# Patient Record
Sex: Female | Born: 1975 | Race: White | Hispanic: No | Smoking: Never smoker
Health system: Southern US, Community
[De-identification: ages and names within clinical notes are randomized; demographics above are authoritative.]

## PROBLEM LIST (undated history)

## (undated) DIAGNOSIS — H3530 Unspecified macular degeneration: Secondary | ICD-10-CM

## (undated) DIAGNOSIS — K589 Irritable bowel syndrome without diarrhea: Secondary | ICD-10-CM

## (undated) DIAGNOSIS — N251 Nephrogenic diabetes insipidus: Secondary | ICD-10-CM

## (undated) DIAGNOSIS — F419 Anxiety disorder, unspecified: Secondary | ICD-10-CM

## (undated) DIAGNOSIS — E876 Hypokalemia: Secondary | ICD-10-CM

## (undated) DIAGNOSIS — N189 Chronic kidney disease, unspecified: Secondary | ICD-10-CM

## (undated) DIAGNOSIS — C801 Malignant (primary) neoplasm, unspecified: Secondary | ICD-10-CM

## (undated) DIAGNOSIS — H5440 Blindness, one eye, unspecified eye: Secondary | ICD-10-CM

## (undated) DIAGNOSIS — M549 Dorsalgia, unspecified: Secondary | ICD-10-CM

## (undated) DIAGNOSIS — R519 Headache, unspecified: Secondary | ICD-10-CM

## (undated) DIAGNOSIS — Z940 Kidney transplant status: Secondary | ICD-10-CM

## (undated) DIAGNOSIS — C4442 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: Secondary | ICD-10-CM

## (undated) DIAGNOSIS — N904 Leukoplakia of vulva: Secondary | ICD-10-CM

## (undated) DIAGNOSIS — K2900 Acute gastritis without bleeding: Secondary | ICD-10-CM

## (undated) DIAGNOSIS — F32A Depression, unspecified: Secondary | ICD-10-CM

## (undated) DIAGNOSIS — I10 Essential (primary) hypertension: Secondary | ICD-10-CM

## (undated) DIAGNOSIS — Z992 Dependence on renal dialysis: Secondary | ICD-10-CM

## (undated) DIAGNOSIS — K859 Acute pancreatitis without necrosis or infection, unspecified: Secondary | ICD-10-CM

## (undated) DIAGNOSIS — R5383 Other fatigue: Secondary | ICD-10-CM

## (undated) DIAGNOSIS — D631 Anemia in chronic kidney disease: Secondary | ICD-10-CM

## (undated) DIAGNOSIS — M109 Gout, unspecified: Secondary | ICD-10-CM

## (undated) DIAGNOSIS — D649 Anemia, unspecified: Secondary | ICD-10-CM

## (undated) DIAGNOSIS — E872 Acidosis, unspecified: Secondary | ICD-10-CM

## (undated) DIAGNOSIS — R933 Abnormal findings on diagnostic imaging of other parts of digestive tract: Secondary | ICD-10-CM

## (undated) DIAGNOSIS — E7800 Pure hypercholesterolemia, unspecified: Secondary | ICD-10-CM

## (undated) DIAGNOSIS — E785 Hyperlipidemia, unspecified: Secondary | ICD-10-CM

## (undated) DIAGNOSIS — R600 Localized edema: Secondary | ICD-10-CM

## (undated) DIAGNOSIS — N390 Urinary tract infection, site not specified: Secondary | ICD-10-CM

## (undated) DIAGNOSIS — K6282 Dysplasia of anus: Secondary | ICD-10-CM

## (undated) DIAGNOSIS — A770 Spotted fever due to Rickettsia rickettsii: Secondary | ICD-10-CM

## (undated) DIAGNOSIS — R002 Palpitations: Secondary | ICD-10-CM

## (undated) DIAGNOSIS — A630 Anogenital (venereal) warts: Secondary | ICD-10-CM

## (undated) DIAGNOSIS — R300 Dysuria: Secondary | ICD-10-CM

## (undated) DIAGNOSIS — N2581 Secondary hyperparathyroidism of renal origin: Secondary | ICD-10-CM

## (undated) DIAGNOSIS — D0730 Carcinoma in situ of unspecified female genital organs: Secondary | ICD-10-CM

## (undated) HISTORY — DX: Kidney transplant status: Z94.0

## (undated) HISTORY — PX: REMOVAL OF A DIALYSIS CATHETER: SHX6053

## (undated) HISTORY — DX: Chronic kidney disease, unspecified: N18.9

## (undated) HISTORY — PX: OTHER SURGICAL HISTORY: SHX169

## (undated) HISTORY — DX: Secondary hyperparathyroidism of renal origin: N25.81

## (undated) HISTORY — DX: Gout, unspecified: M10.9

## (undated) HISTORY — PX: KIDNEY TRANSPLANT: SHX239

## (undated) HISTORY — PX: UPPER GASTROINTESTINAL ENDOSCOPY: SHX188

## (undated) HISTORY — DX: Hypomagnesemia: E83.42

## (undated) HISTORY — DX: Anxiety disorder, unspecified: F41.9

## (undated) HISTORY — DX: Pure hypercholesterolemia, unspecified: E78.00

## (undated) HISTORY — DX: Spotted fever due to Rickettsia rickettsii: A77.0

## (undated) HISTORY — DX: Other fatigue: R53.83

## (undated) HISTORY — DX: Hyperlipidemia, unspecified: E78.5

## (undated) HISTORY — DX: Urinary tract infection, site not specified: N39.0

## (undated) HISTORY — PX: WISDOM TOOTH EXTRACTION: SHX21

## (undated) HISTORY — DX: Localized edema: R60.0

## (undated) HISTORY — PX: INSERTION OF DIALYSIS CATHETER: SHX1324

## (undated) HISTORY — DX: Acidosis, unspecified: E87.20

## (undated) HISTORY — PX: LIVER BIOPSY: SHX301

## (undated) HISTORY — DX: Essential (primary) hypertension: I10

## (undated) HISTORY — DX: 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: C44.42

## (undated) HISTORY — DX: End stage renal disease: N18.6

## (undated) HISTORY — DX: Hypercalcemia: E83.52

## (undated) HISTORY — DX: Acute pancreatitis without necrosis or infection, unspecified: K85.90

## (undated) HISTORY — DX: Dorsalgia, unspecified: M54.9

## (undated) HISTORY — DX: Acute gastritis without bleeding: K29.00

## (undated) HISTORY — DX: Depression, unspecified: F32.A

## (undated) HISTORY — PX: HEMORRHOID SURGERY: SHX153

## (undated) HISTORY — DX: Malignant (primary) neoplasm, unspecified: C80.1

## (undated) HISTORY — PX: TUBAL LIGATION: SHX77

## (undated) HISTORY — DX: Nephrogenic diabetes insipidus: N25.1

## (undated) HISTORY — PX: NECK SURGERY: SHX720

## (undated) HISTORY — DX: Anemia, unspecified: D64.9

## (undated) HISTORY — DX: Palpitations: R00.2

## (undated) HISTORY — DX: Anemia in chronic kidney disease: D63.1

## (undated) HISTORY — DX: Irritable bowel syndrome, unspecified: K58.9

## (undated) HISTORY — DX: Carcinoma in situ of unspecified female genital organs: D07.30

## (undated) HISTORY — DX: Dysuria: R30.0

## (undated) HISTORY — DX: Abnormal findings on diagnostic imaging of other parts of digestive tract: R93.3

## (undated) HISTORY — DX: Dependence on renal dialysis: Z99.2

## (undated) HISTORY — DX: Dysplasia of anus: K62.82

## (undated) HISTORY — DX: Anogenital (venereal) warts: A63.0

## (undated) HISTORY — DX: Leukoplakia of vulva: N90.4

## (undated) HISTORY — DX: Hypokalemia: E87.6

---

## 1999-11-29 ENCOUNTER — Ambulatory Visit: Admission: RE | Admit: 1999-11-29 | Discharge: 1999-11-29 | Payer: Self-pay | Admitting: Gynecology

## 2000-01-16 ENCOUNTER — Ambulatory Visit (HOSPITAL_COMMUNITY): Admission: RE | Admit: 2000-01-16 | Discharge: 2000-01-16 | Payer: Self-pay

## 2000-03-22 ENCOUNTER — Ambulatory Visit (HOSPITAL_COMMUNITY): Admission: RE | Admit: 2000-03-22 | Discharge: 2000-03-22 | Payer: Self-pay | Admitting: Nephrology

## 2000-03-29 ENCOUNTER — Encounter: Payer: Self-pay | Admitting: Nephrology

## 2000-03-29 ENCOUNTER — Ambulatory Visit (HOSPITAL_COMMUNITY): Admission: RE | Admit: 2000-03-29 | Discharge: 2000-03-29 | Payer: Self-pay | Admitting: Nephrology

## 2000-04-05 ENCOUNTER — Encounter: Payer: Self-pay | Admitting: Nephrology

## 2000-04-05 ENCOUNTER — Ambulatory Visit (HOSPITAL_COMMUNITY): Admission: RE | Admit: 2000-04-05 | Discharge: 2000-04-05 | Payer: Self-pay | Admitting: Nephrology

## 2000-04-08 ENCOUNTER — Encounter: Payer: Self-pay | Admitting: Urology

## 2000-04-09 ENCOUNTER — Inpatient Hospital Stay (HOSPITAL_COMMUNITY): Admission: RE | Admit: 2000-04-09 | Discharge: 2000-04-13 | Payer: Self-pay | Admitting: Urology

## 2000-04-10 ENCOUNTER — Encounter: Payer: Self-pay | Admitting: Nephrology

## 2000-05-14 ENCOUNTER — Ambulatory Visit: Admission: RE | Admit: 2000-05-14 | Discharge: 2000-05-14 | Payer: Self-pay | Admitting: Gynecology

## 2000-05-23 ENCOUNTER — Ambulatory Visit (HOSPITAL_COMMUNITY): Admission: RE | Admit: 2000-05-23 | Discharge: 2000-05-23 | Payer: Self-pay | Admitting: Nephrology

## 2000-06-24 ENCOUNTER — Emergency Department (HOSPITAL_COMMUNITY): Admission: EM | Admit: 2000-06-24 | Discharge: 2000-06-24 | Payer: Self-pay | Admitting: *Deleted

## 2000-07-03 ENCOUNTER — Ambulatory Visit (HOSPITAL_COMMUNITY): Admission: RE | Admit: 2000-07-03 | Discharge: 2000-07-04 | Payer: Self-pay | Admitting: General Surgery

## 2000-08-21 ENCOUNTER — Ambulatory Visit (HOSPITAL_COMMUNITY): Admission: RE | Admit: 2000-08-21 | Discharge: 2000-08-21 | Payer: Self-pay | Admitting: Nephrology

## 2000-09-05 ENCOUNTER — Emergency Department (HOSPITAL_COMMUNITY): Admission: EM | Admit: 2000-09-05 | Discharge: 2000-09-05 | Payer: Self-pay | Admitting: Emergency Medicine

## 2001-08-12 ENCOUNTER — Emergency Department (HOSPITAL_COMMUNITY): Admission: EM | Admit: 2001-08-12 | Discharge: 2001-08-12 | Payer: Self-pay | Admitting: General Surgery

## 2001-08-13 ENCOUNTER — Inpatient Hospital Stay (HOSPITAL_COMMUNITY): Admission: RE | Admit: 2001-08-13 | Discharge: 2001-08-15 | Payer: Self-pay | Admitting: Nephrology

## 2001-08-15 ENCOUNTER — Encounter: Payer: Self-pay | Admitting: Nephrology

## 2001-08-18 ENCOUNTER — Emergency Department (HOSPITAL_COMMUNITY): Admission: EM | Admit: 2001-08-18 | Discharge: 2001-08-18 | Payer: Self-pay | Admitting: Emergency Medicine

## 2001-09-23 ENCOUNTER — Observation Stay (HOSPITAL_COMMUNITY): Admission: EM | Admit: 2001-09-23 | Discharge: 2001-09-23 | Payer: Self-pay | Admitting: *Deleted

## 2001-11-10 ENCOUNTER — Encounter: Payer: Self-pay | Admitting: General Surgery

## 2001-11-12 ENCOUNTER — Inpatient Hospital Stay (HOSPITAL_COMMUNITY): Admission: RE | Admit: 2001-11-12 | Discharge: 2001-11-12 | Payer: Self-pay | Admitting: General Surgery

## 2001-12-04 ENCOUNTER — Ambulatory Visit (HOSPITAL_COMMUNITY): Admission: RE | Admit: 2001-12-04 | Discharge: 2001-12-04 | Payer: Self-pay | Admitting: General Surgery

## 2002-09-03 HISTORY — PX: PARATHYROIDECTOMY: SHX19

## 2003-01-07 ENCOUNTER — Encounter: Payer: Self-pay | Admitting: Surgery

## 2003-01-12 ENCOUNTER — Inpatient Hospital Stay (HOSPITAL_COMMUNITY): Admission: RE | Admit: 2003-01-12 | Discharge: 2003-01-15 | Payer: Self-pay | Admitting: Surgery

## 2003-10-06 ENCOUNTER — Encounter: Admission: RE | Admit: 2003-10-06 | Discharge: 2003-10-06 | Payer: Self-pay | Admitting: General Surgery

## 2003-10-08 ENCOUNTER — Encounter: Admission: RE | Admit: 2003-10-08 | Discharge: 2003-10-08 | Payer: Self-pay | Admitting: General Surgery

## 2003-10-11 ENCOUNTER — Ambulatory Visit (HOSPITAL_COMMUNITY): Admission: RE | Admit: 2003-10-11 | Discharge: 2003-10-11 | Payer: Self-pay | Admitting: General Surgery

## 2003-10-11 ENCOUNTER — Ambulatory Visit (HOSPITAL_BASED_OUTPATIENT_CLINIC_OR_DEPARTMENT_OTHER): Admission: RE | Admit: 2003-10-11 | Discharge: 2003-10-11 | Payer: Self-pay | Admitting: General Surgery

## 2004-12-01 ENCOUNTER — Ambulatory Visit (HOSPITAL_COMMUNITY): Admission: RE | Admit: 2004-12-01 | Discharge: 2004-12-01 | Payer: Self-pay | Admitting: Dentistry

## 2004-12-01 ENCOUNTER — Ambulatory Visit (HOSPITAL_BASED_OUTPATIENT_CLINIC_OR_DEPARTMENT_OTHER): Admission: RE | Admit: 2004-12-01 | Discharge: 2004-12-01 | Payer: Self-pay | Admitting: Dentistry

## 2005-09-25 ENCOUNTER — Encounter: Admission: RE | Admit: 2005-09-25 | Discharge: 2005-09-25 | Payer: Self-pay | Admitting: Nephrology

## 2005-11-25 ENCOUNTER — Ambulatory Visit: Payer: Self-pay | Admitting: Gastroenterology

## 2005-11-25 ENCOUNTER — Inpatient Hospital Stay (HOSPITAL_COMMUNITY): Admission: EM | Admit: 2005-11-25 | Discharge: 2005-11-26 | Payer: Self-pay | Admitting: Nephrology

## 2005-11-28 ENCOUNTER — Ambulatory Visit: Payer: Self-pay | Admitting: Gastroenterology

## 2005-11-28 HISTORY — PX: COLONOSCOPY: SHX174

## 2005-12-18 ENCOUNTER — Ambulatory Visit: Payer: Self-pay | Admitting: Gastroenterology

## 2005-12-21 ENCOUNTER — Ambulatory Visit: Payer: Self-pay | Admitting: Gastroenterology

## 2005-12-27 ENCOUNTER — Ambulatory Visit (HOSPITAL_COMMUNITY): Admission: RE | Admit: 2005-12-27 | Discharge: 2005-12-27 | Payer: Self-pay | Admitting: Gastroenterology

## 2006-03-11 ENCOUNTER — Inpatient Hospital Stay (HOSPITAL_COMMUNITY): Admission: EM | Admit: 2006-03-11 | Discharge: 2006-03-13 | Payer: Self-pay | Admitting: *Deleted

## 2006-03-25 ENCOUNTER — Emergency Department (HOSPITAL_COMMUNITY): Admission: EM | Admit: 2006-03-25 | Discharge: 2006-03-25 | Payer: Self-pay | Admitting: Emergency Medicine

## 2006-03-26 ENCOUNTER — Inpatient Hospital Stay (HOSPITAL_COMMUNITY): Admission: EM | Admit: 2006-03-26 | Discharge: 2006-03-27 | Payer: Self-pay | Admitting: Pediatrics

## 2006-05-05 ENCOUNTER — Emergency Department (HOSPITAL_COMMUNITY): Admission: EM | Admit: 2006-05-05 | Discharge: 2006-05-05 | Payer: Self-pay | Admitting: Emergency Medicine

## 2006-05-13 ENCOUNTER — Ambulatory Visit (HOSPITAL_COMMUNITY): Admission: RE | Admit: 2006-05-13 | Discharge: 2006-05-13 | Payer: Self-pay | Admitting: General Surgery

## 2006-08-16 ENCOUNTER — Emergency Department (HOSPITAL_COMMUNITY): Admission: EM | Admit: 2006-08-16 | Discharge: 2006-08-16 | Payer: Self-pay | Admitting: Emergency Medicine

## 2006-09-20 ENCOUNTER — Emergency Department (HOSPITAL_COMMUNITY): Admission: EM | Admit: 2006-09-20 | Discharge: 2006-09-20 | Payer: Self-pay | Admitting: Emergency Medicine

## 2006-09-23 ENCOUNTER — Ambulatory Visit (HOSPITAL_COMMUNITY): Admission: RE | Admit: 2006-09-23 | Discharge: 2006-09-23 | Payer: Self-pay | Admitting: Vascular Surgery

## 2006-10-06 ENCOUNTER — Inpatient Hospital Stay (HOSPITAL_COMMUNITY): Admission: EM | Admit: 2006-10-06 | Discharge: 2006-10-06 | Payer: Self-pay | Admitting: Emergency Medicine

## 2010-09-24 ENCOUNTER — Encounter: Payer: Self-pay | Admitting: Gastroenterology

## 2011-06-13 ENCOUNTER — Other Ambulatory Visit (HOSPITAL_COMMUNITY)
Admission: RE | Admit: 2011-06-13 | Discharge: 2011-06-13 | Disposition: A | Discharge status: Home / Self Care | Payer: Medicare Other | Source: Ambulatory Visit | Attending: Gynecology | Admitting: Gynecology

## 2011-06-13 ENCOUNTER — Ambulatory Visit: Payer: Medicare Other | Attending: Gynecology | Admitting: Gynecology

## 2011-06-13 ENCOUNTER — Other Ambulatory Visit: Payer: Self-pay | Admitting: Gynecology

## 2011-06-13 DIAGNOSIS — Z8540 Personal history of malignant neoplasm of unspecified female genital organ: Secondary | ICD-10-CM | POA: Insufficient documentation

## 2011-06-13 DIAGNOSIS — N9089 Other specified noninflammatory disorders of vulva and perineum: Secondary | ICD-10-CM | POA: Insufficient documentation

## 2011-06-13 DIAGNOSIS — Z79899 Other long term (current) drug therapy: Secondary | ICD-10-CM | POA: Insufficient documentation

## 2011-06-15 ENCOUNTER — Other Ambulatory Visit: Payer: Self-pay | Admitting: Gynecology

## 2011-06-15 ENCOUNTER — Encounter (HOSPITAL_COMMUNITY): Payer: Medicare Other

## 2011-06-15 LAB — DIFFERENTIAL
Basophils Relative: 0 % (ref 0–1)
Eosinophils Absolute: 0.1 10*3/uL (ref 0.0–0.7)
Neutro Abs: 8.3 10*3/uL — ABNORMAL HIGH (ref 1.7–7.7)
Neutrophils Relative %: 83 % — ABNORMAL HIGH (ref 43–77)

## 2011-06-15 LAB — CBC
HCT: 37.1 % (ref 36.0–46.0)
Hemoglobin: 11.9 g/dL — ABNORMAL LOW (ref 12.0–15.0)
MCHC: 32.1 g/dL (ref 30.0–36.0)
RBC: 4.18 MIL/uL (ref 3.87–5.11)
WBC: 10.1 10*3/uL (ref 4.0–10.5)

## 2011-06-15 LAB — BASIC METABOLIC PANEL
Calcium: 10.4 mg/dL (ref 8.4–10.5)
GFR calc Af Amer: >90 mL/min (ref >90)
Potassium: 4 mEq/L (ref 3.5–5.1)
Sodium: 136 mEq/L (ref 135–145)

## 2011-06-15 LAB — HCG, SERUM, QUALITATIVE: Preg, Serum: NEGATIVE

## 2011-06-15 NOTE — Consult Note (Signed)
Ariel [name], Ariel [name]           ACCOUNT NO.:  [id number]  MEDICAL RECORD NO.:  [id number]  LOCATION:  GYN                          FACILITY:  [facility]  PHYSICIAN:  Marti Sleigh, M.D.DATE OF BIRTH:  1976/08/12  DATE OF CONSULTATION:  06/13/2011 DATE OF DISCHARGE:                                CONSULTATION   CHIEF COMPLAINT:  Severe dysplasia of the vulva.  HISTORY OF PRESENT ILLNESS:  A 35 year old white female seen in consultation requested by Dr. Marin Roberts regarding newly diagnosed severe dysplasia of the vulva.  The patient has had a past chronic history of condylomata, but recently was found to have an asymptomatic lesion on the vulva, which Dr. Marin Roberts biopsied.  The patient specifically denies any pruritus.  She has recently been using Aldara for treatment of vulvar and perianal condylomata and has been tolerating that well.  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  She has had renal transplants in 1995 and again in 2008 and she has also has a significant amount of time requiring dialysis.  The patient also has hypercholesterolemia and congenital nephrogenic diabetes insipidus.  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  FAMILY HISTORY:  Negative for gynecologic, breast, or colon cancer.  REVIEW OF SYSTEMS:  Ten-point comprehensive review of systems negative except as noted above.  CURRENT MEDICATIONS:  Prograf, Myfortic, simvastatin, Zoloft, magnesium, buspirone, prednisone, alprazolam, Aldara.  PHYSICAL EXAMINATION:  VITAL SIGNS:  Weight 148 pounds.  Remainder of vital signs are in the patient's chart. GENERAL:  Patient is a healthy, pleasant,  white female with a moonlike facies associated with the use of prednisone. HEENT:  Negative. NECK:  Supple without thyromegaly.  There is no supraclavicular or inguinal adenopathy. ABDOMEN:  Obese, soft, nontender.  No mass, organomegaly, ascites, or hernias noted. PELVIC EXAM:  EGBUS showed some condylomata on the left side of the clitoris and along the labia minora anteriorly.  In the mid portion of the left labia majora, there is a slightly raised up plaque, measured approximately 2 cm in diameter.  Ascetic acid is applied to this area for several minutes and then removed and there was no evidence of white epithelium and it is remarkable that this does not demarcated itself very clearly.  Vagina is normal.  Cervix is normal.  Pap smears have obtained.  Bimanual exam reveals an anterior uterus, normal shape, size, and consistency.  Adnexa without masses.  IMPRESSION:  Carcinoma in situ of the vulva.  I had a lengthy discussion with the patient and her mother regarding treatment options, which could include excision, laser vaporization, or use of Efudex cream.  After discussing pros and cons of each, we decided to go ahead with a wide local excision and primary closure.  The risks of surgery were outlined as well as the risk of recurrence, this was especially emphasized given the fact the patient is on immunosuppressive drugs.  All of their questions were answered.  We will schedule surgery for  the next week.     Marti Sleigh, M.D.     DC/MEDQ  D:  06/13/2011  T:  06/13/2011  Job:  [id number]  cc:   Armandina Stammer, MD Fax: [phone]  Caswell Corwin, R.N. [phone], [place] [address]  Electronically Signed by Marti Sleigh M.D. on 06/15/2011 11:10:18 AM

## 2011-06-19 ENCOUNTER — Other Ambulatory Visit: Payer: Self-pay | Admitting: Gynecology

## 2011-06-19 ENCOUNTER — Ambulatory Visit (HOSPITAL_COMMUNITY)
Admission: RE | Admit: 2011-06-19 | Discharge: 2011-06-19 | Disposition: A | Discharge status: Home / Self Care | Payer: Medicare Other | Source: Ambulatory Visit | Attending: Gynecology | Admitting: Gynecology

## 2011-06-19 DIAGNOSIS — D071 Carcinoma in situ of vulva: Secondary | ICD-10-CM | POA: Insufficient documentation

## 2011-06-19 DIAGNOSIS — F411 Generalized anxiety disorder: Secondary | ICD-10-CM | POA: Insufficient documentation

## 2011-06-19 DIAGNOSIS — Z01812 Encounter for preprocedural laboratory examination: Secondary | ICD-10-CM | POA: Insufficient documentation

## 2011-06-19 DIAGNOSIS — IMO0002 Reserved for concepts with insufficient information to code with codable children: Secondary | ICD-10-CM | POA: Insufficient documentation

## 2011-06-19 HISTORY — PX: SIMPLE VULVECTOMY: SUR1442

## 2011-06-25 NOTE — Op Note (Signed)
  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  PROCEDURE:  Partial simple vulvectomy (left).  SURGEON:  Marti Sleigh, M.D.  ASSISTANT:  Caswell Corwin, R.N.  ANESTHESIA:  General, LMA.  ESTIMATED BLOOD LOSS:  Minimal.  SURGICAL FINDINGS:  Examination under anesthesia revealed some hyperpigmentation of the left vulva as well as a biopsy site, which previously documented the carcinoma in situ.  After applying acetic acid, the biopsy site appeared somewhat more white.  There were no other focal lesions, although there was some white epithelium on the medial side of the labia minora.  PROCEDURE:  Patient brought to the operating room and after satisfactory attainment, general anesthesia, was placed in modified lithotomy position in [facility].  Perineum and vulva and vagina were prepped with Betadine.  The patient was draped.  Acetic acid was applied to the vulva with the above-noted findings.  Creating an elliptical incision with approximately 1 cm margin around the obvious lesion, the large portion of the left labia majora and minora were removed.  We made every attempt to remove the skin only.  Hemostasis achieved with cautery.  The skin edges were then reapproximated with a running subcuticular suture of 3-0 Vicryl.  The incision was reinforced with a few interrupted 3-0 Vicryl sutures.  Hemostasis was excellent.  An ice pack was applied.  Patient was awakened from anesthesia and taken to the recovery room in satisfactory condition. Sponge, needle, and  instrument counts were correct x2.     Marti Sleigh, M.D.     DC/MEDQ  D:  06/19/2011  T:  06/20/2011  Job:  [id number]  cc:   Caswell Corwin, R.N. [address], [place] [address]  Armandina Stammer, MD Fax: [phone]  Electronically Signed by Marti Sleigh M.D. on 06/25/2011 04:17:27 PM

## 2011-07-04 ENCOUNTER — Ambulatory Visit: Payer: Medicare Other | Admitting: Gynecology

## 2011-07-04 ENCOUNTER — Ambulatory Visit: Payer: Medicare Other | Attending: Gynecology | Admitting: Gynecology

## 2011-07-04 DIAGNOSIS — T8131XA Disruption of external operation (surgical) wound, not elsewhere classified, initial encounter: Secondary | ICD-10-CM | POA: Insufficient documentation

## 2011-07-04 DIAGNOSIS — Y838 Other surgical procedures as the cause of abnormal reaction of the patient, or of later complication, without mention of misadventure at the time of the procedure: Secondary | ICD-10-CM | POA: Insufficient documentation

## 2011-07-04 DIAGNOSIS — Z09 Encounter for follow-up examination after completed treatment for conditions other than malignant neoplasm: Secondary | ICD-10-CM | POA: Insufficient documentation

## 2011-07-06 NOTE — Consult Note (Signed)
  Ariel [name], [name]           ACCOUNT NO.:  [id number]  MEDICAL RECORD NO.:  [id number]  LOCATION:  GYN                          FACILITY:  [facility]  PHYSICIAN:  Marti Sleigh, M.D.DATE OF BIRTH:  [birth year]  DATE OF CONSULTATION:  07/04/2011 DATE OF DISCHARGE:                                CONSULTATION   CHIEF COMPLAINT:  Postoperative followup.  INTERVAL HISTORY:  Patient underwent a partial simple vulvectomy of the left vulva on October 16th.  Final pathology showed that she had severe dysplasia with negative margins.  Unfortunately, because of the size of the lesion, a primary closure has pulled through and the patient is currently using sitz baths and applying Neosporin to the area. Otherwise, she is not having any fever, chills, or any other problems.  PHYSICAL EXAMINATION:  VITAL SIGNS:  Weight 147 pounds, blood pressure 90/68.  PELVIC EXAM:  EGBUS shows that the left vulva has a clean granulating area where the sutures were pulled through, measuring approximately 6 x 4 cm.  There is no evidence of erythema or infection.  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  Wound breakdown.  PLAN:  The patient continues b.i.d. sitz baths, applying Neosporin, and have her return to see me in 1 month or p.r.n.     Marti Sleigh, M.D.     DC/MEDQ  D:  07/04/2011  T:  07/04/2011  Job:  [id number]  cc:   Caswell Corwin, R.N. [address], [place] [address]  Ariel Stammer, MD Fax: [phone]  Electronically Signed by Marti Sleigh M.D. on 07/06/2011 09:25:10 AM

## 2011-08-30 ENCOUNTER — Encounter: Payer: Self-pay | Admitting: Gynecologic Oncology

## 2011-08-31 ENCOUNTER — Encounter: Payer: Self-pay | Admitting: Gynecology

## 2011-08-31 ENCOUNTER — Ambulatory Visit: Payer: Medicare Other | Attending: Gynecology | Admitting: Gynecology

## 2011-08-31 VITALS — BP 110/60 | HR 68 | Temp 98.3°F | Resp 16 | Ht 59.0 in | Wt 143.4 lb

## 2011-08-31 DIAGNOSIS — D071 Carcinoma in situ of vulva: Secondary | ICD-10-CM | POA: Insufficient documentation

## 2011-08-31 DIAGNOSIS — D4959 Neoplasm of unspecified behavior of other genitourinary organ: Secondary | ICD-10-CM

## 2011-08-31 DIAGNOSIS — N189 Chronic kidney disease, unspecified: Secondary | ICD-10-CM | POA: Insufficient documentation

## 2011-08-31 DIAGNOSIS — A630 Anogenital (venereal) warts: Secondary | ICD-10-CM | POA: Insufficient documentation

## 2011-08-31 DIAGNOSIS — Z940 Kidney transplant status: Secondary | ICD-10-CM | POA: Insufficient documentation

## 2011-08-31 DIAGNOSIS — E7800 Pure hypercholesterolemia, unspecified: Secondary | ICD-10-CM | POA: Insufficient documentation

## 2011-08-31 DIAGNOSIS — Z79899 Other long term (current) drug therapy: Secondary | ICD-10-CM | POA: Insufficient documentation

## 2011-08-31 NOTE — Progress Notes (Signed)
Consult Note: Gyn-Onc   Ariel [name] 35 y.o. female  Chief Complaint  Patient presents with  . Vulvar Cancer    Follow up    Interval History: Since her last visit she is done well. The area of wound separation is now completely healed she only notices that she has a new condylomata on her left anterior labia minora as well as some around her anus. She's using Aldara to treat this.  HPI:A 35 year old white female seen in  consultation requested by Dr. Marin Roberts regarding newly diagnosed severe  dysplasia of the vulva.  The patient has had a past chronic history of condylomata, but recently  was found to have an asymptomatic lesion on the vulva, which Dr.  Marin Roberts biopsied. The patient specifically denies any pruritus. She  has recently been using Aldara for treatment of vulvar and perianal  condylomata and has been tolerating that well. 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  Margins were negative.   Allergies  Allergen Reactions  . Adhesive (Tape) Rash  . Bactrim Swelling and Rash    Past Medical History  Diagnosis Date  . Hypercholesterolemia   . 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 Vulvar dystrophy   . History of renal transplant   . Nephrogenic diabetes insipidus     congenital  . Renal failure, chronic     Dialysis in the past  . 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 Kidney transplant 1995, 2008  . Tubal ligation   . Av fistula placement   . Simple vulvectomy 06/19/2011    Partial simple left  . 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 ALPRAZolam (XANAX) 0.25 MG tablet Take 0.25 mg by mouth at bedtime as needed.        . imiquimod (ALDARA) 5 % cream Apply topically. Apply to affected area in the evening on Tues, Thurs., and  Sat. Wash off in the morning       . Magnesium 200 MG TABS Take 1 tablet by mouth daily.        . mycophenolate (MYFORTIC) 360 MG TBEC Take 360 mg by mouth 2 (two) times daily.        . predniSONE (DELTASONE) 5 MG tablet Take 5 mg by mouth daily.        . sertraline (ZOLOFT) 50 MG tablet Take 50 mg by mouth daily.        . simvastatin (ZOCOR) 10 MG tablet Take 10 mg by mouth daily.       . tacrolimus (PROGRAF) 1 MG capsule Take 4 mg by mouth 2 (two) times daily.        . busPIRone (BUSPAR) 5 MG tablet Take 5 mg by mouth 3 (three) times daily.          History   Social History  . Marital Status: Divorced    Spouse Name: N/A    Number of Children: N/A  . Years of Education: N/A   Occupational History  . Not on file.   Social History Main Topics  . Smoking status: Never Smoker   . Smokeless tobacco: Not on file  . Alcohol Use: No  . Drug Use: No  . Sexually Active:    Other Topics  Concern  . Not on file   Social History Narrative  . No narrative on file    Family History  Problem Relation Age of Onset  . Hypertension Mother     Review of Systems: 10 point review of systems is negative except as noted above  Vitals: Blood pressure 110/60, pulse 68, temperature 98.3 F (36.8 C), resp. rate 16, height 4\' 11"  (1.499 m), weight 143 lb 6.4 oz (65.046 kg), last menstrual period 07/09/2011.  Physical Exam: In general is a healthy white female no acute distress  HEENT is negative  Neck supple without thyromegaly.  There is no supra-auricular or inguinal adenopathy  The abdomen is soft nontender no masses organomegaly ascites or hernias are noted.  Pelvic exam EGBUS shows that the open wound in the perineum has completely healed. There are no lesions that appear to be carcinoma in situ. On the other hand, there is a 5-6 mm exophytic condyloma on the anterior aspect of the left labia minora. In addition there are several verrucous appearing condyloma around the  anus. Assessment/Plan:  Carcinoma in situ the vulva status post wide local excision with negative margins. No evidence of recurrent disease.  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  The patient will return to see Dr. Marin Roberts in approximately one month for reassessment of the condylomata. She's currently using Aldara to treat the condylomata. Treatment options were discussed with the patient including use of topical Efudex or laser vaporization of the condylomata should Aldara not be effective.   Alvino Chapel, MD 08/31/2011, 2:05 PM                         Consult Note: Gyn-Onc   Ariel [name] 35 y.o. female  Chief Complaint  Patient presents with  . Vulvar Cancer    Follow up    Interval History:   HPI:  Allergies  Allergen Reactions  . Adhesive (Tape) Rash  . Bactrim Swelling and Rash    Past Medical History  Diagnosis Date  . Hypercholesterolemia   . 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 Vulvar dystrophy   . History of renal transplant   . Nephrogenic diabetes insipidus     congenital  . Renal failure, chronic     Dialysis in the past  . 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 Kidney transplant 1995, 2008  . Tubal ligation   . Av fistula placement   . Simple vulvectomy 06/19/2011    Partial simple left  . 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 ALPRAZolam (XANAX) 0.25 MG tablet Take 0.25 mg by mouth at bedtime as needed.        . imiquimod (ALDARA) 5 % cream Apply topically. Apply to affected area in the evening on Tues, Thurs., and Sat. Wash off in the morning       . Magnesium 200 MG TABS Take 1 tablet by mouth daily.        . mycophenolate (MYFORTIC) 360 MG TBEC Take 360 mg by mouth 2 (two) times daily.        .  predniSONE (DELTASONE) 5 MG tablet Take 5 mg by mouth daily.        . sertraline (ZOLOFT) 50 MG tablet  Take 50 mg by mouth daily.        . simvastatin (ZOCOR) 10 MG tablet Take 10 mg by mouth daily.       . tacrolimus (PROGRAF) 1 MG capsule Take 4 mg by mouth 2 (two) times daily.        . busPIRone (BUSPAR) 5 MG tablet Take 5 mg by mouth 3 (three) times daily.          History   Social History  . Marital Status: Divorced    Spouse Name: N/A    Number of Children: N/A  . Years of Education: N/A   Occupational History  . Not on file.   Social History Main Topics  . Smoking status: Never Smoker   . Smokeless tobacco: Not on file  . Alcohol Use: No  . Drug Use: No  . Sexually Active:    Other Topics Concern  . Not on file   Social History Narrative  . No narrative on file    Family History  Problem Relation Age of Onset  . Hypertension Mother     Review of Systems:  Vitals: Blood pressure 110/60, pulse 68, temperature 98.3 F (36.8 C), resp. rate 16, height 4\' 11"  (1.499 m), weight 143 lb 6.4 oz (65.046 kg), last menstrual period 07/09/2011.  Physical Exam:  Assessment/Plan:   Alvino Chapel, MD 08/31/2011, 2:05 PM

## 2011-08-31 NOTE — Patient Instructions (Signed)
Return to the care of Dr. Marin Roberts for followup of carcinoma in situ of the vulva and management of vulvar condyloma

## 2011-09-18 DIAGNOSIS — Z79899 Other long term (current) drug therapy: Secondary | ICD-10-CM | POA: Diagnosis not present

## 2011-09-18 DIAGNOSIS — Z940 Kidney transplant status: Secondary | ICD-10-CM | POA: Diagnosis not present

## 2011-09-19 DIAGNOSIS — D649 Anemia, unspecified: Secondary | ICD-10-CM | POA: Diagnosis not present

## 2011-09-19 DIAGNOSIS — E785 Hyperlipidemia, unspecified: Secondary | ICD-10-CM | POA: Diagnosis not present

## 2011-09-19 DIAGNOSIS — Z79899 Other long term (current) drug therapy: Secondary | ICD-10-CM | POA: Diagnosis not present

## 2011-09-19 DIAGNOSIS — Z940 Kidney transplant status: Secondary | ICD-10-CM | POA: Diagnosis not present

## 2011-09-24 DIAGNOSIS — Z79899 Other long term (current) drug therapy: Secondary | ICD-10-CM | POA: Diagnosis not present

## 2011-09-24 DIAGNOSIS — D631 Anemia in chronic kidney disease: Secondary | ICD-10-CM | POA: Diagnosis not present

## 2011-09-24 DIAGNOSIS — N183 Chronic kidney disease, stage 3 unspecified: Secondary | ICD-10-CM | POA: Diagnosis not present

## 2011-09-24 DIAGNOSIS — IMO0002 Reserved for concepts with insufficient information to code with codable children: Secondary | ICD-10-CM | POA: Diagnosis not present

## 2011-10-22 DIAGNOSIS — D631 Anemia in chronic kidney disease: Secondary | ICD-10-CM | POA: Diagnosis not present

## 2011-10-22 DIAGNOSIS — N183 Chronic kidney disease, stage 3 unspecified: Secondary | ICD-10-CM | POA: Diagnosis not present

## 2011-11-20 DIAGNOSIS — D631 Anemia in chronic kidney disease: Secondary | ICD-10-CM | POA: Diagnosis not present

## 2011-11-20 DIAGNOSIS — N183 Chronic kidney disease, stage 3 unspecified: Secondary | ICD-10-CM | POA: Diagnosis not present

## 2011-11-23 ENCOUNTER — Telehealth: Payer: Self-pay | Admitting: Gynecologic Oncology

## 2011-11-23 NOTE — Telephone Encounter (Signed)
Spoke with patient about status of follow-up with Dr. Marin Roberts.  Stating that she has been follow-up with Dr. Marin Roberts about the vulvar dysplasia.  Stating that she was going to follow-up with her primary or Dr. Odie Sera about having hemorrhoids.  No other concerns or questions voiced.  Instructed to call if anything needed.

## 2011-11-28 DIAGNOSIS — R109 Unspecified abdominal pain: Secondary | ICD-10-CM | POA: Diagnosis not present

## 2011-11-28 DIAGNOSIS — N10 Acute tubulo-interstitial nephritis: Secondary | ICD-10-CM | POA: Diagnosis not present

## 2011-11-28 DIAGNOSIS — R300 Dysuria: Secondary | ICD-10-CM | POA: Diagnosis not present

## 2011-11-29 DIAGNOSIS — D649 Anemia, unspecified: Secondary | ICD-10-CM | POA: Diagnosis not present

## 2011-11-29 DIAGNOSIS — N189 Chronic kidney disease, unspecified: Secondary | ICD-10-CM | POA: Diagnosis not present

## 2011-11-29 DIAGNOSIS — R300 Dysuria: Secondary | ICD-10-CM | POA: Diagnosis not present

## 2011-11-29 DIAGNOSIS — D72819 Decreased white blood cell count, unspecified: Secondary | ICD-10-CM | POA: Diagnosis not present

## 2011-11-29 DIAGNOSIS — R109 Unspecified abdominal pain: Secondary | ICD-10-CM | POA: Diagnosis not present

## 2011-11-29 DIAGNOSIS — E860 Dehydration: Secondary | ICD-10-CM | POA: Diagnosis not present

## 2011-11-29 DIAGNOSIS — N10 Acute tubulo-interstitial nephritis: Secondary | ICD-10-CM | POA: Diagnosis not present

## 2011-11-29 DIAGNOSIS — D61818 Other pancytopenia: Secondary | ICD-10-CM | POA: Diagnosis not present

## 2011-11-29 DIAGNOSIS — Z940 Kidney transplant status: Secondary | ICD-10-CM | POA: Diagnosis not present

## 2011-11-29 DIAGNOSIS — D638 Anemia in other chronic diseases classified elsewhere: Secondary | ICD-10-CM | POA: Diagnosis not present

## 2011-11-29 DIAGNOSIS — N289 Disorder of kidney and ureter, unspecified: Secondary | ICD-10-CM | POA: Diagnosis not present

## 2011-12-05 ENCOUNTER — Emergency Department (HOSPITAL_COMMUNITY)
Admission: EM | Admit: 2011-12-05 | Discharge: 2011-12-06 | Disposition: A | Discharge status: Home / Self Care | Payer: Medicare Other | Attending: Emergency Medicine | Admitting: Emergency Medicine

## 2011-12-05 ENCOUNTER — Encounter (HOSPITAL_COMMUNITY): Payer: Self-pay

## 2011-12-05 DIAGNOSIS — M549 Dorsalgia, unspecified: Secondary | ICD-10-CM | POA: Diagnosis not present

## 2011-12-05 DIAGNOSIS — Z940 Kidney transplant status: Secondary | ICD-10-CM | POA: Diagnosis not present

## 2011-12-05 DIAGNOSIS — R509 Fever, unspecified: Secondary | ICD-10-CM | POA: Diagnosis not present

## 2011-12-05 DIAGNOSIS — E7800 Pure hypercholesterolemia, unspecified: Secondary | ICD-10-CM | POA: Insufficient documentation

## 2011-12-05 DIAGNOSIS — R1031 Right lower quadrant pain: Secondary | ICD-10-CM | POA: Diagnosis not present

## 2011-12-05 DIAGNOSIS — Z79899 Other long term (current) drug therapy: Secondary | ICD-10-CM | POA: Diagnosis not present

## 2011-12-05 DIAGNOSIS — N251 Nephrogenic diabetes insipidus: Secondary | ICD-10-CM | POA: Insufficient documentation

## 2011-12-05 DIAGNOSIS — R109 Unspecified abdominal pain: Secondary | ICD-10-CM | POA: Diagnosis not present

## 2011-12-05 DIAGNOSIS — N189 Chronic kidney disease, unspecified: Secondary | ICD-10-CM | POA: Diagnosis not present

## 2011-12-05 DIAGNOSIS — R112 Nausea with vomiting, unspecified: Secondary | ICD-10-CM | POA: Insufficient documentation

## 2011-12-05 DIAGNOSIS — N289 Disorder of kidney and ureter, unspecified: Secondary | ICD-10-CM | POA: Diagnosis not present

## 2011-12-05 LAB — BASIC METABOLIC PANEL
BUN: 9 mg/dL (ref 6–23)
CO2: 22 mEq/L (ref 19–32)
Calcium: 9.5 mg/dL (ref 8.4–10.5)
Creatinine, Ser: 1.15 mg/dL — ABNORMAL HIGH (ref 0.50–1.10)
Sodium: 135 mEq/L (ref 135–145)

## 2011-12-05 LAB — URINE MICROSCOPIC-ADD ON

## 2011-12-05 LAB — DIFFERENTIAL
Basophils Absolute: 0.1 10*3/uL (ref 0.0–0.1)
Basophils Relative: 2 % — ABNORMAL HIGH (ref 0–1)
Lymphs Abs: 1.3 10*3/uL (ref 0.7–4.0)

## 2011-12-05 LAB — CBC
HCT: 33.8 % — ABNORMAL LOW (ref 36.0–46.0)
MCH: 28.8 pg (ref 26.0–34.0)
MCHC: 32.8 g/dL (ref 30.0–36.0)
MCV: 87.8 fL (ref 78.0–100.0)
Platelets: 163 10*3/uL (ref 150–400)
RBC: 3.85 MIL/uL — ABNORMAL LOW (ref 3.87–5.11)
WBC: 2.9 10*3/uL — ABNORMAL LOW (ref 4.0–10.5)

## 2011-12-05 LAB — URINALYSIS, ROUTINE W REFLEX MICROSCOPIC
Bilirubin Urine: NEGATIVE
Glucose, UA: NEGATIVE mg/dL
Ketones, ur: 15 mg/dL — AB
Leukocytes, UA: NEGATIVE
Protein, ur: 30 mg/dL — AB
Urobilinogen, UA: 0.2 mg/dL (ref 0.0–1.0)

## 2011-12-05 MED ORDER — HYDROCODONE-ACETAMINOPHEN 5-325 MG PO TABS: 1.0000 | ORAL_TABLET | ORAL | Status: AC | PRN

## 2011-12-05 MED ORDER — ONDANSETRON 8 MG PO TBDP: 8.0000 mg | ORAL_TABLET | Freq: Three times a day (TID) | ORAL | Status: AC | PRN

## 2011-12-05 MED ORDER — ACETAMINOPHEN 325 MG PO TABS
650.0000 mg | ORAL_TABLET | Freq: Once | ORAL | Status: AC
  Administered 2011-12-06: 650 mg via ORAL
  Filled 2011-12-05 (×2): qty 2

## 2011-12-05 NOTE — ED Provider Notes (Signed)
History     CSN: [id number]  Arrival date & time 12/05/11  [id number]   First MD Initiated Contact with Patient 12/05/11 2000      Chief Complaint  Patient presents with  . Chills  . Nausea    (Consider location/radiation/quality/duration/timing/severity/associated sxs/prior treatment) The history is provided by the patient.   patient's head left back/flank pain. She was seen and [facility] hospital last night and states that she had a CAT scan which did not show a cause. She had been admitted to [facility] over the weekend for pyelonephritis. She's currently on Omnicef. She states she's continued to have fevers. She states she's had nauseousness and vomiting, he states it increased after she started Percocet. She's also had some constipation. No relief with Dulcolax. She is supposed kidney transplant patient. She has right lower abdomen transplant. She states her baseline hemoglobin is 0.9. She states that it was 1.3 yesterday. She states that she was told that her urinary tract infection his been getting better.  Past Medical History  Diagnosis Date  . Hypercholesterolemia   . 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 Vulvar dystrophy   . History of renal transplant   . Nephrogenic diabetes insipidus     congenital  . Renal failure, chronic     Dialysis in the past  . 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 Kidney transplant 1995, 2008  . Tubal ligation   . Av fistula placement   . Simple vulvectomy 06/19/2011    Partial simple left  . Parathyroidectomy 2004    Portion on the parathyroid gland transplanted in R forearm    Family History  Problem Relation Age of Onset  . Hypertension Mother     History  Substance Use Topics  . Smoking status: Never Smoker   . Smokeless tobacco: Not on file  . Alcohol Use: No    OB History    Grav Para Term Preterm Abortions TAB SAB Ect Mult Living        Review of Systems  Constitutional: Positive for fever and fatigue. Negative for activity change and appetite change.  HENT: Negative for neck stiffness.   Eyes: Negative for pain.  Respiratory: Negative for chest tightness and shortness of breath.   Cardiovascular: Negative for chest pain and leg swelling.  Gastrointestinal: Positive for nausea, vomiting, abdominal pain and constipation.  Genitourinary: Positive for flank pain.  Musculoskeletal: Positive for back pain.  Skin: Negative for rash.  Neurological: Positive for headaches. Negative for weakness and numbness.  Psychiatric/Behavioral: Negative for behavioral problems.    Allergies  Oxycodone; Adhesive; Bactrim; Coumadin; and Imuran  Home Medications   Current Outpatient Rx  Name Route Sig Dispense Refill  . CEFDINIR 300 MG PO CAPS Oral Take 300 mg by mouth 2 (two) times daily. 12-02-11 for 14 days    . MAGNESIUM 200 MG PO TABS Oral Take 1 tablet by mouth daily.      [name] MYCOPHENOLATE SODIUM 360 MG PO TBEC Oral Take 360 mg by mouth 2 (two) times daily.     [name] PREDNISONE 5 MG PO TABS Oral Take 5 mg by mouth daily.      . SERTRALINE HCL 50 MG PO TABS Oral Take 50 mg by mouth daily.      [name] SIMVASTATIN 10 MG PO TABS Oral Take 10 mg by mouth daily.     [name]  TACROLIMUS 1 MG PO CAPS Oral Take 4 mg by mouth 2 (two) times daily.      [name] HYDROCODONE-ACETAMINOPHEN 5-325 MG PO TABS Oral Take 1 tablet by mouth every 4 (four) hours as needed for pain. 10 tablet 0  . ONDANSETRON 8 MG PO TBDP Oral Take 1 tablet (8 mg total) by mouth every 8 (eight) hours as needed for nausea. 20 tablet 0    BP 120/80  Pulse 80  Temp(Src) 100 F (37.8 C) (Oral)  Resp 18  SpO2 100%  LMP 11/21/2011  Physical Exam  Nursing note and vitals reviewed. Constitutional: She is oriented to person, place, and time. She appears well-developed and well-nourished.       Patient is febrile  HENT:  Head: Normocephalic and atraumatic.  Eyes: EOM are normal.  Pupils are equal, round, and reactive to light.  Neck: Normal range of motion. Neck supple.       No meningeal signs.  Cardiovascular: Regular rhythm and normal heart sounds.   No murmur heard.      Mild tachycardia  Pulmonary/Chest: Effort normal and breath sounds normal. No respiratory distress. She has no wheezes. She has no rales.  Abdominal: Soft. Bowel sounds are normal. She exhibits no distension. There is tenderness. There is no rebound and no guarding.       Tender right lower abdomen. No rebound or guarding  Musculoskeletal: Normal range of motion.  Neurological: She is alert and oriented to person, place, and time. No cranial nerve deficit.  Skin: Skin is warm and dry.  Psychiatric: She has a normal mood and affect. Her speech is normal.    ED Course  Procedures (including critical care time)  Labs Reviewed  URINALYSIS, ROUTINE W REFLEX MICROSCOPIC - Abnormal; Notable for the following:    APPearance CLOUDY (*)    Hgb urine dipstick SMALL (*)    Ketones, ur 15 (*)    Protein, ur 30 (*)    All other components within normal limits  CBC - Abnormal; Notable for the following:    WBC 2.9 (*)    RBC 3.85 (*)    Hemoglobin 11.1 (*)    HCT 33.8 (*)    All other components within normal limits  DIFFERENTIAL - Abnormal; Notable for the following:    Neutro Abs 1.3 (*)    Basophils Relative 2 (*)    All other components within normal limits  BASIC METABOLIC PANEL - Abnormal; Notable for the following:    Creatinine, Ser 1.15 (*)    GFR calc non Af Amer 61 (*)    GFR calc Af Amer 71 (*)    All other components within normal limits  URINE MICROSCOPIC-ADD ON - Abnormal; Notable for the following:    Squamous Epithelial / LPF FEW (*)    All other components within normal limits  URINE CULTURE  CULTURE, BLOOD (ROUTINE X 2)  CULTURE, BLOOD (ROUTINE X 2)   No results found.   1. Fever       MDM  Patient with fever or chills, but nauseousness. He set some left flank  pain. [facility] and diagnosed with pyelonephritis and her transplant kidney. She came back yesterday and had a repeat CT that showed a stable bilobed. Her kidney functions remain stable. She has low-grade temperature here. Her white count is just slightly low. Her urinalysis here does not show infection. After discussion with Dr. Moshe Cipro patient will be discharged home and  will followup on Friday  She will be given prescription for different nausea medicine and will be given some antiemetic       Jasper Riling. Alvino Chapel, MD 12/05/11 [phone]

## 2011-12-05 NOTE — ED Notes (Signed)
Ambulatory to bathroom for urine sample.  Attempted IV x 2 without success.  Restricted Left arm due to previous graft

## 2011-12-05 NOTE — ED Notes (Signed)
Patient presents with chills, nausea, 1 episode of vomiting today. Patient was seen at [facility] last night for lower back pain. Patient denies urinary symptoms, but reports constipation x 4 days.  Patient took Doculax last night without results.

## 2011-12-05 NOTE — Discharge Instructions (Signed)
Fever   The fever may still be related to urinary tract infection. Lab work has improved. Followup with Dr. Etheleen Nicks office on Friday. Will be consulted if the cultures return and are not covered by the antibiotic that you are on.  Fever is a higher-than-normal body temperature. A normal temperature varies with:  Age.   How it is measured (mouth, underarm, rectal, or ear).   Time of day.  In an adult, an oral temperature around 98.6 Fahrenheit (F) or 37 Celsius (C) is considered normal. A rise in temperature of about 1.8 F or 1 C is generally considered a fever (100.4 F or 38 C). In an infant age 10 days or less, a rectal temperature of 100.4 F (38 C) generally is regarded as fever. Fever is not a disease but can be a symptom of illness. CAUSES   Fever is most commonly caused by infection.   Some non-infectious problems can cause fever. For example:   Some arthritis problems.   Problems with the thyroid or adrenal glands.   Immune system problems.   Some kinds of cancer.   A reaction to certain medicines.   Occasionally, the source of a fever cannot be determined. This is sometimes called a "Fever of Unknown Origin" (FUO).   Some situations may lead to a temporary rise in body temperature that may go away on its own. Examples are:   Childbirth.   Surgery.   Some situations may cause a rise in body temperature but these are not considered "true fever". Examples are:   Intense exercise.   Dehydration.   Exposure to high outside or room temperatures.  SYMPTOMS   Feeling warm or hot.   Fatigue or feeling exhausted.   Aching all over.   Chills.   Shivering.   Sweats.  DIAGNOSIS  A fever can be suspected by your caregiver feeling that your skin is unusually warm. The fever is confirmed by taking a temperature with a thermometer. Temperatures can be taken different ways. Some methods are accurate and some are not: With adults, adolescents, and children:     An oral temperature is used most commonly.   An ear thermometer will only be accurate if it is positioned as recommended by the manufacturer.   Under the arm temperatures are not accurate and not recommended.   Most electronic thermometers are fast and accurate.  Infants and Toddlers:  Rectal temperatures are recommended and most accurate.   Ear temperatures are not accurate in this age group and are not recommended.   Skin thermometers are not accurate.  RISKS AND COMPLICATIONS   During a fever, the body uses more oxygen, so a person with a fever may develop rapid breathing or shortness of breath. This can be dangerous especially in people with heart or lung disease.   The sweats that occur following a fever can cause dehydration.   High fever can cause seizures in infants and children.   Older persons can develop confusion during a fever.  TREATMENT   Medications may be used to control temperature.   Do not give aspirin to children with fevers. There is an association with Reye's syndrome. Reye's syndrome is a rare but potentially deadly disease.   If an infection is present and medications have been prescribed, take them as directed. Finish the full course of medications until they are gone.   Sponging or bathing with room-temperature water may help reduce body temperature. Do not use ice water or alcohol sponge baths.  Do not over-bundle children in blankets or heavy clothes.   Drinking adequate fluids during an illness with fever is important to prevent dehydration.  HOME CARE INSTRUCTIONS   For adults, rest and adequate fluid intake are important. Dress according to how you feel, but do not over-bundle.   Drink enough water and/or fluids to keep your urine clear or pale yellow.   For infants over 3 months and children, giving medication as directed by your caregiver to control fever can help with comfort. The amount to be given is based on the child's weight. Do  NOT give more than is recommended.  SEEK MEDICAL CARE IF:   You or your child are unable to keep fluids down.   Vomiting or diarrhea develops.   You develop a skin rash.   An oral temperature above 102 F (38.9 C) develops, or a fever which persists for over 3 days.   You develop excessive weakness, dizziness, fainting or extreme thirst.   Fevers keep coming back after 3 days.  SEEK IMMEDIATE MEDICAL CARE IF:   Shortness of breath or trouble breathing develops   You pass out.   You feel you are making little or no urine.   New pain develops that was not there before (such as in the head, neck, chest, back, or abdomen).   You cannot hold down fluids.   Vomiting and diarrhea persist for more than a day or two.   You develop a stiff neck and/or your eyes become sensitive to light.   An unexplained temperature above 102 F (38.9 C) develops.  Document Released: 08/20/2005 Document Revised: 08/09/2011 Document Reviewed: 08/05/2008 [facility] Patient Information 2012 [place].

## 2011-12-06 LAB — URINE CULTURE
Culture  Setup Time: 201304032200
Culture: NO GROWTH

## 2011-12-10 DIAGNOSIS — Z79899 Other long term (current) drug therapy: Secondary | ICD-10-CM | POA: Diagnosis not present

## 2011-12-10 DIAGNOSIS — Z940 Kidney transplant status: Secondary | ICD-10-CM | POA: Diagnosis not present

## 2011-12-10 DIAGNOSIS — K5900 Constipation, unspecified: Secondary | ICD-10-CM | POA: Diagnosis not present

## 2011-12-12 LAB — CULTURE, BLOOD (ROUTINE X 2)
Culture  Setup Time: 201304040129
Culture: NO GROWTH

## 2011-12-14 DIAGNOSIS — Z79899 Other long term (current) drug therapy: Secondary | ICD-10-CM | POA: Diagnosis not present

## 2011-12-14 DIAGNOSIS — R109 Unspecified abdominal pain: Secondary | ICD-10-CM | POA: Diagnosis not present

## 2011-12-14 DIAGNOSIS — R1012 Left upper quadrant pain: Secondary | ICD-10-CM | POA: Diagnosis not present

## 2011-12-14 DIAGNOSIS — R110 Nausea: Secondary | ICD-10-CM | POA: Diagnosis not present

## 2011-12-14 DIAGNOSIS — IMO0002 Reserved for concepts with insufficient information to code with codable children: Secondary | ICD-10-CM | POA: Diagnosis not present

## 2011-12-14 DIAGNOSIS — R509 Fever, unspecified: Secondary | ICD-10-CM | POA: Diagnosis not present

## 2011-12-14 DIAGNOSIS — N83209 Unspecified ovarian cyst, unspecified side: Secondary | ICD-10-CM | POA: Diagnosis not present

## 2011-12-14 DIAGNOSIS — K5909 Other constipation: Secondary | ICD-10-CM | POA: Diagnosis not present

## 2011-12-15 DIAGNOSIS — IMO0002 Reserved for concepts with insufficient information to code with codable children: Secondary | ICD-10-CM | POA: Diagnosis not present

## 2011-12-15 DIAGNOSIS — R509 Fever, unspecified: Secondary | ICD-10-CM | POA: Diagnosis not present

## 2011-12-15 DIAGNOSIS — R1012 Left upper quadrant pain: Secondary | ICD-10-CM | POA: Diagnosis not present

## 2011-12-15 DIAGNOSIS — R110 Nausea: Secondary | ICD-10-CM | POA: Diagnosis not present

## 2011-12-15 DIAGNOSIS — N83209 Unspecified ovarian cyst, unspecified side: Secondary | ICD-10-CM | POA: Diagnosis not present

## 2011-12-15 DIAGNOSIS — K5909 Other constipation: Secondary | ICD-10-CM | POA: Diagnosis not present

## 2011-12-15 DIAGNOSIS — Z79899 Other long term (current) drug therapy: Secondary | ICD-10-CM | POA: Diagnosis not present

## 2011-12-17 DIAGNOSIS — Z940 Kidney transplant status: Secondary | ICD-10-CM | POA: Diagnosis not present

## 2011-12-17 DIAGNOSIS — E785 Hyperlipidemia, unspecified: Secondary | ICD-10-CM | POA: Diagnosis not present

## 2011-12-17 DIAGNOSIS — Z79899 Other long term (current) drug therapy: Secondary | ICD-10-CM | POA: Diagnosis not present

## 2011-12-17 DIAGNOSIS — N390 Urinary tract infection, site not specified: Secondary | ICD-10-CM | POA: Diagnosis not present

## 2011-12-17 DIAGNOSIS — D649 Anemia, unspecified: Secondary | ICD-10-CM | POA: Diagnosis not present

## 2011-12-18 DIAGNOSIS — M549 Dorsalgia, unspecified: Secondary | ICD-10-CM | POA: Diagnosis not present

## 2011-12-18 DIAGNOSIS — R109 Unspecified abdominal pain: Secondary | ICD-10-CM | POA: Diagnosis not present

## 2011-12-18 DIAGNOSIS — N183 Chronic kidney disease, stage 3 unspecified: Secondary | ICD-10-CM | POA: Diagnosis not present

## 2011-12-18 DIAGNOSIS — K5900 Constipation, unspecified: Secondary | ICD-10-CM | POA: Diagnosis not present

## 2011-12-18 DIAGNOSIS — D631 Anemia in chronic kidney disease: Secondary | ICD-10-CM | POA: Diagnosis not present

## 2011-12-18 DIAGNOSIS — N039 Chronic nephritic syndrome with unspecified morphologic changes: Secondary | ICD-10-CM | POA: Diagnosis not present

## 2011-12-25 DIAGNOSIS — N83209 Unspecified ovarian cyst, unspecified side: Secondary | ICD-10-CM | POA: Diagnosis not present

## 2011-12-26 DIAGNOSIS — N83209 Unspecified ovarian cyst, unspecified side: Secondary | ICD-10-CM | POA: Diagnosis not present

## 2011-12-26 DIAGNOSIS — N949 Unspecified condition associated with female genital organs and menstrual cycle: Secondary | ICD-10-CM | POA: Diagnosis not present

## 2011-12-26 DIAGNOSIS — N390 Urinary tract infection, site not specified: Secondary | ICD-10-CM | POA: Diagnosis not present

## 2012-01-01 DIAGNOSIS — H538 Other visual disturbances: Secondary | ICD-10-CM | POA: Diagnosis not present

## 2012-01-01 DIAGNOSIS — H5210 Myopia, unspecified eye: Secondary | ICD-10-CM | POA: Diagnosis not present

## 2012-01-21 DIAGNOSIS — D071 Carcinoma in situ of vulva: Secondary | ICD-10-CM | POA: Diagnosis not present

## 2012-01-21 DIAGNOSIS — N900 Mild vulvar dysplasia: Secondary | ICD-10-CM | POA: Diagnosis not present

## 2012-01-21 DIAGNOSIS — N904 Leukoplakia of vulva: Secondary | ICD-10-CM | POA: Diagnosis not present

## 2012-01-30 DIAGNOSIS — D071 Carcinoma in situ of vulva: Secondary | ICD-10-CM | POA: Diagnosis not present

## 2012-02-12 DIAGNOSIS — D071 Carcinoma in situ of vulva: Secondary | ICD-10-CM | POA: Diagnosis not present

## 2012-02-14 DIAGNOSIS — Z940 Kidney transplant status: Secondary | ICD-10-CM | POA: Diagnosis not present

## 2012-02-14 DIAGNOSIS — Z79899 Other long term (current) drug therapy: Secondary | ICD-10-CM | POA: Diagnosis not present

## 2012-02-14 DIAGNOSIS — E785 Hyperlipidemia, unspecified: Secondary | ICD-10-CM | POA: Diagnosis not present

## 2012-02-14 DIAGNOSIS — D649 Anemia, unspecified: Secondary | ICD-10-CM | POA: Diagnosis not present

## 2012-02-28 DIAGNOSIS — Z940 Kidney transplant status: Secondary | ICD-10-CM | POA: Diagnosis not present

## 2012-03-04 DIAGNOSIS — D638 Anemia in other chronic diseases classified elsewhere: Secondary | ICD-10-CM | POA: Diagnosis not present

## 2012-03-04 DIAGNOSIS — N183 Chronic kidney disease, stage 3 unspecified: Secondary | ICD-10-CM | POA: Diagnosis not present

## 2012-03-24 DIAGNOSIS — D071 Carcinoma in situ of vulva: Secondary | ICD-10-CM | POA: Diagnosis not present

## 2012-04-21 DIAGNOSIS — D638 Anemia in other chronic diseases classified elsewhere: Secondary | ICD-10-CM | POA: Diagnosis not present

## 2012-04-21 DIAGNOSIS — N183 Chronic kidney disease, stage 3 unspecified: Secondary | ICD-10-CM | POA: Diagnosis not present

## 2012-04-25 DIAGNOSIS — Z940 Kidney transplant status: Secondary | ICD-10-CM | POA: Diagnosis not present

## 2012-04-25 DIAGNOSIS — Z79899 Other long term (current) drug therapy: Secondary | ICD-10-CM | POA: Diagnosis not present

## 2012-04-25 DIAGNOSIS — D649 Anemia, unspecified: Secondary | ICD-10-CM | POA: Diagnosis not present

## 2012-04-25 DIAGNOSIS — E785 Hyperlipidemia, unspecified: Secondary | ICD-10-CM | POA: Diagnosis not present

## 2012-04-28 DIAGNOSIS — Z940 Kidney transplant status: Secondary | ICD-10-CM | POA: Diagnosis not present

## 2012-04-28 DIAGNOSIS — Z79899 Other long term (current) drug therapy: Secondary | ICD-10-CM | POA: Diagnosis not present

## 2012-06-06 DIAGNOSIS — N183 Chronic kidney disease, stage 3 unspecified: Secondary | ICD-10-CM | POA: Diagnosis not present

## 2012-06-06 DIAGNOSIS — D638 Anemia in other chronic diseases classified elsewhere: Secondary | ICD-10-CM | POA: Diagnosis not present

## 2012-07-23 DIAGNOSIS — D638 Anemia in other chronic diseases classified elsewhere: Secondary | ICD-10-CM | POA: Diagnosis not present

## 2012-07-23 DIAGNOSIS — N183 Chronic kidney disease, stage 3 unspecified: Secondary | ICD-10-CM | POA: Diagnosis not present

## 2012-07-28 DIAGNOSIS — Z940 Kidney transplant status: Secondary | ICD-10-CM | POA: Diagnosis not present

## 2012-07-28 DIAGNOSIS — D649 Anemia, unspecified: Secondary | ICD-10-CM | POA: Diagnosis not present

## 2012-07-28 DIAGNOSIS — E785 Hyperlipidemia, unspecified: Secondary | ICD-10-CM | POA: Diagnosis not present

## 2012-07-28 DIAGNOSIS — Z79899 Other long term (current) drug therapy: Secondary | ICD-10-CM | POA: Diagnosis not present

## 2012-07-30 DIAGNOSIS — Z79899 Other long term (current) drug therapy: Secondary | ICD-10-CM | POA: Diagnosis not present

## 2012-07-30 DIAGNOSIS — Z940 Kidney transplant status: Secondary | ICD-10-CM | POA: Diagnosis not present

## 2012-07-30 DIAGNOSIS — Z23 Encounter for immunization: Secondary | ICD-10-CM | POA: Diagnosis not present

## 2012-08-02 ENCOUNTER — Observation Stay (HOSPITAL_COMMUNITY)
Admission: EM | Admit: 2012-08-02 | Discharge: 2012-08-07 | Disposition: A | Discharge status: Home / Self Care | Payer: Medicare Other | Attending: Family Medicine | Admitting: Family Medicine

## 2012-08-02 ENCOUNTER — Encounter (HOSPITAL_COMMUNITY): Payer: Self-pay | Admitting: Emergency Medicine

## 2012-08-02 DIAGNOSIS — I824Z9 Acute embolism and thrombosis of unspecified deep veins of unspecified distal lower extremity: Secondary | ICD-10-CM | POA: Diagnosis not present

## 2012-08-02 DIAGNOSIS — N179 Acute kidney failure, unspecified: Secondary | ICD-10-CM | POA: Insufficient documentation

## 2012-08-02 DIAGNOSIS — E785 Hyperlipidemia, unspecified: Secondary | ICD-10-CM | POA: Insufficient documentation

## 2012-08-02 DIAGNOSIS — I82A19 Acute embolism and thrombosis of unspecified axillary vein: Secondary | ICD-10-CM | POA: Insufficient documentation

## 2012-08-02 DIAGNOSIS — N183 Chronic kidney disease, stage 3 unspecified: Secondary | ICD-10-CM | POA: Diagnosis not present

## 2012-08-02 DIAGNOSIS — I82A12 Acute embolism and thrombosis of left axillary vein: Secondary | ICD-10-CM

## 2012-08-02 DIAGNOSIS — Z940 Kidney transplant status: Secondary | ICD-10-CM

## 2012-08-02 DIAGNOSIS — I82B19 Acute embolism and thrombosis of unspecified subclavian vein: Secondary | ICD-10-CM | POA: Diagnosis not present

## 2012-08-02 DIAGNOSIS — M79609 Pain in unspecified limb: Secondary | ICD-10-CM | POA: Insufficient documentation

## 2012-08-02 DIAGNOSIS — I82622 Acute embolism and thrombosis of deep veins of left upper extremity: Secondary | ICD-10-CM

## 2012-08-02 DIAGNOSIS — D649 Anemia, unspecified: Secondary | ICD-10-CM | POA: Insufficient documentation

## 2012-08-02 DIAGNOSIS — I808 Phlebitis and thrombophlebitis of other sites: Secondary | ICD-10-CM

## 2012-08-02 DIAGNOSIS — E782 Mixed hyperlipidemia: Secondary | ICD-10-CM

## 2012-08-02 DIAGNOSIS — N63 Unspecified lump in unspecified breast: Secondary | ICD-10-CM | POA: Insufficient documentation

## 2012-08-02 HISTORY — DX: Phlebitis and thrombophlebitis of other sites: I80.8

## 2012-08-02 HISTORY — DX: Acute embolism and thrombosis of left axillary vein: I82.A12

## 2012-08-02 HISTORY — DX: Kidney transplant status: Z94.0

## 2012-08-02 HISTORY — DX: Mixed hyperlipidemia: E78.2

## 2012-08-02 LAB — BASIC METABOLIC PANEL
CO2: 18 mEq/L — ABNORMAL LOW (ref 19–32)
Chloride: 105 mEq/L (ref 96–112)
Creatinine, Ser: 1.4 mg/dL — ABNORMAL HIGH (ref 0.50–1.10)
Glucose, Bld: 89 mg/dL (ref 70–99)
Sodium: 134 mEq/L — ABNORMAL LOW (ref 135–145)

## 2012-08-02 LAB — CBC WITH DIFFERENTIAL/PLATELET
Basophils Absolute: 0 10*3/uL (ref 0.0–0.1)
HCT: 33.2 % — ABNORMAL LOW (ref 36.0–46.0)
Lymphocytes Relative: 19 % (ref 12–46)
Lymphs Abs: 1.1 10*3/uL (ref 0.7–4.0)
MCV: 86.5 fL (ref 78.0–100.0)
Monocytes Absolute: 0.4 10*3/uL (ref 0.1–1.0)
Neutro Abs: 4 10*3/uL (ref 1.7–7.7)
RBC: 3.84 MIL/uL — ABNORMAL LOW (ref 3.87–5.11)
RDW: 13.8 % (ref 11.5–15.5)
WBC: 5.5 10*3/uL (ref 4.0–10.5)

## 2012-08-02 LAB — APTT: aPTT: 40 seconds — ABNORMAL HIGH (ref 24–37)

## 2012-08-02 LAB — PROTIME-INR: INR: 0.99 (ref 0.00–1.49)

## 2012-08-02 LAB — PREGNANCY, URINE: Preg Test, Ur: NEGATIVE

## 2012-08-02 MED ORDER — SIMVASTATIN 10 MG PO TABS
10.0000 mg | ORAL_TABLET | Freq: Every day | ORAL | Status: DC
  Filled 2012-08-02: qty 1

## 2012-08-02 MED ORDER — TACROLIMUS 1 MG PO CAPS
4.0000 mg | ORAL_CAPSULE | Freq: Two times a day (BID) | ORAL | Status: DC
  Administered 2012-08-03 – 2012-08-07 (×10): 4 mg via ORAL
  Filled 2012-08-02 (×11): qty 4

## 2012-08-02 MED ORDER — SODIUM CHLORIDE 0.9 % IV SOLN: 250.0000 mL | INTRAVENOUS | Status: DC | PRN

## 2012-08-02 MED ORDER — HYDROCODONE-ACETAMINOPHEN 5-325 MG PO TABS
2.0000 | ORAL_TABLET | Freq: Once | ORAL | Status: AC
  Administered 2012-08-02: 2 via ORAL
  Filled 2012-08-02: qty 2

## 2012-08-02 MED ORDER — MYCOPHENOLATE SODIUM 180 MG PO TBEC: 360.0000 mg | DELAYED_RELEASE_TABLET | Freq: Two times a day (BID) | ORAL | Status: DC

## 2012-08-02 MED ORDER — ENOXAPARIN SODIUM 80 MG/0.8ML ~~LOC~~ SOLN
1.0000 mg/kg | Freq: Two times a day (BID) | SUBCUTANEOUS | Status: DC
  Administered 2012-08-03 – 2012-08-07 (×9): 65 mg via SUBCUTANEOUS
  Filled 2012-08-02 (×11): qty 0.8

## 2012-08-02 MED ORDER — ACETAMINOPHEN 325 MG PO TABS
650.0000 mg | ORAL_TABLET | Freq: Four times a day (QID) | ORAL | Status: DC | PRN
  Administered 2012-08-07: 650 mg via ORAL
  Filled 2012-08-02: qty 2

## 2012-08-02 MED ORDER — ACETAMINOPHEN 650 MG RE SUPP: 650.0000 mg | Freq: Four times a day (QID) | RECTAL | Status: DC | PRN

## 2012-08-02 MED ORDER — SODIUM CHLORIDE 0.9 % IJ SOLN: 3.0000 mL | INTRAMUSCULAR | Status: DC | PRN

## 2012-08-02 MED ORDER — COUMADIN BOOK
1.0000 | Freq: Once | Status: AC
  Administered 2012-08-03: 1
  Filled 2012-08-02: qty 1

## 2012-08-02 MED ORDER — SODIUM CHLORIDE 0.9 % IJ SOLN
3.0000 mL | Freq: Two times a day (BID) | INTRAMUSCULAR | Status: DC
  Administered 2012-08-03 – 2012-08-04 (×3): 3 mL via INTRAVENOUS

## 2012-08-02 MED ORDER — MAGNESIUM OXIDE 400 (241.3 MG) MG PO TABS
200.0000 mg | ORAL_TABLET | Freq: Every day | ORAL | Status: DC
  Administered 2012-08-03 – 2012-08-07 (×5): 200 mg via ORAL
  Filled 2012-08-02 (×6): qty 0.5

## 2012-08-02 MED ORDER — WARFARIN SODIUM 7.5 MG PO TABS
7.5000 mg | ORAL_TABLET | Freq: Once | ORAL | Status: AC
  Administered 2012-08-03: 7.5 mg via ORAL
  Filled 2012-08-02: qty 1

## 2012-08-02 MED ORDER — ONDANSETRON HCL 4 MG PO TABS: 4.0000 mg | ORAL_TABLET | Freq: Four times a day (QID) | ORAL | Status: DC | PRN

## 2012-08-02 MED ORDER — HEPARIN BOLUS VIA INFUSION
4000.0000 [IU] | Freq: Once | INTRAVENOUS | Status: AC
  Administered 2012-08-02: 4000 [IU] via INTRAVENOUS

## 2012-08-02 MED ORDER — PREDNISONE 5 MG PO TABS
5.0000 mg | ORAL_TABLET | Freq: Every day | ORAL | Status: DC
  Administered 2012-08-03 – 2012-08-07 (×5): 5 mg via ORAL
  Filled 2012-08-02 (×6): qty 1

## 2012-08-02 MED ORDER — MAGNESIUM 200 MG PO TABS: 1.0000 | ORAL_TABLET | Freq: Every day | ORAL | Status: DC

## 2012-08-02 MED ORDER — HEPARIN (PORCINE) IN NACL 100-0.45 UNIT/ML-% IJ SOLN
1000.0000 [IU]/h | INTRAMUSCULAR | Status: DC
  Administered 2012-08-02: 1000 [IU]/h via INTRAVENOUS
  Filled 2012-08-02: qty 250

## 2012-08-02 MED ORDER — HYDROCODONE-ACETAMINOPHEN 5-325 MG PO TABS
1.0000 | ORAL_TABLET | ORAL | Status: DC | PRN
  Administered 2012-08-03 – 2012-08-06 (×6): 2 via ORAL
  Filled 2012-08-02 (×7): qty 2

## 2012-08-02 MED ORDER — WARFARIN VIDEO
1.0000 | Freq: Once | Status: AC
  Administered 2012-08-03: 1

## 2012-08-02 MED ORDER — MYCOPHENOLATE SODIUM 180 MG PO TBEC
360.0000 mg | DELAYED_RELEASE_TABLET | Freq: Two times a day (BID) | ORAL | Status: DC
  Administered 2012-08-03 – 2012-08-05 (×6): 360 mg via ORAL
  Filled 2012-08-02 (×7): qty 2

## 2012-08-02 MED ORDER — WARFARIN - PHARMACIST DOSING INPATIENT
Freq: Every day | Status: DC
  Administered 2012-08-03: 17:00:00

## 2012-08-02 MED ORDER — ONDANSETRON HCL 4 MG/2ML IJ SOLN: 4.0000 mg | Freq: Four times a day (QID) | INTRAMUSCULAR | Status: DC | PRN

## 2012-08-02 NOTE — ED Notes (Signed)
Pt c/o left arm pain where old dialysis graft is; pt with no dialysis x 5 years; pt denies injury; mild redness and swelling noted; CMS intact

## 2012-08-02 NOTE — H&P (Signed)
[facility] Admission History and Physical  Patient name: Ariel [name] Medical record number: [id number] Date of birth: 03/15/1976 Age: 36 y.o. Gender: female  Primary Care Provider: Dr. Megan Salon at [facility] Nephrologist: Dr. Mercy Moore  Chief Complaint: left arm pain History of Present Illness: Ariel [name] is a 36 y.o. year old female s/p renal transplant presenting with left arm pain.  She reports seeing Dr. Mercy Moore on Wednesday and getting a good report on her left upper extremity graft and transplant function.  On Thursday, she developed an achy pain in her left armpit.  This continued on Friday and spread to include her left forearm.  She contacted the renal office and was told to treat it as a pulled muscled with heat and tylenol.  She slept with arm propped up Friday night, but had continued pain this morning and noticed swelling in her upper arm and forearm.  Also reports feeling warm the day of admission.  She denies pregnancy (had Essure), no hormone therapy, no family history of blood clots.  She does report being on Coumadin following her first kidney transplant in the 1990s for possible decreased blood flow, but no documented clot that she is aware off.  Also reports feeling a non-tender, mobile lump in the axilla for the last month; no suspicious masses on self breast exam (does have family history of breast cancer).  No fevers, weight loss, night sweats.  Has had nausea for the last 3 months without vomiting.   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  This transplant failed in 2001 and she started peritoneal dialysis.  Her current graft with the clot was placed around 2005 after a fistula failed.  Current transplant occurred in 2007.  Compliant with anti-rejection medications, but did miss this morning's dose.    Past Medical History: Past Medical History  Diagnosis Date  .  Hypercholesterolemia   . 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 Vulvar dystrophy   . History of renal transplant   . Nephrogenic diabetes insipidus     congenital  . Renal failure, chronic     Dialysis in the past  . 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 Kidney transplant 1995, 2008  . Tubal ligation   . Av fistula placement   . Simple vulvectomy 06/19/2011    Partial simple left  . 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 Marital Status: Divorced    Spouse Name: N/A    Number of Children: N/A  . Years of Education: N/A   Social History Main Topics  . Smoking status: Never Smoker   . Smokeless tobacco: None  . Alcohol Use: No  . Drug Use: No  . Sexually Active:    Other Topics Concern  . None   Social History Narrative  . None    Family History: Family History  Problem Relation Age of Onset  . Hypertension Mother   Colon cancer in [place] Breast cancer in [place] and paternal aunt  Allergies: Allergies  Allergen Reactions  . Oxycodone Nausea And Vomiting  . Adhesive (Tape) Rash  . Bactrim Swelling and Rash  . Imuran (Azathioprine Sodium) Rash  . Warfarin Sodium Rash    Current  Facility-Administered Medications  Medication Dose Route Frequency Provider Last Rate Last Dose  . heparin ADULT infusion 100 units/mL (25000 units/250 mL)  1,000 Units/hr Intravenous Continuous Mylinda Latina III, MD 10 mL/hr at 08/02/12 2007 1,000 Units/hr at 08/02/12 2007  . [COMPLETED] heparin bolus via infusion 4,000 Units  4,000 Units Intravenous Once Mylinda Latina III, MD   4,000 Units at 08/02/12 2005  . [COMPLETED] HYDROcodone-acetaminophen (NORCO/VICODIN) 5-325 MG per tablet 2 tablet  2 tablet Oral Once Abigail Butts, PA-C   2 tablet at 08/02/12 1940   Current Outpatient Prescriptions   Medication Sig Dispense Refill  . Magnesium 200 MG TABS Take 1 tablet by mouth daily.        . mycophenolate (MYFORTIC) 360 MG TBEC Take 360 mg by mouth 2 (two) times daily.       . predniSONE (DELTASONE) 5 MG tablet Take 5 mg by mouth daily.        . simvastatin (ZOCOR) 10 MG tablet Take 10 mg by mouth daily.       . tacrolimus (PROGRAF) 1 MG capsule Take 4 mg by mouth 2 (two) times daily.         Review Of Systems: Per HPI with the following additions: none Otherwise 12 point review of systems was performed and was unremarkable.  Physical Exam: Pulse: 89  Blood Pressure: 126/83 RR: 16   O2: 100 on RA Temp: 98  General: alert, cooperative, appears stated age and no distress HEENT: extra ocular movement intact, sclera clear, anicteric, oropharynx clear, no lesions and neck supple with midline trachea Heart: 2/6 SEM is heard at 2nd left intercostal space, S1 and S2 normal, regular rate and rhythm Lungs: clear to auscultation, no wheezes or rales and unlabored breathing Abdomen: abdomen is soft without significant tenderness, masses, organomegaly or guarding Extremities: extremities normal, atraumatic, no cyanosis or edema and Left arm with swelling in upper and lower arm, mild erythema in upper arm Skin:no rashes Neurology: normal without focal findings, mental status, speech normal, alert and oriented x3, muscle tone and strength normal and symmetric and sensation grossly normal Lymph exam: no LAD appreciated in cervical, supraclavicular or axilla Breast exam: 1cm non-tender, smooth, mobile nodule at 9 o'clock  Labs and Imaging: Lab Results  Component Value Date/Time   NA 134* 08/02/2012  3:58 PM   K 3.9 08/02/2012  3:58 PM   CL 105 08/02/2012  3:58 PM   CO2 18* 08/02/2012  3:58 PM   BUN 20 08/02/2012  3:58 PM   CREATININE 1.40* 08/02/2012  3:58 PM   GLUCOSE 89 08/02/2012  3:58 PM   Lab Results  Component Value Date   WBC 5.5 08/02/2012   HGB 10.3* 08/02/2012   HCT 33.2*  08/02/2012   MCV 86.5 08/02/2012   PLT 196 08/02/2012    Lab 08/02/12 1928  INR 0.99   APTT: 40 BCx: pending   Assessment and Plan: Ariel [name] is a 36 y.o. year old female with renal transplant and graft in left upper extremity presenting with left arm pain and swelling found to have an acute subclavian and axillary veins.  # Acute DVT: Unclear etiology.  Not on OCPs or hormone therapy, no family history of DVT, not likely to be pregnant with Essure.  Some concern for occult malignancy given report of what sounds like an axillary lymph node and possible mass on breast exam.  Started on heparin drip in the ED. - will switch to lovenox 1mg /kg q12hr - pharmacy to  start coumadin tomorrow - will monitor for allergy reaction - will check breast ultrasound for possible malignancy - will also hypercoagulability panel - if no evidence for malignancy will likely switch to Xarelto   # Acute kidney injury: Creatinine elevated to 1.40 on admission (baseline appears to be closer to 1.1).  No clear etiology as she does not report decreased PO intake. - will consult renal in the morning given transplant status - will check a prograf level - monitor with daily BMP  # S/p renal transplant: Seen by Dr. Mercy Moore on Wednesday and told everything going well.  Compliant with anti-rejection meds, although did miss this mornings medications. - consult renal - continue prograf, myfortic, and prednisone  # Hyperlipidemia - continue Zocor 10mg   # Anemia: Likely from renal transplant.  Receiving Procrit injections every 2 weeks.  Reported having a low iron level and that Dr. Mercy Moore was going to given IV iron. - follow up with renal  FEN/GI: renal diet, SLIV Prophylaxis: on therapeutic anticoagulation Disposition: observation; d/c pending final anticoagulation decision Code status: Full code  BOOTH, [place] 08/02/2012, 9:39 PM

## 2012-08-02 NOTE — ED Provider Notes (Signed)
History     CSN: [id number]  Arrival date & time 08/02/12  1302   First MD Initiated Contact with Patient 08/02/12 1513      Chief Complaint  Patient presents with  . Arm Pain    (Consider location/radiation/quality/duration/timing/severity/associated sxs/prior treatment) The history is provided by the patient and medical records.    Ariel [name] is a 36 y.o. female presents to the emergency department complaining of left arm pain. Patient has a history of renal failure with dialysis and subsequent kidney transplant.  Her fistula and then dialysis graft are both in the left arm. She states that it has not been used in more than 5 years. She states she saw her nephrologist at [facility] on Wednesday who checked the graft and stated that it was in good shape. There is a morning she awoke with left arm pain in the left upper arm.  It persisted through Friday, she spoke with [facility] on Friday who recommended Tylenol and heat.  The pain persisted and she awoke this morning with redness and streaking in her arm.  She also had associated swelling in the L arm.  Nothing makes the pain better and palpation, movement and use make the the pain worse.  Pt denies fever, chills, headache, neck pain, chest pain, shortness of breath, abdominal pain, nausea, vomiting, diarrhea, weakness, dizziness, numbness, tingling, syncope.  Past Medical History  Diagnosis Date  . Hypercholesterolemia   . 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 Vulvar dystrophy   . History of renal transplant   . Nephrogenic diabetes insipidus     congenital  . Renal failure, chronic     Dialysis in the past  . 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 Kidney transplant 1995, 2008  . Tubal ligation   . Av fistula placement   . Simple vulvectomy 06/19/2011    Partial simple left  . Parathyroidectomy 2004    Portion on the  parathyroid gland transplanted in R forearm    Family History  Problem Relation Age of Onset  . Hypertension Mother     History  Substance Use Topics  . Smoking status: Never Smoker   . Smokeless tobacco: Not on file  . Alcohol Use: No    OB History    Grav Para Term Preterm Abortions TAB SAB Ect Mult Living                  Review of Systems  Constitutional: Negative for fever, diaphoresis, appetite change, fatigue and unexpected weight change.  HENT: Negative for mouth sores and neck stiffness.   Eyes: Negative for visual disturbance.  Respiratory: Negative for cough, chest tightness, shortness of breath and wheezing.   Cardiovascular: Negative for chest pain.  Gastrointestinal: Negative for nausea, vomiting, abdominal pain, diarrhea and constipation.  Genitourinary: Negative for dysuria, urgency, frequency and hematuria.  Musculoskeletal: Positive for arthralgias (left shoulder and arm).  Skin: Positive for color change. Negative for rash.  Neurological: Negative for syncope, light-headedness and headaches.  Psychiatric/Behavioral: Negative for sleep disturbance. The patient is not nervous/anxious.   All other systems reviewed and are negative.    Allergies  Oxycodone; Adhesive; Bactrim; Imuran; and Warfarin sodium  Home Medications   No current outpatient prescriptions on file.  BP 127/65  Pulse 83  Temp 97.8 F (36.6 C) (Oral)  Resp 18  Ht 4'  11" (1.499 m)  Wt 144 lb 13.5 oz (65.7 kg)  BMI 29.25 kg/m2  SpO2 100%  Physical Exam  Nursing note and vitals reviewed. Constitutional: She appears well-developed and well-nourished. No distress.  HENT:  Head: Normocephalic and atraumatic.  Mouth/Throat: Oropharynx is clear and moist. No oropharyngeal exudate.  Eyes: Conjunctivae normal are normal. No scleral icterus.  Neck: Normal range of motion. Neck supple.  Cardiovascular: Normal rate, regular rhythm, normal heart sounds and intact distal pulses.  Exam  reveals no gallop and no friction rub.   No murmur heard. Pulmonary/Chest: Effort normal and breath sounds normal. No respiratory distress. She has no wheezes. She has no rales. She exhibits no tenderness.  Abdominal: Soft. Bowel sounds are normal. She exhibits no mass. There is no tenderness. There is no rebound and no guarding.  Musculoskeletal: Normal range of motion. She exhibits tenderness (L arm around the shunt and up into the axilla). She exhibits no edema.  Lymphadenopathy:       Head (right side): No submental, no submandibular, no tonsillar, no preauricular, no posterior auricular and no occipital adenopathy present.       Head (left side): No submental, no submandibular, no tonsillar, no preauricular, no posterior auricular and no occipital adenopathy present.    She has no cervical adenopathy.       Right cervical: No superficial cervical, no deep cervical and no posterior cervical adenopathy present.      Left cervical: No superficial cervical, no deep cervical and no posterior cervical adenopathy present.       Right axillary: No pectoral and no lateral adenopathy present.       Left axillary: No pectoral and no lateral adenopathy present. Neurological: She is alert. She exhibits normal muscle tone. Coordination normal.       Speech is clear and goal oriented Moves extremities without ataxia  Skin: Skin is warm and dry. She is not diaphoretic. There is erythema.     Psychiatric: She has a normal mood and affect.    ED Course  Procedures (including critical care time)  Labs Reviewed  CBC WITH DIFFERENTIAL - Abnormal; Notable for the following:    RBC 3.84 (*)     Hemoglobin 10.3 (*)     HCT 33.2 (*)     All other components within normal limits  BASIC METABOLIC PANEL - Abnormal; Notable for the following:    Sodium 134 (*)     CO2 18 (*)     Creatinine, Ser 1.40 (*)     GFR calc non Af Amer 48 (*)     GFR calc Af Amer 56 (*)     All other components within normal  limits  APTT - Abnormal; Notable for the following:    aPTT 40 (*)     All other components within normal limits  CBC - Abnormal; Notable for the following:    RBC 3.31 (*)     Hemoglobin 9.0 (*)     HCT 28.7 (*)     All other components within normal limits  BASIC METABOLIC PANEL - Abnormal; Notable for the following:    CO2 17 (*)     Glucose, Bld 120 (*)     Creatinine, Ser 1.33 (*)     GFR calc non Af Amer 51 (*)     GFR calc Af Amer 59 (*)     All other components within normal limits  CULTURE, BLOOD (ROUTINE X 2)  CULTURE, BLOOD (ROUTINE X 2)  PROTIME-INR  PREGNANCY, URINE  PROTIME-INR  HOMOCYSTEINE  ANTITHROMBIN III  HCG, SERUM, QUALITATIVE  PROTEIN C ACTIVITY  PROTEIN C, TOTAL  PROTEIN S ACTIVITY  PROTEIN S, TOTAL  LUPUS ANTICOAGULANT PANEL  BETA-2-GLYCOPROTEIN I ABS, IGG/M/A  FACTOR 5 LEIDEN  CARDIOLIPIN ANTIBODIES, IGG, IGM, IGA  TACROLIMUS LEVEL  PROTIME-INR  CBC  RENAL FUNCTION PANEL   No results found.  Results for orders placed during the hospital encounter of 08/02/12  CBC WITH DIFFERENTIAL      Component Value Range   WBC 5.5  4.0 - 10.5 K/uL   RBC 3.84 (*) 3.87 - 5.11 MIL/uL   Hemoglobin 10.3 (*) 12.0 - 15.0 g/dL   HCT 33.2 (*) 36.0 - 46.0 %   MCV 86.5  78.0 - 100.0 fL   MCH 26.8  26.0 - 34.0 pg   MCHC 31.0  30.0 - 36.0 g/dL   RDW 13.8  11.5 - 15.5 %   Platelets 196  150 - 400 K/uL   Neutrophils Relative 73  43 - 77 %   Neutro Abs 4.0  1.7 - 7.7 K/uL   Lymphocytes Relative 19  12 - 46 %   Lymphs Abs 1.1  0.7 - 4.0 K/uL   Monocytes Relative 7  3 - 12 %   Monocytes Absolute 0.4  0.1 - 1.0 K/uL   Eosinophils Relative 1  0 - 5 %   Eosinophils Absolute 0.0  0.0 - 0.7 K/uL   Basophils Relative 0  0 - 1 %   Basophils Absolute 0.0  0.0 - 0.1 K/uL  BASIC METABOLIC PANEL      Component Value Range   Sodium 134 (*) 135 - 145 mEq/L   Potassium 3.9  3.5 - 5.1 mEq/L   Chloride 105  96 - 112 mEq/L   CO2 18 (*) 19 - 32 mEq/L   Glucose, Bld 89  70 -  99 mg/dL   BUN 20  6 - 23 mg/dL   Creatinine, Ser 1.40 (*) 0.50 - 1.10 mg/dL   Calcium 9.9  8.4 - 10.5 mg/dL   GFR calc non Af Amer 48 (*) >90 mL/min   GFR calc Af Amer 56 (*) >90 mL/min  CULTURE, BLOOD (ROUTINE X 2)      Component Value Range   Specimen Description BLOOD RIGHT ARM     Special Requests BOTTLES DRAWN AEROBIC ONLY 5CC     Culture  Setup Time 08/02/2012 21:47     Culture       Value:        BLOOD CULTURE RECEIVED NO GROWTH TO DATE CULTURE WILL BE HELD FOR 5 DAYS BEFORE ISSUING A FINAL NEGATIVE REPORT   Report Status PENDING    CULTURE, BLOOD (ROUTINE X 2)      Component Value Range   Specimen Description BLOOD RIGHT HAND     Special Requests BOTTLES DRAWN AEROBIC AND ANAEROBIC 10CC     Culture  Setup Time 08/02/2012 21:47     Culture       Value:        BLOOD CULTURE RECEIVED NO GROWTH TO DATE CULTURE WILL BE HELD FOR 5 DAYS BEFORE ISSUING A FINAL NEGATIVE REPORT   Report Status PENDING    PROTIME-INR      Component Value Range   Prothrombin Time 13.0  11.6 - 15.2 seconds   INR 0.99  0.00 - 1.49  APTT      Component Value Range   aPTT 40 (*) 24 -  37 seconds  PREGNANCY, URINE      Component Value Range   Preg Test, Ur NEGATIVE  NEGATIVE  CBC      Component Value Range   WBC 6.1  4.0 - 10.5 K/uL   RBC 3.31 (*) 3.87 - 5.11 MIL/uL   Hemoglobin 9.0 (*) 12.0 - 15.0 g/dL   HCT 28.7 (*) 36.0 - 46.0 %   MCV 86.7  78.0 - 100.0 fL   MCH 27.2  26.0 - 34.0 pg   MCHC 31.4  30.0 - 36.0 g/dL   RDW 13.8  11.5 - 15.5 %   Platelets 202  150 - 400 K/uL  PROTIME-INR      Component Value Range   Prothrombin Time 14.2  11.6 - 15.2 seconds   INR 1.11  0.00 - 99991111  BASIC METABOLIC PANEL      Component Value Range   Sodium 135  135 - 145 mEq/L   Potassium 3.7  3.5 - 5.1 mEq/L   Chloride 107  96 - 112 mEq/L   CO2 17 (*) 19 - 32 mEq/L   Glucose, Bld 120 (*) 70 - 99 mg/dL   BUN 18  6 - 23 mg/dL   Creatinine, Ser 1.33 (*) 0.50 - 1.10 mg/dL   Calcium 9.3  8.4 - 10.5 mg/dL    GFR calc non Af Amer 51 (*) >90 mL/min   GFR calc Af Amer 59 (*) >90 mL/min  HOMOCYSTEINE      Component Value Range   Homocysteine 11.8  4.0 - 15.4 umol/L  ANTITHROMBIN III      Component Value Range   AntiThromb III Func 89  75 - 120 %  HCG, SERUM, QUALITATIVE      Component Value Range   Preg, Serum NEGATIVE  NEGATIVE   No results found.    1. Deep venous thrombosis of left upper limb       MDM  Clydene Pugh Leverette this emergency department with left arm pain. On exam mild erythema tracking along for dialysis graft. Patient status post kidney transplant which makes her immunocompromised and is concern for complication/cause of the infection. She is without history of diabetes and is not at risk for HIV.  CBC without leukocytosis, BMP was mildly elevated kidney function, APTT high at 40, PT/INR within normal limits.    Left upper extremity venous Doppler with acute, occlusive DVT noted in the subclavian and axillary veins of the left upper extremity. We'll begin anticoagulation with heparin per pharmacy and pursue admission.  Dr. Mylinda Latina was consulted, evaluated this patient with me and agrees with the plan.        Jarrett Soho Rhemi Balbach, PA-C 08/04/12 [phone]

## 2012-08-02 NOTE — Progress Notes (Signed)
VASCULAR LAB PRELIMINARY  PRELIMINARY  PRELIMINARY  PRELIMINARY  Left upper extremity venous Doppler completed.    Preliminary report:  There is acute, occlusive DVT noted in the subclavian and axillary veins of the left upper extremity.  Ariel [name], 08/02/2012, 7:00 PM

## 2012-08-02 NOTE — Progress Notes (Addendum)
ANTICOAGULATION CONSULT NOTE - Initial Consult  Pharmacy Consult for heparin Indication: DVT of subclavian and axillary veins of left upper extremitiy  Allergies  Allergen Reactions  . Oxycodone Nausea And Vomiting  . Adhesive (Tape) Rash  . Bactrim Swelling and Rash  . Imuran (Azathioprine Sodium) Rash  . Warfarin Sodium Rash    Patient Measurements:   Heparin Dosing Weight: 65 kg  Vital Signs: Temp: 98 F (36.7 C) (11/30 1316) Temp src: Oral (11/30 1316) BP: 126/83 mmHg (11/30 1800) Pulse Rate: 89  (11/30 1800)  Labs:  Basename 08/02/12 1558  HGB 10.3*  HCT 33.2*  PLT 196  APTT --  LABPROT --  INR --  HEPARINUNFRC --  CREATININE 1.40*  CKTOTAL --  CKMB --  TROPONINI --    The CrCl is unknown because both a height and weight (above a minimum accepted value) are required for this calculation.   Medical History: Past Medical History  Diagnosis Date  . Hypercholesterolemia   . 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 Vulvar dystrophy   . History of renal transplant   . Nephrogenic diabetes insipidus     congenital  . Renal failure, chronic     Dialysis in the past  . 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 HYDROcodone-acetaminophen  2 tablet Oral Once   Infusions:    Assessment: 36 yo female with DVT of subclavian and axillary veins of left upper extremity will be started on heparin therapy.  H/H 10.3/33.2; Plt 196.  Patient was not on anticoagulant prior to admission Goal of Therapy:  Heparin level 0.3-0.7 units/ml Monitor platelets by anticoagulation protocol: Yes   Plan:  1) Heparin 4000 units iv bolus x1, then start heparin drip at 1000 units/hr 2) Check an 8hr heparin level after drip is started 3) Daily heparin level and CBC  So, Tsz-Yin 08/02/2012,7:32 PM  Addendum: Heparin changed to Lovenox per physician dosing. Pharmacy consulted to manage Coumadin. Baseline INR 0.99. Per  H&P, patient on Coumadin in 1990's and with documented allergy of rash. Pharmacy to start Coumadin on 12/1 and monitor for allergic reaction per H&P. Coumadin 7.5mg  po at 18:00 on 12/1. Daily PT / INR starting 12/2.   Confirmed with RN that heparin gtt to be d/c at 23:59 and Lovenox to be started at 01:00 AM.  Raye Sorrow, PharmD 08/02/12, 23:45 PM

## 2012-08-03 ENCOUNTER — Encounter (HOSPITAL_COMMUNITY): Payer: Self-pay | Admitting: *Deleted

## 2012-08-03 DIAGNOSIS — I824Z9 Acute embolism and thrombosis of unspecified deep veins of unspecified distal lower extremity: Secondary | ICD-10-CM

## 2012-08-03 DIAGNOSIS — D649 Anemia, unspecified: Secondary | ICD-10-CM

## 2012-08-03 DIAGNOSIS — N63 Unspecified lump in unspecified breast: Secondary | ICD-10-CM | POA: Diagnosis not present

## 2012-08-03 DIAGNOSIS — N179 Acute kidney failure, unspecified: Secondary | ICD-10-CM

## 2012-08-03 LAB — CBC
MCHC: 31.4 g/dL (ref 30.0–36.0)
Platelets: 202 10*3/uL (ref 150–400)
RDW: 13.8 % (ref 11.5–15.5)
WBC: 6.1 10*3/uL (ref 4.0–10.5)

## 2012-08-03 LAB — PROTIME-INR: INR: 1.11 (ref 0.00–1.49)

## 2012-08-03 LAB — BASIC METABOLIC PANEL
Calcium: 9.3 mg/dL (ref 8.4–10.5)
Creatinine, Ser: 1.33 mg/dL — ABNORMAL HIGH (ref 0.50–1.10)
GFR calc non Af Amer: 51 mL/min — ABNORMAL LOW (ref >90)
Sodium: 135 mEq/L (ref 135–145)

## 2012-08-03 LAB — HOMOCYSTEINE: Homocysteine: 11.8 umol/L (ref 4.0–15.4)

## 2012-08-03 LAB — HCG, SERUM, QUALITATIVE: Preg, Serum: NEGATIVE

## 2012-08-03 MED ORDER — MORPHINE SULFATE 2 MG/ML IJ SOLN: 1.0000 mg | INTRAMUSCULAR | Status: DC | PRN

## 2012-08-03 MED ORDER — SIMVASTATIN 10 MG PO TABS
10.0000 mg | ORAL_TABLET | Freq: Every day | ORAL | Status: DC
  Administered 2012-08-04 – 2012-08-05 (×2): 10 mg via ORAL
  Filled 2012-08-03 (×2): qty 1

## 2012-08-03 NOTE — Progress Notes (Signed)
Seen and examined today, see H&P note.  Dalbert Mayotte, MD

## 2012-08-03 NOTE — Progress Notes (Signed)
FMTS Daily Progress Note  Subjective: Doing okay this morning.  Did not sleep well.  Had several episodes of flushing with sweats overnight.  Arm pain is okay as long she does not move it too much.  Reports baseline Cr around 1.1-1.5; Recent Hgb of 10.2.  I have reviewed the patient's medications.  Objective Temp:  [98 F (36.7 C)-99.2 F (37.3 C)] 99.2 F (37.3 C) (12/01 0558) Pulse Rate:  [78-111] 99  (12/01 0558) Resp:  [16-20] 18  (12/01 0558) BP: (113-140)/(63-88) 116/63 mmHg (12/01 0558) SpO2:  [99 %-100 %] 99 % (12/01 0558) Weight:  [144 lb 13.5 oz (65.7 kg)] 144 lb 13.5 oz (65.7 kg) (12/01 0558)   Intake/Output Summary (Last 24 hours) at 08/03/12 0656 Last data filed at 08/03/12 0500  Gross per 24 hour  Intake    120 ml  Output      0 ml  Net    120 ml    CBG (last 3)  No results found for this basename: GLUCAP:3 in the last 72 hours  General: alert, cooperative, NAD HEENT: AT/[place] CV: RRR, 2/6 systolic murmur in left upper sternal border Pulm: CTAB, no wheezes or rales Ext: no LE edema; left arm with mild swelling, stable to slightly reduced erythema and warmth Neuro: alert, oriented, no focal deficits  Labs and Imaging  Lab 08/03/12 0340 08/02/12 1558  WBC 6.1 5.5  HGB 9.0* 10.3*  HCT 28.7* 33.2*  PLT 202 196     Lab 08/03/12 0340 08/02/12 1558  NA 135 134*  K 3.7 3.9  CL 107 105  CO2 17* 18*  BUN 18 20  CREATININE 1.33* 1.40*  LABGLOM -- --  GLUCOSE 120* --  CALCIUM 9.3 9.9   Hypercoagulable work up: pending -Anti-thrombin III: nml  Tacrolimus level: pending  Assessment and Plan Ariel [name] is a 36 y.o. year old female with renal transplant and graft in left upper extremity presenting with left arm pain and swelling found to have an acute subclavian and axillary veins.   # Acute DVT: Unclear etiology. Not on OCPs or hormone therapy, no family history of DVT, not likely to be pregnant with Essure. Some concern for occult malignancy  given report of what sounds like an axillary lymph node and possible mass on breast exam. Started on heparin drip in the ED.  - continue lovenox 1mg /kg q12hr  - pharmacy to start coumadin today - will monitor for allergy reaction  - norco 5-325mg  2 tabs q4hr prn - will also have morphine 1mg  q2hr prn available - f/u breast ultrasound for possible malignancy  - f/u hypercoagulability panel  - if no evidence for malignancy will likely switch to Xarelto   # Low serum bicarb: Likely representing acidosis given normal respiratory status.  No gap.  Unclear etiology. - will continue to monitor  # Acute kidney injury: Resolved.  Patient reports baseline from 1.1 to 1.5.  - consult renal called this morning  - f/u prograf level  - monitor with daily BMP   # S/p renal transplant: Seen by Dr. Mercy [name] on Wednesday and told everything going well. Compliant with anti-rejection meds, although did miss one dose the morning of admission.  - consult renal  - continue prograf, myfortic, and prednisone   # Hyperlipidemia  - continue Zocor 10mg    # Anemia: Likely from renal transplant. Receiving Procrit injections every 2 weeks. Reported having a low iron level and that Dr. Mercy [name] was going to given IV iron. Hgb  down some this morning to 9.0. - continue to monitor - follow up with renal   FEN/GI: renal diet, SLIV  Prophylaxis: on therapeutic anticoagulation  Disposition: observation; d/c pending final anticoagulation decision  Code status: Full code   Ariel [name] Pager: [phone] 08/03/2012, 6:56 AM

## 2012-08-03 NOTE — H&P (Signed)
FMTS Attending Admit Note Patient seen and examined by me, discussed with admitting resident and I agree with Dr Cyd Silence assessment and plan as documented. Patient with apparent unprovoked upper extremity DVT in L arm.  Reports prior treatment with warfarin in 2006 for condition that was not a VTE (related to her kidney transplant), at which time she developed a rash.  This is different from other medication allergies/intolerances she has had (Bactrim causes generalized swelling).   This morning she complains of some mild nausea and discomfort in the left arm/shoulder.    Social and family hx as per Dr Cyd Silence note.  Patient clarifies that breast cancer in paternal aunt and paternal grandmother only.  No other malignancy history.  Patient is nonsmoker, Essure for pregnancy prevention since @2010 .  Assess/Plan: Patient with apparent unprovoked UE occlusive subclavian and axillary vein DVT, first-time VTE event.  Agree with hypercoag workup,as well as breast ultrasound.  Vascular vs IR consult for consideration of thrombolytic therapy given acute onset with symptoms. Renal consult in this renal transplant patient.  Dalbert Mayotte, MD

## 2012-08-04 DIAGNOSIS — M79609 Pain in unspecified limb: Secondary | ICD-10-CM | POA: Diagnosis not present

## 2012-08-04 DIAGNOSIS — I82A19 Acute embolism and thrombosis of unspecified axillary vein: Secondary | ICD-10-CM | POA: Diagnosis not present

## 2012-08-04 DIAGNOSIS — I82B19 Acute embolism and thrombosis of unspecified subclavian vein: Secondary | ICD-10-CM | POA: Diagnosis not present

## 2012-08-04 DIAGNOSIS — N179 Acute kidney failure, unspecified: Secondary | ICD-10-CM | POA: Diagnosis not present

## 2012-08-04 DIAGNOSIS — D649 Anemia, unspecified: Secondary | ICD-10-CM | POA: Diagnosis not present

## 2012-08-04 DIAGNOSIS — N183 Chronic kidney disease, stage 3 unspecified: Secondary | ICD-10-CM | POA: Diagnosis not present

## 2012-08-04 DIAGNOSIS — N63 Unspecified lump in unspecified breast: Secondary | ICD-10-CM | POA: Diagnosis not present

## 2012-08-04 DIAGNOSIS — Z940 Kidney transplant status: Secondary | ICD-10-CM | POA: Diagnosis not present

## 2012-08-04 DIAGNOSIS — I824Z9 Acute embolism and thrombosis of unspecified deep veins of unspecified distal lower extremity: Secondary | ICD-10-CM | POA: Diagnosis not present

## 2012-08-04 DIAGNOSIS — E785 Hyperlipidemia, unspecified: Secondary | ICD-10-CM | POA: Diagnosis not present

## 2012-08-04 LAB — RENAL FUNCTION PANEL
CO2: 18 mEq/L — ABNORMAL LOW (ref 19–32)
Calcium: 9.7 mg/dL (ref 8.4–10.5)
Chloride: 106 mEq/L (ref 96–112)
GFR calc Af Amer: 42 mL/min — ABNORMAL LOW (ref >90)
GFR calc non Af Amer: 36 mL/min — ABNORMAL LOW (ref >90)
Potassium: 4 mEq/L (ref 3.5–5.1)
Sodium: 135 mEq/L (ref 135–145)

## 2012-08-04 LAB — BETA-2-GLYCOPROTEIN I ABS, IGG/M/A
Beta-2 Glyco I IgG: 1 G Units (ref <20)
Beta-2-Glycoprotein I IgA: 10 A Units (ref <20)
Beta-2-Glycoprotein I IgM: 8 M Units (ref <20)

## 2012-08-04 LAB — PROTEIN C ACTIVITY: Protein C Activity: 112 % (ref 75–133)

## 2012-08-04 LAB — PROTEIN S ACTIVITY: Protein S Activity: 68 % — ABNORMAL LOW (ref 69–129)

## 2012-08-04 LAB — LUPUS ANTICOAGULANT PANEL
Lupus Anticoagulant: NOT DETECTED
PTT Lupus Anticoagulant: 47.5 secs — ABNORMAL HIGH (ref 28.0–43.0)

## 2012-08-04 LAB — CBC
MCH: 28.1 pg (ref 26.0–34.0)
Platelets: 214 10*3/uL (ref 150–400)
RBC: 3.52 MIL/uL — ABNORMAL LOW (ref 3.87–5.11)
WBC: 7.4 10*3/uL (ref 4.0–10.5)

## 2012-08-04 LAB — PROTIME-INR
INR: 1.4 (ref 0.00–1.49)
Prothrombin Time: 16.8 seconds — ABNORMAL HIGH (ref 11.6–15.2)

## 2012-08-04 MED ORDER — DIPHENHYDRAMINE HCL 25 MG PO CAPS
25.0000 mg | ORAL_CAPSULE | Freq: Four times a day (QID) | ORAL | Status: DC | PRN
  Administered 2012-08-04: 25 mg via ORAL
  Filled 2012-08-04 (×2): qty 1

## 2012-08-04 NOTE — Progress Notes (Signed)
FMTS Daily Progress Note  Subjective: Reports having had pain in her arm overnight, which has improved after getting pain medicine.  I have reviewed the patient's medications.  Objective Temp:  [97.8 F (36.6 C)-98.9 F (37.2 C)] 98.3 F (36.8 C) (12/02 0953) Pulse Rate:  [80-96] 80  (12/02 0953) Resp:  [18-20] 20  (12/02 0953) BP: (111-134)/(64-72) 113/66 mmHg (12/02 0953) SpO2:  [99 %-100 %] 100 % (12/02 0953) Weight:  [147 lb 0.8 oz (66.7 kg)] 147 lb 0.8 oz (66.7 kg) (12/02 0500)   Intake/Output Summary (Last 24 hours) at 08/04/12 1005 Last data filed at 08/03/12 1900  Gross per 24 hour  Intake    480 ml  Output      0 ml  Net    480 ml   General: alert, pleasant, cooperative, NAD HEENT: AT/[place] CV: RRR Pulm: CTAB via anterior auscultation, normal respiratory effort Ext: no LE tenderness to palpation; left arm with 2+ edema, warmth, and erythema (improved per pt) Breast: left 1cm breast mass palpable in the ~8 o clock position Neuro: alert, oriented, no focal deficits  Labs and Imaging  Lab 08/04/12 0600 08/03/12 0340 08/02/12 1558  WBC 7.4 6.1 5.5  HGB 9.9* 9.0* 10.3*  HCT 30.6* 28.7* 33.2*  PLT 214 202 196   Reports baseline Cr around 1.1-1.5; Recent Hgb of 10.2.   Lab 08/04/12 0600 08/03/12 0340 08/02/12 1558  NA 135 135 134*  K 4.0 3.7 3.9  CL 106 107 105  CO2 18* 17* 18*  BUN 20 18 20   CREATININE 1.76* 1.33* 1.40*  LABGLOM -- -- --  GLUCOSE 107* -- --  CALCIUM 9.7 9.3 9.9   Hypercoagulable work up: pending -Anti-thrombin III normal -homocysteine normal  Awaiting results for: Protein C Protein S Lupus anticoagulant B2 glycoprotein Factor V Leiden Cardiolipin Antibody  Tacrolimus level: pending  Assessment and Plan Ariel [name] is a 36 y.o. year old female with renal transplant and graft in left upper extremity presenting with left arm pain and swelling found to have an acute subclavian and axillary veins.   # Acute DVT: Unclear  etiology. Not on OCPs or hormone therapy, no family history of DVT, not likely to be pregnant with Essure. Some concern for occult malignancy given report of what sounds like an axillary lymph node and possible mass on breast exam. Started on heparin drip in the ED.  - currently on lovenox 1mg /kg q12hr, with prior plan of bridging to coumadin. - patient complaining of itching and nausea with coumadin, so will d/c coumadin for now and continue treatment dose Lovenox. Choice of anticoagulant will depend on malignancy workup. If no evidence for malignancy will likely switch to Xarelto. - norco 5-325mg  2 tabs q4hr prn, will also have morphine 1mg  q2hr prn available - f/u hypercoagulability panel   # Low serum bicarb: Likely representing acidosis given normal respiratory status.  No gap.  Unclear etiology. - will continue to monitor  # Acute kidney injury: Resolved.  Patient reports baseline from 1.1 to 1.5.  - will consult renal for input as pt is s/p transplant  - f/u prograf level  - monitor with daily BMP   # S/p renal transplant: Seen by Dr. Mercy Moore on Wednesday and told everything going well. Compliant with anti-rejection meds, although did miss one dose the morning of admission.  - consult renal  - continue prograf, myfortic, and prednisone   # Hyperlipidemia  - continue Zocor 10mg    # Anemia: Likely from renal  transplant. Receiving Procrit injections every 2 weeks. Reported having a low iron level and that Dr. Mercy Moore was going to given IV iron. Hgb stable; 9.9 up from 9.0 yesterday. - continue to monitor - follow up with renal   FEN/GI: renal diet, saline lock IV Prophylaxis: on therapeutic anticoagulation  Disposition: observation; d/c pending final anticoagulation decision  Code status: Full code  Chrisandra Netters, MD Family Medicine PGY-1

## 2012-08-04 NOTE — Progress Notes (Signed)
FMTS Attending Note Patient seen and examined by me, discussed with resident team.  Patient reports marked improvement in the swelling of her left arm; is more mobile today. She states that paternal aunt and paternal grandmother with breast cancer.  My chaperoned breast exam is significant for fibrocystic texture to breast tissue, perhaps an area of fullness along lateral L breast (not discrete mass per se).  No nipple discharge or skin changes. Assess/Plan: Patient with apparent unprovoked UE DVT; the report mentions occlusion of subclavian and axillary veins.  To discuss with IR whether thrombolysis is advisable. Patient does not tolerate warfarin (generalized itch, abd pain).  She has a very low likelihood of malignancy.  I would favor an outpatient imaging of breasts (diagnostic mammogram and [place]), await results of full hypercoag workup.  In the meantime, self-administered lovenox (she voices confidence in administering this herself).  Dalbert Mayotte, MD

## 2012-08-04 NOTE — Progress Notes (Signed)
UR of chart complete.   Spoke with RN about pt administering her own Lovenox. She said she can teach pt and thought she would be able to do it on her own.  If pt unable to do so, RN said she would let me know so that a HHRN could be ordered.  If she gets [facility], they will only go 1-2 times to teach her as well--they will not go daily to give the shot. Pt has Medicare coverage so will only need Rx to fill the Lovenox. She is not eligible for any financial assistance with meds.

## 2012-08-04 NOTE — Consult Note (Signed)
Reason for Consult:  Evaluate for thrombolysis. Referring Physician: Dr. Ermalene Postin Raia is an 36 y.o. female.  HPI: Developed pain and swelling in the left arm on Thanksgiving.  Initially, she thought that she had a pinched nerve or musculoskeletal problem.  Upper extremity duplex demonstrates thrombus in left subclavian and left axillary vein.  Left arm veins are patent.  Patient is currently anticoagulated with Lovenox.  Feels that swelling and redness have slightly improved since being hospitalized and anticoagulated.  History of renal transplant.  There is a left forearm loop graft.  Patient has had previous bilateral jugular HD catheters.    Past Medical History  Diagnosis Date  . Hypercholesterolemia   . 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 Vulvar dystrophy   . History of renal transplant   . Nephrogenic diabetes insipidus     congenital  . Renal failure, chronic     Dialysis in the past  . 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 Kidney transplant 1995, 2008  . Tubal ligation   . Av fistula placement   . Simple vulvectomy 06/19/2011    Partial simple left  . Parathyroidectomy 2004    Portion on the parathyroid gland transplanted in R forearm    Family History  Problem Relation Age of Onset  . Hypertension Mother     Social History:  reports that she has never smoked. She does not have any smokeless tobacco history on file. She reports that she does not drink alcohol or use illicit drugs.  Allergies:  Allergies  Allergen Reactions  . Oxycodone Nausea And Vomiting  . Adhesive (Tape) Rash  . Bactrim Swelling and Rash  . Imuran (Azathioprine Sodium) Rash  . Warfarin Sodium Rash    Current facility-administered medications:0.9 %  sodium chloride infusion, 250 mL, Intravenous, PRN, Sibyl Parr, MD;  acetaminophen (TYLENOL) suppository 650 mg, 650 mg, Rectal, Q6H PRN, Sibyl Parr, MD;  acetaminophen (TYLENOL) tablet 650 mg, 650 mg, Oral, Q6H PRN, Sibyl Parr, MD;  enoxaparin (LOVENOX) injection 65 mg, 1 mg/kg, Subcutaneous, Q12H, Sibyl Parr, MD, 65 mg at 08/04/12 1256 HYDROcodone-acetaminophen (NORCO/VICODIN) 5-325 MG per tablet 1-2 tablet, 1-2 tablet, Oral, Q4H PRN, Sibyl Parr, MD, 2 tablet at 08/04/12 [phone];  magnesium oxide (MAG-OX) tablet 200 mg, 200 mg, Oral, Daily, Willeen Niece, MD, 200 mg at 08/04/12 1256;  morphine 2 MG/ML injection 1 mg, 1 mg, Intravenous, Q2H PRN, Sibyl Parr, MD;  mycophenolate (MYFORTIC) EC tablet 360 mg, 360 mg, Oral, BID, Willeen Niece, MD, 360 mg at 08/04/12 1044 ondansetron (ZOFRAN) injection 4 mg, 4 mg, Intravenous, Q6H PRN, Sibyl Parr, MD;  ondansetron (ZOFRAN) tablet 4 mg, 4 mg, Oral, Q6H PRN, Sibyl Parr, MD;  predniSONE (DELTASONE) tablet 5 mg, 5 mg, Oral, Q breakfast, Sibyl Parr, MD, 5 mg at 08/04/12 0818;  simvastatin (ZOCOR) tablet 10 mg, 10 mg, Oral, Daily, Willeen Niece, MD, 10 mg at 08/04/12 1044 sodium chloride 0.9 % injection 3 mL, 3 mL, Intravenous, Q12H, Sibyl Parr, MD, 3 mL at 08/04/12 1045;  sodium chloride 0.9 % injection 3 mL, 3 mL, Intravenous, PRN, Sibyl Parr, MD;  tacrolimus (PROGRAF) capsule 4 mg, 4 mg, Oral, BID, Sibyl Parr, MD, 4 mg at 08/04/12 1045;  [DISCONTINUED] Warfarin - Pharmacist Dosing Inpatient, , Does not apply, q1800,  Willeen Niece, [id number]  Results for orders placed during the hospital encounter of 08/02/12 (from the past 48 hour(s))  PROTIME-INR     Status: Normal   Collection Time   08/02/12  7:28 PM      Component Value Range Comment   Prothrombin Time 13.0  11.6 - 15.2 seconds    INR 0.99  0.00 - 1.49   APTT     Status: Abnormal   Collection Time   08/02/12  7:28 PM      Component Value Range Comment   aPTT 40 (*) 24 - 37 seconds   PREGNANCY, URINE     Status: Normal   Collection Time   08/02/12 10:13 PM      Component Value Range Comment   Preg Test, Ur NEGATIVE   NEGATIVE   CBC     Status: Abnormal   Collection Time   08/03/12  3:40 AM      Component Value Range Comment   WBC 6.1  4.0 - 10.5 K/uL    RBC 3.31 (*) 3.87 - 5.11 MIL/uL    Hemoglobin 9.0 (*) 12.0 - 15.0 g/dL    HCT 28.7 (*) 36.0 - 46.0 %    MCV 86.7  78.0 - 100.0 fL    MCH 27.2  26.0 - 34.0 pg    MCHC 31.4  30.0 - 36.0 g/dL    RDW 13.8  11.5 - 15.5 %    Platelets 202  150 - 400 K/uL   PROTIME-INR     Status: Normal   Collection Time   08/03/12  3:40 AM      Component Value Range Comment   Prothrombin Time 14.2  11.6 - 15.2 seconds    INR 1.11  0.00 - [id number]   BASIC METABOLIC PANEL     Status: Abnormal   Collection Time   08/03/12  3:40 AM      Component Value Range Comment   Sodium 135  135 - 145 mEq/L    Potassium 3.7  3.5 - 5.1 mEq/L    Chloride 107  96 - 112 mEq/L    CO2 17 (*) 19 - 32 mEq/L    Glucose, Bld 120 (*) 70 - 99 mg/dL    BUN 18  6 - 23 mg/dL    Creatinine, Ser 1.33 (*) 0.50 - 1.10 mg/dL    Calcium 9.3  8.4 - 10.5 mg/dL    GFR calc non Af Amer 51 (*) >90 mL/min    GFR calc Af Amer 59 (*) >90 mL/min   PROTEIN C ACTIVITY     Status: Normal   Collection Time   08/03/12  3:40 AM      Component Value Range Comment   Protein C Activity 112  75 - 133 %   PROTEIN S ACTIVITY     Status: Abnormal   Collection Time   08/03/12  3:40 AM      Component Value Range Comment   Protein S Activity 68 (*) 69 - 129 %   LUPUS ANTICOAGULANT PANEL     Status: Abnormal   Collection Time   08/03/12  3:40 AM      Component Value Range Comment   PTT Lupus Anticoagulant 47.5 (*) 28.0 - 43.0 secs    PTTLA Confirmation NOT APPL  <8.0 secs    PTTLA 4:1 Mix 42.2  28.0 - 43.0 secs    Drvvt 39.8  <42.9 secs    Drvvt confirmation NOT APPL  <1.11  Ratio    dRVVT Incubated 1:1 Mix NOT APPL  <42.9 secs    Lupus Anticoagulant NOT DETECTED  NOT DETECTED   BETA-2-GLYCOPROTEIN I ABS, IGG/M/A     Status: Normal   Collection Time   08/03/12  3:40 AM      Component Value Range Comment   Beta-2  Glyco I IgG 1  <20 G Units    Beta-2-Glycoprotein I IgM 8  <20 M Units    Beta-2-Glycoprotein I IgA 10  <20 A Units   HOMOCYSTEINE     Status: Normal   Collection Time   08/03/12  3:40 AM      Component Value Range Comment   Homocysteine 11.8  4.0 - 15.4 umol/L   CARDIOLIPIN ANTIBODIES, IGG, IGM, IGA     Status: Abnormal   Collection Time   08/03/12  3:40 AM      Component Value Range Comment   Anticardiolipin IgG 10 (*) <23 GPL U/mL    Anticardiolipin IgM 21 (*) <11 MPL U/mL    Anticardiolipin IgA 5 (*) <22 APL U/mL   ANTITHROMBIN III     Status: Normal   Collection Time   08/03/12  3:40 AM      Component Value Range Comment   AntiThromb III Func 89  75 - 120 %   HCG, SERUM, QUALITATIVE     Status: Normal   Collection Time   08/03/12  3:40 AM      Component Value Range Comment   Preg, Serum NEGATIVE  NEGATIVE   PROTIME-INR     Status: Abnormal   Collection Time   08/04/12  6:00 AM      Component Value Range Comment   Prothrombin Time 16.8 (*) 11.6 - 15.2 seconds    INR 1.40  0.00 - 1.49   CBC     Status: Abnormal   Collection Time   08/04/12  6:00 AM      Component Value Range Comment   WBC 7.4  4.0 - 10.5 K/uL    RBC 3.52 (*) 3.87 - 5.11 MIL/uL    Hemoglobin 9.9 (*) 12.0 - 15.0 g/dL    HCT 30.6 (*) 36.0 - 46.0 %    MCV 86.9  78.0 - 100.0 fL    MCH 28.1  26.0 - 34.0 pg    MCHC 32.4  30.0 - 36.0 g/dL    RDW 13.9  11.5 - 15.5 %    Platelets 214  150 - 400 K/uL   RENAL FUNCTION PANEL     Status: Abnormal   Collection Time   08/04/12  6:00 AM      Component Value Range Comment   Sodium 135  135 - 145 mEq/L    Potassium 4.0  3.5 - 5.1 mEq/L    Chloride 106  96 - 112 mEq/L    CO2 18 (*) 19 - 32 mEq/L    Glucose, Bld 107 (*) 70 - 99 mg/dL    BUN 20  6 - 23 mg/dL    Creatinine, Ser 1.76 (*) 0.50 - 1.10 mg/dL    Calcium 9.7  8.4 - 10.5 mg/dL    Phosphorus 2.8  2.3 - 4.6 mg/dL    Albumin 2.9 (*) 3.5 - 5.2 g/dL    GFR calc non Af Amer 36 (*) >90 mL/min    GFR calc Af Amer 42  (*) >90 mL/min     Review of Systems  Constitutional: Negative.   HENT: Negative.   Respiratory: Negative.  Cardiovascular: Negative.   Gastrointestinal: Negative.   Genitourinary: Negative.    Blood pressure 119/64, pulse 90, temperature 98.3 F (36.8 C), temperature source Oral, resp. rate 18, height 4\' 11"  (1.499 m), weight 147 lb 0.8 oz (66.7 kg), SpO2 100.00%. Physical Exam  Constitutional: She appears well-developed and well-nourished.  Cardiovascular: Normal rate and regular rhythm.   Murmur heard. Respiratory: Effort normal and breath sounds normal.  GI: Soft. Bowel sounds are normal.  Left arm:  Enlargement compared to right side.  Warm and red along the medial aspect of elbow and upper arm.  The skin is not tight.  Palpable left radial pulse.  Left forearm graft feels occluded.  Fullness in left axilla.  Assessment/Plan: 36 yo with acute thrombus in left subclavian and left axillary vein.  History of renal disease with prior central venous catheters and left forearm loop graft.  Discussed thrombolytic therapy vs. anticoagulation with the patient in depth.  Due to her age, she may benefit from more aggressive therapy to prevent chronic swelling and pain in her left upper extremity (post thrombotic syndrome).  The etiology for this thrombus is unknown at this time.  The thrombus does not appear to be related to prior dialysis access.  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  She would benefit from a left central venogram and possible thrombolytics/mechanical thrombectomy.  Will need to discuss with Nephrology due to transplant kidney and the need for iodinated contrast.    Kharson Rasmusson RYAN 08/04/2012, 6:59 PM

## 2012-08-04 NOTE — Progress Notes (Signed)
MD made aware of patient refusal to take coumadin.

## 2012-08-05 DIAGNOSIS — I824Z9 Acute embolism and thrombosis of unspecified deep veins of unspecified distal lower extremity: Secondary | ICD-10-CM | POA: Diagnosis not present

## 2012-08-05 DIAGNOSIS — N039 Chronic nephritic syndrome with unspecified morphologic changes: Secondary | ICD-10-CM | POA: Diagnosis not present

## 2012-08-05 DIAGNOSIS — I82B19 Acute embolism and thrombosis of unspecified subclavian vein: Secondary | ICD-10-CM | POA: Diagnosis not present

## 2012-08-05 DIAGNOSIS — I82A19 Acute embolism and thrombosis of unspecified axillary vein: Secondary | ICD-10-CM | POA: Diagnosis not present

## 2012-08-05 DIAGNOSIS — N179 Acute kidney failure, unspecified: Secondary | ICD-10-CM | POA: Diagnosis not present

## 2012-08-05 DIAGNOSIS — D649 Anemia, unspecified: Secondary | ICD-10-CM | POA: Diagnosis not present

## 2012-08-05 DIAGNOSIS — I82409 Acute embolism and thrombosis of unspecified deep veins of unspecified lower extremity: Secondary | ICD-10-CM | POA: Diagnosis not present

## 2012-08-05 DIAGNOSIS — N63 Unspecified lump in unspecified breast: Secondary | ICD-10-CM | POA: Diagnosis not present

## 2012-08-05 DIAGNOSIS — Z940 Kidney transplant status: Secondary | ICD-10-CM | POA: Diagnosis not present

## 2012-08-05 LAB — PROTEIN S, TOTAL: Protein S Ag, Total: 71 % (ref 60–150)

## 2012-08-05 LAB — CBC
MCV: 86.2 fL (ref 78.0–100.0)
Platelets: 209 10*3/uL (ref 150–400)
RDW: 13.6 % (ref 11.5–15.5)
WBC: 6.1 10*3/uL (ref 4.0–10.5)

## 2012-08-05 MED ORDER — MYCOPHENOLATE SODIUM 180 MG PO TBEC
720.0000 mg | DELAYED_RELEASE_TABLET | Freq: Two times a day (BID) | ORAL | Status: DC
  Administered 2012-08-05 – 2012-08-07 (×4): 720 mg via ORAL
  Filled 2012-08-05 (×5): qty 4

## 2012-08-05 MED ORDER — SIMVASTATIN 5 MG PO TABS
5.0000 mg | ORAL_TABLET | Freq: Every day | ORAL | Status: DC
  Administered 2012-08-06 – 2012-08-07 (×2): 5 mg via ORAL
  Filled 2012-08-05 (×2): qty 1

## 2012-08-05 MED ORDER — SODIUM CHLORIDE 0.9 % IV SOLN
1020.0000 mg | Freq: Once | INTRAVENOUS | Status: AC
  Administered 2012-08-05: 1020 mg via INTRAVENOUS
  Filled 2012-08-05: qty 34

## 2012-08-05 NOTE — Progress Notes (Signed)
FMTS Daily Progress Note  Subjective: Reports left arm pain, for which she just received pain medicine. Thinks the swelling has gone down some. Is having pain in her R arm around the IV site. Eating and drinking well. Met with IR physician yesterday regarding thrombolysis, and pt seems to favor it at this time.  I have reviewed the patient's medications.  Objective Temp:  [98.1 F (36.7 C)-98.4 F (36.9 C)] 98.4 F (36.9 C) (12/03 0510) Pulse Rate:  [80-92] 92  (12/03 0510) Resp:  [16-20] 16  (12/03 0510) BP: (113-125)/(64-66) 125/66 mmHg (12/03 0510) SpO2:  [100 %] 100 % (12/03 0510) Weight:  [146 lb 9.7 oz (66.5 kg)] 146 lb 9.7 oz (66.5 kg) (12/03 0510)  Exam: General: alert, pleasant, cooperative, NAD HEENT: AT/[place] CV: RRR, 2/6 systolic murmur loudest at the LUSB Pulm: normal respiratory effort Ext: left arm with 1 to 2+ edema, warmth, and erythema, improved from yesterday Neuro: alert, oriented, speech intact  Labs and Imaging  Lab 08/05/12 0620 08/04/12 0600 08/03/12 0340  WBC 6.1 7.4 6.1  HGB 9.2* 9.9* 9.0*  HCT 28.2* 30.6* 28.7*  PLT 209 214 202   Reports baseline Cr around 1.1-1.5; Recent Hgb of 10.2.   Lab 08/04/12 0600 08/03/12 0340 08/02/12 1558  NA 135 135 134*  K 4.0 3.7 3.9  CL 106 107 105  CO2 18* 17* 18*  BUN 20 18 20   CREATININE 1.76* 1.33* 1.40*  LABGLOM -- -- --  GLUCOSE 107* -- --  CALCIUM 9.7 9.3 9.9   Hypercoagulable work up: pending -Anti-thrombin III normal -homocysteine normal -Protein C activity WNL -Protein S activity slightly low at 68% (normal range 69-129%) -PTT lupus anticoagulant high, but no lupus anticoagulant detected -B2 Glycoprotein Ab's negative -Anticardiolipin Ab IgM positive  Awaiting results for: Protein C total Protein S total Factor V Leiden  Tacrolimus level: pending  INR 1.95  Assessment and Plan Ariel [name] is a 36 y.o. year old female with renal transplant and graft in left upper extremity  presenting with left arm pain and swelling found to have an acute subclavian and axillary veins.   # Acute DVT: Unclear etiology. Not on OCPs or hormone therapy, no family history of DVT, not likely to be pregnant with Essure. Some concern for occult malignancy given report of what sounds like an axillary lymph node and possible mass on breast exam. Started on heparin drip in the ED.  - currently on lovenox 1mg /kg q12hr, with prior plan of bridging to coumadin, but pt does not tolerate coumadin. - will likely start xarelto prior to discharge, but continue lovenox therapeutic dosing for now. - norco 5-325mg  2 tabs q4hr prn, will also have morphine 1mg  q2hr prn available - f/u results of hypercoagulability panel  - IR consulted yesterday for possible thrombolysis/thrombectomy. Awaiting renal input regarding whether this will be safe given hx of renal transplant.  # Low serum bicarb: Likely representing acidosis given normal respiratory status.  No gap.  Unclear etiology. - will continue to monitor  # Acute kidney injury:Patient reports baseline from 1.1 to 1.5.  - will consult renal for input as pt is s/p transplant  - f/u prograf level  - Creatinine bumped to 1.76 today (up from 1.33 yesterday). Continue to monitor with daily BMP   # S/p renal transplant: Seen by Dr. Mercy Moore on Wednesday and told everything going well. Compliant with anti-rejection meds, although did miss one dose the morning of admission.  - consult renal for any further  input - continue prograf, myfortic, and prednisone   # Hyperlipidemia  - continue Zocor 10mg    # Anemia: Likely from renal transplant. Receiving Procrit injections every 2 weeks. Reported having a low iron level and that Dr. Mercy Moore was going to give IV iron. Hgb stable; 9.2 (previously was 9.9, 9.0 over last two days) - continue to monitor with CBCs. - follow up with renal   FEN/GI: renal diet, saline lock IV Prophylaxis: on therapeutic  anticoagulation with lovenox Disposition: floor status; d/c pending final anticoagulation decision +/- thrombolysis  Code status: Full code  Chrisandra Netters, MD Family Medicine PGY-1

## 2012-08-05 NOTE — Progress Notes (Signed)
Patient complaints " I felt something funny in my chest, It felt like my heart is fluttering". Vital signs taken and recorded, not in any distress, O2 sat 100 %. Endorse to incoming nurse. MD on call made aware, no new order.

## 2012-08-05 NOTE — Progress Notes (Signed)
FMTS Attending Case discussed with resident team, I agree with management plan.  Patient with acute LUE subclavian and axillary vein occlusion; some abnormalities in her hypercoag w/u thus far.  IR to evaluate for possible thrombolytic therapy.  In the meantime, patient on Lovenox, for breast imaging (fibrocystic texture vs focal nodularity in lateral aspect L breast) as outpatient and then consideration of rivaroxaban in patient with intolerance to warfarin (itch and rash).  Dalbert Mayotte, MD

## 2012-08-05 NOTE — Consult Note (Signed)
Ariel [name] is an 36 y.o. 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  I just saw pt last wed and labs lokked good with no CO.  Lt UA AVG was working.  On thurs she woke up with mild pain in Lt arm and the noticed swelling of arm.  She called the office on Fri with CO the pain and said her AVG was working?  Now she presents with persistent pain and swelling and duplex shows acute DVT Lt subclavian and axillary veins.  Past Medical History  Diagnosis Date  . Hypercholesterolemia   . 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 Vulvar dystrophy   . History of renal transplant   . Nephrogenic diabetes insipidus     congenital  . Renal failure, chronic     Dialysis in the past  . 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 Kidney transplant 1995, 2008  . Tubal ligation   . Av fistula placement   . Simple vulvectomy 06/19/2011    Partial simple left  . Parathyroidectomy 2004    Portion on the parathyroid gland transplanted in R forearm    Family History  Problem Relation Age of Onset  . Hypertension Mother    Social History:  reports that she has never smoked. She does not have any smokeless tobacco history on file. She reports that she does not drink alcohol or use illicit drugs.  Allergies:  Allergies  Allergen Reactions  . Oxycodone Nausea And Vomiting  . Adhesive (Tape) Rash  . Bactrim Swelling and Rash  . Imuran (Azathioprine Sodium) Rash  . Warfarin Sodium Rash    Medications Prior to Admission  Medication Sig Dispense Refill  . Magnesium 200 MG TABS Take 1 tablet by mouth daily.        . mycophenolate (MYFORTIC) 360 MG TBEC Take 360 mg by mouth 2 (two) times daily.       . predniSONE (DELTASONE) 5 MG tablet Take 5 mg by mouth daily.        . simvastatin (ZOCOR) 10 MG tablet Take 10 mg by mouth  daily.       . tacrolimus (PROGRAF) 1 MG capsule Take 4 mg by mouth 2 (two) times daily.           Lab Results: UA: ND  Basename 08/05/12 0620 08/04/12 0600 08/03/12 0340  WBC 6.1 7.4 6.1  HGB 9.2* 9.9* 9.0*  HCT 28.2* 30.6* 28.7*  PLT 209 214 202   BMET  Basename 08/04/12 0600 08/03/12 0340 08/02/12 1558  NA 135 135 134*  K 4.0 3.7 3.9  CL 106 107 105  CO2 18* 17* 18*  GLUCOSE 107* 120* 89  BUN 20 18 20   CREATININE 1.76* 1.33* 1.40*  CALCIUM 9.7 9.3 9.9  PHOS 2.8 -- --   LFT  Basename 08/04/12 0600  PROT --  ALBUMIN 2.9*  AST --  ALT --  ALKPHOS --  BILITOT --  BILIDIR --  IBILI --   No results found.  ROS: Appetite good No sob No cp No change in bowels Swelling LT arm improved but still present No new jt pain No change vision No dysuria   PHYSICAL EXAM: Blood pressure 119/66, pulse 94, temperature 98.5 F (36.9 C), temperature source  Oral, resp. rate 18, height 4\' 11"  (1.499 m), weight 66.5 kg (146 lb 9.7 oz), SpO2 100.00%. HEENT: PERRLA EOMI NECK:no jvd no bruits LUNGS:clear CARDIAC:RRR ABD:+BS NTND TX RLQ nontender, no bruits [id number] swelling of lt upper arm with mild erythema over her AVG.  The AVG is clotted! NEURO:CNI M&SI no asterixis  Assessment: 1. Lt upper arm DVT that I suspect originated from clotting of her AVG 2. Renal Tx, baseline Scr low 1's   Slightly up today to 1.7 3. Anemia on procrit.  Iron studies were low as out pt and she is due to get IV iron PLAN: 1. Push fluids and recheck Scr in am 2. I would not pursue thrombectomy of the AVG as her Sxs are improving and tolerable and the risk of dye on her transplant outweighs the benefit.  I think 3- 6 months of anti coagulation is reasonable 3. Change zocor dose to 5mg /d 4. Will give IV iron while here 5. She claims an allergy to coumadin and so a different po anticoagulant will need to be used 6. Her myfotic dose is 720mg  bid 7. She does not need renal diet at this  time   Kearney Evitt T 08/05/2012, 3:40 PM

## 2012-08-05 NOTE — Progress Notes (Signed)
Patient ID: Ariel [name], female   DOB: [birth year], 36 y.o.   MRN: [id number]  Reviewed case with Dr. Anselm Pancoast.  Pt is a candidate for lytic therapy but will receive contrast during the IR exam.  Needs to be cleared by nephrology before proceeding since the pt has a renal transplant.

## 2012-08-05 NOTE — Progress Notes (Signed)
Patient ID: Ariel [name], female   DOB: 1976-08-18, 36 y.o.   MRN: [id number] S: Patient complained of feeling her heart "flutter" for about 10 minutes, while she was receiving her iron IV. She denies chest pain, shortness of breath or dyspnea. Per report from the nurse, the patient vitals remained stable during the entire event. The patient denies any current discomfort.  O BP 111/65  Pulse 77  Temp 98.5 F (36.9 C) (Oral)  Resp 18  Ht 4\' 11"  (1.499 m)  Wt 146 lb 9.7 oz (66.5 kg)  BMI 29.61 kg/m2  SpO2 100% Gen: NAD. Sitting up in bed, talking to two guest. Rather perky and talkative.  CV: RRR. 2/6 SM. No gallops or rubs.  Lungs: CTAB. No wheezing, rhonchi or crackles.  Ext: Continuous pain in her left arm, that has been constant since admission and TTP.    A/P: - Probable PVC. Will obtain EKG to rule out arrhythmia side effect of iron.

## 2012-08-06 DIAGNOSIS — I824Z9 Acute embolism and thrombosis of unspecified deep veins of unspecified distal lower extremity: Secondary | ICD-10-CM | POA: Diagnosis not present

## 2012-08-06 DIAGNOSIS — D649 Anemia, unspecified: Secondary | ICD-10-CM | POA: Diagnosis not present

## 2012-08-06 DIAGNOSIS — N179 Acute kidney failure, unspecified: Secondary | ICD-10-CM | POA: Diagnosis not present

## 2012-08-06 DIAGNOSIS — I82409 Acute embolism and thrombosis of unspecified deep veins of unspecified lower extremity: Secondary | ICD-10-CM | POA: Diagnosis not present

## 2012-08-06 DIAGNOSIS — Z940 Kidney transplant status: Secondary | ICD-10-CM | POA: Diagnosis not present

## 2012-08-06 DIAGNOSIS — N63 Unspecified lump in unspecified breast: Secondary | ICD-10-CM | POA: Diagnosis not present

## 2012-08-06 LAB — RENAL FUNCTION PANEL
BUN: 25 mg/dL — ABNORMAL HIGH (ref 6–23)
CO2: 17 mEq/L — ABNORMAL LOW (ref 19–32)
GFR calc Af Amer: 38 mL/min — ABNORMAL LOW (ref >90)
Glucose, Bld: 84 mg/dL (ref 70–99)
Phosphorus: 3.4 mg/dL (ref 2.3–4.6)
Potassium: 4.1 mEq/L (ref 3.5–5.1)

## 2012-08-06 LAB — URINALYSIS, ROUTINE W REFLEX MICROSCOPIC
Glucose, UA: NEGATIVE mg/dL
Ketones, ur: NEGATIVE mg/dL
Leukocytes, UA: NEGATIVE
Nitrite: NEGATIVE
Specific Gravity, Urine: 1.015 (ref 1.005–1.030)
pH: 6.5 (ref 5.0–8.0)

## 2012-08-06 LAB — CBC
HCT: 27.3 % — ABNORMAL LOW (ref 36.0–46.0)
Hemoglobin: 9 g/dL — ABNORMAL LOW (ref 12.0–15.0)
MCH: 28.5 pg (ref 26.0–34.0)
MCHC: 33 g/dL (ref 30.0–36.0)
RBC: 3.16 MIL/uL — ABNORMAL LOW (ref 3.87–5.11)

## 2012-08-06 LAB — SODIUM, URINE, RANDOM: Sodium, Ur: 78 mEq/L

## 2012-08-06 LAB — URINE MICROSCOPIC-ADD ON

## 2012-08-06 LAB — CREATININE, URINE, RANDOM: Creatinine, Urine: 104.72 mg/dL

## 2012-08-06 MED ORDER — STERILE WATER FOR INJECTION IV SOLN
INTRAVENOUS | Status: DC
  Administered 2012-08-06 – 2012-08-07 (×2): via INTRAVENOUS
  Filled 2012-08-06 (×3): qty 9.7

## 2012-08-06 NOTE — Progress Notes (Signed)
FMTS Daily Progress Note  Subjective: Yesterday evening patient experienced some palpitations while getting an iron infusion. An EKG at that time was WNL. She reports that she thinks the swelling in her arm has gone down. She did wake up crying overnight after sleeping on her arm in a strange position, with pain going all down her arm into her hand. After she got some pain medicine, this got better. She is aware that we are not going to pursue thrombolysis of her DVT out of concern for her transplanted kidney.  I have reviewed the patient's medications.  Objective Temp:  [98.4 F (36.9 C)-98.5 F (36.9 C)] 98.4 F (36.9 C) (12/03 2113) Pulse Rate:  [72-94] 72  (12/03 2113) Resp:  [18-19] 19  (12/03 2113) BP: (111-122)/(65-73) 122/73 mmHg (12/03 2113) SpO2:  [100 %] 100 % (12/03 2113)  Exam: General: asleep but awakens to voice, pleasant, cooperative, NAD HEENT: AT/[place] CV: RRR, 2/6 systolic murmur loudest at the LUSB Pulm: normal respiratory effort Ext: left arm with 1 to 2+ edema, warmth, and erythema, improved again from yesterday, lower extremities nontender to palpation Neuro: alert, oriented, speech intact  Labs and Imaging  Lab 08/06/12 0440 08/05/12 0620 08/04/12 0600  WBC 5.0 6.1 7.4  HGB 9.0* 9.2* 9.9*  HCT 27.3* 28.2* 30.6*  PLT 258 209 214   Reports baseline Cr around 1.1-1.5; Recent Hgb of 10.2.   Lab 08/06/12 0440 08/04/12 0600 08/03/12 0340  NA 133* 135 135  K 4.1 4.0 3.7  CL 106 106 107  CO2 17* 18* 17*  BUN 25* 20 18  CREATININE 1.90* 1.76* 1.33*  LABGLOM -- -- --  GLUCOSE 84 -- --  CALCIUM 10.3 9.7 9.3   Hypercoagulable work up:  -Anti-thrombin III normal -homocysteine normal -Protein C activity WNL -Protein S activity slightly low at 68% (normal range 69-129%) -PTT lupus anticoagulant high, but no lupus anticoagulant detected -B2 Glycoprotein Ab's negative -Anticardiolipin Ab IgM positive -Protein C total low at 54% (low, reference range  72-160) -Protein S total normal at 71% -Factor V Leiden negative  Tacrolimus level: 4.3 (therapeutic steady state)  INR 1.95  Assessment and Plan Ariel [name] is a 36 y.o. year old female with renal transplant and graft in left upper extremity presenting with left arm pain and swelling found to have an acute subclavian and axillary veins.   # Acute DVT: Unclear etiology. Not on OCPs or hormone therapy, no family history of DVT, not likely to be pregnant with Essure. Some concern for occult malignancy given report of what sounds like an axillary lymph node and possible mass on breast exam. Started on heparin drip in the ED.  - considered thrombolysis and after speaking with IR and nephrology, it would be best not to do this as it would put her renal transplant at risk for kidney injury secondary to contrast - currently on lovenox 1mg /kg q12hr, with prior plan of bridging to coumadin, but pt does not tolerate coumadin. - Hematology (Dr. Jamse Arn) was consulted by phone today for assistance with interpreting hypercoag panel and anticoagulation. Hypercoagulability workup showed a few lab abnormalities (protein C total, protein S activity, PTT lupus anticoagulant, anticardiolipin IgM), which Dr. Jamse Arn would like to f/u as an outpatient. - After speaking with Dr. Jamse Arn today, will initiate Xarelto tomorrow, pending stable renal function.  - Have spoken with nephrology (Dr. Mercy Moore) who agrees with this plan  # Pain control: - norco 5-325mg  2 tabs q4hr prn, will also have morphine 1mg   q2hr prn available  # Low serum bicarb: Likely representing acidosis given normal respiratory status.  No gap.  Unclear etiology. - per nephrology recs, will hydrate with bicarb-containing fluids  # Acute kidney injury:Patient reports baseline from 1.1 to 1.5.  - nephrology following, appreciate recommendations - Creatinine bumped to 1.9 today (up from 1.33 yesterday).  - Hydrate per nephrology  recommendations. Continue to monitor with daily BMP   # S/p renal transplant: Seen by Dr. Mercy Moore on Wednesday and told everything going well. Compliant with anti-rejection meds, although did miss one dose the morning of admission.  - renal aware, following - prograf level therapeutic - continue prograf, myfortic, and prednisone   # Hyperlipidemia  - continue Zocor 5mg  (decreased from 10mg  by renal)  # Anemia: Likely from renal transplant. Receiving Procrit injections every 2 weeks. Reported having a low iron level and that Dr. Mercy Moore was going to give IV iron. Hgb stable; 9.2 (previously was 9.9, 9.0 over last two days) - continue to monitor with CBCs. - follow up with renal   FEN/GI: renal diet, saline lock IV Prophylaxis: on therapeutic anticoagulation with lovenox Disposition: floor status; d/c pending improved renal function & initiation of oral anticoagulation Code status: Full code  Chrisandra Netters, MD Family Medicine PGY-1

## 2012-08-06 NOTE — Progress Notes (Signed)
Reviewed and agree with this management.

## 2012-08-06 NOTE — Progress Notes (Signed)
Staff called this Probation officer to check patient, when this Probation officer went topatient room,patient was crying and saying "My whole body is achy and I want to be off of this IV fluid." Explained to the patient that she needed it per her nephrology but patient crying and saying please unhook me from this . MD made aware that patient do not want to be hook back to IVF to this time probably later. MD to check and come talk to patient. Will continue to monitor.

## 2012-08-06 NOTE — Progress Notes (Signed)
FMTS Attending Note Patient case reviewed and discussed with resident team; I agree with plan for Xarelto treatment and outpatient hematology consult, to assist with long-term management of her anticoagulation and interpretation of hypercoag workup.  Dalbert Mayotte, MD

## 2012-08-06 NOTE — Progress Notes (Signed)
S:some pain in lt arm last night, better now O:BP 122/73  Pulse 72  Temp 98.4 F (36.9 C) (Oral)  Resp 19  Ht 4\' 11"  (1.499 m)  Wt 66.5 kg (146 lb 9.7 oz)  BMI 29.61 kg/m2  SpO2 100%  Intake/Output Summary (Last 24 hours) at 08/06/12 0921 Last data filed at 08/05/12 1900  Gross per 24 hour  Intake    480 ml  Output      0 ml  Net    480 ml   Weight change:  [id number] and alert CVS:RRR with [id number] systolic murmur Resp:clear Abd:+BS NTND Ext:Lt arm swelling about the same NEURO:CNI OX3      . enoxaparin (LOVENOX) injection  1 mg/kg Subcutaneous Q12H  . [COMPLETED] ferumoxytol  1,020 mg Intravenous Once  . magnesium oxide  200 mg Oral Daily  . mycophenolate  720 mg Oral BID  . predniSONE  5 mg Oral Q breakfast  . simvastatin  5 mg Oral Daily  . sodium chloride  3 mL Intravenous Q12H  . tacrolimus  4 mg Oral BID  . [DISCONTINUED] mycophenolate  360 mg Oral BID  . [DISCONTINUED] simvastatin  10 mg Oral Daily   No results found. BMET    Component Value Date/Time   NA 133* 08/06/2012 0440   K 4.1 08/06/2012 0440   CL 106 08/06/2012 0440   CO2 17* 08/06/2012 0440   GLUCOSE 84 08/06/2012 0440   BUN 25* 08/06/2012 0440   CREATININE 1.90* 08/06/2012 0440   CALCIUM 10.3 08/06/2012 0440   GFRNONAA 33* 08/06/2012 0440   GFRAA 38* 08/06/2012 0440   CBC    Component Value Date/Time   WBC 5.0 08/06/2012 0440   RBC 3.16* 08/06/2012 0440   HGB 9.0* 08/06/2012 0440   HCT 27.3* 08/06/2012 0440   PLT 258 08/06/2012 0440   MCV 86.4 08/06/2012 0440   MCH 28.5 08/06/2012 0440   MCHC 33.0 08/06/2012 0440   RDW 13.5 08/06/2012 0440   LYMPHSABS 1.1 08/02/2012 1558   MONOABS 0.4 08/02/2012 1558   EOSABS 0.0 08/02/2012 1558   BASOSABS 0.0 08/02/2012 1558     Assessment: 1. Lt Axillary/subclavian/AVF thrombosis 2. Acute on CKD3 in renal transplant 3. Anemia on procrit.  SP feraheme infusion  Plan: 1.IV fluids with some bicarb 2. Check UA 3. Check prograf level 4. I/O's 5.Recheck labs  in AM   Ariel [name] T

## 2012-08-07 DIAGNOSIS — D631 Anemia in chronic kidney disease: Secondary | ICD-10-CM | POA: Diagnosis not present

## 2012-08-07 DIAGNOSIS — I824Z9 Acute embolism and thrombosis of unspecified deep veins of unspecified distal lower extremity: Secondary | ICD-10-CM | POA: Diagnosis not present

## 2012-08-07 DIAGNOSIS — I82409 Acute embolism and thrombosis of unspecified deep veins of unspecified lower extremity: Secondary | ICD-10-CM | POA: Diagnosis not present

## 2012-08-07 DIAGNOSIS — N63 Unspecified lump in unspecified breast: Secondary | ICD-10-CM | POA: Diagnosis not present

## 2012-08-07 DIAGNOSIS — N179 Acute kidney failure, unspecified: Secondary | ICD-10-CM | POA: Diagnosis not present

## 2012-08-07 DIAGNOSIS — Z940 Kidney transplant status: Secondary | ICD-10-CM | POA: Diagnosis not present

## 2012-08-07 DIAGNOSIS — D649 Anemia, unspecified: Secondary | ICD-10-CM | POA: Diagnosis not present

## 2012-08-07 LAB — CBC
HCT: 29.8 % — ABNORMAL LOW (ref 36.0–46.0)
Hemoglobin: 9.6 g/dL — ABNORMAL LOW (ref 12.0–15.0)
MCH: 27.7 pg (ref 26.0–34.0)
MCHC: 32.2 g/dL (ref 30.0–36.0)
MCV: 86.1 fL (ref 78.0–100.0)
RBC: 3.46 MIL/uL — ABNORMAL LOW (ref 3.87–5.11)

## 2012-08-07 LAB — RENAL FUNCTION PANEL
BUN: 24 mg/dL — ABNORMAL HIGH (ref 6–23)
CO2: 19 mEq/L (ref 19–32)
Calcium: 10.1 mg/dL (ref 8.4–10.5)
Creatinine, Ser: 1.74 mg/dL — ABNORMAL HIGH (ref 0.50–1.10)
GFR calc Af Amer: 43 mL/min — ABNORMAL LOW (ref >90)
Glucose, Bld: 72 mg/dL (ref 70–99)
Phosphorus: 4.2 mg/dL (ref 2.3–4.6)
Sodium: 137 mEq/L (ref 135–145)

## 2012-08-07 LAB — PROTIME-INR: Prothrombin Time: 18.5 seconds — ABNORMAL HIGH (ref 11.6–15.2)

## 2012-08-07 MED ORDER — SIMVASTATIN 10 MG PO TABS: 5.0000 mg | ORAL_TABLET | Freq: Every day | ORAL | Status: DC

## 2012-08-07 MED ORDER — RIVAROXABAN 15 MG PO TABS: 15.0000 mg | ORAL_TABLET | Freq: Two times a day (BID) | ORAL | Status: DC

## 2012-08-07 MED ORDER — RIVAROXABAN 15 MG PO TABS
15.0000 mg | ORAL_TABLET | Freq: Two times a day (BID) | ORAL | Status: DC
  Filled 2012-08-07 (×3): qty 1

## 2012-08-07 MED ORDER — RIVAROXABAN 20 MG PO TABS: 20.0000 mg | ORAL_TABLET | Freq: Every day | ORAL | Status: DC

## 2012-08-07 NOTE — Progress Notes (Signed)
FMTS attending Note Patient seen and examined by me today; she is markedly improved.  I discussed her care with the resident team and I agree with resident plan for today as documented in note.  Dalbert Mayotte, MD

## 2012-08-07 NOTE — Progress Notes (Signed)
Late entry progress note:  Late yesterday afternoon (around 4:30pm) after getting call from RN, went to see patient. She was tearful and expressed disappointment at her rising creatinine. She complained of pain in her left leg, which she attributed to the IV fluids but that improved with walking.   Explained to her our plan, and that the fluids were to help decrease her creatinine and correct her bicarb, and that if her creatinine looked better, we might be able to send her home the next day. Offered to have her walk the hall with the IV pole to help with the leg pain.  She was amenable to this plan, so I contacted RN to have her hooked back up to IV with permission to walk the hall.

## 2012-08-07 NOTE — Progress Notes (Signed)
FMTS Daily Progress Note  Subjective: Yesterday afternoon patient became upset and wanted to stop her IV fluids, complained of left leg pain. After speaking with her, we reconnected her IVF and allowed her to walk the hall to help the leg pain. This morning she says she is feeling better and that her leg pain has improved.  I have reviewed the patient's medications.  Objective Temp:  [97.8 F (36.6 C)-98.1 F (36.7 C)] 97.8 F (36.6 C) (12/05 0444) Pulse Rate:  [66-76] 66  (12/05 0444) Resp:  [18] 18  (12/05 0444) BP: (116-129)/(68-82) 127/82 mmHg (12/05 0444) SpO2:  [98 %-100 %] 100 % (12/05 0444) Weight:  [142 lb 10.2 oz (64.7 kg)] 142 lb 10.2 oz (64.7 kg) (12/05 0444)  Exam: General: awake, pleasant, cooperative, NAD HEENT: AT/[place] CV: RRR, 2/6 systolic murmur loudest at the LUSB Pulm: normal respiratory effort Abd: NTTP, soft Ext: left arm with 1+ edema, warmth, and erythema, improved again from yesterday Neuro: alert, oriented, speech intact  Labs and Imaging  Lab 08/07/12 0445 08/06/12 0440 08/05/12 0620  WBC 4.5 5.0 6.1  HGB 9.6* 9.0* 9.2*  HCT 29.8* 27.3* 28.2*  PLT 307 258 209   Reports baseline Cr around 1.1-1.5; Recent Hgb of 10.2.   Lab 08/07/12 0445 08/06/12 0440 08/04/12 0600  NA 137 133* 135  K 3.9 4.1 4.0  CL 106 106 106  CO2 19 17* 18*  BUN 24* 25* 20  CREATININE 1.74* 1.90* 1.76*  LABGLOM -- -- --  GLUCOSE 72 -- --  CALCIUM 10.1 10.3 9.7   Hypercoagulable work up:  -Anti-thrombin III normal -homocysteine normal -Protein C activity WNL -Protein S activity slightly low at 68% (normal range 69-129%) -PTT lupus anticoagulant high, but no lupus anticoagulant detected -B2 Glycoprotein Ab's negative -Anticardiolipin Ab IgM positive -Protein C total low at 54% (low, reference range 72-160) -Protein S total normal at 71% -Factor V Leiden negative  Tacrolimus level: 4.3 (therapeutic steady state)  INR 1.59  Assessment and Plan Rovena Ochs Tuckett  is a 36 y.o. year old female with renal transplant and graft in left upper extremity presenting with left arm pain and swelling found to have an acute subclavian and axillary veins.   # Acute DVT: Unclear etiology. Not on OCPs or hormone therapy, no family history of DVT, not likely to be pregnant with Essure. Some concern for occult malignancy given report of what sounds like an axillary lymph node and possible mass on breast exam. Started on heparin drip in the ED.  - considered thrombolysis and after speaking with IR and nephrology, it would be best not to do this as it would put her renal transplant at risk for kidney injury secondary to contrast - currently on lovenox 1mg /kg q12hr, with prior plan of bridging to coumadin, but pt does not tolerate coumadin. - Hematology (Dr. Jamse Arn) was consulted by phone yesterday for assistance with interpreting hypercoag panel and anticoagulation. Hypercoagulability workup showed a few lab abnormalities (protein C total, protein S activity, PTT lupus anticoagulant, anticardiolipin IgM), which Dr. Jamse Arn would like to f/u as an outpatient. Planned to start Xarelto today if renal function stable. - Will speak with nephrology (Dr. Mercy Moore) for final go-ahead on starting Xarelto, as creatinine is improved (1.74) but still elevated.  # L Breast mass: - Will f/u this up as an outpatient with ultrasound & mammogram  # Pain control: - norco 5-325mg  2 tabs q4hr prn, will also have morphine 1mg  q2hr prn available  # Low  serum bicarb: Likely representing acidosis given normal respiratory status.  No gap.  Unclear etiology. - per nephrology recs, will hydrate with bicarb-containing fluids - bicarb improved (19) now.  # Acute kidney injury:Patient reports baseline from 1.1 to 1.5.  - nephrology following, appreciate recommendations - Creatinine slightly improved today (1.74) from 1.9 yesterday - Hydrate per nephrology recommendations. Continue to monitor with daily  BMP   # S/p renal transplant: Seen by Dr. Mercy Moore on Wednesday and told everything going well. Compliant with anti-rejection meds, although did miss one dose the morning of admission.  - renal aware, following - prograf level therapeutic, rechecking today - continue prograf, myfortic, and prednisone   # Hyperlipidemia  - continue Zocor 5mg  (decreased from 10mg  by renal)  # Anemia: Likely from renal transplant. Receiving Procrit injections every 2 weeks. Reported having a low iron level and that Dr. Mercy Moore was going to give IV iron. Hgb stable; 9.2 (previously was 9.9, 9.0 over last two days) - continue to monitor with CBCs. - follow up with renal   FEN/GI: renal diet, saline lock IV Prophylaxis: on therapeutic anticoagulation with lovenox Disposition: floor status; d/c pending nephrology clearance and initiation of oral anticoagulation Code status: Full code  Chrisandra Netters, MD Family Medicine PGY-1

## 2012-08-07 NOTE — Discharge Summary (Signed)
[facility] Discharge Summary  Patient name: Ariel [name] Medical record number: [id number] Date of birth: 04-16-1976 Age: 36 y.o. Gender: female Date of Admission: 08/02/2012  Date of Discharge: 08/07/12 Admitting Physician: Willeen Niece, MD  Primary Care Provider: Jenean Lindau, MD  Indication for Hospitalization: Acute DVT of left upper extremity Discharge Diagnoses:  1. DVT of left upper extremity (subclavian & axillary veins) 2. S/p renal transplant 3. Hyperlipidemia 4. Breast mass (L) 5. Acute kidney injury 6. Anemia  Consultations: Nephrology, Interventional Radiology, Hematology  Significant Labs and Imaging:   Left Upper Extremity Venous Duplex: Findings consistent with acute deep vein thrombosis involving the subclavian and axillary veins of the left upper extremity.  Hypercoagubility Workup: -Anti-thrombin III normal  -homocysteine normal  -Protein C activity WNL  -Protein S activity slightly low at 68% (normal range 69-129%)  -PTT lupus anticoagulant high, but no lupus anticoagulant detected  -B2 Glycoprotein Ab's negative  -Anticardiolipin Ab IgM positive  -Protein C total low at 54% (low, reference range 72-160)  -Protein S total normal at 71%  -Factor V Leiden negative   Tacrolimus level: 4.3 (therapeutic steady state) Blood cultures x2: no growth (final)  Procedures: none  Brief Hospital Course:  Ariel [name] is a 36 y.o. female who is s/p renal transplant with a graft in left upper extremity who presented to the ED with left arm pain and swelling and was found to have an acute DVT involving the subclavian and axillary veins.   # DVT: She was started on a heparin drip in the ED and then admitted to the hospital. Because this was essentially an unprovoked DVT, hypercoagubility testing was performed to determine any underlying predisposition for clotting. Upon admission, her heparin drip was transitioned to  therapeutic Lovenox dosing of 1 mg/kg BID, and coumadin was initiated. Patient did report a history of rash with coumadin, but the decision was made to give a trial of coumadin with close monitoring for any allergic reaction. She did develop itching, so the coumadin was discontinued. Interventional Radiology was consulted for possible thrombolysis or thrombectomy, however, after discussion with Nephrology, it was decided that the risks of IV contrast required for thrombolysis outweighed the possible benefit of the procedure. Hypercoagubility testing showed some abnormalities in proteins C and S, and a detected antibody to anticardiolipin. After consulting with Dr. Jamse Arn of Hematology and Dr. Mercy Moore of Nephrology, it was decided that the patient would be discharged on Xarelto, and will follow up with Hematology as an outpatient.  # Acute Renal Injury: Patient's creatinine was 1.4 on admission and trended as high as 1.9, which improved with IV hydration. Was 1.74 on the day of discharge. Additionally, bicarb was low throughout hospitalization (16-17). Pt received bicarb containing fluid overnight prior to discharge, and bicarb had improved to 19.  # Breast Mass: Upon admission patient reported feeling a non-tender, mobile lump in the axilla for the previous month but had not found any suspicious masses on self breast exam. She does report a family history of breast cancer. Her admission exam did show a ~1cm mass in the 8-9oclock position of the left breast. After discussion with attending physician Dr. Lindell Noe, who also examined this mass, it was decided to defer any workup of the mass to the outpatient setting.  # Anemia: Patient received IV iron on 12/3, and experienced palpitations during the infusion. An EKG at that time was WNL.  Discharge Medications:    Medication List     As  of 08/10/2012  1:00 AM    TAKE these medications         Magnesium 200 MG Tabs   Take 1 tablet by mouth daily.       mycophenolate 360 MG Tbec   Commonly known as: MYFORTIC   Take 360 mg by mouth 2 (two) times daily.      predniSONE 5 MG tablet   Commonly known as: DELTASONE   Take 5 mg by mouth daily.      Rivaroxaban 15 MG Tabs tablet   Commonly known as: XARELTO   Take 1 tablet (15 mg total) by mouth 2 (two) times daily with a meal. For 3 weeks. Then take 20mg  PO QD with food (separate rx for 20mg ).      Rivaroxaban 20 MG Tabs   Commonly known as: XARELTO   Take 1 tablet (20 mg total) by mouth daily. Take with food. Start on 08/28/12. Will need further refills from primary doctor.      simvastatin 10 MG tablet   Commonly known as: ZOCOR   Take 0.5 tablets (5 mg total) by mouth daily.      tacrolimus 1 MG capsule   Commonly known as: PROGRAF   Take 4 mg by mouth 2 (two) times daily.        Issues for Follow Up:  -Will need continued f/u with Dr. Mercy Moore of nephrology for management of renal transplant -Will need f/u with hematology (see appt time below) for further interpretation & evaluation of hypercoagubility workup -Recommend breast ultrasound & mammogram of L breast as an outpatient as 1cm mass was noted upon admission  Outstanding Results: none  Discharge Instructions: Please refer to Patient Instructions section of EMR for full details.  Patient was counseled important signs and symptoms that should prompt return to medical care, changes in medications, dietary instructions, activity restrictions, and follow up appointments.  Significant instructions noted below:  Follow-up Information    Schedule an appointment as soon as possible for a visit with CAMPBELL,STEPHEN, MD. (within the next few days)    Contact information:   [address], [place] [address] Phone: [phone]      Follow up with Nira Retort., MD. On 08/15/2012. (at 11am - they will call you to confirm this appointment)    Contact information:   [address] [place]  [address] [phone]       Follow up with [facility].   Contact information:   follow the instructions given to you by Dr. Mercy Moore      Schedule an appointment as soon as possible for a visit with Windy Kalata, MD.   Contact information:   [address] [place] [address] [phone]          Discharge Condition: stable   Chrisandra Netters, MD Family Medicine PGY-1

## 2012-08-07 NOTE — Progress Notes (Signed)
S: no new CO  Ready to go home O:BP 127/82  Pulse 66  Temp 97.8 F (36.6 C) (Oral)  Resp 18  Ht 4\' 11"  (1.499 m)  Wt 64.7 kg (142 lb 10.2 oz)  BMI 28.81 kg/m2  SpO2 100%  Intake/Output Summary (Last 24 hours) at 08/07/12 0945 Last data filed at 08/07/12 0540  Gross per 24 hour  Intake 1389.87 ml  Output    950 ml  Net 439.87 ml   Weight change:  IN:[id number] and alert CVS:RRR with [id number] systolic murmur Resp:clear Abd:+BS NTND Ext:Lt arm swelling about the same, less erythema.  Most the tenderness over the AVG NEURO:CNI OX3      . enoxaparin (LOVENOX) injection  1 mg/kg Subcutaneous Q12H  . magnesium oxide  200 mg Oral Daily  . mycophenolate  720 mg Oral BID  . predniSONE  5 mg Oral Q breakfast  . simvastatin  5 mg Oral Daily  . sodium chloride  3 mL Intravenous Q12H  . tacrolimus  4 mg Oral BID   No results found. BMET    Component Value Date/Time   NA 137 08/07/2012 0445   K 3.9 08/07/2012 0445   CL 106 08/07/2012 0445   CO2 19 08/07/2012 0445   GLUCOSE 72 08/07/2012 0445   BUN 24* 08/07/2012 0445   CREATININE 1.74* 08/07/2012 0445   CALCIUM 10.1 08/07/2012 0445   GFRNONAA 37* 08/07/2012 0445   GFRAA 43* 08/07/2012 0445   CBC    Component Value Date/Time   WBC 4.5 08/07/2012 0445   RBC 3.46* 08/07/2012 0445   HGB 9.6* 08/07/2012 0445   HCT 29.8* 08/07/2012 0445   PLT 307 08/07/2012 0445   MCV 86.1 08/07/2012 0445   MCH 27.7 08/07/2012 0445   MCHC 32.2 08/07/2012 0445   RDW 13.5 08/07/2012 0445   LYMPHSABS 1.1 08/02/2012 1558   MONOABS 0.4 08/02/2012 1558   EOSABS 0.0 08/02/2012 1558   BASOSABS 0.0 08/02/2012 1558     Assessment: 1. Lt Axillary/subclavian/AVF thrombosis 2. Acute on CKD3 in renal transplant, Scr trending down 3. Anemia on procrit.  SP feraheme infusion  Plan: 1. OK to go from my standpoint.  I have arranged FU renal profile and prograf level for next week in [facility] [facility]

## 2012-08-07 NOTE — Progress Notes (Signed)
Discharge instructions/Med Rec Sheet reviewed w/ pt. Pt expressed understanding and copies given w/ prescriptions. Pt d/c'd in stable condition via w/c, accompanied by discharge volunteers 

## 2012-08-08 ENCOUNTER — Encounter (HOSPITAL_COMMUNITY): Payer: Self-pay | Admitting: Family Medicine

## 2012-08-08 ENCOUNTER — Emergency Department (HOSPITAL_COMMUNITY)
Admission: EM | Admit: 2012-08-08 | Discharge: 2012-08-08 | Disposition: A | Discharge status: Home / Self Care | Payer: Medicare Other | Attending: Emergency Medicine | Admitting: Emergency Medicine

## 2012-08-08 ENCOUNTER — Telehealth: Payer: Self-pay | Admitting: Family Medicine

## 2012-08-08 DIAGNOSIS — E7800 Pure hypercholesterolemia, unspecified: Secondary | ICD-10-CM | POA: Insufficient documentation

## 2012-08-08 DIAGNOSIS — N63 Unspecified lump in unspecified breast: Secondary | ICD-10-CM | POA: Diagnosis not present

## 2012-08-08 DIAGNOSIS — R197 Diarrhea, unspecified: Secondary | ICD-10-CM | POA: Diagnosis not present

## 2012-08-08 DIAGNOSIS — N904 Leukoplakia of vulva: Secondary | ICD-10-CM | POA: Insufficient documentation

## 2012-08-08 DIAGNOSIS — N898 Other specified noninflammatory disorders of vagina: Secondary | ICD-10-CM | POA: Diagnosis not present

## 2012-08-08 DIAGNOSIS — Z8544 Personal history of malignant neoplasm of other female genital organs: Secondary | ICD-10-CM | POA: Insufficient documentation

## 2012-08-08 DIAGNOSIS — R112 Nausea with vomiting, unspecified: Secondary | ICD-10-CM | POA: Diagnosis not present

## 2012-08-08 DIAGNOSIS — I824Z9 Acute embolism and thrombosis of unspecified deep veins of unspecified distal lower extremity: Secondary | ICD-10-CM | POA: Diagnosis not present

## 2012-08-08 DIAGNOSIS — Z79899 Other long term (current) drug therapy: Secondary | ICD-10-CM | POA: Diagnosis not present

## 2012-08-08 DIAGNOSIS — N251 Nephrogenic diabetes insipidus: Secondary | ICD-10-CM | POA: Diagnosis not present

## 2012-08-08 DIAGNOSIS — Z940 Kidney transplant status: Secondary | ICD-10-CM | POA: Insufficient documentation

## 2012-08-08 DIAGNOSIS — N179 Acute kidney failure, unspecified: Secondary | ICD-10-CM | POA: Diagnosis not present

## 2012-08-08 DIAGNOSIS — D649 Anemia, unspecified: Secondary | ICD-10-CM | POA: Diagnosis not present

## 2012-08-08 LAB — CBC WITH DIFFERENTIAL/PLATELET
Basophils Relative: 0 % (ref 0–1)
Eosinophils Absolute: 0 10*3/uL (ref 0.0–0.7)
HCT: 26.8 % — ABNORMAL LOW (ref 36.0–46.0)
Hemoglobin: 8.8 g/dL — ABNORMAL LOW (ref 12.0–15.0)
Lymphs Abs: 1.2 10*3/uL (ref 0.7–4.0)
MCH: 28 pg (ref 26.0–34.0)
MCHC: 32.8 g/dL (ref 30.0–36.0)
Monocytes Absolute: 0.5 10*3/uL (ref 0.1–1.0)
Monocytes Relative: 10 % (ref 3–12)
Neutrophils Relative %: 64 % (ref 43–77)
RBC: 3.14 MIL/uL — ABNORMAL LOW (ref 3.87–5.11)

## 2012-08-08 LAB — CULTURE, BLOOD (ROUTINE X 2): Culture: NO GROWTH

## 2012-08-08 LAB — PROTIME-INR
INR: 2.71 — ABNORMAL HIGH (ref 0.00–1.49)
Prothrombin Time: 27.4 seconds — ABNORMAL HIGH (ref 11.6–15.2)

## 2012-08-08 LAB — COMPREHENSIVE METABOLIC PANEL
Alkaline Phosphatase: 82 U/L (ref 39–117)
BUN: 21 mg/dL (ref 6–23)
Chloride: 108 mEq/L (ref 96–112)
Creatinine, Ser: 1.63 mg/dL — ABNORMAL HIGH (ref 0.50–1.10)
GFR calc Af Amer: 46 mL/min — ABNORMAL LOW (ref >90)
Glucose, Bld: 110 mg/dL — ABNORMAL HIGH (ref 70–99)
Potassium: 3.7 mEq/L (ref 3.5–5.1)
Total Bilirubin: 0.2 mg/dL — ABNORMAL LOW (ref 0.3–1.2)

## 2012-08-08 LAB — URINALYSIS, ROUTINE W REFLEX MICROSCOPIC
Bilirubin Urine: NEGATIVE
Ketones, ur: NEGATIVE mg/dL
Nitrite: NEGATIVE
Protein, ur: 30 mg/dL — AB
Urobilinogen, UA: 0.2 mg/dL (ref 0.0–1.0)

## 2012-08-08 LAB — URINE MICROSCOPIC-ADD ON

## 2012-08-08 MED ORDER — HYDROCODONE-ACETAMINOPHEN 5-325 MG PO TABS: 1.0000 | ORAL_TABLET | Freq: Once | ORAL | Status: DC

## 2012-08-08 MED ORDER — HYDROCODONE-ACETAMINOPHEN 5-325 MG PO TABS: ORAL_TABLET | ORAL | Status: DC

## 2012-08-08 MED ORDER — ONDANSETRON 4 MG PO TBDP
8.0000 mg | ORAL_TABLET | Freq: Once | ORAL | Status: AC
  Administered 2012-08-08: 8 mg via ORAL
  Filled 2012-08-08: qty 2

## 2012-08-08 NOTE — ED Provider Notes (Signed)
History     CSN: [id number]  Arrival date & time 08/08/12  1028   First MD Initiated Contact with Patient 08/08/12 1145      Chief Complaint  Patient presents with  . Abdominal Pain    (Consider location/radiation/quality/duration/timing/severity/associated sxs/prior treatment) HPI Comments: Patients with history of end-stage renal disease, renal transplant, left upper extremity blood clot, discharge from the hospital yesterday on Xarelto -- presents today with complaint of nausea, vomiting, and diarrhea. This began approximately 20 minutes after taking her Xarelto today. Patient vomited once. She states that her nausea is improving but not gone. She had an episode of watery diarrhea. She went to her kidney doctor office and was told to go to the emergency department. Patient denies fever, cold symptoms, shortness of breath, chest pain, urinary symptoms, swelling in her legs. Patient states that her menstrual period began today as well. In addition, she complains of bilateral calf pain without swelling of her lower extremities. Onset was acute. Course is gradually improving. Nothing makes symptoms better or worse.  The history is provided by the patient and medical records.    Past Medical History  Diagnosis Date  . Hypercholesterolemia   . 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 Vulvar dystrophy   . History of renal transplant   . Nephrogenic diabetes insipidus     congenital  . Renal failure, chronic     Dialysis in the past  . 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 Kidney transplant 1995, 2008  . Tubal ligation   . Av fistula placement   . Simple vulvectomy 06/19/2011    Partial simple left  . Parathyroidectomy 2004    Portion on the parathyroid gland transplanted in R forearm    Family History  Problem Relation Age of Onset  . Hypertension Mother     History  Substance Use Topics  . Smoking  status: Never Smoker   . Smokeless tobacco: Not on file  . Alcohol Use: No    OB History    Grav Para Term Preterm Abortions TAB SAB Ect Mult Living                  Review of Systems  Constitutional: Negative for fever.  HENT: Negative for sore throat and rhinorrhea.   Eyes: Negative for redness.  Respiratory: Negative for cough.   Cardiovascular: Negative for chest pain and leg swelling.  Gastrointestinal: Positive for nausea, vomiting and diarrhea. Negative for abdominal pain and blood in stool.  Genitourinary: Positive for vaginal bleeding. Negative for dysuria.  Musculoskeletal: Negative for myalgias.  Skin: Negative for rash.  Neurological: Negative for headaches.    Allergies  Oxycodone; Adhesive; Bactrim; Imuran; and Warfarin sodium  Home Medications   Current Outpatient Rx  Name  Route  Sig  Dispense  Refill  . MAGNESIUM 200 MG PO TABS   Oral   Take 1 tablet by mouth daily.           [name] MYCOPHENOLATE SODIUM 360 MG PO TBEC   Oral   Take 360 mg by mouth 2 (two) times daily.          [name] PREDNISONE 5 MG PO TABS   Oral   Take 5 mg by mouth daily.           [name] RIVAROXABAN 15 MG PO TABS   Oral   Take 1  tablet (15 mg total) by mouth 2 (two) times daily with a meal. For 3 weeks. Then take 20mg  PO QD with food (separate rx for 20mg ).   41 tablet   0   . RIVAROXABAN 20 MG PO TABS   Oral   Take 1 tablet (20 mg total) by mouth daily. Take with food. Start on 08/28/12. Will need further refills from primary doctor.   30 tablet   0   . SIMVASTATIN 10 MG PO TABS   Oral   Take 0.5 tablets (5 mg total) by mouth daily.         [name] TACROLIMUS 1 MG PO CAPS   Oral   Take 4 mg by mouth 2 (two) times daily.             BP 132/83  Pulse 76  Temp 97.8 F (36.6 C)  Resp 18  SpO2 100%  LMP 08/08/2012  Physical Exam  Nursing note and vitals reviewed. Constitutional: She appears well-developed and well-nourished.  HENT:  Head: Normocephalic and atraumatic.   Mouth/Throat: Oropharynx is clear and moist. No oropharyngeal exudate.  Eyes: Conjunctivae normal are normal. Right eye exhibits no discharge. Left eye exhibits no discharge.  Neck: Normal range of motion. Neck supple.  Cardiovascular: Normal rate, regular rhythm and normal heart sounds.   No murmur heard. Pulmonary/Chest: Effort normal and breath sounds normal. No respiratory distress. She has no wheezes. She has no rales.  Abdominal: Soft. Bowel sounds are normal. She exhibits no distension. There is no tenderness. There is no rebound and no guarding.       No pain over transplanted kidney  Musculoskeletal: She exhibits no tenderness.       No lower extremity edema. Generalized mild tenderness with palpation of calves.  Neurological: She is alert.  Skin: Skin is warm and dry.  Psychiatric: She has a normal mood and affect.    ED Course  Procedures (including critical care time)  Labs Reviewed  CBC WITH DIFFERENTIAL - Abnormal; Notable for the following:    RBC 3.14 (*)     Hemoglobin 8.8 (*)     HCT 26.8 (*)     All other components within normal limits  COMPREHENSIVE METABOLIC PANEL - Abnormal; Notable for the following:    Glucose, Bld 110 (*)     Creatinine, Ser 1.63 (*)     Albumin 3.2 (*)     AST 96 (*)     ALT 75 (*)     Total Bilirubin 0.2 (*)     GFR calc non Af Amer 40 (*)     GFR calc Af Amer 46 (*)     All other components within normal limits  PROTIME-INR - Abnormal; Notable for the following:    Prothrombin Time 27.4 (*)     INR 2.71 (*)     All other components within normal limits  URINALYSIS, ROUTINE W REFLEX MICROSCOPIC - Abnormal; Notable for the following:    Hgb urine dipstick LARGE (*)     Protein, ur 30 (*)     All other components within normal limits  URINE MICROSCOPIC-ADD ON - Abnormal; Notable for the following:    Squamous Epithelial / LPF FEW (*)     Casts HYALINE CASTS (*)     All other components within normal limits  LIPASE, BLOOD    No results found.   1. Nausea vomiting and diarrhea     12:18 PM Patient seen and examined. Previous hospitalization reviewed.  Work-up initiated. Medications ordered.   Vital signs reviewed and are as follows: Filed Vitals:   08/08/12 1034  BP: 132/83  Pulse: 76  Temp: 97.8 F (36.6 C)  Resp: 18   4:18 PM Patient was d/w Dr. Karle Starch. Symptoms were improved in ED. Tolerating PO's.   Labs at baseline.   Pt informed of results.   Will d/c home with supportive therapy. Patient appears well. She states part of symptoms might be due to period starting. States her periods have been worse since transplant.   Patient urged to return with worsening symptoms or other concerns. Patient verbalized understanding and agrees with plan.   MDM  Patient with N/V/D after recent admission for upper extremity DVT. Symptoms seemed to be related to Xarelto today, however, she took it last night without problem. She also started her period today. Labs are at baseline. Other consideration includes gastroenteritis. Patient appears well and symptoms are improved. No indication for admission. Supportive care given. Urged PCP follow-up.        Carlisle Cater, [place] 08/08/12 1622

## 2012-08-08 NOTE — ED Notes (Signed)
Per pt sts abdominal pain since she took her blood thinner this am. sts also having knee and ankle pain. Dx blood clot in shoulder recently. Recently discharged from the hospital. sts N,V,D.

## 2012-08-08 NOTE — Telephone Encounter (Signed)
Called patient to check on how she's doing and make sure she got her Xarelto yesterday, as I realized last night that she did not receive her first dose before leaving the hospital. She was able to get it from her pharmacy, and took the first dose at around 8pm last night.  She is currently at the [facility], where she walked in because she was not feeling well. She woke up this morning with pain in her calf muscles. Her arm is also sore, where she had the DVT. She is feeling nauseated and like she has to have bowel movements frequently. She is wondering if this is a side effect of the Xarelto. [facility] Dr. Mercy Moore and they are waiting to hear back from him. I happened to call and check on her during the middle of all of this.  She took the Xarelto again this morning around 9am with her breakfast, but she wasn't able to eat all of it. She is tearful on the phone now.  I told her that she should be seen by a doctor today, since she's not feeling well, and stressed the importance of being on anticoagulation. If she is unable to tolerate the Xarelto, we will need to give her something else (possibly Lovenox). Encouraged her to either be seen by a physician at the [facility], urgent care, or the emergency room.  I will try to get in touch with Dr. Mercy Moore myself so that he knows we've talked. Will also discuss with Dr. Lindell Noe this morning, who is our attending on FPTS and followed her course in the hospital.  Chrisandra Netters, MD [facility] Medicine PGY-1 Pager [phone]

## 2012-08-08 NOTE — ED Notes (Signed)
Kidney transplant pt.  Started xerelco last night. Sent by MD.

## 2012-08-09 NOTE — ED Provider Notes (Signed)
Medical screening examination/treatment/procedure(s) were performed by non-physician practitioner and as supervising physician I was immediately available for consultation/collaboration.   Charles B. Karle Starch, MD 08/09/12 [phone]

## 2012-08-12 NOTE — ED Provider Notes (Signed)
Medical screening examination/treatment/procedure(s) were conducted as a shared visit with non-physician practitioner(s) and myself.  I personally evaluated the patient during the encounter 36 yo woman with pain in left arm, which is the site of an unused dialysis graft.  Exam shows swelling and tenderness in the left upper arm.  Ultrasound revealed DVT in the left upper arm and left internal jugular vein.  Admitted for Rx of DVT with anticoagulant meds.  Mylinda Latina III, MD 08/12/12 [phone]

## 2012-08-14 DIAGNOSIS — I82409 Acute embolism and thrombosis of unspecified deep veins of unspecified lower extremity: Secondary | ICD-10-CM | POA: Diagnosis not present

## 2012-08-20 DIAGNOSIS — N926 Irregular menstruation, unspecified: Secondary | ICD-10-CM | POA: Diagnosis not present

## 2012-08-20 DIAGNOSIS — N938 Other specified abnormal uterine and vaginal bleeding: Secondary | ICD-10-CM | POA: Diagnosis not present

## 2012-08-20 DIAGNOSIS — N925 Other specified irregular menstruation: Secondary | ICD-10-CM | POA: Diagnosis not present

## 2012-08-20 DIAGNOSIS — Z2089 Contact with and (suspected) exposure to other communicable diseases: Secondary | ICD-10-CM | POA: Diagnosis not present

## 2012-08-20 DIAGNOSIS — Z79899 Other long term (current) drug therapy: Secondary | ICD-10-CM | POA: Diagnosis not present

## 2012-08-20 DIAGNOSIS — N949 Unspecified condition associated with female genital organs and menstrual cycle: Secondary | ICD-10-CM | POA: Diagnosis not present

## 2012-08-20 DIAGNOSIS — D071 Carcinoma in situ of vulva: Secondary | ICD-10-CM | POA: Diagnosis not present

## 2012-08-20 DIAGNOSIS — Z940 Kidney transplant status: Secondary | ICD-10-CM | POA: Diagnosis not present

## 2012-08-21 DIAGNOSIS — N949 Unspecified condition associated with female genital organs and menstrual cycle: Secondary | ICD-10-CM | POA: Diagnosis not present

## 2012-08-25 DIAGNOSIS — Z940 Kidney transplant status: Secondary | ICD-10-CM | POA: Diagnosis not present

## 2012-08-26 DIAGNOSIS — D689 Coagulation defect, unspecified: Secondary | ICD-10-CM | POA: Diagnosis not present

## 2012-08-26 DIAGNOSIS — R109 Unspecified abdominal pain: Secondary | ICD-10-CM | POA: Diagnosis not present

## 2012-08-26 DIAGNOSIS — R110 Nausea: Secondary | ICD-10-CM | POA: Diagnosis not present

## 2012-08-28 DIAGNOSIS — D509 Iron deficiency anemia, unspecified: Secondary | ICD-10-CM | POA: Diagnosis not present

## 2012-08-28 DIAGNOSIS — D638 Anemia in other chronic diseases classified elsewhere: Secondary | ICD-10-CM | POA: Diagnosis not present

## 2012-08-28 DIAGNOSIS — N183 Chronic kidney disease, stage 3 unspecified: Secondary | ICD-10-CM | POA: Diagnosis not present

## 2012-08-29 DIAGNOSIS — N920 Excessive and frequent menstruation with regular cycle: Secondary | ICD-10-CM | POA: Diagnosis not present

## 2012-08-29 DIAGNOSIS — Z6828 Body mass index (BMI) 28.0-28.9, adult: Secondary | ICD-10-CM | POA: Diagnosis not present

## 2012-08-29 DIAGNOSIS — I82629 Acute embolism and thrombosis of deep veins of unspecified upper extremity: Secondary | ICD-10-CM | POA: Diagnosis not present

## 2012-09-01 ENCOUNTER — Encounter: Payer: Self-pay | Admitting: Vascular Surgery

## 2012-09-02 ENCOUNTER — Encounter: Payer: Self-pay | Admitting: Vascular Surgery

## 2012-09-02 ENCOUNTER — Ambulatory Visit (INDEPENDENT_AMBULATORY_CARE_PROVIDER_SITE_OTHER): Payer: Medicare Other | Admitting: Vascular Surgery

## 2012-09-02 VITALS — BP 122/73 | HR 74 | Resp 16 | Ht 59.0 in | Wt 142.0 lb

## 2012-09-02 DIAGNOSIS — N186 End stage renal disease: Secondary | ICD-10-CM

## 2012-09-02 DIAGNOSIS — T82598A Other mechanical complication of other cardiac and vascular devices and implants, initial encounter: Secondary | ICD-10-CM

## 2012-09-02 DIAGNOSIS — T829XXA Unspecified complication of cardiac and vascular prosthetic device, implant and graft, initial encounter: Secondary | ICD-10-CM

## 2012-09-02 HISTORY — DX: Other mechanical complication of other cardiac and vascular devices and implants, initial encounter: T82.598A

## 2012-09-02 HISTORY — DX: Unspecified complication of cardiac and vascular prosthetic device, implant and graft, initial encounter: T82.9XXA

## 2012-09-02 HISTORY — DX: End stage renal disease: N18.6

## 2012-09-02 NOTE — Progress Notes (Signed)
The patient presents today for evaluation of left arm AV graft occlusion. She has a long history of renal failure and had a successful cadaveric kidney transplant in April 2008. She has been on hemodialysis since that time. She reports that her graft had remained patent for the 5 years. She recently had a episode of axillary subclavian vein occlusion and was admitted to the hospital for this and is being appropriately treated with anticoagulation. Her graft occluded during this time. She does have a bruising around the graft and is seen today for evaluation of this. She reports no evidence of inflammation and no fevers.  Past Medical History  Diagnosis Date  . Hypercholesterolemia   . 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 Vulvar dystrophy   . History of renal transplant   . Nephrogenic diabetes insipidus     congenital  . Renal failure, chronic     Dialysis in the past  . 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 Anemia   . Cancer     pre cervical    History  Substance Use Topics  . Smoking status: Never Smoker   . Smokeless tobacco: Never Used  . Alcohol Use: No    Family History  Problem Relation Age of Onset  . Hypertension Mother   . Hyperlipidemia Mother     Allergies  Allergen Reactions  . Oxycodone Nausea And Vomiting  . Adhesive (Tape) Rash  . Bactrim Swelling and Rash  . Imuran (Azathioprine Sodium) Rash  . Warfarin Sodium Rash    Current outpatient prescriptions:Epoetin Alfa (PROCRIT IJ), Inject as directed every 21 ( twenty-one) days., Disp: , Rfl: ;  Magnesium 200 MG TABS, Take 1 tablet by mouth daily.  , Disp: , Rfl: ;  mycophenolate (MYFORTIC) 360 MG TBEC, Take 360 mg by mouth 2 (two) times daily. , Disp: , Rfl: ;  predniSONE (DELTASONE) 5 MG tablet, Take 5 mg by mouth daily.  , Disp: , Rfl:  simvastatin (ZOCOR) 10 MG tablet, Take 0.5 tablets (5 mg total) by mouth daily., Disp: , Rfl: ;  tacrolimus (PROGRAF) 1 MG capsule, Take 4 mg  by mouth 2 (two) times daily.  , Disp: , Rfl: ;  HYDROcodone-acetaminophen (NORCO/VICODIN) 5-325 MG per tablet, Take 1-2 tablets every 6 hours as needed for severe pain, Disp: 8 tablet, Rfl: 0 Rivaroxaban (XARELTO) 15 MG TABS tablet, Take 1 tablet (15 mg total) by mouth 2 (two) times daily with a meal. For 3 weeks. Then take 20mg  PO QD with food (separate rx for 20mg )., Disp: 41 tablet, Rfl: 0;  Rivaroxaban (XARELTO) 20 MG TABS, Take 1 tablet (20 mg total) by mouth daily. Take with food. Start on 08/28/12. Will need further refills from primary doctor., Disp: 30 tablet, Rfl: 0  BP 122/73  Pulse 74  Resp 16  Ht 4\' 11"  (1.499 m)  Wt 142 lb (64.411 kg)  BMI 28.68 kg/m2  SpO2 100%  LMP 08/08/2012  Body mass index is 28.68 kg/(m^2).       Review of systems is negative except for history of present illness   Physical exam: Well-developed well-nourished female in no acute distress Pulse status 2+ radial pulses bilaterally Neurologically she is grossly intact She does have an occluded left forearm loop graft with no evidence of fluctuance. There is some staining around the graft. 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 I explained there is no downside of having this graft in place and occluded. This is quite common and rarely causes difficulty. I suspect that she did have manifestations of thrombophlebitis around the graft after it occluded following her axillary subclavian DVT. She was reassured this discussion will see [place] again on an as-needed basis

## 2012-09-05 DIAGNOSIS — N949 Unspecified condition associated with female genital organs and menstrual cycle: Secondary | ICD-10-CM | POA: Diagnosis not present

## 2012-09-10 DIAGNOSIS — Z2089 Contact with and (suspected) exposure to other communicable diseases: Secondary | ICD-10-CM | POA: Diagnosis not present

## 2012-09-10 DIAGNOSIS — N926 Irregular menstruation, unspecified: Secondary | ICD-10-CM | POA: Diagnosis not present

## 2012-09-10 DIAGNOSIS — N939 Abnormal uterine and vaginal bleeding, unspecified: Secondary | ICD-10-CM | POA: Diagnosis not present

## 2012-09-15 DIAGNOSIS — N920 Excessive and frequent menstruation with regular cycle: Secondary | ICD-10-CM | POA: Diagnosis not present

## 2012-09-15 DIAGNOSIS — F411 Generalized anxiety disorder: Secondary | ICD-10-CM | POA: Diagnosis not present

## 2012-09-15 DIAGNOSIS — Z6827 Body mass index (BMI) 27.0-27.9, adult: Secondary | ICD-10-CM | POA: Diagnosis not present

## 2012-09-15 DIAGNOSIS — E7800 Pure hypercholesterolemia, unspecified: Secondary | ICD-10-CM | POA: Diagnosis not present

## 2012-09-16 DIAGNOSIS — I82629 Acute embolism and thrombosis of deep veins of unspecified upper extremity: Secondary | ICD-10-CM | POA: Diagnosis not present

## 2012-09-29 DIAGNOSIS — N904 Leukoplakia of vulva: Secondary | ICD-10-CM | POA: Diagnosis not present

## 2012-09-29 DIAGNOSIS — N926 Irregular menstruation, unspecified: Secondary | ICD-10-CM | POA: Diagnosis not present

## 2012-10-01 DIAGNOSIS — N904 Leukoplakia of vulva: Secondary | ICD-10-CM | POA: Diagnosis not present

## 2012-10-02 DIAGNOSIS — N904 Leukoplakia of vulva: Secondary | ICD-10-CM | POA: Diagnosis not present

## 2012-10-09 DIAGNOSIS — E785 Hyperlipidemia, unspecified: Secondary | ICD-10-CM | POA: Diagnosis not present

## 2012-10-09 DIAGNOSIS — D649 Anemia, unspecified: Secondary | ICD-10-CM | POA: Diagnosis not present

## 2012-10-09 DIAGNOSIS — Z940 Kidney transplant status: Secondary | ICD-10-CM | POA: Diagnosis not present

## 2012-10-09 DIAGNOSIS — Z79899 Other long term (current) drug therapy: Secondary | ICD-10-CM | POA: Diagnosis not present

## 2012-10-14 DIAGNOSIS — N183 Chronic kidney disease, stage 3 unspecified: Secondary | ICD-10-CM | POA: Diagnosis not present

## 2012-10-14 DIAGNOSIS — D638 Anemia in other chronic diseases classified elsewhere: Secondary | ICD-10-CM | POA: Diagnosis not present

## 2012-10-14 DIAGNOSIS — D509 Iron deficiency anemia, unspecified: Secondary | ICD-10-CM | POA: Diagnosis not present

## 2012-10-15 DIAGNOSIS — D071 Carcinoma in situ of vulva: Secondary | ICD-10-CM | POA: Diagnosis not present

## 2012-10-15 DIAGNOSIS — N904 Leukoplakia of vulva: Secondary | ICD-10-CM | POA: Diagnosis not present

## 2012-10-15 DIAGNOSIS — N949 Unspecified condition associated with female genital organs and menstrual cycle: Secondary | ICD-10-CM | POA: Diagnosis not present

## 2012-11-06 DIAGNOSIS — N920 Excessive and frequent menstruation with regular cycle: Secondary | ICD-10-CM | POA: Diagnosis not present

## 2012-11-06 DIAGNOSIS — I82629 Acute embolism and thrombosis of deep veins of unspecified upper extremity: Secondary | ICD-10-CM | POA: Diagnosis not present

## 2012-11-06 DIAGNOSIS — Z6829 Body mass index (BMI) 29.0-29.9, adult: Secondary | ICD-10-CM | POA: Diagnosis not present

## 2012-11-06 DIAGNOSIS — J0100 Acute maxillary sinusitis, unspecified: Secondary | ICD-10-CM | POA: Diagnosis not present

## 2012-11-10 DIAGNOSIS — Z79899 Other long term (current) drug therapy: Secondary | ICD-10-CM | POA: Diagnosis not present

## 2012-11-10 DIAGNOSIS — D649 Anemia, unspecified: Secondary | ICD-10-CM | POA: Diagnosis not present

## 2012-11-10 DIAGNOSIS — Z940 Kidney transplant status: Secondary | ICD-10-CM | POA: Diagnosis not present

## 2012-11-10 DIAGNOSIS — E785 Hyperlipidemia, unspecified: Secondary | ICD-10-CM | POA: Diagnosis not present

## 2012-11-20 DIAGNOSIS — Z2089 Contact with and (suspected) exposure to other communicable diseases: Secondary | ICD-10-CM | POA: Diagnosis not present

## 2012-11-21 DIAGNOSIS — N183 Chronic kidney disease, stage 3 unspecified: Secondary | ICD-10-CM | POA: Diagnosis not present

## 2012-11-21 DIAGNOSIS — D638 Anemia in other chronic diseases classified elsewhere: Secondary | ICD-10-CM | POA: Diagnosis not present

## 2012-11-27 DIAGNOSIS — Z940 Kidney transplant status: Secondary | ICD-10-CM | POA: Diagnosis not present

## 2012-12-10 DIAGNOSIS — Z940 Kidney transplant status: Secondary | ICD-10-CM | POA: Diagnosis not present

## 2012-12-25 DIAGNOSIS — I82629 Acute embolism and thrombosis of deep veins of unspecified upper extremity: Secondary | ICD-10-CM | POA: Diagnosis not present

## 2012-12-25 DIAGNOSIS — N183 Chronic kidney disease, stage 3 unspecified: Secondary | ICD-10-CM | POA: Diagnosis not present

## 2012-12-25 DIAGNOSIS — D638 Anemia in other chronic diseases classified elsewhere: Secondary | ICD-10-CM | POA: Diagnosis not present

## 2012-12-25 DIAGNOSIS — E7800 Pure hypercholesterolemia, unspecified: Secondary | ICD-10-CM | POA: Diagnosis not present

## 2012-12-25 DIAGNOSIS — M79609 Pain in unspecified limb: Secondary | ICD-10-CM | POA: Diagnosis not present

## 2012-12-25 DIAGNOSIS — Z6828 Body mass index (BMI) 28.0-28.9, adult: Secondary | ICD-10-CM | POA: Diagnosis not present

## 2013-01-09 DIAGNOSIS — N183 Chronic kidney disease, stage 3 unspecified: Secondary | ICD-10-CM | POA: Diagnosis not present

## 2013-01-09 DIAGNOSIS — Z79899 Other long term (current) drug therapy: Secondary | ICD-10-CM | POA: Diagnosis not present

## 2013-01-09 DIAGNOSIS — D638 Anemia in other chronic diseases classified elsewhere: Secondary | ICD-10-CM | POA: Diagnosis not present

## 2013-01-12 DIAGNOSIS — Z940 Kidney transplant status: Secondary | ICD-10-CM | POA: Diagnosis not present

## 2013-01-12 DIAGNOSIS — Z79899 Other long term (current) drug therapy: Secondary | ICD-10-CM | POA: Diagnosis not present

## 2013-01-12 DIAGNOSIS — E785 Hyperlipidemia, unspecified: Secondary | ICD-10-CM | POA: Diagnosis not present

## 2013-01-12 DIAGNOSIS — D649 Anemia, unspecified: Secondary | ICD-10-CM | POA: Diagnosis not present

## 2013-02-06 DIAGNOSIS — D649 Anemia, unspecified: Secondary | ICD-10-CM | POA: Diagnosis not present

## 2013-02-06 DIAGNOSIS — E785 Hyperlipidemia, unspecified: Secondary | ICD-10-CM | POA: Diagnosis not present

## 2013-02-06 DIAGNOSIS — Z79899 Other long term (current) drug therapy: Secondary | ICD-10-CM | POA: Diagnosis not present

## 2013-02-06 DIAGNOSIS — Z940 Kidney transplant status: Secondary | ICD-10-CM | POA: Diagnosis not present

## 2013-02-09 DIAGNOSIS — Z940 Kidney transplant status: Secondary | ICD-10-CM | POA: Diagnosis not present

## 2013-02-09 DIAGNOSIS — Z79899 Other long term (current) drug therapy: Secondary | ICD-10-CM | POA: Diagnosis not present

## 2013-02-11 DIAGNOSIS — H6120 Impacted cerumen, unspecified ear: Secondary | ICD-10-CM | POA: Diagnosis not present

## 2013-02-11 DIAGNOSIS — N920 Excessive and frequent menstruation with regular cycle: Secondary | ICD-10-CM | POA: Diagnosis not present

## 2013-02-11 DIAGNOSIS — I82629 Acute embolism and thrombosis of deep veins of unspecified upper extremity: Secondary | ICD-10-CM | POA: Diagnosis not present

## 2013-02-11 DIAGNOSIS — Z6828 Body mass index (BMI) 28.0-28.9, adult: Secondary | ICD-10-CM | POA: Diagnosis not present

## 2013-02-11 DIAGNOSIS — E7800 Pure hypercholesterolemia, unspecified: Secondary | ICD-10-CM | POA: Diagnosis not present

## 2013-03-04 DIAGNOSIS — N183 Chronic kidney disease, stage 3 unspecified: Secondary | ICD-10-CM | POA: Diagnosis not present

## 2013-03-04 DIAGNOSIS — D638 Anemia in other chronic diseases classified elsewhere: Secondary | ICD-10-CM | POA: Diagnosis not present

## 2013-03-05 DIAGNOSIS — N949 Unspecified condition associated with female genital organs and menstrual cycle: Secondary | ICD-10-CM | POA: Diagnosis not present

## 2013-03-05 DIAGNOSIS — D071 Carcinoma in situ of vulva: Secondary | ICD-10-CM | POA: Diagnosis not present

## 2013-03-05 DIAGNOSIS — N83209 Unspecified ovarian cyst, unspecified side: Secondary | ICD-10-CM | POA: Diagnosis not present

## 2013-03-21 DIAGNOSIS — Z940 Kidney transplant status: Secondary | ICD-10-CM | POA: Diagnosis not present

## 2013-03-21 DIAGNOSIS — Z79899 Other long term (current) drug therapy: Secondary | ICD-10-CM | POA: Diagnosis not present

## 2013-03-21 DIAGNOSIS — E860 Dehydration: Secondary | ICD-10-CM | POA: Diagnosis not present

## 2013-03-21 DIAGNOSIS — R112 Nausea with vomiting, unspecified: Secondary | ICD-10-CM | POA: Diagnosis not present

## 2013-03-21 DIAGNOSIS — N189 Chronic kidney disease, unspecified: Secondary | ICD-10-CM | POA: Diagnosis not present

## 2013-04-01 DIAGNOSIS — F411 Generalized anxiety disorder: Secondary | ICD-10-CM | POA: Diagnosis not present

## 2013-04-01 DIAGNOSIS — Z6827 Body mass index (BMI) 27.0-27.9, adult: Secondary | ICD-10-CM | POA: Diagnosis not present

## 2013-04-01 DIAGNOSIS — E7800 Pure hypercholesterolemia, unspecified: Secondary | ICD-10-CM | POA: Diagnosis not present

## 2013-04-01 DIAGNOSIS — I82629 Acute embolism and thrombosis of deep veins of unspecified upper extremity: Secondary | ICD-10-CM | POA: Diagnosis not present

## 2013-04-02 DIAGNOSIS — N83209 Unspecified ovarian cyst, unspecified side: Secondary | ICD-10-CM | POA: Diagnosis not present

## 2013-04-06 DIAGNOSIS — H538 Other visual disturbances: Secondary | ICD-10-CM | POA: Diagnosis not present

## 2013-04-08 DIAGNOSIS — D071 Carcinoma in situ of vulva: Secondary | ICD-10-CM | POA: Diagnosis not present

## 2013-04-08 DIAGNOSIS — N926 Irregular menstruation, unspecified: Secondary | ICD-10-CM | POA: Diagnosis not present

## 2013-04-08 DIAGNOSIS — N83209 Unspecified ovarian cyst, unspecified side: Secondary | ICD-10-CM | POA: Diagnosis not present

## 2013-04-08 DIAGNOSIS — N939 Abnormal uterine and vaginal bleeding, unspecified: Secondary | ICD-10-CM | POA: Diagnosis not present

## 2013-04-09 DIAGNOSIS — N901 Moderate vulvar dysplasia: Secondary | ICD-10-CM | POA: Diagnosis not present

## 2013-04-09 DIAGNOSIS — D071 Carcinoma in situ of vulva: Secondary | ICD-10-CM | POA: Diagnosis not present

## 2013-04-24 DIAGNOSIS — D638 Anemia in other chronic diseases classified elsewhere: Secondary | ICD-10-CM | POA: Diagnosis not present

## 2013-04-24 DIAGNOSIS — N183 Chronic kidney disease, stage 3 unspecified: Secondary | ICD-10-CM | POA: Diagnosis not present

## 2013-05-01 ENCOUNTER — Encounter: Payer: Self-pay | Admitting: Gynecology

## 2013-05-01 ENCOUNTER — Ambulatory Visit: Payer: Medicare Other | Attending: Gynecology | Admitting: Gynecology

## 2013-05-01 VITALS — BP 132/86 | HR 70 | Temp 98.9°F | Resp 16 | Ht 59.0 in | Wt 137.6 lb

## 2013-05-01 DIAGNOSIS — N901 Moderate vulvar dysplasia: Secondary | ICD-10-CM | POA: Insufficient documentation

## 2013-05-01 DIAGNOSIS — K649 Unspecified hemorrhoids: Secondary | ICD-10-CM | POA: Diagnosis not present

## 2013-05-01 DIAGNOSIS — D0730 Carcinoma in situ of unspecified female genital organs: Secondary | ICD-10-CM | POA: Diagnosis not present

## 2013-05-01 DIAGNOSIS — A630 Anogenital (venereal) warts: Secondary | ICD-10-CM | POA: Insufficient documentation

## 2013-05-01 DIAGNOSIS — E7800 Pure hypercholesterolemia, unspecified: Secondary | ICD-10-CM | POA: Diagnosis not present

## 2013-05-01 DIAGNOSIS — N893 Dysplasia of vagina, unspecified: Secondary | ICD-10-CM

## 2013-05-01 DIAGNOSIS — Z79899 Other long term (current) drug therapy: Secondary | ICD-10-CM | POA: Insufficient documentation

## 2013-05-01 DIAGNOSIS — Z940 Kidney transplant status: Secondary | ICD-10-CM | POA: Diagnosis not present

## 2013-05-01 NOTE — Patient Instructions (Addendum)
Plan to follow up in six months or sooner if issues arise.  Please call in Nov. Or Dec. 2014 to schedule an appointment for Feb. 2015 with Dr. Fermin Schwab.

## 2013-05-01 NOTE — Progress Notes (Signed)
Consult Note: Gyn-Onc   Ariel [name] 37 y.o. female  Chief Complaint  Patient presents with  . VAIN    Reconsult    Assessment:  Recent vulvar biopsy showing VIN 2. I am unable to identify the lesion today.  Plan we'll have the patient return in 6 months for reexamination. She will contact us if she develops any new symptoms especially of pruritus or an obvious lesion.  Interval History: Patient returns today for further evaluation. She recently had a biopsy of the left vulva showing VIN 2. She reports she has not been having any vulvar symptoms. She did complete a course about para which had been initiated at our last visit.  HPI:A 37 year old white female seen in  consultation requested by Dr. Marin Roberts regarding newly diagnosed severe  dysplasia of the vulva.  The patient has had a past chronic history of condylomata, but recently  was found to have an asymptomatic lesion on the vulva, which Dr.  Marin Roberts biopsied. The patient specifically denies any pruritus. She  has recently been using Aldara for treatment of vulvar and perianal  condylomata and has been tolerating that well. 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  Margins were negative  Review of systems: 10 point review of systems negative except as noted above. [name] Physical exam: In general is a healthy white female no acute distress  HEENT is negative  Neck supple without thyromegaly.  There is no supra-clavicular or inguinal adenopathy  The abdomen is soft nontender no masses organomegaly ascites or hernias are noted.  Pelvic exam   EGBUS reveals no lesions. I applied acetic acid 4 or 2 minutes and reexamined the patient and still was unable to see any definite lesions. I could definitely see the healing biopsy site on the left labia majora. There are no lesions that appear to be carcinoma in situ. There are no condylomata present. She does have a relatively large  hemorrhoids.   Allergies  Allergen Reactions  . Oxycodone Nausea And Vomiting  . Adhesive [Tape] Rash  . Bactrim Swelling and Rash  . Imuran [Azathioprine Sodium] Rash  . Warfarin Sodium Rash    Past Medical History  Diagnosis Date  . Hypercholesterolemia   . 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 Vulvar dystrophy   . History of renal transplant   . Nephrogenic diabetes insipidus     congenital  . Renal failure, chronic     Dialysis in the past  . 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 Anemia   . Cancer     pre cervical    Past Surgical History  Procedure Laterality Date  . Kidney transplant  1995, 2008  . Tubal ligation    . Av fistula placement    . Simple vulvectomy  06/19/2011    Partial simple left  . 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 Insertion of dialysis catheter    . Removal of a dialysis catheter      Current Outpatient Prescriptions  Medication Sig Dispense Refill  . Epoetin Alfa (PROCRIT IJ) Inject as directed every 21 ( twenty-one) days.      . Magnesium 200 MG TABS Take 1 tablet by mouth daily.        . mycophenolate (MYFORTIC) 360 MG TBEC Take 360 mg by mouth 2 (two) times daily.       [name]  predniSONE (DELTASONE) 5 MG tablet Take 5 mg by mouth daily.        . simvastatin (ZOCOR) 10 MG tablet Take 0.5 tablets (5 mg total) by mouth daily.      . tacrolimus (PROGRAF) 1 MG capsule Take 4 mg by mouth 2 (two) times daily.        [name] HYDROcodone-acetaminophen (NORCO/VICODIN) 5-325 MG per tablet Take 1-2 tablets every 6 hours as needed for severe pain  8 tablet  0  . Rivaroxaban (XARELTO) 15 MG TABS tablet Take 1 tablet (15 mg total) by mouth 2 (two) times daily with a meal. For 3 weeks. Then take 20mg  PO QD with food (separate rx for 20mg ).  41 tablet  0  . Rivaroxaban (XARELTO) 20 MG TABS Take 1 tablet (20 mg total) by mouth daily. Take with food. Start on 08/28/12. Will need  further refills from primary doctor.  30 tablet  0   No current facility-administered medications for this visit.    History   Social History  . Marital Status: Divorced    Spouse Name: N/A    Number of Children: N/A  . Years of Education: N/A   Occupational History  . Not on file.   Social History Main Topics  . Smoking status: Never Smoker   . Smokeless tobacco: Never Used  . Alcohol Use: No  . Drug Use: No  . Sexual Activity: Not on file   Other Topics Concern  . Not on file   Social History Narrative  . No narrative on file    Family History  Problem Relation Age of Onset  . Hypertension Mother   . Hyperlipidemia Mother        Vitals: Blood pressure 132/86, pulse 70, temperature 98.9 F (37.2 C), temperature source Oral, resp. rate 16, height 4\' 11"  (1.499 m), weight 137 lb 9.6 oz (62.415 kg), last menstrual period 04/15/2013.    Alvino Chapel, MD 05/01/2013, 2:09 PM                         Consult Note: Gyn-Onc   Ariel [name] 37 y.o. female  Chief Complaint  Patient presents with  . VAIN    Reconsult    Interval History:   HPI:  Allergies  Allergen Reactions  . Oxycodone Nausea And Vomiting  . Adhesive [Tape] Rash  . Bactrim Swelling and Rash  . Imuran [Azathioprine Sodium] Rash  . Warfarin Sodium Rash    Past Medical History  Diagnosis Date  . Hypercholesterolemia   . 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 Vulvar dystrophy   . History of renal transplant   . Nephrogenic diabetes insipidus     congenital  . Renal failure, chronic     Dialysis in the past  . 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 Anemia   . Cancer     pre cervical    Past Surgical History  Procedure Laterality Date  . Kidney transplant  1995, 2008  . Tubal ligation    . Av fistula placement    . Simple vulvectomy  06/19/2011    Partial simple left  . Parathyroidectomy  2004    Portion  on the parathyroid gland transplanted in R forearm  . Insertion of dialysis catheter    . Removal of a dialysis catheter      Current Outpatient Prescriptions  Medication Sig Dispense Refill  .  Epoetin Alfa (PROCRIT IJ) Inject as directed every 21 ( twenty-one) days.      . Magnesium 200 MG TABS Take 1 tablet by mouth daily.        . mycophenolate (MYFORTIC) 360 MG TBEC Take 360 mg by mouth 2 (two) times daily.       . predniSONE (DELTASONE) 5 MG tablet Take 5 mg by mouth daily.        . simvastatin (ZOCOR) 10 MG tablet Take 0.5 tablets (5 mg total) by mouth daily.      . tacrolimus (PROGRAF) 1 MG capsule Take 4 mg by mouth 2 (two) times daily.        [name] HYDROcodone-acetaminophen (NORCO/VICODIN) 5-325 MG per tablet Take 1-2 tablets every 6 hours as needed for severe pain  8 tablet  0  . Rivaroxaban (XARELTO) 15 MG TABS tablet Take 1 tablet (15 mg total) by mouth 2 (two) times daily with a meal. For 3 weeks. Then take 20mg  PO QD with food (separate rx for 20mg ).  41 tablet  0  . Rivaroxaban (XARELTO) 20 MG TABS Take 1 tablet (20 mg total) by mouth daily. Take with food. Start on 08/28/12. Will need further refills from primary doctor.  30 tablet  0   No current facility-administered medications for this visit.    History   Social History  . Marital Status: Divorced    Spouse Name: N/A    Number of Children: N/A  . Years of Education: N/A   Occupational History  . Not on file.   Social History Main Topics  . Smoking status: Never Smoker   . Smokeless tobacco: Never Used  . Alcohol Use: No  . Drug Use: No  . Sexual Activity: Not on file   Other Topics Concern  . Not on file   Social History Narrative  . No narrative on file    Family History  Problem Relation Age of Onset  . Hypertension Mother   . Hyperlipidemia Mother     Review of Systems:  Vitals: Blood pressure 132/86, pulse 70, temperature 98.9 F (37.2 C), temperature source Oral, resp. rate 16, height 4\' 11"   (1.499 m), weight 137 lb 9.6 oz (62.415 kg), last menstrual period 04/15/2013.  Physical Exam:  Assessment/Plan:   Alvino Chapel, MD 05/01/2013, 2:09 PM

## 2013-05-05 DIAGNOSIS — L089 Local infection of the skin and subcutaneous tissue, unspecified: Secondary | ICD-10-CM | POA: Diagnosis not present

## 2013-05-05 DIAGNOSIS — T148XXA Other injury of unspecified body region, initial encounter: Secondary | ICD-10-CM | POA: Diagnosis not present

## 2013-05-05 DIAGNOSIS — R109 Unspecified abdominal pain: Secondary | ICD-10-CM | POA: Diagnosis not present

## 2013-05-05 DIAGNOSIS — S0100XA Unspecified open wound of scalp, initial encounter: Secondary | ICD-10-CM | POA: Diagnosis not present

## 2013-05-05 DIAGNOSIS — T1490XA Injury, unspecified, initial encounter: Secondary | ICD-10-CM | POA: Diagnosis not present

## 2013-05-15 DIAGNOSIS — Z6828 Body mass index (BMI) 28.0-28.9, adult: Secondary | ICD-10-CM | POA: Diagnosis not present

## 2013-05-15 DIAGNOSIS — S0190XA Unspecified open wound of unspecified part of head, initial encounter: Secondary | ICD-10-CM | POA: Diagnosis not present

## 2013-05-15 DIAGNOSIS — E7800 Pure hypercholesterolemia, unspecified: Secondary | ICD-10-CM | POA: Diagnosis not present

## 2013-05-15 DIAGNOSIS — F411 Generalized anxiety disorder: Secondary | ICD-10-CM | POA: Diagnosis not present

## 2013-06-10 DIAGNOSIS — Z79899 Other long term (current) drug therapy: Secondary | ICD-10-CM | POA: Diagnosis not present

## 2013-06-10 DIAGNOSIS — D649 Anemia, unspecified: Secondary | ICD-10-CM | POA: Diagnosis not present

## 2013-06-10 DIAGNOSIS — E785 Hyperlipidemia, unspecified: Secondary | ICD-10-CM | POA: Diagnosis not present

## 2013-06-10 DIAGNOSIS — Z940 Kidney transplant status: Secondary | ICD-10-CM | POA: Diagnosis not present

## 2013-06-15 DIAGNOSIS — N183 Chronic kidney disease, stage 3 unspecified: Secondary | ICD-10-CM | POA: Diagnosis not present

## 2013-06-15 DIAGNOSIS — D638 Anemia in other chronic diseases classified elsewhere: Secondary | ICD-10-CM | POA: Diagnosis not present

## 2013-06-17 DIAGNOSIS — Z940 Kidney transplant status: Secondary | ICD-10-CM | POA: Diagnosis not present

## 2013-06-22 DIAGNOSIS — N183 Chronic kidney disease, stage 3 unspecified: Secondary | ICD-10-CM | POA: Diagnosis not present

## 2013-06-22 DIAGNOSIS — Z940 Kidney transplant status: Secondary | ICD-10-CM | POA: Diagnosis not present

## 2013-06-22 DIAGNOSIS — Z79899 Other long term (current) drug therapy: Secondary | ICD-10-CM | POA: Diagnosis not present

## 2013-06-22 DIAGNOSIS — Z23 Encounter for immunization: Secondary | ICD-10-CM | POA: Diagnosis not present

## 2013-08-17 DIAGNOSIS — F411 Generalized anxiety disorder: Secondary | ICD-10-CM | POA: Diagnosis not present

## 2013-08-17 DIAGNOSIS — Z940 Kidney transplant status: Secondary | ICD-10-CM | POA: Diagnosis not present

## 2013-08-17 DIAGNOSIS — Z79899 Other long term (current) drug therapy: Secondary | ICD-10-CM | POA: Diagnosis not present

## 2013-08-17 DIAGNOSIS — R609 Edema, unspecified: Secondary | ICD-10-CM | POA: Diagnosis not present

## 2013-08-22 DIAGNOSIS — N390 Urinary tract infection, site not specified: Secondary | ICD-10-CM | POA: Diagnosis not present

## 2013-09-11 DIAGNOSIS — Z940 Kidney transplant status: Secondary | ICD-10-CM | POA: Diagnosis not present

## 2013-09-11 DIAGNOSIS — Z79899 Other long term (current) drug therapy: Secondary | ICD-10-CM | POA: Diagnosis not present

## 2013-09-11 DIAGNOSIS — E785 Hyperlipidemia, unspecified: Secondary | ICD-10-CM | POA: Diagnosis not present

## 2013-09-11 DIAGNOSIS — D649 Anemia, unspecified: Secondary | ICD-10-CM | POA: Diagnosis not present

## 2013-09-14 DIAGNOSIS — Z940 Kidney transplant status: Secondary | ICD-10-CM | POA: Diagnosis not present

## 2013-09-14 DIAGNOSIS — D649 Anemia, unspecified: Secondary | ICD-10-CM | POA: Diagnosis not present

## 2013-09-14 DIAGNOSIS — N183 Chronic kidney disease, stage 3 unspecified: Secondary | ICD-10-CM | POA: Diagnosis not present

## 2013-09-14 DIAGNOSIS — I10 Essential (primary) hypertension: Secondary | ICD-10-CM | POA: Diagnosis not present

## 2013-10-03 ENCOUNTER — Emergency Department (HOSPITAL_COMMUNITY): Payer: Medicare Other

## 2013-10-03 ENCOUNTER — Encounter (HOSPITAL_COMMUNITY): Payer: Self-pay | Admitting: Emergency Medicine

## 2013-10-03 ENCOUNTER — Emergency Department (HOSPITAL_COMMUNITY)
Admission: EM | Admit: 2013-10-03 | Discharge: 2013-10-03 | Disposition: A | Discharge status: Home / Self Care | Payer: Medicare Other | Attending: Emergency Medicine | Admitting: Emergency Medicine

## 2013-10-03 DIAGNOSIS — E7800 Pure hypercholesterolemia, unspecified: Secondary | ICD-10-CM | POA: Diagnosis not present

## 2013-10-03 DIAGNOSIS — N251 Nephrogenic diabetes insipidus: Secondary | ICD-10-CM | POA: Diagnosis not present

## 2013-10-03 DIAGNOSIS — Z79899 Other long term (current) drug therapy: Secondary | ICD-10-CM | POA: Diagnosis not present

## 2013-10-03 DIAGNOSIS — Z8742 Personal history of other diseases of the female genital tract: Secondary | ICD-10-CM | POA: Insufficient documentation

## 2013-10-03 DIAGNOSIS — N186 End stage renal disease: Secondary | ICD-10-CM | POA: Insufficient documentation

## 2013-10-03 DIAGNOSIS — Z992 Dependence on renal dialysis: Secondary | ICD-10-CM | POA: Insufficient documentation

## 2013-10-03 DIAGNOSIS — Z940 Kidney transplant status: Secondary | ICD-10-CM | POA: Insufficient documentation

## 2013-10-03 DIAGNOSIS — M25539 Pain in unspecified wrist: Secondary | ICD-10-CM | POA: Insufficient documentation

## 2013-10-03 DIAGNOSIS — Z8619 Personal history of other infectious and parasitic diseases: Secondary | ICD-10-CM | POA: Diagnosis not present

## 2013-10-03 DIAGNOSIS — Z862 Personal history of diseases of the blood and blood-forming organs and certain disorders involving the immune mechanism: Secondary | ICD-10-CM | POA: Insufficient documentation

## 2013-10-03 DIAGNOSIS — IMO0002 Reserved for concepts with insufficient information to code with codable children: Secondary | ICD-10-CM | POA: Diagnosis not present

## 2013-10-03 DIAGNOSIS — Z8541 Personal history of malignant neoplasm of cervix uteri: Secondary | ICD-10-CM | POA: Diagnosis not present

## 2013-10-03 DIAGNOSIS — M25529 Pain in unspecified elbow: Secondary | ICD-10-CM | POA: Diagnosis not present

## 2013-10-03 DIAGNOSIS — Z7901 Long term (current) use of anticoagulants: Secondary | ICD-10-CM | POA: Diagnosis not present

## 2013-10-03 DIAGNOSIS — M25522 Pain in left elbow: Secondary | ICD-10-CM

## 2013-10-03 LAB — POCT I-STAT, CHEM 8
BUN: 21 mg/dL (ref 6–23)
Calcium, Ion: 1.29 mmol/L — ABNORMAL HIGH (ref 1.12–1.23)
Chloride: 105 mEq/L (ref 96–112)
Creatinine, Ser: 1.4 mg/dL — ABNORMAL HIGH (ref 0.50–1.10)
Glucose, Bld: 78 mg/dL (ref 70–99)
HEMATOCRIT: 41 % (ref 36.0–46.0)
HEMOGLOBIN: 13.9 g/dL (ref 12.0–15.0)
Potassium: 3.8 mEq/L (ref 3.7–5.3)
SODIUM: 138 meq/L (ref 137–147)
TCO2: 22 mmol/L (ref 0–100)

## 2013-10-03 LAB — PROTIME-INR
INR: 0.97 (ref 0.00–1.49)
PROTHROMBIN TIME: 12.7 s (ref 11.6–15.2)

## 2013-10-03 LAB — APTT: APTT: 36 s (ref 24–37)

## 2013-10-03 MED ORDER — ENOXAPARIN SODIUM 60 MG/0.6ML ~~LOC~~ SOLN
60.0000 mg | Freq: Two times a day (BID) | SUBCUTANEOUS | Status: DC
  Administered 2013-10-03: 60 mg via SUBCUTANEOUS
  Filled 2013-10-03: qty 0.6

## 2013-10-03 NOTE — Discharge Instructions (Signed)
Most Likely Lateral Epicondylitis (Tennis Elbow) with Rehab Lateral epicondylitis involves inflammation and pain around the outer portion of the elbow. The pain is caused by inflammation of the tendons in the forearm that bring back (extend) the wrist. Lateral epicondylittis is also called tennis elbow, because it is very common in tennis players. However, it may occur in any individual who extends the wrist repetitively. If lateral epicondylitis is left untreated, it may become a chronic problem. SYMPTOMS   Pain, tenderness, and inflammation on the outer (lateral) side of the elbow.  Pain or weakness with gripping activities.  Pain that increases with wrist twisting motions (playing tennis, using a screwdriver, opening a door or a jar).  Pain with lifting objects, including a coffee cup. CAUSES  Lateral epicondylitis is caused by inflammation of the tendons that extend the wrist. Causes of injury may include:  Repetitive stress and strain on the muscles and tendons that extend the wrist.  Sudden change in activity level or intensity.  Incorrect grip in racquet sports.  Incorrect grip size of racquet (often too large).  Incorrect hitting position or technique (usually backhand, leading with the elbow).  Using a racket that is too heavy. RISK INCREASES WITH:  Sports or occupations that require repetitive and/or strenuous forearm and wrist movements (tennis, squash, racquetball, carpentry).  Poor wrist and forearm strength and flexibility.  Failure to warm up properly before activity.  Resuming activity before healing, rehabilitation, and conditioning are complete. PREVENTION   Warm up and stretch properly before activity.  Maintain physical fitness:  Strength, flexibility, and endurance.  Cardiovascular fitness.  Wear and use properly fitted equipment.  Learn and use proper technique and have a coach correct improper technique.  Wear a tennis elbow (counterforce)  brace. PROGNOSIS  The course of this condition depends on the degree of the injury. If treated properly, acute cases (symptoms lasting less than 4 weeks) are often resolved in 2 to 6 weeks. Chronic (longer lasting cases) often resolve in 3 to 6 months, but may require physical therapy. RELATED COMPLICATIONS   Frequently recurring symptoms, resulting in a chronic problem. Properly treating the problem the first time decreases frequency of recurrence.  Chronic inflammation, scarring tendon degeneration, and partial tendon tear, requiring surgery.  Delayed healing or resolution of symptoms. TREATMENT  Treatment first involves the use of ice and medicine, to reduce pain and inflammation. Strengthening and stretching exercises may help reduce discomfort, if performed regularly. These exercises may be performed at home, if the condition is an acute injury. Chronic cases may require a referral to a physical therapist for evaluation and treatment. Your caregiver may advise a corticosteroid injection, to help reduce inflammation. Rarely, surgery is needed. MEDICATION  If pain medicine is needed, nonsteroidal anti-inflammatory medicines (aspirin and ibuprofen), or other minor pain relievers (acetaminophen), are often advised.  Do not take pain medicine for 7 days before surgery.  Prescription pain relievers may be given, if your caregiver thinks they are needed. Use only as directed and only as much as you need.  Corticosteroid injections may be recommended. These injections should be reserved only for the most severe cases, because they can only be given a certain number of times. HEAT AND COLD  Cold treatment (icing) should be applied for 10 to 15 minutes every 2 to 3 hours for inflammation and pain, and immediately after activity that aggravates your symptoms. Use ice packs or an ice massage.  Heat treatment may be used before performing stretching and strengthening activities prescribed  by your  caregiver, physical therapist, or athletic trainer. Use a heat pack or a warm water soak. SEEK MEDICAL CARE IF: Symptoms get worse or do not improve in 2 weeks, despite treatment. EXERCISES  RANGE OF MOTION (ROM) AND STRETCHING EXERCISES - Epicondylitis, Lateral (Tennis Elbow) These exercises may help you when beginning to rehabilitate your injury. Your symptoms may go away with or without further involvement from your physician, physical therapist or athletic trainer. While completing these exercises, remember:   Restoring tissue flexibility helps normal motion to return to the joints. This allows healthier, less painful movement and activity.  An effective stretch should be held for at least 30 seconds.  A stretch should never be painful. You should only feel a gentle lengthening or release in the stretched tissue. RANGE OF MOTION  Wrist Flexion, Active-Assisted  Extend your right / left elbow with your fingers pointing down.*  Gently pull the back of your hand towards you, until you feel a gentle stretch on the top of your forearm.  Hold this position for __________ seconds. Repeat __________ times. Complete this exercise __________ times per day.  *If directed by your physician, physical therapist or athletic trainer, complete this stretch with your elbow bent, rather than extended. RANGE OF MOTION  Wrist Extension, Active-Assisted  Extend your right / left elbow and turn your palm upwards.*  Gently pull your palm and fingertips back, so your wrist extends and your fingers point more toward the ground.  You should feel a gentle stretch on the inside of your forearm.  Hold this position for __________ seconds. Repeat __________ times. Complete this exercise __________ times per day. *If directed by your physician, physical therapist or athletic trainer, complete this stretch with your elbow bent, rather than extended. STRETCH - Wrist Flexion  Place the back of your right / left  hand on a tabletop, leaving your elbow slightly bent. Your fingers should point away from your body.  Gently press the back of your hand down onto the table by straightening your elbow. You should feel a stretch on the top of your forearm.  Hold this position for __________ seconds. Repeat __________ times. Complete this stretch __________ times per day.  STRETCH  Wrist Extension   Place your right / left fingertips on a tabletop, leaving your elbow slightly bent. Your fingers should point backwards.  Gently press your fingers and palm down onto the table by straightening your elbow. You should feel a stretch on the inside of your forearm.  Hold this position for __________ seconds. Repeat __________ times. Complete this stretch __________ times per day.  STRENGTHENING EXERCISES - Epicondylitis, Lateral (Tennis Elbow) These exercises may help you when beginning to rehabilitate your injury. They may resolve your symptoms with or without further involvement from your physician, physical therapist or athletic trainer. While completing these exercises, remember:   Muscles can gain both the endurance and the strength needed for everyday activities through controlled exercises.  Complete these exercises as instructed by your physician, physical therapist or athletic trainer. Increase the resistance and repetitions only as guided.  You may experience muscle soreness or fatigue, but the pain or discomfort you are trying to eliminate should never worsen during these exercises. If this pain does get worse, stop and make sure you are following the directions exactly. If the pain is still present after adjustments, discontinue the exercise until you can discuss the trouble with your caregiver. STRENGTH Wrist Flexors  Sit with your right / left forearm  palm-up and fully supported on a table or countertop. Your elbow should be resting below the height of your shoulder. Allow your wrist to extend over the  edge of the surface.  Loosely holding a __________ weight, or a piece of rubber exercise band or tubing, slowly curl your hand up toward your forearm.  Hold this position for __________ seconds. Slowly lower the wrist back to the starting position in a controlled manner. Repeat __________ times. Complete this exercise __________ times per day.  STRENGTH  Wrist Extensors  Sit with your right / left forearm palm-down and fully supported on a table or countertop. Your elbow should be resting below the height of your shoulder. Allow your wrist to extend over the edge of the surface.  Loosely holding a __________ weight, or a piece of rubber exercise band or tubing, slowly curl your hand up toward your forearm.  Hold this position for __________ seconds. Slowly lower the wrist back to the starting position in a controlled manner. Repeat __________ times. Complete this exercise __________ times per day.  STRENGTH - Ulnar Deviators  Stand with a ____________________ weight in your right / left hand, or sit while holding a rubber exercise band or tubing, with your healthy arm supported on a table or countertop.  Move your wrist, so that your pinkie travels toward your forearm and your thumb moves away from your forearm.  Hold this position for __________ seconds and then slowly lower the wrist back to the starting position. Repeat __________ times. Complete this exercise __________ times per day STRENGTH - Radial Deviators  Stand with a ____________________ weight in your right / left hand, or sit while holding a rubber exercise band or tubing, with your injured arm supported on a table or countertop.  Raise your hand upward in front of you or pull up on the rubber tubing.  Hold this position for __________ seconds and then slowly lower the wrist back to the starting position. Repeat __________ times. Complete this exercise __________ times per day. STRENGTH  Forearm Supinators   Sit with  your right / left forearm supported on a table, keeping your elbow below shoulder height. Rest your hand over the edge, palm down.  Gently grip a hammer or a soup ladle.  Without moving your elbow, slowly turn your palm and hand upward to a "thumbs-up" position.  Hold this position for __________ seconds. Slowly return to the starting position. Repeat __________ times. Complete this exercise __________ times per day.  STRENGTH  Forearm Pronators   Sit with your right / left forearm supported on a table, keeping your elbow below shoulder height. Rest your hand over the edge, palm up.  Gently grip a hammer or a soup ladle.  Without moving your elbow, slowly turn your palm and hand upward to a "thumbs-up" position.  Hold this position for __________ seconds. Slowly return to the starting position. Repeat __________ times. Complete this exercise __________ times per day.  STRENGTH - Grip  Grasp a tennis ball, a dense sponge, or a large, rolled sock in your hand.  Squeeze as hard as you can, without increasing any pain.  Hold this position for __________ seconds. Release your grip slowly. Repeat __________ times. Complete this exercise __________ times per day.  STRENGTH - Elbow Extensors, Isometric  Stand or sit upright, on a firm surface. Place your right / left arm so that your palm faces your stomach, and it is at the height of your waist.  Place your opposite  hand on the underside of your forearm. Gently push up as your right / left arm resists. Push as hard as you can with both arms, without causing any pain or movement at your right / left elbow. Hold this stationary position for __________ seconds. Gradually release the tension in both arms. Allow your muscles to relax completely before repeating. Document Released: 08/20/2005 Document Revised: 11/12/2011 Document Reviewed: 12/02/2008 [facility] Patient Information 2014 [place], [place].     Rule Out Deep Vein Thrombosis A deep  vein thrombosis (DVT) is a blood clot that develops in the deep, larger veins of the leg, arm, or pelvis. These are more dangerous than clots that might form in veins near the surface of the body. A DVT can lead to complications if the clot breaks off and travels in the bloodstream to the lungs.  A DVT can damage the valves in your leg veins, so that instead of flowing upward, the blood pools in the lower leg. This is called post-thrombotic syndrome, and it can result in pain, swelling, discoloration, and sores on the leg. CAUSES Usually, several things contribute to blood clots forming. Contributing factors include:  The flow of blood slows down.  The inside of the vein is damaged in some way.  You have a condition that makes blood clot more easily. RISK FACTORS Some people are more likely than others to develop blood clots. Risk factors include:   Older age, especially over 58 years of age.  Having a family history of blood clots or if you have already had a blot clot.  Having major or lengthy surgery. This is especially true for surgery on the hip, knee, or belly (abdomen). Hip surgery is particularly high risk.  Breaking a hip or leg.  Sitting or lying still for a long time. This includes long-distance travel, paralysis, or recovery from an illness or surgery.  Having cancer or cancer treatment.  Having a long, thin tube (catheter) placed inside a vein during a medical procedure.  Being overweight (obese).  Pregnancy and childbirth.  Hormone changes make the blood clot more easily during pregnancy.  The fetus puts pressure on the veins of the pelvis.  There is a risk of injury to veins during delivery or a caesarean. The risk is highest just after childbirth.  Medicines with the female hormone estrogen. This includes birth control pills and hormone replacement therapy.  Smoking.  Other circulation or heart problems.  SIGNS AND SYMPTOMS When a clot forms, it can either  partially or totally block the blood flow in that vein. Symptoms of a DVT can include:  Swelling of the leg or arm, especially if one side is much worse.  Warmth and redness of the leg or arm, especially if one side is much worse.  Pain in an arm or leg. If the clot is in the leg, symptoms may be more noticeable or worse when standing or walking. The symptoms of a DVT that has traveled to the lungs (pulmonary embolism, PE) usually start suddenly and include:  Shortness of breath.  Coughing.  Coughing up blood or blood-tinged phlegm.  Chest pain. The chest pain is often worse with deep breaths.  Rapid heartbeat. Anyone with these symptoms should get emergency medical treatment right away. Call your local emergency services (911 in the [place] if you have these symptoms. DIAGNOSIS If a DVT is suspected, your health care provider will take a full medical history and perform a physical exam. Tests that also may be required include:  Blood tests, including studies of the clotting properties of the blood.  Ultrasonography to see if you have clots in your legs or lungs.  X-rays to show the flow of blood when dye is injected into the veins (venography).  Studies of your lungs if you have any chest symptoms. PREVENTION  Exercise the legs regularly. Take a brisk 30-minute walk every day.  Maintain a weight that is appropriate for your height.  Avoid sitting or lying in bed for long periods of time without moving your legs.  Women, particularly those over the age of 4 years, should consider the risks and benefits of taking estrogen medicines, including birth control pills.  Do not smoke, especially if you take estrogen medicines.  Long-distance travel can increase your risk of DVT. You should exercise your legs by walking or pumping the muscles every hour.  In-hospital prevention:  Many of the risk factors above relate to situations that exist with hospitalization, either for  illness, injury, or elective surgery.  Your health care provider will assess you for the need for venous thromboembolism prophylaxis when you are admitted to the hospital. If you are having surgery, your surgeon will assess you the day of or day after surgery.  Prevention may include medical and nonmedical measures. TREATMENT Once identified, a DVT can be treated. It can also be prevented in some circumstances. Once you have had a DVT, you may be at increased risk for a DVT in the future. The most common treatment for DVT is blood thinning (anticoagulant) medicine, which reduces the blood's tendency to clot. Anticoagulants can stop new blood clots from forming and stop old ones from growing. They cannot dissolve existing clots. Your body does this by itself over time. Anticoagulants can be given by mouth, by IV access, or by injection. Your health care provider will determine the best program for you. Other medicines or treatments that may be used are:  Heparin or related medicines (low molecular weight heparin) are usually the first treatment for a blood clot. They act quickly. However, they cannot be taken orally.  Heparin can cause a fall in a component of blood that stops bleeding and forms blood clots (platelets). You will be monitored with blood tests to be sure this does not occur.  Warfarin is an anticoagulant that can be swallowed. It takes a few days to start working, so usually heparin or related medicines are used in combination. Once warfarin is working, heparin is usually stopped.  Less commonly, clot dissolving drugs (thrombolytics) are used to dissolve a DVT. They carry a high risk of bleeding, so they are used mainly in severe cases, where your life or a limb is threatened.  Very rarely, a blood clot in the leg needs to be removed surgically.  If you are unable to take anticoagulants, your health care provider may arrange for you to have a filter placed in a main vein in your  abdomen. This filter prevents clots from traveling to your lungs. HOME CARE INSTRUCTIONS  Take all medicines prescribed by your health care provider. Only take over-the-counter or prescription medicines for pain, fever, or discomfort as directed by your health care provider.  Warfarin. Most people will continue taking warfarin after hospital discharge. Your health care provider will advise you on the length of treatment (usually 3 6 months, sometimes lifelong).  Too much and too little warfarin are both dangerous. Too much warfarin increases the risk of bleeding. Too little warfarin continues to allow the risk for blood  clots. While taking warfarin, you will need to have regular blood tests to measure your blood clotting time. These blood tests usually include both the prothrombin time (PT) and international normalized ratio (INR) tests. The PT and INR results allow your health care provider to adjust your dose of warfarin. The dose can change for many reasons. It is critically important that you take warfarin exactly as prescribed, and that you have your PT and INR levels drawn exactly as directed.  Many foods, especially foods high in vitamin K, can interfere with warfarin and affect the PT and INR results. Foods high in vitamin K include spinach, kale, broccoli, cabbage, collard and turnip greens, brussel sprouts, peas, cauliflower, seaweed, and parsley as well as beef and pork liver, green tea, and soybean oil. You should eat a consistent amount of foods high in vitamin K. Avoid major changes in your diet, or notify your health care provider before changing your diet. Arrange a visit with a dietitian to answer your questions.  Many medicines can interfere with warfarin and affect the PT and INR results. You must tell your health care provider about any and all medicines you take. This includes all vitamins and supplements. Be especially cautious with aspirin and anti-inflammatory medicines. Ask your  health care provider before taking these. Do not take or discontinue any prescribed or over-the-counter medicine except on the advice of your health care provider or pharmacist.  Warfarin can have side effects, primarily excessive bruising or bleeding. You will need to hold pressure over cuts for longer than usual. Your health care provider or pharmacist will discuss other potential side effects.  Alcohol can change the body's ability to handle warfarin. It is best to avoid alcoholic drinks or consume only very small amounts while taking warfarin. Notify your health care provider if you change your alcohol intake.  Notify your dentist or other health care providers before procedures.  Activity. Ask your health care provider how soon you can go back to normal activities. It is important to stay active to prevent blood clots. If you are on anticoagulant medicine, avoid contact sports.  Exercise. It is very important to exercise. This is especially important while traveling, sitting, or standing for long periods of time. Exercise your legs by walking or by pumping the muscles frequently. Take frequent walks.  Compression stockings. These are tight elastic stockings that apply pressure to the lower legs. This pressure can help keep the blood in the legs from clotting. You may need to wear compression stockings at home to help prevent a DVT.  Do not smoke. If you smoke, quit. Ask your health care provider for help with quitting smoking.  Learn as much as you can about DVT. Knowing more about the condition should help you keep it from coming back.  Wear a medical alert bracelet or carry a medical alert card. SEEK MEDICAL CARE IF:  You notice a rapid heartbeat.  You feel weaker or more tired than usual.  You feel faint.  You notice increased bruising.  You feel your symptoms are not getting better in the time expected.  You believe you are having side effects of medicine. SEEK IMMEDIATE  MEDICAL CARE IF:  You have chest pain.  You have trouble breathing.  You have new or increased swelling or pain in one leg.  You cough up blood.  You notice blood in vomit, in a bowel movement, or in urine. MAKE SURE YOU:  Understand these instructions.  Will watch your condition.  Will get help right away if you are not doing well or get worse. Document Released: 08/20/2005 Document Revised: 06/10/2013 Document Reviewed: 04/27/2013 [facility] Patient Information 2014 [place].

## 2013-10-03 NOTE — ED Notes (Signed)
Pt A&Ox4, ambulatory at discharge, verbalizing no complaints at this time. Pt verbalized understanding to come to ED registration desk tomorrow morning at 8am for her vascular study to rule out DVT.

## 2013-10-03 NOTE — ED Provider Notes (Signed)
TIME SEEN: 9:15 PM  CHIEF COMPLAINT: Left elbow pain  HPI: Patient is a 38 y.o. 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 She denies any repetitive movements with her arm. Denies any known injury. She's had a prior left upper extremity DVT in November 2013 is concerned she may have another DVT today. She's had some mild swelling over the lateral posterior elbow. No redness or warmth. No fevers. No numbness or tingling. No focal weakness.  Patient denies any chest pain or shortness of breath.  ROS: See HPI Constitutional: no fever  Eyes: no drainage  ENT: no runny nose   Cardiovascular:  no chest pain  Resp: no SOB  GI: no vomiting GU: no dysuria Integumentary: no rash  Allergy: no hives  Musculoskeletal: no leg swelling  Neurological: no slurred speech ROS otherwise negative  PAST MEDICAL HISTORY/PAST SURGICAL HISTORY:  Past Medical History  Diagnosis Date  . Hypercholesterolemia   . 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 Vulvar dystrophy   . History of renal transplant   . Nephrogenic diabetes insipidus     congenital  . Renal failure, chronic     Dialysis in the past  . 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 Anemia   . Cancer     pre cervical    MEDICATIONS:  Prior to Admission medications   Medication Sig Start Date End Date Taking? Authorizing Provider  furosemide (LASIX) 40 MG tablet Take 40 mg by mouth daily.   Yes Historical Provider, MD  Magnesium 200 MG TABS Take 1 tablet by mouth daily.     Yes Historical Provider, MD  mycophenolate (MYFORTIC) 360 MG TBEC Take 360 mg by mouth 2 (two) times daily.    Yes Historical Provider, MD  predniSONE (DELTASONE) 5 MG tablet Take 5 mg by mouth daily.     Yes Historical Provider, MD  simvastatin (ZOCOR) 10 MG tablet  Take 10 mg by mouth daily. 08/07/12  Yes Leeanne Rio, MD  tacrolimus (PROGRAF) 1 MG capsule Take 4 mg by mouth 2 (two) times daily.     Yes Historical Provider, MD  Rivaroxaban (XARELTO) 15 MG TABS tablet Take 1 tablet (15 mg total) by mouth 2 (two) times daily with a meal. For 3 weeks. Then take 20mg  PO QD with food (separate rx for 20mg ). 08/07/12 08/27/12  Leeanne Rio, MD    ALLERGIES:  Allergies  Allergen Reactions  . Oxycodone Nausea And Vomiting  . Adhesive [Tape] Rash  . Bactrim Swelling and Rash  . Imuran [Azathioprine Sodium] Rash  . Warfarin Sodium Rash    SOCIAL HISTORY:  History  Substance Use Topics  . Smoking status: Never Smoker   . Smokeless tobacco: Never Used  . Alcohol Use: No    FAMILY HISTORY: Family History  Problem Relation Age of Onset  . Hypertension Mother   . Hyperlipidemia Mother     EXAM: BP 148/101  Pulse 80  Temp(Src) 97.7 F (36.5 C) (Oral)  Resp 18  Ht 4\' 11"  (1.499 m)  Wt 139 lb 9.6 oz (63.322 kg)  BMI 28.18 kg/m2  SpO2 100%  LMP 09/13/2013 CONSTITUTIONAL: Alert and oriented and responds appropriately to questions. Well-appearing; well-nourished HEAD: Normocephalic EYES: Conjunctivae clear, PERRL ENT: normal nose; no rhinorrhea; moist mucous  membranes; pharynx without lesions noted NECK: Supple, no meningismus, no LAD  CARD: RRR; S1 and S2 appreciated; no murmurs, no clicks, no rubs, no gallops RESP: Normal chest excursion without splinting or tachypnea; breath sounds clear and equal bilaterally; no wheezes, no rhonchi, no rales,  ABD/GI: Normal bowel sounds; non-distended; soft, non-tender, no rebound, no guarding BACK:  The back appears normal and is non-tender to palpation, there is no CVA tenderness EXT:  patient is tender to palpation over the left lateral elbow with pain with full extension and pronation and supination of the left arm, there is no joint effusion, no swelling of the olecranon bursa, no erythema or  warmth, no induration, sensation to light touch intact diffusely, 2+ radial pulses bilaterally, hands are warm and well perfused, full range of motion in the right shoulder and right breast, otherwise Normal ROM in all joints; non-tender to palpation; no edema; normal capillary refill; no cyanosis    SKIN: Normal color for age and race; warm NEURO: Moves all extremities equally PSYCH: The patient's mood and manner are appropriate. Grooming and personal hygiene are appropriate.  MEDICAL DECISION MAKING: Patient here with left elbow pain. She is currently nontoxic appearing with no signs of septic arthritis, olecranon bursitis. X-ray shows no dislocation or fracture. Suspicion for DVT but given patient has had a prior left upper extremity venous thrombosis, she will need venous Doppler. This is unable to be performed at this time of night. Will obtain basic labs and ensure normal kidney function prior to giving Lovenox. We'll discharge home with order for outpatient venous Doppler.   ED PROGRESS: Labs are reassuring. Her creatinine is 1.4 which is her baseline. She has received a dose of Lovenox 60 mg subcutaneously. Will order outpatient venous Doppler for tomorrow morning. Have given strict return precautions. Patient verbalizes understanding is comfortable plan.     [place], DO 10/03/13 2322

## 2013-10-03 NOTE — Progress Notes (Signed)
ANTICOAGULATION CONSULT NOTE - Initial Consult  Pharmacy Consult for Lovenox Indication: r/o DVT  Allergies  Allergen Reactions  . Oxycodone Nausea And Vomiting  . Adhesive [Tape] Rash  . Bactrim Swelling and Rash  . Imuran [Azathioprine Sodium] Rash  . Warfarin Sodium Rash    Patient Measurements: Height: 4\' 11"  (149.9 cm) Weight: 139 lb 9.6 oz (63.322 kg) IBW/kg (Calculated) : 43.2  Vital Signs: Temp: 97.7 F (36.5 C) (01/31 1923) Temp src: Oral (01/31 1923) BP: 148/101 mmHg (01/31 1923) Pulse Rate: 80 (01/31 1923)  Labs:  Recent Labs  10/03/13 2135  HGB 13.9  HCT 41.0  CREATININE 1.40*    Estimated Creatinine Clearance: 44.9 ml/min (by C-G formula based on Cr of 1.4).   Medical History: Past Medical History  Diagnosis Date  . Hypercholesterolemia   . 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 Vulvar dystrophy   . History of renal transplant   . Nephrogenic diabetes insipidus     congenital  . Renal failure, chronic     Dialysis in the past  . 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 Anemia   . Cancer     pre cervical    Medications:  See electronic med rec  Assessment: 38 y.o. female presents with swelling and redness in L elbow. Noted h/o DVT - pt was on Xarelto then changed to Pradaxa but stopped ~1 yr ago per pt. To begin Lovenox for r/o DVT. H/H stable at baseline.  Goal of Therapy:  Anti-Xa level 0.6-1 units/ml 4hrs after LMWH dose given Monitor platelets by anticoagulation protocol: Yes   Plan:  1. Lovenox 60mg  SQ q12h. 2. CBC q72h while on lovenox 3. Will f/u diagnostic studies  Sherlon Handing, PharmD, BCPS Clinical pharmacist, pager [phone] 10/03/2013,9:44 PM

## 2013-10-03 NOTE — ED Notes (Signed)
Pt has hx of blood clots, and states pain left arm elbow. Some swelling and redness noted. Denies trauma.

## 2013-10-04 ENCOUNTER — Ambulatory Visit (HOSPITAL_COMMUNITY)
Admission: RE | Admit: 2013-10-04 | Discharge: 2013-10-04 | Disposition: A | Discharge status: Home / Self Care | Payer: Medicare Other | Source: Ambulatory Visit | Attending: Emergency Medicine | Admitting: Emergency Medicine

## 2013-10-04 DIAGNOSIS — M25529 Pain in unspecified elbow: Secondary | ICD-10-CM

## 2013-10-04 DIAGNOSIS — I82819 Embolism and thrombosis of superficial veins of unspecified lower extremities: Secondary | ICD-10-CM | POA: Diagnosis not present

## 2013-10-04 NOTE — Progress Notes (Signed)
VASCULAR LAB PRELIMINARY  PRELIMINARY  PRELIMINARY  PRELIMINARY  Left upper extremity venous Doppler completed.    Preliminary report:  There is no DVT noted in the left upper extremity.  There is SVT noted in the left cephalic vein.  Clavin Ruhlman, RVT 10/04/2013, 9:30 AM

## 2013-10-05 ENCOUNTER — Encounter (HOSPITAL_COMMUNITY): Payer: Medicare Other

## 2013-10-05 ENCOUNTER — Ambulatory Visit (HOSPITAL_COMMUNITY): Payer: Medicare Other

## 2013-10-05 ENCOUNTER — Other Ambulatory Visit (HOSPITAL_COMMUNITY): Payer: Self-pay | Admitting: Emergency Medicine

## 2013-10-05 DIAGNOSIS — M25529 Pain in unspecified elbow: Secondary | ICD-10-CM

## 2013-10-26 DIAGNOSIS — Z79899 Other long term (current) drug therapy: Secondary | ICD-10-CM | POA: Diagnosis not present

## 2013-10-26 DIAGNOSIS — Z940 Kidney transplant status: Secondary | ICD-10-CM | POA: Diagnosis not present

## 2013-11-03 DIAGNOSIS — R109 Unspecified abdominal pain: Secondary | ICD-10-CM | POA: Diagnosis not present

## 2013-11-17 DIAGNOSIS — Z48298 Encounter for aftercare following other organ transplant: Secondary | ICD-10-CM | POA: Diagnosis not present

## 2013-11-17 DIAGNOSIS — Z940 Kidney transplant status: Secondary | ICD-10-CM | POA: Diagnosis not present

## 2013-11-17 DIAGNOSIS — R109 Unspecified abdominal pain: Secondary | ICD-10-CM | POA: Diagnosis not present

## 2013-11-17 DIAGNOSIS — K654 Sclerosing mesenteritis: Secondary | ICD-10-CM | POA: Diagnosis not present

## 2013-11-17 DIAGNOSIS — Z944 Liver transplant status: Secondary | ICD-10-CM | POA: Diagnosis not present

## 2013-11-17 DIAGNOSIS — R1900 Intra-abdominal and pelvic swelling, mass and lump, unspecified site: Secondary | ICD-10-CM | POA: Diagnosis not present

## 2013-11-17 DIAGNOSIS — R188 Other ascites: Secondary | ICD-10-CM | POA: Diagnosis not present

## 2013-11-25 DIAGNOSIS — N281 Cyst of kidney, acquired: Secondary | ICD-10-CM | POA: Diagnosis not present

## 2013-11-25 DIAGNOSIS — IMO0002 Reserved for concepts with insufficient information to code with codable children: Secondary | ICD-10-CM | POA: Diagnosis not present

## 2013-11-25 DIAGNOSIS — R109 Unspecified abdominal pain: Secondary | ICD-10-CM | POA: Diagnosis not present

## 2013-11-25 DIAGNOSIS — N269 Renal sclerosis, unspecified: Secondary | ICD-10-CM | POA: Diagnosis not present

## 2013-11-25 DIAGNOSIS — Z79899 Other long term (current) drug therapy: Secondary | ICD-10-CM | POA: Diagnosis not present

## 2013-11-25 DIAGNOSIS — Z940 Kidney transplant status: Secondary | ICD-10-CM | POA: Diagnosis not present

## 2013-11-25 DIAGNOSIS — T8610 Unspecified complication of kidney transplant: Secondary | ICD-10-CM | POA: Diagnosis not present

## 2013-12-08 DIAGNOSIS — Z940 Kidney transplant status: Secondary | ICD-10-CM | POA: Diagnosis not present

## 2013-12-08 DIAGNOSIS — Z79899 Other long term (current) drug therapy: Secondary | ICD-10-CM | POA: Diagnosis not present

## 2013-12-08 DIAGNOSIS — D649 Anemia, unspecified: Secondary | ICD-10-CM | POA: Diagnosis not present

## 2013-12-08 DIAGNOSIS — E785 Hyperlipidemia, unspecified: Secondary | ICD-10-CM | POA: Diagnosis not present

## 2013-12-17 DIAGNOSIS — Z940 Kidney transplant status: Secondary | ICD-10-CM | POA: Diagnosis not present

## 2013-12-17 DIAGNOSIS — Z48298 Encounter for aftercare following other organ transplant: Secondary | ICD-10-CM | POA: Diagnosis not present

## 2013-12-17 DIAGNOSIS — R109 Unspecified abdominal pain: Secondary | ICD-10-CM | POA: Diagnosis not present

## 2013-12-18 DIAGNOSIS — Z79899 Other long term (current) drug therapy: Secondary | ICD-10-CM | POA: Diagnosis not present

## 2014-01-21 DIAGNOSIS — N760 Acute vaginitis: Secondary | ICD-10-CM | POA: Diagnosis not present

## 2014-02-01 DIAGNOSIS — Z124 Encounter for screening for malignant neoplasm of cervix: Secondary | ICD-10-CM | POA: Diagnosis not present

## 2014-02-01 DIAGNOSIS — Z01419 Encounter for gynecological examination (general) (routine) without abnormal findings: Secondary | ICD-10-CM | POA: Diagnosis not present

## 2014-02-02 DIAGNOSIS — R87610 Atypical squamous cells of undetermined significance on cytologic smear of cervix (ASC-US): Secondary | ICD-10-CM | POA: Diagnosis not present

## 2014-02-02 DIAGNOSIS — Z124 Encounter for screening for malignant neoplasm of cervix: Secondary | ICD-10-CM | POA: Diagnosis not present

## 2014-02-12 DIAGNOSIS — Z940 Kidney transplant status: Secondary | ICD-10-CM | POA: Diagnosis not present

## 2014-02-12 DIAGNOSIS — D649 Anemia, unspecified: Secondary | ICD-10-CM | POA: Diagnosis not present

## 2014-02-16 DIAGNOSIS — D649 Anemia, unspecified: Secondary | ICD-10-CM | POA: Diagnosis not present

## 2014-02-16 DIAGNOSIS — I10 Essential (primary) hypertension: Secondary | ICD-10-CM | POA: Diagnosis not present

## 2014-02-16 DIAGNOSIS — N390 Urinary tract infection, site not specified: Secondary | ICD-10-CM | POA: Diagnosis not present

## 2014-02-16 DIAGNOSIS — Z940 Kidney transplant status: Secondary | ICD-10-CM | POA: Diagnosis not present

## 2014-03-08 DIAGNOSIS — T8610 Unspecified complication of kidney transplant: Secondary | ICD-10-CM | POA: Diagnosis not present

## 2014-03-17 DIAGNOSIS — R87610 Atypical squamous cells of undetermined significance on cytologic smear of cervix (ASC-US): Secondary | ICD-10-CM | POA: Diagnosis not present

## 2014-03-17 DIAGNOSIS — N183 Chronic kidney disease, stage 3 unspecified: Secondary | ICD-10-CM | POA: Diagnosis not present

## 2014-03-18 DIAGNOSIS — R87610 Atypical squamous cells of undetermined significance on cytologic smear of cervix (ASC-US): Secondary | ICD-10-CM | POA: Diagnosis not present

## 2014-03-18 DIAGNOSIS — B977 Papillomavirus as the cause of diseases classified elsewhere: Secondary | ICD-10-CM | POA: Diagnosis not present

## 2014-04-08 DIAGNOSIS — Z1389 Encounter for screening for other disorder: Secondary | ICD-10-CM | POA: Diagnosis not present

## 2014-04-08 DIAGNOSIS — R1900 Intra-abdominal and pelvic swelling, mass and lump, unspecified site: Secondary | ICD-10-CM | POA: Diagnosis not present

## 2014-04-27 DIAGNOSIS — J069 Acute upper respiratory infection, unspecified: Secondary | ICD-10-CM | POA: Diagnosis not present

## 2014-04-27 DIAGNOSIS — F411 Generalized anxiety disorder: Secondary | ICD-10-CM | POA: Diagnosis not present

## 2014-04-27 DIAGNOSIS — H6120 Impacted cerumen, unspecified ear: Secondary | ICD-10-CM | POA: Diagnosis not present

## 2014-05-19 DIAGNOSIS — N2581 Secondary hyperparathyroidism of renal origin: Secondary | ICD-10-CM | POA: Diagnosis not present

## 2014-05-19 DIAGNOSIS — Z940 Kidney transplant status: Secondary | ICD-10-CM | POA: Diagnosis not present

## 2014-05-19 DIAGNOSIS — D649 Anemia, unspecified: Secondary | ICD-10-CM | POA: Diagnosis not present

## 2014-05-20 DIAGNOSIS — Z23 Encounter for immunization: Secondary | ICD-10-CM | POA: Diagnosis not present

## 2014-05-20 DIAGNOSIS — D649 Anemia, unspecified: Secondary | ICD-10-CM | POA: Diagnosis not present

## 2014-05-20 DIAGNOSIS — N183 Chronic kidney disease, stage 3 unspecified: Secondary | ICD-10-CM | POA: Diagnosis not present

## 2014-05-20 DIAGNOSIS — Z940 Kidney transplant status: Secondary | ICD-10-CM | POA: Diagnosis not present

## 2014-05-20 DIAGNOSIS — I10 Essential (primary) hypertension: Secondary | ICD-10-CM | POA: Diagnosis not present

## 2014-06-09 DIAGNOSIS — H542 Low vision, both eyes: Secondary | ICD-10-CM | POA: Diagnosis not present

## 2014-06-09 DIAGNOSIS — H5213 Myopia, bilateral: Secondary | ICD-10-CM | POA: Diagnosis not present

## 2014-06-22 DIAGNOSIS — Z6827 Body mass index (BMI) 27.0-27.9, adult: Secondary | ICD-10-CM | POA: Diagnosis not present

## 2014-06-22 DIAGNOSIS — D239 Other benign neoplasm of skin, unspecified: Secondary | ICD-10-CM | POA: Diagnosis not present

## 2014-06-22 DIAGNOSIS — F419 Anxiety disorder, unspecified: Secondary | ICD-10-CM | POA: Diagnosis not present

## 2014-07-08 DIAGNOSIS — L820 Inflamed seborrheic keratosis: Secondary | ICD-10-CM | POA: Diagnosis not present

## 2014-07-12 DIAGNOSIS — E780 Pure hypercholesterolemia: Secondary | ICD-10-CM | POA: Diagnosis not present

## 2014-07-12 DIAGNOSIS — E039 Hypothyroidism, unspecified: Secondary | ICD-10-CM | POA: Diagnosis not present

## 2014-07-12 DIAGNOSIS — R0789 Other chest pain: Secondary | ICD-10-CM | POA: Diagnosis not present

## 2014-07-12 DIAGNOSIS — R079 Chest pain, unspecified: Secondary | ICD-10-CM | POA: Diagnosis not present

## 2014-07-12 DIAGNOSIS — Z940 Kidney transplant status: Secondary | ICD-10-CM | POA: Diagnosis not present

## 2014-07-15 DIAGNOSIS — Z6828 Body mass index (BMI) 28.0-28.9, adult: Secondary | ICD-10-CM | POA: Diagnosis not present

## 2014-07-15 DIAGNOSIS — M546 Pain in thoracic spine: Secondary | ICD-10-CM | POA: Diagnosis not present

## 2014-07-15 DIAGNOSIS — R091 Pleurisy: Secondary | ICD-10-CM | POA: Diagnosis not present

## 2014-08-09 DIAGNOSIS — R1030 Lower abdominal pain, unspecified: Secondary | ICD-10-CM | POA: Diagnosis not present

## 2014-08-09 DIAGNOSIS — N186 End stage renal disease: Secondary | ICD-10-CM | POA: Diagnosis not present

## 2014-08-09 DIAGNOSIS — R1031 Right lower quadrant pain: Secondary | ICD-10-CM | POA: Diagnosis not present

## 2014-08-09 DIAGNOSIS — N8320 Unspecified ovarian cysts: Secondary | ICD-10-CM | POA: Diagnosis not present

## 2014-08-09 DIAGNOSIS — N261 Atrophy of kidney (terminal): Secondary | ICD-10-CM | POA: Diagnosis not present

## 2014-08-17 DIAGNOSIS — R87610 Atypical squamous cells of undetermined significance on cytologic smear of cervix (ASC-US): Secondary | ICD-10-CM | POA: Diagnosis not present

## 2014-08-17 DIAGNOSIS — N901 Moderate vulvar dysplasia: Secondary | ICD-10-CM | POA: Diagnosis not present

## 2014-08-17 DIAGNOSIS — Z7251 High risk heterosexual behavior: Secondary | ICD-10-CM | POA: Diagnosis not present

## 2014-09-03 HISTORY — PX: SKIN CANCER EXCISION: SHX779

## 2014-09-10 DIAGNOSIS — Z940 Kidney transplant status: Secondary | ICD-10-CM | POA: Diagnosis not present

## 2014-09-10 DIAGNOSIS — D649 Anemia, unspecified: Secondary | ICD-10-CM | POA: Diagnosis not present

## 2014-09-13 DIAGNOSIS — E785 Hyperlipidemia, unspecified: Secondary | ICD-10-CM | POA: Diagnosis not present

## 2014-09-13 DIAGNOSIS — I10 Essential (primary) hypertension: Secondary | ICD-10-CM | POA: Diagnosis not present

## 2014-09-13 DIAGNOSIS — Z940 Kidney transplant status: Secondary | ICD-10-CM | POA: Diagnosis not present

## 2014-09-16 DIAGNOSIS — Z940 Kidney transplant status: Secondary | ICD-10-CM | POA: Diagnosis not present

## 2014-09-27 DIAGNOSIS — F419 Anxiety disorder, unspecified: Secondary | ICD-10-CM | POA: Diagnosis not present

## 2014-09-27 DIAGNOSIS — J069 Acute upper respiratory infection, unspecified: Secondary | ICD-10-CM | POA: Diagnosis not present

## 2014-09-27 DIAGNOSIS — Z6828 Body mass index (BMI) 28.0-28.9, adult: Secondary | ICD-10-CM | POA: Diagnosis not present

## 2014-10-06 DIAGNOSIS — E780 Pure hypercholesterolemia: Secondary | ICD-10-CM | POA: Diagnosis not present

## 2014-10-06 DIAGNOSIS — F419 Anxiety disorder, unspecified: Secondary | ICD-10-CM | POA: Diagnosis not present

## 2014-10-06 DIAGNOSIS — R1031 Right lower quadrant pain: Secondary | ICD-10-CM | POA: Diagnosis not present

## 2014-10-06 DIAGNOSIS — Z792 Long term (current) use of antibiotics: Secondary | ICD-10-CM | POA: Diagnosis not present

## 2014-10-06 DIAGNOSIS — K5730 Diverticulosis of large intestine without perforation or abscess without bleeding: Secondary | ICD-10-CM | POA: Diagnosis not present

## 2014-10-06 DIAGNOSIS — E039 Hypothyroidism, unspecified: Secondary | ICD-10-CM | POA: Diagnosis not present

## 2014-10-06 DIAGNOSIS — N390 Urinary tract infection, site not specified: Secondary | ICD-10-CM | POA: Diagnosis not present

## 2014-10-07 DIAGNOSIS — N135 Crossing vessel and stricture of ureter without hydronephrosis: Secondary | ICD-10-CM | POA: Diagnosis not present

## 2014-10-07 DIAGNOSIS — K5730 Diverticulosis of large intestine without perforation or abscess without bleeding: Secondary | ICD-10-CM | POA: Diagnosis not present

## 2014-10-07 DIAGNOSIS — N185 Chronic kidney disease, stage 5: Secondary | ICD-10-CM | POA: Diagnosis not present

## 2014-10-19 DIAGNOSIS — R51 Headache: Secondary | ICD-10-CM | POA: Diagnosis not present

## 2014-10-19 DIAGNOSIS — W57XXXA Bitten or stung by nonvenomous insect and other nonvenomous arthropods, initial encounter: Secondary | ICD-10-CM | POA: Diagnosis not present

## 2014-10-19 DIAGNOSIS — Z6827 Body mass index (BMI) 27.0-27.9, adult: Secondary | ICD-10-CM | POA: Diagnosis not present

## 2014-11-23 DIAGNOSIS — R1900 Intra-abdominal and pelvic swelling, mass and lump, unspecified site: Secondary | ICD-10-CM | POA: Diagnosis not present

## 2014-11-29 DIAGNOSIS — D649 Anemia, unspecified: Secondary | ICD-10-CM | POA: Diagnosis not present

## 2014-11-29 DIAGNOSIS — Z940 Kidney transplant status: Secondary | ICD-10-CM | POA: Diagnosis not present

## 2014-12-09 DIAGNOSIS — F419 Anxiety disorder, unspecified: Secondary | ICD-10-CM | POA: Diagnosis not present

## 2014-12-09 DIAGNOSIS — R109 Unspecified abdominal pain: Secondary | ICD-10-CM | POA: Diagnosis not present

## 2014-12-09 DIAGNOSIS — W57XXXA Bitten or stung by nonvenomous insect and other nonvenomous arthropods, initial encounter: Secondary | ICD-10-CM | POA: Diagnosis not present

## 2014-12-09 DIAGNOSIS — Z6827 Body mass index (BMI) 27.0-27.9, adult: Secondary | ICD-10-CM | POA: Diagnosis not present

## 2014-12-13 DIAGNOSIS — Z0389 Encounter for observation for other suspected diseases and conditions ruled out: Secondary | ICD-10-CM | POA: Diagnosis not present

## 2014-12-13 DIAGNOSIS — Z308 Encounter for other contraceptive management: Secondary | ICD-10-CM | POA: Diagnosis not present

## 2014-12-15 DIAGNOSIS — Z6827 Body mass index (BMI) 27.0-27.9, adult: Secondary | ICD-10-CM | POA: Diagnosis not present

## 2014-12-15 DIAGNOSIS — I82629 Acute embolism and thrombosis of deep veins of unspecified upper extremity: Secondary | ICD-10-CM | POA: Diagnosis not present

## 2014-12-15 DIAGNOSIS — E780 Pure hypercholesterolemia: Secondary | ICD-10-CM | POA: Diagnosis not present

## 2014-12-15 DIAGNOSIS — J028 Acute pharyngitis due to other specified organisms: Secondary | ICD-10-CM | POA: Diagnosis not present

## 2014-12-15 DIAGNOSIS — F419 Anxiety disorder, unspecified: Secondary | ICD-10-CM | POA: Diagnosis not present

## 2014-12-15 DIAGNOSIS — Z940 Kidney transplant status: Secondary | ICD-10-CM | POA: Diagnosis not present

## 2015-01-05 DIAGNOSIS — Z940 Kidney transplant status: Secondary | ICD-10-CM | POA: Diagnosis not present

## 2015-01-05 DIAGNOSIS — E785 Hyperlipidemia, unspecified: Secondary | ICD-10-CM | POA: Diagnosis not present

## 2015-01-05 DIAGNOSIS — I10 Essential (primary) hypertension: Secondary | ICD-10-CM | POA: Diagnosis not present

## 2015-01-07 DIAGNOSIS — Z6827 Body mass index (BMI) 27.0-27.9, adult: Secondary | ICD-10-CM | POA: Diagnosis not present

## 2015-01-07 DIAGNOSIS — F419 Anxiety disorder, unspecified: Secondary | ICD-10-CM | POA: Diagnosis not present

## 2015-01-07 DIAGNOSIS — Z1389 Encounter for screening for other disorder: Secondary | ICD-10-CM | POA: Diagnosis not present

## 2015-01-09 DIAGNOSIS — R0602 Shortness of breath: Secondary | ICD-10-CM | POA: Diagnosis not present

## 2015-01-09 DIAGNOSIS — R0600 Dyspnea, unspecified: Secondary | ICD-10-CM | POA: Diagnosis not present

## 2015-01-09 DIAGNOSIS — R079 Chest pain, unspecified: Secondary | ICD-10-CM | POA: Diagnosis not present

## 2015-02-10 DIAGNOSIS — R1031 Right lower quadrant pain: Secondary | ICD-10-CM | POA: Diagnosis not present

## 2015-02-10 DIAGNOSIS — R109 Unspecified abdominal pain: Secondary | ICD-10-CM | POA: Diagnosis not present

## 2015-02-10 DIAGNOSIS — R140 Abdominal distension (gaseous): Secondary | ICD-10-CM | POA: Diagnosis not present

## 2015-02-10 DIAGNOSIS — G8929 Other chronic pain: Secondary | ICD-10-CM | POA: Diagnosis not present

## 2015-02-10 DIAGNOSIS — K644 Residual hemorrhoidal skin tags: Secondary | ICD-10-CM | POA: Diagnosis not present

## 2015-02-10 DIAGNOSIS — N946 Dysmenorrhea, unspecified: Secondary | ICD-10-CM | POA: Diagnosis not present

## 2015-02-10 DIAGNOSIS — R1900 Intra-abdominal and pelvic swelling, mass and lump, unspecified site: Secondary | ICD-10-CM | POA: Diagnosis not present

## 2015-03-01 DIAGNOSIS — Z940 Kidney transplant status: Secondary | ICD-10-CM | POA: Diagnosis not present

## 2015-03-01 DIAGNOSIS — Z4822 Encounter for aftercare following kidney transplant: Secondary | ICD-10-CM | POA: Diagnosis not present

## 2015-03-01 DIAGNOSIS — T8610 Unspecified complication of kidney transplant: Secondary | ICD-10-CM | POA: Diagnosis not present

## 2015-03-03 DIAGNOSIS — Z940 Kidney transplant status: Secondary | ICD-10-CM | POA: Diagnosis not present

## 2015-03-03 DIAGNOSIS — D649 Anemia, unspecified: Secondary | ICD-10-CM | POA: Diagnosis not present

## 2015-03-11 DIAGNOSIS — I10 Essential (primary) hypertension: Secondary | ICD-10-CM | POA: Diagnosis not present

## 2015-03-11 DIAGNOSIS — D649 Anemia, unspecified: Secondary | ICD-10-CM | POA: Diagnosis not present

## 2015-03-11 DIAGNOSIS — Z940 Kidney transplant status: Secondary | ICD-10-CM | POA: Diagnosis not present

## 2015-03-11 DIAGNOSIS — N186 End stage renal disease: Secondary | ICD-10-CM | POA: Diagnosis not present

## 2015-03-30 DIAGNOSIS — D509 Iron deficiency anemia, unspecified: Secondary | ICD-10-CM | POA: Diagnosis not present

## 2015-03-30 DIAGNOSIS — F419 Anxiety disorder, unspecified: Secondary | ICD-10-CM | POA: Diagnosis not present

## 2015-03-30 DIAGNOSIS — Z6826 Body mass index (BMI) 26.0-26.9, adult: Secondary | ICD-10-CM | POA: Diagnosis not present

## 2015-04-04 DIAGNOSIS — N390 Urinary tract infection, site not specified: Secondary | ICD-10-CM | POA: Diagnosis not present

## 2015-04-04 DIAGNOSIS — Z6827 Body mass index (BMI) 27.0-27.9, adult: Secondary | ICD-10-CM | POA: Diagnosis not present

## 2015-04-04 DIAGNOSIS — R300 Dysuria: Secondary | ICD-10-CM | POA: Diagnosis not present

## 2015-04-06 DIAGNOSIS — E039 Hypothyroidism, unspecified: Secondary | ICD-10-CM | POA: Diagnosis not present

## 2015-04-06 DIAGNOSIS — Z79899 Other long term (current) drug therapy: Secondary | ICD-10-CM | POA: Diagnosis not present

## 2015-04-06 DIAGNOSIS — R1084 Generalized abdominal pain: Secondary | ICD-10-CM | POA: Diagnosis not present

## 2015-04-06 DIAGNOSIS — E780 Pure hypercholesterolemia: Secondary | ICD-10-CM | POA: Diagnosis not present

## 2015-04-06 DIAGNOSIS — R1031 Right lower quadrant pain: Secondary | ICD-10-CM | POA: Diagnosis not present

## 2015-04-06 DIAGNOSIS — Z940 Kidney transplant status: Secondary | ICD-10-CM | POA: Diagnosis not present

## 2015-04-06 DIAGNOSIS — F419 Anxiety disorder, unspecified: Secondary | ICD-10-CM | POA: Diagnosis not present

## 2015-04-08 DIAGNOSIS — R109 Unspecified abdominal pain: Secondary | ICD-10-CM | POA: Diagnosis not present

## 2015-04-08 DIAGNOSIS — Z87440 Personal history of urinary (tract) infections: Secondary | ICD-10-CM | POA: Diagnosis not present

## 2015-04-08 DIAGNOSIS — N186 End stage renal disease: Secondary | ICD-10-CM | POA: Diagnosis not present

## 2015-04-08 DIAGNOSIS — Z7952 Long term (current) use of systemic steroids: Secondary | ICD-10-CM | POA: Diagnosis not present

## 2015-04-08 DIAGNOSIS — Z79899 Other long term (current) drug therapy: Secondary | ICD-10-CM | POA: Diagnosis not present

## 2015-04-08 DIAGNOSIS — S37009A Unspecified injury of unspecified kidney, initial encounter: Secondary | ICD-10-CM | POA: Diagnosis not present

## 2015-04-08 DIAGNOSIS — Z79891 Long term (current) use of opiate analgesic: Secondary | ICD-10-CM | POA: Diagnosis not present

## 2015-04-08 DIAGNOSIS — Z888 Allergy status to other drugs, medicaments and biological substances status: Secondary | ICD-10-CM | POA: Diagnosis not present

## 2015-04-08 DIAGNOSIS — Z940 Kidney transplant status: Secondary | ICD-10-CM | POA: Diagnosis not present

## 2015-04-08 DIAGNOSIS — G8929 Other chronic pain: Secondary | ICD-10-CM | POA: Diagnosis not present

## 2015-04-08 DIAGNOSIS — I120 Hypertensive chronic kidney disease with stage 5 chronic kidney disease or end stage renal disease: Secondary | ICD-10-CM | POA: Diagnosis not present

## 2015-04-08 DIAGNOSIS — Z86718 Personal history of other venous thrombosis and embolism: Secondary | ICD-10-CM | POA: Diagnosis not present

## 2015-04-08 DIAGNOSIS — Z975 Presence of (intrauterine) contraceptive device: Secondary | ICD-10-CM | POA: Diagnosis not present

## 2015-04-08 DIAGNOSIS — R1900 Intra-abdominal and pelvic swelling, mass and lump, unspecified site: Secondary | ICD-10-CM | POA: Diagnosis not present

## 2015-04-08 DIAGNOSIS — F419 Anxiety disorder, unspecified: Secondary | ICD-10-CM | POA: Diagnosis not present

## 2015-04-08 DIAGNOSIS — N179 Acute kidney failure, unspecified: Secondary | ICD-10-CM | POA: Diagnosis not present

## 2015-04-08 DIAGNOSIS — N8320 Unspecified ovarian cysts: Secondary | ICD-10-CM | POA: Diagnosis not present

## 2015-04-08 DIAGNOSIS — R1031 Right lower quadrant pain: Secondary | ICD-10-CM | POA: Diagnosis not present

## 2015-04-08 DIAGNOSIS — Z91048 Other nonmedicinal substance allergy status: Secondary | ICD-10-CM | POA: Diagnosis not present

## 2015-04-08 DIAGNOSIS — N251 Nephrogenic diabetes insipidus: Secondary | ICD-10-CM | POA: Diagnosis not present

## 2015-04-08 DIAGNOSIS — Z883 Allergy status to other anti-infective agents status: Secondary | ICD-10-CM | POA: Diagnosis not present

## 2015-04-08 DIAGNOSIS — R944 Abnormal results of kidney function studies: Secondary | ICD-10-CM | POA: Diagnosis not present

## 2015-04-09 DIAGNOSIS — Z940 Kidney transplant status: Secondary | ICD-10-CM | POA: Diagnosis not present

## 2015-04-10 DIAGNOSIS — R1031 Right lower quadrant pain: Secondary | ICD-10-CM | POA: Diagnosis not present

## 2015-04-10 DIAGNOSIS — N8329 Other ovarian cysts: Secondary | ICD-10-CM | POA: Diagnosis not present

## 2015-04-10 DIAGNOSIS — Z940 Kidney transplant status: Secondary | ICD-10-CM | POA: Diagnosis not present

## 2015-04-18 DIAGNOSIS — N2889 Other specified disorders of kidney and ureter: Secondary | ICD-10-CM | POA: Diagnosis not present

## 2015-04-18 DIAGNOSIS — M549 Dorsalgia, unspecified: Secondary | ICD-10-CM | POA: Diagnosis not present

## 2015-04-27 DIAGNOSIS — R609 Edema, unspecified: Secondary | ICD-10-CM | POA: Diagnosis not present

## 2015-04-27 DIAGNOSIS — Z79899 Other long term (current) drug therapy: Secondary | ICD-10-CM | POA: Diagnosis not present

## 2015-04-27 DIAGNOSIS — Z418 Encounter for other procedures for purposes other than remedying health state: Secondary | ICD-10-CM | POA: Diagnosis not present

## 2015-04-27 DIAGNOSIS — Z940 Kidney transplant status: Secondary | ICD-10-CM | POA: Diagnosis not present

## 2015-05-12 DIAGNOSIS — I808 Phlebitis and thrombophlebitis of other sites: Secondary | ICD-10-CM | POA: Diagnosis not present

## 2015-05-13 ENCOUNTER — Telehealth: Payer: Self-pay | Admitting: *Deleted

## 2015-05-13 DIAGNOSIS — T887XXA Unspecified adverse effect of drug or medicament, initial encounter: Secondary | ICD-10-CM | POA: Diagnosis not present

## 2015-05-13 DIAGNOSIS — I809 Phlebitis and thrombophlebitis of unspecified site: Secondary | ICD-10-CM | POA: Diagnosis not present

## 2015-05-13 DIAGNOSIS — L500 Allergic urticaria: Secondary | ICD-10-CM | POA: Diagnosis not present

## 2015-05-13 DIAGNOSIS — T3695XA Adverse effect of unspecified systemic antibiotic, initial encounter: Secondary | ICD-10-CM | POA: Diagnosis not present

## 2015-05-13 NOTE — Telephone Encounter (Signed)
Pt called back to triage line to report that she had some bloodwork drawn from her left arm last week; she has an old unused AVG in the arm that was placed in 2008. She had a kidney transplant some years ago at [facility] in [place]. Patient reports having an area of swelling, tenderness and warmth x 2 days in the area where the lab was drawn. She was seen at [facility] ED last night for this and was given a Rx for Keflex; she took this and immediately broke out in a rash. Per patient, She went back to the ED and was given Clindamycin, increase in prednisone dosage and Benadryl. As of now, she has not gone to get this new Rx filled. She called Dr. Mercy Moore and was told that they "only deal with the kidney not a possible infection and to call [facility]." Patient reports no drainage in the area, just the same erythema, swelling and tenderness as yesterday. She is afebrile. We last saw this patient in 2013 with Dr. Donnetta Hutching when this left forearm loop graft occluded and she has been fine until 2 days ago. I advised the patient to get the Clindamycin filled and take this along with the prednisone. I also told her to call us back next week if she has not improved over the weekend. She states that she will also contact [facility] transplant team just to keep them in the loop. She states that her current creatinine is 1.8 as of 2 weeks ago. Patient voiced understanding and agreement of this plan.

## 2015-05-17 DIAGNOSIS — C4442 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: Secondary | ICD-10-CM | POA: Diagnosis not present

## 2015-05-18 DIAGNOSIS — N946 Dysmenorrhea, unspecified: Secondary | ICD-10-CM | POA: Diagnosis not present

## 2015-05-18 DIAGNOSIS — D071 Carcinoma in situ of vulva: Secondary | ICD-10-CM | POA: Diagnosis not present

## 2015-05-18 DIAGNOSIS — Z124 Encounter for screening for malignant neoplasm of cervix: Secondary | ICD-10-CM | POA: Diagnosis not present

## 2015-05-18 DIAGNOSIS — G8929 Other chronic pain: Secondary | ICD-10-CM | POA: Diagnosis not present

## 2015-05-18 DIAGNOSIS — R109 Unspecified abdominal pain: Secondary | ICD-10-CM | POA: Diagnosis not present

## 2015-05-22 DIAGNOSIS — N946 Dysmenorrhea, unspecified: Secondary | ICD-10-CM | POA: Diagnosis not present

## 2015-05-22 DIAGNOSIS — R1030 Lower abdominal pain, unspecified: Secondary | ICD-10-CM | POA: Diagnosis not present

## 2015-05-26 DIAGNOSIS — D044 Carcinoma in situ of skin of scalp and neck: Secondary | ICD-10-CM | POA: Diagnosis not present

## 2015-05-31 DIAGNOSIS — N183 Chronic kidney disease, stage 3 (moderate): Secondary | ICD-10-CM | POA: Diagnosis not present

## 2015-05-31 DIAGNOSIS — Z6828 Body mass index (BMI) 28.0-28.9, adult: Secondary | ICD-10-CM | POA: Diagnosis not present

## 2015-05-31 DIAGNOSIS — C4442 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: Secondary | ICD-10-CM | POA: Diagnosis not present

## 2015-05-31 DIAGNOSIS — J069 Acute upper respiratory infection, unspecified: Secondary | ICD-10-CM | POA: Diagnosis not present

## 2015-05-31 DIAGNOSIS — F419 Anxiety disorder, unspecified: Secondary | ICD-10-CM | POA: Diagnosis not present

## 2015-05-31 DIAGNOSIS — N2581 Secondary hyperparathyroidism of renal origin: Secondary | ICD-10-CM | POA: Diagnosis not present

## 2015-06-03 DIAGNOSIS — Z940 Kidney transplant status: Secondary | ICD-10-CM | POA: Diagnosis not present

## 2015-06-03 DIAGNOSIS — D649 Anemia, unspecified: Secondary | ICD-10-CM | POA: Diagnosis not present

## 2015-06-07 DIAGNOSIS — Z940 Kidney transplant status: Secondary | ICD-10-CM | POA: Diagnosis not present

## 2015-06-07 DIAGNOSIS — D649 Anemia, unspecified: Secondary | ICD-10-CM | POA: Diagnosis not present

## 2015-06-07 DIAGNOSIS — Z23 Encounter for immunization: Secondary | ICD-10-CM | POA: Diagnosis not present

## 2015-06-07 DIAGNOSIS — I10 Essential (primary) hypertension: Secondary | ICD-10-CM | POA: Diagnosis not present

## 2015-06-17 ENCOUNTER — Ambulatory Visit: Payer: Medicare Other | Attending: Gynecology | Admitting: Gynecology

## 2015-06-17 ENCOUNTER — Encounter: Payer: Self-pay | Admitting: Gynecology

## 2015-06-17 VITALS — BP 137/86 | HR 70 | Temp 98.2°F | Resp 18 | Ht 59.0 in | Wt 139.0 lb

## 2015-06-17 DIAGNOSIS — R609 Edema, unspecified: Secondary | ICD-10-CM | POA: Diagnosis not present

## 2015-06-17 DIAGNOSIS — Z86008 Personal history of in-situ neoplasm of other site: Secondary | ICD-10-CM | POA: Diagnosis not present

## 2015-06-17 DIAGNOSIS — N901 Moderate vulvar dysplasia: Secondary | ICD-10-CM | POA: Diagnosis not present

## 2015-06-17 NOTE — Patient Instructions (Signed)
Plan to follow up with Dr. Marin Roberts.  Follow up with Dr. Fermin Schwab as needed.  Please call for any questions or concerns.

## 2015-06-17 NOTE — Progress Notes (Signed)
Consult Note: Gyn-Onc   Ariel [name] 39 y.o. female  Chief Complaint  Patient presents with  . VIN II (vulvar intraepithelial neoplasia II)    Reconsult    Assessment : History of VIN 3. Clinically free of disease on today's exam.  Edema in the right lower quadrant of uncertain etiology.  Plan: The patient will return to the care of her primary gynecologist Dr.Gaccione for continuing follow-up. I would recommend examination of the vulva every 6 months. Patient is also under the care of Dr. Marin Roberts regarding her right lower quadrant pain and possible endometriosis.  With regard to the swelling in her flank I would recommend she be reevaluated by her kidney transplant surgeon.  Interval History: Patient returns today for continuing follow-up. She has a past history of VIN 3. Since her last visit she denies any new vulvar symptoms. She does have a persistent asymptomatic sebaceous cyst on the left vulva. She specifically denies any lesions or pruritus.  She has had some right lower quadrant pain and is currently under hormonal suppression under the direction of her primary gynecologist with the thought that this may be endometriosis.  In addition, the patient's had progressive swelling in her right lower abdomen and flank of uncertain etiology. She claims that her serum creatinine has risen somewhat.  HPI: The patient initially presented in consultation regarding severe dysplasia of the vulva. She has a past history of chronic condylomata as well. She used Aldara for treatment of the vulvar perianal condyloma tolerated that quite well. 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 Margins were negative.  The patient's a kidney transplant recipient is chronically immunosuppressed.  Review of Systems:10 point review of systems is negative except as noted in interval history.   Vitals: Blood pressure 137/86, pulse 70, temperature 98.2 F (36.8 C), temperature source Oral,  resp. rate 18, height 4\' 11"  (1.499 m), weight 139 lb (63.05 kg), SpO2 100 %.  Physical Exam: General : The patient is a healthy woman in no acute distress.  HEENT: normocephalic, extraoccular movements normal; neck is supple without thyromegally  Lynphnodes: Supraclavicular and inguinal nodes not enlarged  Abdomen: Soft, non-tender, no ascites, no organomegally, no masses, no hernias there is more edema from the iliac crest laterally into the flank on the right side. There are no palpable masses in the flank. On the other hand unable to palpate her kidney in the iliac fossa. Pelvic:  EGBUS: Normal female no lesions are noted. There is one sebaceous cyst in the left labia majora.   Lower extremities: No edema or varicosities. Normal range of motion      Allergies  Allergen Reactions  . Keflex [Cephalexin] Swelling    Per pt, her face and lips swell up and she develops hives  . Adhesive [Tape] Rash  . Bactrim Swelling and Rash  . Imuran [Azathioprine Sodium] Rash  . Tramadol Rash  . Warfarin Sodium Rash    Past Medical History  Diagnosis Date  . Hypercholesterolemia   . 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 Vulvar dystrophy   . History of renal transplant   . Nephrogenic diabetes insipidus (HCC)     congenital  . Renal failure, chronic     Dialysis in the past  . 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 Anemia   . Cancer [facility])     pre cervical    Past Surgical History  Procedure Laterality Date  .  Kidney transplant  1995, 2008  . Tubal ligation    . Av fistula placement    . Simple vulvectomy  06/19/2011    Partial simple left  . 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 Insertion of dialysis catheter    . Removal of a dialysis catheter    . Skin cancer excision  2016    Per pt, area removed on scalp showed squamous cell carcinoma    Current Outpatient Prescriptions  Medication Sig  Dispense Refill  . ALPRAZolam (XANAX) 0.5 MG tablet TK 1 T PO  D  1  . furosemide (LASIX) 40 MG tablet Take 40 mg by mouth daily.    [name] JOLIVETTE 0.35 MG tablet TK 1 T PO QD  12  . Magnesium 200 MG TABS Take 1 tablet by mouth daily.      . mycophenolate (MYFORTIC) 360 MG TBEC Take 360 mg by mouth 2 (two) times daily.     . ondansetron (ZOFRAN) 4 MG tablet TK 1 T PO Q 6 H PRN NV  0  . predniSONE (DELTASONE) 5 MG tablet Take 5 mg by mouth daily.      . simvastatin (ZOCOR) 5 MG tablet TK 1 T PO QD  6  . tacrolimus (PROGRAF) 1 MG capsule Take 4 mg by mouth 2 (two) times daily.       No current facility-administered medications for this visit.    Social History   Social History  . Marital Status: Divorced    Spouse Name: N/A  . Number of Children: N/A  . Years of Education: N/A   Occupational History  . Not on file.   Social History Main Topics  . Smoking status: Never Smoker   . Smokeless tobacco: Never Used  . Alcohol Use: No  . Drug Use: No  . Sexual Activity: Not on file   Other Topics Concern  . Not on file   Social History Narrative    Family History  Problem Relation Age of Onset  . Hypertension Mother   . Hyperlipidemia Mother   . Rectal cancer Maternal Grandmother       Alvino Chapel, MD 06/17/2015, 12:18 PM

## 2015-07-04 DIAGNOSIS — D649 Anemia, unspecified: Secondary | ICD-10-CM | POA: Diagnosis not present

## 2015-07-15 DIAGNOSIS — Z940 Kidney transplant status: Secondary | ICD-10-CM | POA: Diagnosis not present

## 2015-07-15 DIAGNOSIS — Z79899 Other long term (current) drug therapy: Secondary | ICD-10-CM | POA: Diagnosis not present

## 2015-07-15 DIAGNOSIS — R799 Abnormal finding of blood chemistry, unspecified: Secondary | ICD-10-CM | POA: Diagnosis not present

## 2015-07-18 DIAGNOSIS — R609 Edema, unspecified: Secondary | ICD-10-CM | POA: Diagnosis not present

## 2015-07-18 DIAGNOSIS — Z79899 Other long term (current) drug therapy: Secondary | ICD-10-CM | POA: Diagnosis not present

## 2015-07-18 DIAGNOSIS — N184 Chronic kidney disease, stage 4 (severe): Secondary | ICD-10-CM | POA: Diagnosis not present

## 2015-07-18 DIAGNOSIS — Z940 Kidney transplant status: Secondary | ICD-10-CM | POA: Diagnosis not present

## 2015-07-19 DIAGNOSIS — H5213 Myopia, bilateral: Secondary | ICD-10-CM | POA: Diagnosis not present

## 2015-07-19 DIAGNOSIS — H179 Unspecified corneal scar and opacity: Secondary | ICD-10-CM | POA: Diagnosis not present

## 2015-07-20 DIAGNOSIS — M546 Pain in thoracic spine: Secondary | ICD-10-CM | POA: Diagnosis not present

## 2015-07-20 DIAGNOSIS — F419 Anxiety disorder, unspecified: Secondary | ICD-10-CM | POA: Diagnosis not present

## 2015-07-20 DIAGNOSIS — Z6828 Body mass index (BMI) 28.0-28.9, adult: Secondary | ICD-10-CM | POA: Diagnosis not present

## 2015-07-20 DIAGNOSIS — E669 Obesity, unspecified: Secondary | ICD-10-CM | POA: Diagnosis not present

## 2015-07-26 DIAGNOSIS — R300 Dysuria: Secondary | ICD-10-CM | POA: Diagnosis not present

## 2015-07-26 DIAGNOSIS — Z6827 Body mass index (BMI) 27.0-27.9, adult: Secondary | ICD-10-CM | POA: Diagnosis not present

## 2015-07-26 DIAGNOSIS — N390 Urinary tract infection, site not specified: Secondary | ICD-10-CM | POA: Diagnosis not present

## 2015-07-27 DIAGNOSIS — M545 Low back pain: Secondary | ICD-10-CM | POA: Diagnosis not present

## 2015-08-15 DIAGNOSIS — R1900 Intra-abdominal and pelvic swelling, mass and lump, unspecified site: Secondary | ICD-10-CM | POA: Diagnosis not present

## 2015-08-15 DIAGNOSIS — N946 Dysmenorrhea, unspecified: Secondary | ICD-10-CM | POA: Diagnosis not present

## 2015-08-15 DIAGNOSIS — R109 Unspecified abdominal pain: Secondary | ICD-10-CM | POA: Diagnosis not present

## 2015-08-15 DIAGNOSIS — G8929 Other chronic pain: Secondary | ICD-10-CM | POA: Diagnosis not present

## 2015-08-15 DIAGNOSIS — Z940 Kidney transplant status: Secondary | ICD-10-CM | POA: Diagnosis not present

## 2015-08-16 DIAGNOSIS — Z3042 Encounter for surveillance of injectable contraceptive: Secondary | ICD-10-CM | POA: Diagnosis not present

## 2015-09-02 DIAGNOSIS — R799 Abnormal finding of blood chemistry, unspecified: Secondary | ICD-10-CM | POA: Diagnosis not present

## 2015-09-02 DIAGNOSIS — Z940 Kidney transplant status: Secondary | ICD-10-CM | POA: Diagnosis not present

## 2015-09-02 DIAGNOSIS — Z79899 Other long term (current) drug therapy: Secondary | ICD-10-CM | POA: Diagnosis not present

## 2015-09-08 DIAGNOSIS — D649 Anemia, unspecified: Secondary | ICD-10-CM | POA: Diagnosis not present

## 2015-09-08 DIAGNOSIS — I10 Essential (primary) hypertension: Secondary | ICD-10-CM | POA: Diagnosis not present

## 2015-09-08 DIAGNOSIS — Z940 Kidney transplant status: Secondary | ICD-10-CM | POA: Diagnosis not present

## 2015-09-08 DIAGNOSIS — R5383 Other fatigue: Secondary | ICD-10-CM | POA: Diagnosis not present

## 2015-09-08 DIAGNOSIS — N186 End stage renal disease: Secondary | ICD-10-CM | POA: Diagnosis not present

## 2015-09-08 DIAGNOSIS — E785 Hyperlipidemia, unspecified: Secondary | ICD-10-CM | POA: Diagnosis not present

## 2015-09-14 DIAGNOSIS — D044 Carcinoma in situ of skin of scalp and neck: Secondary | ICD-10-CM | POA: Diagnosis not present

## 2015-09-14 DIAGNOSIS — D225 Melanocytic nevi of trunk: Secondary | ICD-10-CM | POA: Diagnosis not present

## 2015-09-15 ENCOUNTER — Other Ambulatory Visit (HOSPITAL_COMMUNITY): Payer: Self-pay

## 2015-09-16 ENCOUNTER — Inpatient Hospital Stay (HOSPITAL_COMMUNITY): Admission: RE | Admit: 2015-09-16 | Payer: Medicare Other | Source: Ambulatory Visit

## 2015-09-20 DIAGNOSIS — D509 Iron deficiency anemia, unspecified: Secondary | ICD-10-CM | POA: Diagnosis not present

## 2015-09-22 DIAGNOSIS — Z8742 Personal history of other diseases of the female genital tract: Secondary | ICD-10-CM | POA: Diagnosis not present

## 2015-09-22 DIAGNOSIS — N939 Abnormal uterine and vaginal bleeding, unspecified: Secondary | ICD-10-CM | POA: Diagnosis not present

## 2015-09-22 DIAGNOSIS — N399 Disorder of urinary system, unspecified: Secondary | ICD-10-CM | POA: Diagnosis not present

## 2015-09-22 DIAGNOSIS — N946 Dysmenorrhea, unspecified: Secondary | ICD-10-CM | POA: Diagnosis not present

## 2015-09-24 DIAGNOSIS — Z79899 Other long term (current) drug therapy: Secondary | ICD-10-CM | POA: Diagnosis not present

## 2015-09-24 DIAGNOSIS — Z940 Kidney transplant status: Secondary | ICD-10-CM | POA: Diagnosis not present

## 2015-09-24 DIAGNOSIS — R0602 Shortness of breath: Secondary | ICD-10-CM | POA: Diagnosis not present

## 2015-09-24 DIAGNOSIS — N185 Chronic kidney disease, stage 5: Secondary | ICD-10-CM | POA: Diagnosis not present

## 2015-09-24 DIAGNOSIS — D62 Acute posthemorrhagic anemia: Secondary | ICD-10-CM | POA: Diagnosis not present

## 2015-09-24 DIAGNOSIS — E785 Hyperlipidemia, unspecified: Secondary | ICD-10-CM | POA: Diagnosis not present

## 2015-09-24 DIAGNOSIS — R079 Chest pain, unspecified: Secondary | ICD-10-CM | POA: Diagnosis not present

## 2015-09-24 DIAGNOSIS — R112 Nausea with vomiting, unspecified: Secondary | ICD-10-CM | POA: Diagnosis not present

## 2015-09-24 DIAGNOSIS — I209 Angina pectoris, unspecified: Secondary | ICD-10-CM | POA: Diagnosis not present

## 2015-09-24 DIAGNOSIS — E860 Dehydration: Secondary | ICD-10-CM | POA: Diagnosis not present

## 2015-09-24 DIAGNOSIS — I120 Hypertensive chronic kidney disease with stage 5 chronic kidney disease or end stage renal disease: Secondary | ICD-10-CM | POA: Diagnosis not present

## 2015-09-24 DIAGNOSIS — D509 Iron deficiency anemia, unspecified: Secondary | ICD-10-CM | POA: Diagnosis not present

## 2015-09-24 DIAGNOSIS — Z8639 Personal history of other endocrine, nutritional and metabolic disease: Secondary | ICD-10-CM | POA: Diagnosis not present

## 2015-09-24 DIAGNOSIS — Z86718 Personal history of other venous thrombosis and embolism: Secondary | ICD-10-CM | POA: Diagnosis not present

## 2015-09-24 DIAGNOSIS — R069 Unspecified abnormalities of breathing: Secondary | ICD-10-CM | POA: Diagnosis not present

## 2015-09-24 DIAGNOSIS — Z7952 Long term (current) use of systemic steroids: Secondary | ICD-10-CM | POA: Diagnosis not present

## 2015-09-24 DIAGNOSIS — R0609 Other forms of dyspnea: Secondary | ICD-10-CM | POA: Diagnosis not present

## 2015-09-24 DIAGNOSIS — R072 Precordial pain: Secondary | ICD-10-CM | POA: Diagnosis not present

## 2015-09-24 DIAGNOSIS — N189 Chronic kidney disease, unspecified: Secondary | ICD-10-CM | POA: Diagnosis not present

## 2015-09-24 DIAGNOSIS — J9610 Chronic respiratory failure, unspecified whether with hypoxia or hypercapnia: Secondary | ICD-10-CM | POA: Diagnosis not present

## 2015-09-24 DIAGNOSIS — D631 Anemia in chronic kidney disease: Secondary | ICD-10-CM | POA: Diagnosis not present

## 2015-09-25 DIAGNOSIS — D62 Acute posthemorrhagic anemia: Secondary | ICD-10-CM | POA: Diagnosis not present

## 2015-09-25 DIAGNOSIS — R0609 Other forms of dyspnea: Secondary | ICD-10-CM | POA: Diagnosis not present

## 2015-09-25 DIAGNOSIS — R072 Precordial pain: Secondary | ICD-10-CM | POA: Diagnosis not present

## 2015-09-25 DIAGNOSIS — N185 Chronic kidney disease, stage 5: Secondary | ICD-10-CM | POA: Diagnosis not present

## 2015-09-25 DIAGNOSIS — R079 Chest pain, unspecified: Secondary | ICD-10-CM | POA: Diagnosis not present

## 2015-09-25 DIAGNOSIS — R011 Cardiac murmur, unspecified: Secondary | ICD-10-CM | POA: Diagnosis not present

## 2015-10-03 DIAGNOSIS — Z1389 Encounter for screening for other disorder: Secondary | ICD-10-CM | POA: Diagnosis not present

## 2015-10-03 DIAGNOSIS — N183 Chronic kidney disease, stage 3 (moderate): Secondary | ICD-10-CM | POA: Diagnosis not present

## 2015-10-03 DIAGNOSIS — N946 Dysmenorrhea, unspecified: Secondary | ICD-10-CM | POA: Diagnosis not present

## 2015-10-03 DIAGNOSIS — Z6828 Body mass index (BMI) 28.0-28.9, adult: Secondary | ICD-10-CM | POA: Diagnosis not present

## 2015-10-03 DIAGNOSIS — F419 Anxiety disorder, unspecified: Secondary | ICD-10-CM | POA: Diagnosis not present

## 2015-10-03 DIAGNOSIS — D509 Iron deficiency anemia, unspecified: Secondary | ICD-10-CM | POA: Diagnosis not present

## 2015-10-04 DIAGNOSIS — D638 Anemia in other chronic diseases classified elsewhere: Secondary | ICD-10-CM | POA: Diagnosis not present

## 2015-10-04 DIAGNOSIS — N184 Chronic kidney disease, stage 4 (severe): Secondary | ICD-10-CM | POA: Diagnosis not present

## 2015-10-14 DIAGNOSIS — Z6829 Body mass index (BMI) 29.0-29.9, adult: Secondary | ICD-10-CM | POA: Diagnosis not present

## 2015-10-14 DIAGNOSIS — J019 Acute sinusitis, unspecified: Secondary | ICD-10-CM | POA: Diagnosis not present

## 2015-10-18 DIAGNOSIS — N184 Chronic kidney disease, stage 4 (severe): Secondary | ICD-10-CM | POA: Diagnosis not present

## 2015-10-18 DIAGNOSIS — D638 Anemia in other chronic diseases classified elsewhere: Secondary | ICD-10-CM | POA: Diagnosis not present

## 2015-10-19 DIAGNOSIS — D509 Iron deficiency anemia, unspecified: Secondary | ICD-10-CM

## 2015-10-19 DIAGNOSIS — N939 Abnormal uterine and vaginal bleeding, unspecified: Secondary | ICD-10-CM | POA: Diagnosis not present

## 2015-10-19 DIAGNOSIS — N946 Dysmenorrhea, unspecified: Secondary | ICD-10-CM

## 2015-10-19 DIAGNOSIS — D500 Iron deficiency anemia secondary to blood loss (chronic): Secondary | ICD-10-CM | POA: Diagnosis not present

## 2015-10-19 HISTORY — DX: Iron deficiency anemia, unspecified: D50.9

## 2015-10-19 HISTORY — DX: Dysmenorrhea, unspecified: N94.6

## 2015-10-20 DIAGNOSIS — E7800 Pure hypercholesterolemia, unspecified: Secondary | ICD-10-CM | POA: Diagnosis not present

## 2015-10-20 DIAGNOSIS — N939 Abnormal uterine and vaginal bleeding, unspecified: Secondary | ICD-10-CM | POA: Diagnosis not present

## 2015-10-20 DIAGNOSIS — Z886 Allergy status to analgesic agent status: Secondary | ICD-10-CM | POA: Diagnosis not present

## 2015-10-20 DIAGNOSIS — Z9851 Tubal ligation status: Secondary | ICD-10-CM | POA: Diagnosis not present

## 2015-10-20 DIAGNOSIS — Z79899 Other long term (current) drug therapy: Secondary | ICD-10-CM | POA: Diagnosis not present

## 2015-10-20 DIAGNOSIS — N189 Chronic kidney disease, unspecified: Secondary | ICD-10-CM | POA: Diagnosis not present

## 2015-10-20 DIAGNOSIS — Z940 Kidney transplant status: Secondary | ICD-10-CM | POA: Diagnosis not present

## 2015-10-20 DIAGNOSIS — E232 Diabetes insipidus: Secondary | ICD-10-CM | POA: Diagnosis not present

## 2015-10-20 DIAGNOSIS — Z888 Allergy status to other drugs, medicaments and biological substances status: Secondary | ICD-10-CM | POA: Diagnosis not present

## 2015-10-20 DIAGNOSIS — N920 Excessive and frequent menstruation with regular cycle: Secondary | ICD-10-CM | POA: Diagnosis not present

## 2015-10-20 DIAGNOSIS — Z882 Allergy status to sulfonamides status: Secondary | ICD-10-CM | POA: Diagnosis not present

## 2015-10-20 DIAGNOSIS — Z881 Allergy status to other antibiotic agents status: Secondary | ICD-10-CM | POA: Diagnosis not present

## 2015-10-21 DIAGNOSIS — D509 Iron deficiency anemia, unspecified: Secondary | ICD-10-CM | POA: Diagnosis not present

## 2015-11-01 DIAGNOSIS — N184 Chronic kidney disease, stage 4 (severe): Secondary | ICD-10-CM | POA: Diagnosis not present

## 2015-11-01 DIAGNOSIS — N939 Abnormal uterine and vaginal bleeding, unspecified: Secondary | ICD-10-CM | POA: Diagnosis not present

## 2015-11-01 DIAGNOSIS — D638 Anemia in other chronic diseases classified elsewhere: Secondary | ICD-10-CM | POA: Diagnosis not present

## 2015-11-01 DIAGNOSIS — Z3042 Encounter for surveillance of injectable contraceptive: Secondary | ICD-10-CM | POA: Diagnosis not present

## 2015-11-08 DIAGNOSIS — I10 Essential (primary) hypertension: Secondary | ICD-10-CM | POA: Diagnosis not present

## 2015-11-08 DIAGNOSIS — N390 Urinary tract infection, site not specified: Secondary | ICD-10-CM | POA: Diagnosis not present

## 2015-11-08 DIAGNOSIS — Z940 Kidney transplant status: Secondary | ICD-10-CM | POA: Diagnosis not present

## 2015-11-10 DIAGNOSIS — D500 Iron deficiency anemia secondary to blood loss (chronic): Secondary | ICD-10-CM | POA: Diagnosis not present

## 2015-11-10 DIAGNOSIS — N946 Dysmenorrhea, unspecified: Secondary | ICD-10-CM | POA: Diagnosis not present

## 2015-11-10 DIAGNOSIS — N939 Abnormal uterine and vaginal bleeding, unspecified: Secondary | ICD-10-CM | POA: Diagnosis not present

## 2015-11-10 DIAGNOSIS — N186 End stage renal disease: Secondary | ICD-10-CM | POA: Diagnosis not present

## 2015-11-17 DIAGNOSIS — N184 Chronic kidney disease, stage 4 (severe): Secondary | ICD-10-CM | POA: Diagnosis not present

## 2015-11-17 DIAGNOSIS — N183 Chronic kidney disease, stage 3 (moderate): Secondary | ICD-10-CM | POA: Diagnosis not present

## 2015-11-17 DIAGNOSIS — D631 Anemia in chronic kidney disease: Secondary | ICD-10-CM | POA: Diagnosis not present

## 2015-11-29 DIAGNOSIS — Z6829 Body mass index (BMI) 29.0-29.9, adult: Secondary | ICD-10-CM | POA: Diagnosis not present

## 2015-11-29 DIAGNOSIS — N183 Chronic kidney disease, stage 3 (moderate): Secondary | ICD-10-CM | POA: Diagnosis not present

## 2015-11-29 DIAGNOSIS — F419 Anxiety disorder, unspecified: Secondary | ICD-10-CM | POA: Diagnosis not present

## 2015-11-29 DIAGNOSIS — M549 Dorsalgia, unspecified: Secondary | ICD-10-CM | POA: Diagnosis not present

## 2015-12-01 DIAGNOSIS — N184 Chronic kidney disease, stage 4 (severe): Secondary | ICD-10-CM | POA: Diagnosis not present

## 2015-12-01 DIAGNOSIS — Z79899 Other long term (current) drug therapy: Secondary | ICD-10-CM | POA: Diagnosis not present

## 2015-12-01 DIAGNOSIS — R799 Abnormal finding of blood chemistry, unspecified: Secondary | ICD-10-CM | POA: Diagnosis not present

## 2015-12-01 DIAGNOSIS — Z940 Kidney transplant status: Secondary | ICD-10-CM | POA: Diagnosis not present

## 2015-12-01 DIAGNOSIS — D638 Anemia in other chronic diseases classified elsewhere: Secondary | ICD-10-CM | POA: Diagnosis not present

## 2015-12-05 DIAGNOSIS — Z940 Kidney transplant status: Secondary | ICD-10-CM | POA: Diagnosis not present

## 2015-12-05 DIAGNOSIS — N184 Chronic kidney disease, stage 4 (severe): Secondary | ICD-10-CM | POA: Diagnosis not present

## 2015-12-05 DIAGNOSIS — Z79899 Other long term (current) drug therapy: Secondary | ICD-10-CM | POA: Diagnosis not present

## 2015-12-15 DIAGNOSIS — D631 Anemia in chronic kidney disease: Secondary | ICD-10-CM | POA: Diagnosis not present

## 2015-12-15 DIAGNOSIS — N184 Chronic kidney disease, stage 4 (severe): Secondary | ICD-10-CM | POA: Diagnosis not present

## 2015-12-21 DIAGNOSIS — D649 Anemia, unspecified: Secondary | ICD-10-CM | POA: Diagnosis not present

## 2015-12-21 DIAGNOSIS — Z940 Kidney transplant status: Secondary | ICD-10-CM | POA: Diagnosis not present

## 2015-12-22 DIAGNOSIS — I10 Essential (primary) hypertension: Secondary | ICD-10-CM | POA: Diagnosis not present

## 2015-12-22 DIAGNOSIS — Z940 Kidney transplant status: Secondary | ICD-10-CM | POA: Diagnosis not present

## 2015-12-22 DIAGNOSIS — E785 Hyperlipidemia, unspecified: Secondary | ICD-10-CM | POA: Diagnosis not present

## 2015-12-22 DIAGNOSIS — D649 Anemia, unspecified: Secondary | ICD-10-CM | POA: Diagnosis not present

## 2015-12-23 DIAGNOSIS — Z940 Kidney transplant status: Secondary | ICD-10-CM | POA: Diagnosis not present

## 2015-12-29 DIAGNOSIS — N924 Excessive bleeding in the premenopausal period: Secondary | ICD-10-CM | POA: Diagnosis not present

## 2016-01-02 DIAGNOSIS — R1031 Right lower quadrant pain: Secondary | ICD-10-CM | POA: Diagnosis not present

## 2016-01-02 DIAGNOSIS — N938 Other specified abnormal uterine and vaginal bleeding: Secondary | ICD-10-CM | POA: Diagnosis not present

## 2016-01-11 DIAGNOSIS — D631 Anemia in chronic kidney disease: Secondary | ICD-10-CM | POA: Diagnosis not present

## 2016-01-11 DIAGNOSIS — N184 Chronic kidney disease, stage 4 (severe): Secondary | ICD-10-CM | POA: Diagnosis not present

## 2016-01-11 DIAGNOSIS — Z940 Kidney transplant status: Secondary | ICD-10-CM | POA: Diagnosis not present

## 2016-01-20 DIAGNOSIS — Z940 Kidney transplant status: Secondary | ICD-10-CM | POA: Diagnosis not present

## 2016-01-27 DIAGNOSIS — N183 Chronic kidney disease, stage 3 (moderate): Secondary | ICD-10-CM | POA: Diagnosis not present

## 2016-01-31 DIAGNOSIS — Z6829 Body mass index (BMI) 29.0-29.9, adult: Secondary | ICD-10-CM | POA: Diagnosis not present

## 2016-01-31 DIAGNOSIS — F419 Anxiety disorder, unspecified: Secondary | ICD-10-CM | POA: Diagnosis not present

## 2016-01-31 DIAGNOSIS — E669 Obesity, unspecified: Secondary | ICD-10-CM | POA: Diagnosis not present

## 2016-01-31 DIAGNOSIS — C4442 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: Secondary | ICD-10-CM | POA: Diagnosis not present

## 2016-01-31 DIAGNOSIS — R109 Unspecified abdominal pain: Secondary | ICD-10-CM | POA: Diagnosis not present

## 2016-02-01 DIAGNOSIS — D638 Anemia in other chronic diseases classified elsewhere: Secondary | ICD-10-CM | POA: Diagnosis not present

## 2016-02-01 DIAGNOSIS — N184 Chronic kidney disease, stage 4 (severe): Secondary | ICD-10-CM | POA: Diagnosis not present

## 2016-02-08 DIAGNOSIS — Z940 Kidney transplant status: Secondary | ICD-10-CM | POA: Diagnosis not present

## 2016-02-13 DIAGNOSIS — N186 End stage renal disease: Secondary | ICD-10-CM | POA: Diagnosis not present

## 2016-02-13 DIAGNOSIS — D500 Iron deficiency anemia secondary to blood loss (chronic): Secondary | ICD-10-CM | POA: Diagnosis not present

## 2016-02-13 DIAGNOSIS — N926 Irregular menstruation, unspecified: Secondary | ICD-10-CM

## 2016-02-13 HISTORY — DX: Irregular menstruation, unspecified: N92.6

## 2016-02-17 DIAGNOSIS — Z6829 Body mass index (BMI) 29.0-29.9, adult: Secondary | ICD-10-CM | POA: Diagnosis not present

## 2016-02-17 DIAGNOSIS — J069 Acute upper respiratory infection, unspecified: Secondary | ICD-10-CM | POA: Diagnosis not present

## 2016-02-17 DIAGNOSIS — H6123 Impacted cerumen, bilateral: Secondary | ICD-10-CM | POA: Diagnosis not present

## 2016-02-22 DIAGNOSIS — D638 Anemia in other chronic diseases classified elsewhere: Secondary | ICD-10-CM | POA: Diagnosis not present

## 2016-02-22 DIAGNOSIS — N184 Chronic kidney disease, stage 4 (severe): Secondary | ICD-10-CM | POA: Diagnosis not present

## 2016-03-09 DIAGNOSIS — H60332 Swimmer's ear, left ear: Secondary | ICD-10-CM | POA: Diagnosis not present

## 2016-03-13 DIAGNOSIS — Z6828 Body mass index (BMI) 28.0-28.9, adult: Secondary | ICD-10-CM | POA: Diagnosis not present

## 2016-03-13 DIAGNOSIS — R109 Unspecified abdominal pain: Secondary | ICD-10-CM | POA: Diagnosis not present

## 2016-03-13 DIAGNOSIS — Z940 Kidney transplant status: Secondary | ICD-10-CM | POA: Diagnosis not present

## 2016-03-13 DIAGNOSIS — R5383 Other fatigue: Secondary | ICD-10-CM | POA: Diagnosis not present

## 2016-03-13 DIAGNOSIS — Z7251 High risk heterosexual behavior: Secondary | ICD-10-CM | POA: Diagnosis not present

## 2016-03-13 DIAGNOSIS — Z79899 Other long term (current) drug therapy: Secondary | ICD-10-CM | POA: Diagnosis not present

## 2016-03-13 DIAGNOSIS — K529 Noninfective gastroenteritis and colitis, unspecified: Secondary | ICD-10-CM | POA: Diagnosis not present

## 2016-03-13 DIAGNOSIS — D649 Anemia, unspecified: Secondary | ICD-10-CM | POA: Diagnosis not present

## 2016-03-13 DIAGNOSIS — Z202 Contact with and (suspected) exposure to infections with a predominantly sexual mode of transmission: Secondary | ICD-10-CM | POA: Diagnosis not present

## 2016-03-13 DIAGNOSIS — N390 Urinary tract infection, site not specified: Secondary | ICD-10-CM | POA: Diagnosis not present

## 2016-03-13 DIAGNOSIS — F419 Anxiety disorder, unspecified: Secondary | ICD-10-CM | POA: Diagnosis not present

## 2016-03-14 DIAGNOSIS — D638 Anemia in other chronic diseases classified elsewhere: Secondary | ICD-10-CM | POA: Diagnosis not present

## 2016-03-14 DIAGNOSIS — N184 Chronic kidney disease, stage 4 (severe): Secondary | ICD-10-CM | POA: Diagnosis not present

## 2016-03-16 DIAGNOSIS — R109 Unspecified abdominal pain: Secondary | ICD-10-CM | POA: Diagnosis not present

## 2016-03-16 DIAGNOSIS — D649 Anemia, unspecified: Secondary | ICD-10-CM | POA: Diagnosis not present

## 2016-03-16 DIAGNOSIS — E785 Hyperlipidemia, unspecified: Secondary | ICD-10-CM | POA: Diagnosis not present

## 2016-03-16 DIAGNOSIS — Z940 Kidney transplant status: Secondary | ICD-10-CM | POA: Diagnosis not present

## 2016-03-16 DIAGNOSIS — Z6828 Body mass index (BMI) 28.0-28.9, adult: Secondary | ICD-10-CM | POA: Diagnosis not present

## 2016-03-16 DIAGNOSIS — I10 Essential (primary) hypertension: Secondary | ICD-10-CM | POA: Diagnosis not present

## 2016-03-17 DIAGNOSIS — R109 Unspecified abdominal pain: Secondary | ICD-10-CM | POA: Diagnosis not present

## 2016-03-17 DIAGNOSIS — S30861A Insect bite (nonvenomous) of abdominal wall, initial encounter: Secondary | ICD-10-CM | POA: Diagnosis not present

## 2016-03-17 DIAGNOSIS — T148 Other injury of unspecified body region: Secondary | ICD-10-CM | POA: Diagnosis not present

## 2016-03-17 DIAGNOSIS — R1011 Right upper quadrant pain: Secondary | ICD-10-CM | POA: Diagnosis not present

## 2016-03-19 DIAGNOSIS — Z6828 Body mass index (BMI) 28.0-28.9, adult: Secondary | ICD-10-CM | POA: Diagnosis not present

## 2016-03-19 DIAGNOSIS — K529 Noninfective gastroenteritis and colitis, unspecified: Secondary | ICD-10-CM | POA: Diagnosis not present

## 2016-03-19 DIAGNOSIS — R109 Unspecified abdominal pain: Secondary | ICD-10-CM | POA: Diagnosis not present

## 2016-03-21 DIAGNOSIS — R109 Unspecified abdominal pain: Secondary | ICD-10-CM | POA: Diagnosis not present

## 2016-03-26 DIAGNOSIS — N184 Chronic kidney disease, stage 4 (severe): Secondary | ICD-10-CM | POA: Diagnosis not present

## 2016-03-26 DIAGNOSIS — D638 Anemia in other chronic diseases classified elsewhere: Secondary | ICD-10-CM | POA: Diagnosis not present

## 2016-03-30 DIAGNOSIS — Z940 Kidney transplant status: Secondary | ICD-10-CM | POA: Diagnosis not present

## 2016-03-30 DIAGNOSIS — N184 Chronic kidney disease, stage 4 (severe): Secondary | ICD-10-CM | POA: Diagnosis not present

## 2016-04-03 DIAGNOSIS — R197 Diarrhea, unspecified: Secondary | ICD-10-CM | POA: Diagnosis not present

## 2016-04-04 DIAGNOSIS — R197 Diarrhea, unspecified: Secondary | ICD-10-CM | POA: Diagnosis not present

## 2016-04-06 DIAGNOSIS — F419 Anxiety disorder, unspecified: Secondary | ICD-10-CM | POA: Diagnosis not present

## 2016-04-06 DIAGNOSIS — R109 Unspecified abdominal pain: Secondary | ICD-10-CM | POA: Diagnosis not present

## 2016-04-06 DIAGNOSIS — Z6829 Body mass index (BMI) 29.0-29.9, adult: Secondary | ICD-10-CM | POA: Diagnosis not present

## 2016-04-13 DIAGNOSIS — K529 Noninfective gastroenteritis and colitis, unspecified: Secondary | ICD-10-CM | POA: Diagnosis not present

## 2016-04-13 DIAGNOSIS — R197 Diarrhea, unspecified: Secondary | ICD-10-CM | POA: Diagnosis not present

## 2016-04-13 HISTORY — PX: COLONOSCOPY: SHX174

## 2016-04-16 DIAGNOSIS — N184 Chronic kidney disease, stage 4 (severe): Secondary | ICD-10-CM | POA: Diagnosis not present

## 2016-04-16 DIAGNOSIS — D631 Anemia in chronic kidney disease: Secondary | ICD-10-CM | POA: Diagnosis not present

## 2016-04-17 DIAGNOSIS — D509 Iron deficiency anemia, unspecified: Secondary | ICD-10-CM | POA: Diagnosis not present

## 2016-04-20 DIAGNOSIS — N184 Chronic kidney disease, stage 4 (severe): Secondary | ICD-10-CM | POA: Diagnosis not present

## 2016-04-20 DIAGNOSIS — Z940 Kidney transplant status: Secondary | ICD-10-CM | POA: Diagnosis not present

## 2016-04-24 DIAGNOSIS — D509 Iron deficiency anemia, unspecified: Secondary | ICD-10-CM | POA: Diagnosis not present

## 2016-04-26 DIAGNOSIS — J069 Acute upper respiratory infection, unspecified: Secondary | ICD-10-CM | POA: Diagnosis not present

## 2016-04-26 DIAGNOSIS — Z6828 Body mass index (BMI) 28.0-28.9, adult: Secondary | ICD-10-CM | POA: Diagnosis not present

## 2016-05-02 DIAGNOSIS — N186 End stage renal disease: Secondary | ICD-10-CM | POA: Diagnosis not present

## 2016-05-02 DIAGNOSIS — I10 Essential (primary) hypertension: Secondary | ICD-10-CM | POA: Diagnosis not present

## 2016-05-02 DIAGNOSIS — Z940 Kidney transplant status: Secondary | ICD-10-CM | POA: Diagnosis not present

## 2016-05-02 DIAGNOSIS — D649 Anemia, unspecified: Secondary | ICD-10-CM | POA: Diagnosis not present

## 2016-05-02 DIAGNOSIS — E785 Hyperlipidemia, unspecified: Secondary | ICD-10-CM | POA: Diagnosis not present

## 2016-05-02 DIAGNOSIS — Z6828 Body mass index (BMI) 28.0-28.9, adult: Secondary | ICD-10-CM | POA: Diagnosis not present

## 2016-05-24 DIAGNOSIS — Z6829 Body mass index (BMI) 29.0-29.9, adult: Secondary | ICD-10-CM | POA: Diagnosis not present

## 2016-05-24 DIAGNOSIS — R350 Frequency of micturition: Secondary | ICD-10-CM | POA: Diagnosis not present

## 2016-05-29 DIAGNOSIS — F419 Anxiety disorder, unspecified: Secondary | ICD-10-CM | POA: Diagnosis not present

## 2016-05-29 DIAGNOSIS — Z940 Kidney transplant status: Secondary | ICD-10-CM | POA: Diagnosis not present

## 2016-05-29 DIAGNOSIS — Z6828 Body mass index (BMI) 28.0-28.9, adult: Secondary | ICD-10-CM | POA: Diagnosis not present

## 2016-05-29 DIAGNOSIS — I409 Acute myocarditis, unspecified: Secondary | ICD-10-CM | POA: Diagnosis not present

## 2016-05-29 DIAGNOSIS — R109 Unspecified abdominal pain: Secondary | ICD-10-CM | POA: Diagnosis not present

## 2016-05-29 DIAGNOSIS — I10 Essential (primary) hypertension: Secondary | ICD-10-CM | POA: Diagnosis not present

## 2016-05-29 DIAGNOSIS — D649 Anemia, unspecified: Secondary | ICD-10-CM | POA: Diagnosis not present

## 2016-06-11 DIAGNOSIS — Z79899 Other long term (current) drug therapy: Secondary | ICD-10-CM | POA: Diagnosis not present

## 2016-06-21 DIAGNOSIS — Z940 Kidney transplant status: Secondary | ICD-10-CM | POA: Diagnosis not present

## 2016-06-21 DIAGNOSIS — Z6829 Body mass index (BMI) 29.0-29.9, adult: Secondary | ICD-10-CM | POA: Diagnosis not present

## 2016-06-21 DIAGNOSIS — Z79899 Other long term (current) drug therapy: Secondary | ICD-10-CM | POA: Diagnosis not present

## 2016-06-21 DIAGNOSIS — R5383 Other fatigue: Secondary | ICD-10-CM | POA: Diagnosis not present

## 2016-06-21 DIAGNOSIS — D649 Anemia, unspecified: Secondary | ICD-10-CM | POA: Diagnosis not present

## 2016-06-23 DIAGNOSIS — J0100 Acute maxillary sinusitis, unspecified: Secondary | ICD-10-CM | POA: Diagnosis not present

## 2016-06-23 DIAGNOSIS — H81399 Other peripheral vertigo, unspecified ear: Secondary | ICD-10-CM | POA: Diagnosis not present

## 2016-07-11 DIAGNOSIS — Z940 Kidney transplant status: Secondary | ICD-10-CM | POA: Diagnosis not present

## 2016-07-12 DIAGNOSIS — F419 Anxiety disorder, unspecified: Secondary | ICD-10-CM | POA: Diagnosis not present

## 2016-07-12 DIAGNOSIS — Z6829 Body mass index (BMI) 29.0-29.9, adult: Secondary | ICD-10-CM | POA: Diagnosis not present

## 2016-07-16 DIAGNOSIS — E785 Hyperlipidemia, unspecified: Secondary | ICD-10-CM | POA: Diagnosis not present

## 2016-07-16 DIAGNOSIS — Z940 Kidney transplant status: Secondary | ICD-10-CM | POA: Diagnosis not present

## 2016-07-16 DIAGNOSIS — N2581 Secondary hyperparathyroidism of renal origin: Secondary | ICD-10-CM | POA: Diagnosis not present

## 2016-07-16 DIAGNOSIS — N390 Urinary tract infection, site not specified: Secondary | ICD-10-CM | POA: Diagnosis not present

## 2016-07-16 DIAGNOSIS — D649 Anemia, unspecified: Secondary | ICD-10-CM | POA: Diagnosis not present

## 2016-07-17 DIAGNOSIS — I10 Essential (primary) hypertension: Secondary | ICD-10-CM | POA: Diagnosis not present

## 2016-07-17 DIAGNOSIS — D649 Anemia, unspecified: Secondary | ICD-10-CM | POA: Diagnosis not present

## 2016-07-17 DIAGNOSIS — Z940 Kidney transplant status: Secondary | ICD-10-CM | POA: Diagnosis not present

## 2016-07-17 DIAGNOSIS — Z23 Encounter for immunization: Secondary | ICD-10-CM | POA: Diagnosis not present

## 2016-07-17 DIAGNOSIS — E785 Hyperlipidemia, unspecified: Secondary | ICD-10-CM | POA: Diagnosis not present

## 2016-07-17 DIAGNOSIS — Z6828 Body mass index (BMI) 28.0-28.9, adult: Secondary | ICD-10-CM | POA: Diagnosis not present

## 2016-07-25 DIAGNOSIS — Z79899 Other long term (current) drug therapy: Secondary | ICD-10-CM | POA: Diagnosis not present

## 2016-07-25 DIAGNOSIS — Z940 Kidney transplant status: Secondary | ICD-10-CM | POA: Diagnosis not present

## 2016-07-25 DIAGNOSIS — N184 Chronic kidney disease, stage 4 (severe): Secondary | ICD-10-CM | POA: Diagnosis not present

## 2016-08-03 DIAGNOSIS — Z940 Kidney transplant status: Secondary | ICD-10-CM | POA: Diagnosis not present

## 2016-08-13 DIAGNOSIS — K529 Noninfective gastroenteritis and colitis, unspecified: Secondary | ICD-10-CM | POA: Diagnosis not present

## 2016-08-13 DIAGNOSIS — F419 Anxiety disorder, unspecified: Secondary | ICD-10-CM | POA: Diagnosis not present

## 2016-08-13 DIAGNOSIS — E669 Obesity, unspecified: Secondary | ICD-10-CM | POA: Diagnosis not present

## 2016-08-13 DIAGNOSIS — Z6829 Body mass index (BMI) 29.0-29.9, adult: Secondary | ICD-10-CM | POA: Diagnosis not present

## 2016-08-14 DIAGNOSIS — H25013 Cortical age-related cataract, bilateral: Secondary | ICD-10-CM | POA: Diagnosis not present

## 2016-08-14 DIAGNOSIS — H5213 Myopia, bilateral: Secondary | ICD-10-CM | POA: Diagnosis not present

## 2016-08-16 DIAGNOSIS — R197 Diarrhea, unspecified: Secondary | ICD-10-CM | POA: Diagnosis not present

## 2016-08-16 DIAGNOSIS — R1032 Left lower quadrant pain: Secondary | ICD-10-CM | POA: Diagnosis not present

## 2016-08-17 DIAGNOSIS — R197 Diarrhea, unspecified: Secondary | ICD-10-CM | POA: Diagnosis not present

## 2016-08-19 DIAGNOSIS — R1033 Periumbilical pain: Secondary | ICD-10-CM | POA: Diagnosis not present

## 2016-08-19 DIAGNOSIS — R109 Unspecified abdominal pain: Secondary | ICD-10-CM | POA: Diagnosis not present

## 2016-08-19 DIAGNOSIS — N390 Urinary tract infection, site not specified: Secondary | ICD-10-CM | POA: Diagnosis not present

## 2016-08-19 DIAGNOSIS — R1011 Right upper quadrant pain: Secondary | ICD-10-CM | POA: Diagnosis not present

## 2016-08-20 DIAGNOSIS — R109 Unspecified abdominal pain: Secondary | ICD-10-CM | POA: Diagnosis not present

## 2016-09-04 DIAGNOSIS — R197 Diarrhea, unspecified: Secondary | ICD-10-CM | POA: Diagnosis not present

## 2016-09-04 DIAGNOSIS — R1030 Lower abdominal pain, unspecified: Secondary | ICD-10-CM | POA: Diagnosis not present

## 2016-09-04 DIAGNOSIS — R1900 Intra-abdominal and pelvic swelling, mass and lump, unspecified site: Secondary | ICD-10-CM | POA: Diagnosis not present

## 2016-09-06 ENCOUNTER — Other Ambulatory Visit: Payer: Self-pay | Admitting: Nephrology

## 2016-09-06 DIAGNOSIS — R1084 Generalized abdominal pain: Secondary | ICD-10-CM | POA: Diagnosis not present

## 2016-09-06 DIAGNOSIS — N12 Tubulo-interstitial nephritis, not specified as acute or chronic: Secondary | ICD-10-CM | POA: Diagnosis not present

## 2016-09-06 DIAGNOSIS — R1900 Intra-abdominal and pelvic swelling, mass and lump, unspecified site: Secondary | ICD-10-CM | POA: Diagnosis not present

## 2016-09-07 DIAGNOSIS — N184 Chronic kidney disease, stage 4 (severe): Secondary | ICD-10-CM | POA: Diagnosis not present

## 2016-09-10 DIAGNOSIS — D649 Anemia, unspecified: Secondary | ICD-10-CM | POA: Diagnosis not present

## 2016-09-10 DIAGNOSIS — N186 End stage renal disease: Secondary | ICD-10-CM | POA: Diagnosis not present

## 2016-09-10 DIAGNOSIS — E785 Hyperlipidemia, unspecified: Secondary | ICD-10-CM | POA: Diagnosis not present

## 2016-09-10 DIAGNOSIS — I10 Essential (primary) hypertension: Secondary | ICD-10-CM | POA: Diagnosis not present

## 2016-09-10 DIAGNOSIS — Z940 Kidney transplant status: Secondary | ICD-10-CM | POA: Diagnosis not present

## 2016-09-28 DIAGNOSIS — Z940 Kidney transplant status: Secondary | ICD-10-CM | POA: Diagnosis not present

## 2016-10-08 DIAGNOSIS — N390 Urinary tract infection, site not specified: Secondary | ICD-10-CM | POA: Diagnosis not present

## 2016-10-08 DIAGNOSIS — Z6829 Body mass index (BMI) 29.0-29.9, adult: Secondary | ICD-10-CM | POA: Diagnosis not present

## 2016-10-08 DIAGNOSIS — M62838 Other muscle spasm: Secondary | ICD-10-CM | POA: Diagnosis not present

## 2016-10-08 DIAGNOSIS — C4442 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: Secondary | ICD-10-CM | POA: Diagnosis not present

## 2016-10-08 DIAGNOSIS — F419 Anxiety disorder, unspecified: Secondary | ICD-10-CM | POA: Diagnosis not present

## 2016-10-08 DIAGNOSIS — R109 Unspecified abdominal pain: Secondary | ICD-10-CM | POA: Diagnosis not present

## 2016-10-16 DIAGNOSIS — C4401 Basal cell carcinoma of skin of lip: Secondary | ICD-10-CM | POA: Diagnosis not present

## 2016-10-16 DIAGNOSIS — D485 Neoplasm of uncertain behavior of skin: Secondary | ICD-10-CM | POA: Diagnosis not present

## 2016-10-23 DIAGNOSIS — N281 Cyst of kidney, acquired: Secondary | ICD-10-CM | POA: Diagnosis not present

## 2016-10-23 DIAGNOSIS — K828 Other specified diseases of gallbladder: Secondary | ICD-10-CM | POA: Diagnosis not present

## 2016-10-23 DIAGNOSIS — Z940 Kidney transplant status: Secondary | ICD-10-CM | POA: Diagnosis not present

## 2016-11-01 DIAGNOSIS — A932 Colorado tick fever: Secondary | ICD-10-CM | POA: Diagnosis not present

## 2016-11-02 DIAGNOSIS — Z79899 Other long term (current) drug therapy: Secondary | ICD-10-CM | POA: Diagnosis not present

## 2016-11-02 DIAGNOSIS — N184 Chronic kidney disease, stage 4 (severe): Secondary | ICD-10-CM | POA: Diagnosis not present

## 2016-11-02 DIAGNOSIS — Z940 Kidney transplant status: Secondary | ICD-10-CM | POA: Diagnosis not present

## 2016-11-02 DIAGNOSIS — R140 Abdominal distension (gaseous): Secondary | ICD-10-CM | POA: Diagnosis not present

## 2016-11-14 DIAGNOSIS — N926 Irregular menstruation, unspecified: Secondary | ICD-10-CM | POA: Diagnosis not present

## 2016-11-15 DIAGNOSIS — R188 Other ascites: Secondary | ICD-10-CM | POA: Diagnosis not present

## 2016-11-15 DIAGNOSIS — R1900 Intra-abdominal and pelvic swelling, mass and lump, unspecified site: Secondary | ICD-10-CM | POA: Diagnosis not present

## 2016-11-29 DIAGNOSIS — D649 Anemia, unspecified: Secondary | ICD-10-CM | POA: Diagnosis not present

## 2016-11-29 DIAGNOSIS — Z940 Kidney transplant status: Secondary | ICD-10-CM | POA: Diagnosis not present

## 2016-12-10 DIAGNOSIS — G4700 Insomnia, unspecified: Secondary | ICD-10-CM | POA: Diagnosis not present

## 2016-12-10 DIAGNOSIS — Z1389 Encounter for screening for other disorder: Secondary | ICD-10-CM | POA: Diagnosis not present

## 2016-12-10 DIAGNOSIS — Z6829 Body mass index (BMI) 29.0-29.9, adult: Secondary | ICD-10-CM | POA: Diagnosis not present

## 2016-12-10 DIAGNOSIS — F419 Anxiety disorder, unspecified: Secondary | ICD-10-CM | POA: Diagnosis not present

## 2016-12-11 DIAGNOSIS — L918 Other hypertrophic disorders of the skin: Secondary | ICD-10-CM | POA: Diagnosis not present

## 2016-12-11 DIAGNOSIS — D225 Melanocytic nevi of trunk: Secondary | ICD-10-CM | POA: Diagnosis not present

## 2016-12-11 DIAGNOSIS — D485 Neoplasm of uncertain behavior of skin: Secondary | ICD-10-CM | POA: Diagnosis not present

## 2016-12-14 DIAGNOSIS — R350 Frequency of micturition: Secondary | ICD-10-CM | POA: Diagnosis not present

## 2016-12-19 DIAGNOSIS — Z940 Kidney transplant status: Secondary | ICD-10-CM | POA: Diagnosis not present

## 2016-12-19 DIAGNOSIS — D631 Anemia in chronic kidney disease: Secondary | ICD-10-CM | POA: Diagnosis not present

## 2016-12-19 DIAGNOSIS — E785 Hyperlipidemia, unspecified: Secondary | ICD-10-CM | POA: Diagnosis not present

## 2016-12-19 DIAGNOSIS — N183 Chronic kidney disease, stage 3 (moderate): Secondary | ICD-10-CM | POA: Diagnosis not present

## 2016-12-19 DIAGNOSIS — I10 Essential (primary) hypertension: Secondary | ICD-10-CM | POA: Diagnosis not present

## 2016-12-24 DIAGNOSIS — Z9181 History of falling: Secondary | ICD-10-CM | POA: Diagnosis not present

## 2016-12-24 DIAGNOSIS — K529 Noninfective gastroenteritis and colitis, unspecified: Secondary | ICD-10-CM | POA: Diagnosis not present

## 2016-12-24 DIAGNOSIS — Z1389 Encounter for screening for other disorder: Secondary | ICD-10-CM | POA: Diagnosis not present

## 2016-12-24 DIAGNOSIS — M5432 Sciatica, left side: Secondary | ICD-10-CM | POA: Diagnosis not present

## 2016-12-24 DIAGNOSIS — E669 Obesity, unspecified: Secondary | ICD-10-CM | POA: Diagnosis not present

## 2016-12-24 DIAGNOSIS — N390 Urinary tract infection, site not specified: Secondary | ICD-10-CM | POA: Diagnosis not present

## 2016-12-24 DIAGNOSIS — Z6830 Body mass index (BMI) 30.0-30.9, adult: Secondary | ICD-10-CM | POA: Diagnosis not present

## 2017-01-30 DIAGNOSIS — E669 Obesity, unspecified: Secondary | ICD-10-CM | POA: Diagnosis not present

## 2017-01-30 DIAGNOSIS — Z6830 Body mass index (BMI) 30.0-30.9, adult: Secondary | ICD-10-CM | POA: Diagnosis not present

## 2017-01-30 DIAGNOSIS — K529 Noninfective gastroenteritis and colitis, unspecified: Secondary | ICD-10-CM | POA: Diagnosis not present

## 2017-01-30 DIAGNOSIS — R1084 Generalized abdominal pain: Secondary | ICD-10-CM | POA: Diagnosis not present

## 2017-02-02 DIAGNOSIS — R079 Chest pain, unspecified: Secondary | ICD-10-CM | POA: Diagnosis not present

## 2017-02-02 DIAGNOSIS — M7989 Other specified soft tissue disorders: Secondary | ICD-10-CM | POA: Diagnosis not present

## 2017-02-02 DIAGNOSIS — R0789 Other chest pain: Secondary | ICD-10-CM | POA: Diagnosis not present

## 2017-02-02 DIAGNOSIS — S46912A Strain of unspecified muscle, fascia and tendon at shoulder and upper arm level, left arm, initial encounter: Secondary | ICD-10-CM | POA: Diagnosis not present

## 2017-02-05 DIAGNOSIS — Z79899 Other long term (current) drug therapy: Secondary | ICD-10-CM | POA: Diagnosis not present

## 2017-02-05 DIAGNOSIS — Z940 Kidney transplant status: Secondary | ICD-10-CM | POA: Diagnosis not present

## 2017-02-07 DIAGNOSIS — N183 Chronic kidney disease, stage 3 (moderate): Secondary | ICD-10-CM | POA: Diagnosis not present

## 2017-02-07 DIAGNOSIS — Z6829 Body mass index (BMI) 29.0-29.9, adult: Secondary | ICD-10-CM | POA: Diagnosis not present

## 2017-02-07 DIAGNOSIS — K529 Noninfective gastroenteritis and colitis, unspecified: Secondary | ICD-10-CM | POA: Diagnosis not present

## 2017-02-07 DIAGNOSIS — F419 Anxiety disorder, unspecified: Secondary | ICD-10-CM | POA: Diagnosis not present

## 2017-02-07 DIAGNOSIS — G4700 Insomnia, unspecified: Secondary | ICD-10-CM | POA: Diagnosis not present

## 2017-02-07 DIAGNOSIS — E663 Overweight: Secondary | ICD-10-CM | POA: Diagnosis not present

## 2017-02-25 DIAGNOSIS — Z940 Kidney transplant status: Secondary | ICD-10-CM | POA: Diagnosis not present

## 2017-02-25 DIAGNOSIS — Z6829 Body mass index (BMI) 29.0-29.9, adult: Secondary | ICD-10-CM | POA: Diagnosis not present

## 2017-02-25 DIAGNOSIS — Z79899 Other long term (current) drug therapy: Secondary | ICD-10-CM | POA: Diagnosis not present

## 2017-02-25 DIAGNOSIS — L309 Dermatitis, unspecified: Secondary | ICD-10-CM | POA: Diagnosis not present

## 2017-02-25 DIAGNOSIS — D631 Anemia in chronic kidney disease: Secondary | ICD-10-CM | POA: Diagnosis not present

## 2017-02-26 DIAGNOSIS — K580 Irritable bowel syndrome with diarrhea: Secondary | ICD-10-CM | POA: Diagnosis not present

## 2017-02-27 DIAGNOSIS — A09 Infectious gastroenteritis and colitis, unspecified: Secondary | ICD-10-CM | POA: Diagnosis not present

## 2017-03-04 DIAGNOSIS — Z940 Kidney transplant status: Secondary | ICD-10-CM | POA: Diagnosis not present

## 2017-03-04 DIAGNOSIS — N183 Chronic kidney disease, stage 3 (moderate): Secondary | ICD-10-CM | POA: Diagnosis not present

## 2017-03-19 DIAGNOSIS — L309 Dermatitis, unspecified: Secondary | ICD-10-CM | POA: Diagnosis not present

## 2017-03-19 DIAGNOSIS — Z6828 Body mass index (BMI) 28.0-28.9, adult: Secondary | ICD-10-CM | POA: Diagnosis not present

## 2017-03-20 DIAGNOSIS — N183 Chronic kidney disease, stage 3 (moderate): Secondary | ICD-10-CM | POA: Diagnosis not present

## 2017-03-20 DIAGNOSIS — E785 Hyperlipidemia, unspecified: Secondary | ICD-10-CM | POA: Diagnosis not present

## 2017-03-20 DIAGNOSIS — D631 Anemia in chronic kidney disease: Secondary | ICD-10-CM | POA: Diagnosis not present

## 2017-03-20 DIAGNOSIS — I10 Essential (primary) hypertension: Secondary | ICD-10-CM | POA: Diagnosis not present

## 2017-03-20 DIAGNOSIS — Z940 Kidney transplant status: Secondary | ICD-10-CM | POA: Diagnosis not present

## 2017-04-01 DIAGNOSIS — A09 Infectious gastroenteritis and colitis, unspecified: Secondary | ICD-10-CM | POA: Diagnosis not present

## 2017-04-08 DIAGNOSIS — R300 Dysuria: Secondary | ICD-10-CM | POA: Diagnosis not present

## 2017-04-08 DIAGNOSIS — Z940 Kidney transplant status: Secondary | ICD-10-CM | POA: Diagnosis not present

## 2017-04-09 DIAGNOSIS — I10 Essential (primary) hypertension: Secondary | ICD-10-CM | POA: Diagnosis not present

## 2017-04-09 DIAGNOSIS — F419 Anxiety disorder, unspecified: Secondary | ICD-10-CM | POA: Diagnosis not present

## 2017-04-09 DIAGNOSIS — G4700 Insomnia, unspecified: Secondary | ICD-10-CM | POA: Diagnosis not present

## 2017-04-09 DIAGNOSIS — R1084 Generalized abdominal pain: Secondary | ICD-10-CM | POA: Diagnosis not present

## 2017-04-09 DIAGNOSIS — Z6829 Body mass index (BMI) 29.0-29.9, adult: Secondary | ICD-10-CM | POA: Diagnosis not present

## 2017-04-10 DIAGNOSIS — Z79899 Other long term (current) drug therapy: Secondary | ICD-10-CM | POA: Diagnosis not present

## 2017-04-10 DIAGNOSIS — R1011 Right upper quadrant pain: Secondary | ICD-10-CM | POA: Insufficient documentation

## 2017-04-10 LAB — COMPREHENSIVE METABOLIC PANEL
ALK PHOS: 43 U/L (ref 38–126)
ALT: 10 U/L — ABNORMAL LOW (ref 14–54)
ANION GAP: 9 (ref 5–15)
AST: 16 U/L (ref 15–41)
Albumin: 3.5 g/dL (ref 3.5–5.0)
BILIRUBIN TOTAL: 0.5 mg/dL (ref 0.3–1.2)
BUN: 21 mg/dL — ABNORMAL HIGH (ref 6–20)
CALCIUM: 8.1 mg/dL — AB (ref 8.9–10.3)
CO2: 26 mmol/L (ref 22–32)
Chloride: 106 mmol/L (ref 101–111)
Creatinine, Ser: 2.18 mg/dL — ABNORMAL HIGH (ref 0.44–1.00)
GFR calc non Af Amer: 27 mL/min — ABNORMAL LOW (ref >60)
GFR, EST AFRICAN AMERICAN: 32 mL/min — AB (ref >60)
Glucose, Bld: 117 mg/dL — ABNORMAL HIGH (ref 65–99)
POTASSIUM: 4.4 mmol/L (ref 3.5–5.1)
Sodium: 141 mmol/L (ref 135–145)
TOTAL PROTEIN: 5.8 g/dL — AB (ref 6.5–8.1)

## 2017-04-10 LAB — CBC
HEMATOCRIT: 38.7 % (ref 36.0–46.0)
HEMOGLOBIN: 11.8 g/dL — AB (ref 12.0–15.0)
MCH: 27.8 pg (ref 26.0–34.0)
MCHC: 30.5 g/dL (ref 30.0–36.0)
MCV: 91.1 fL (ref 78.0–100.0)
Platelets: 243 10*3/uL (ref 150–400)
RBC: 4.25 MIL/uL (ref 3.87–5.11)
RDW: 14.4 % (ref 11.5–15.5)
WBC: 10.6 10*3/uL — ABNORMAL HIGH (ref 4.0–10.5)

## 2017-04-10 LAB — URINALYSIS, ROUTINE W REFLEX MICROSCOPIC
Bilirubin Urine: NEGATIVE
GLUCOSE, UA: NEGATIVE mg/dL
KETONES UR: NEGATIVE mg/dL
NITRITE: NEGATIVE
PH: 5 (ref 5.0–8.0)
Protein, ur: 30 mg/dL — AB
SPECIFIC GRAVITY, URINE: 1.015 (ref 1.005–1.030)

## 2017-04-10 LAB — I-STAT BETA HCG BLOOD, ED (MC, WL, AP ONLY): I-stat hCG, quantitative: <5 m[IU]/mL (ref <5)

## 2017-04-11 ENCOUNTER — Emergency Department (HOSPITAL_COMMUNITY)
Admission: EM | Admit: 2017-04-11 | Discharge: 2017-04-11 | Disposition: A | Discharge status: Home / Self Care | Payer: Medicare Other | Attending: Emergency Medicine | Admitting: Emergency Medicine

## 2017-04-11 ENCOUNTER — Emergency Department (HOSPITAL_COMMUNITY): Payer: Medicare Other

## 2017-04-11 DIAGNOSIS — R1011 Right upper quadrant pain: Secondary | ICD-10-CM

## 2017-04-11 DIAGNOSIS — R1010 Upper abdominal pain, unspecified: Secondary | ICD-10-CM

## 2017-04-11 LAB — LIPASE, BLOOD: Lipase: 57 U/L — ABNORMAL HIGH (ref 11–51)

## 2017-04-11 MED ORDER — OXYCODONE-ACETAMINOPHEN 5-325 MG PO TABS
2.0000 | ORAL_TABLET | Freq: Once | ORAL | Status: AC
Start: 2017-04-11 — End: 2017-04-11
  Administered 2017-04-11: 2 via ORAL
  Filled 2017-04-11: qty 2

## 2017-04-11 NOTE — Discharge Instructions (Signed)
Please call your gastroenterologist in [facility] for continued follow-up

## 2017-04-11 NOTE — ED Provider Notes (Signed)
[facility] DEPT Provider Note   CSN: [id number] Arrival date & time: 04/10/17  1552   By signing my name below, I, Eunice Blase, attest that this documentation has been prepared under the direction and in the presence of Jola Schmidt, MD. Electronically signed, Eunice Blase, ED Scribe. 04/11/17. 2:12 AM.  History   Chief Complaint Chief Complaint  Patient presents with  . Abdominal Pain   The history is provided by the patient and medical records. No language interpreter was used.    Ariel [name] is a 41 y.o. female presenting to the Emergency Department concerning recurrent RUQ pain intermittently x "months". She describes moderate to severe pain that radiates around the back to the LUQ occasionally. Diarrhea, abdominal cramping pain with meals, mild occasional SOB and R sided abdominal distension associated. 2 BM's noted within the past 24 hours. H/o 2 kidney transplants and dialysis treatment reported. She states the first kidney transplant lasted 7 years and the second has lasted 10 years so far. Pt followed for GI by Dr. Lyndel Safe in [place]. She reports a past colonoscopy and denies endoscopy. She also multiple prior C/T scans. Pt states she no longer has menstrual periods. Gallbladder intact. No urinary symptoms. No other complaints at this time.   No past medical history on file.  There are no active problems to display for this patient.   No past surgical history on file.  OB History    No data available       Home Medications    Prior to Admission medications   Medication Sig Start Date End Date Taking? Authorizing Provider  ALPRAZolam Duanne Moron) 0.5 MG tablet Take 0.5 mg by mouth daily as needed for anxiety.  04/09/17  Yes [provider]  carvedilol (COREG) 12.5 MG tablet Take 12.5 mg by mouth 2 (two) times daily. 04/09/17  Yes [provider]  dicyclomine (BENTYL) 10 MG capsule Take 10 mg by mouth 2 (two) times daily. 03/29/17  Yes [provider]  furosemide (LASIX) 40 MG tablet Take 40 mg by mouth 2 (two) times daily. 03/14/17  Yes [provider]  medroxyPROGESTERone (PROVERA) 10 MG tablet Take 10 mg by mouth daily. 04/09/17  Yes [provider]  MYFORTIC 360 MG TBEC EC tablet Take 720 mg by mouth 2 (two) times daily. 03/20/17  Yes [provider]  predniSONE (DELTASONE) 5 MG tablet Take 5 mg by mouth daily. 03/23/17  Yes [provider]  sertraline (ZOLOFT) 100 MG tablet Take 100 mg by mouth daily. 02/21/17  Yes [provider]  simvastatin (ZOCOR) 5 MG tablet Take 5 mg by mouth at bedtime. 03/25/17  Yes [provider]  sodium bicarbonate 650 MG tablet Take 1,300 mg by mouth 2 (two) times daily. 03/25/17  Yes [provider]  tacrolimus (PROGRAF) 0.5 MG capsule Take 0.5 mg by mouth 2 (two) times daily. 03/20/17  Yes [provider]  tacrolimus (PROGRAF) 1 MG capsule Take 1 mg by mouth 2 (two) times daily. 03/20/17  Yes [provider]  traZODone (DESYREL) 50 MG tablet Take 50 mg by mouth at bedtime. 04/09/17  Yes [provider]    Family History No family history on file.  Social History Social History  Substance Use Topics  . Smoking status: Not on file  . Smokeless tobacco: Not on file  . Alcohol use Not on file     Allergies   Bactrim [sulfamethoxazole-trimethoprim]   Review of Systems Review of Systems  Constitutional: Negative for  diaphoresis, fatigue and fever.  Respiratory: Positive for shortness of breath.   Cardiovascular: Negative for chest pain.  Gastrointestinal: Positive for abdominal distention, abdominal pain and diarrhea. Negative for nausea and vomiting.  Skin: Negative for color change and wound.  Neurological: Negative for weakness and numbness.  Hematological: Does not bruise/bleed easily.  All other systems reviewed and are negative.    Physical Exam Updated Vital Signs BP (!) 150/97 (BP Location:  Right Arm)   Pulse 70   Resp (!) 24   SpO2 100%   Physical Exam  Constitutional: She is oriented to person, place, and time. She appears well-developed and well-nourished. No distress.  HENT:  Head: Normocephalic and atraumatic.  Eyes: EOM are normal.  Neck: Normal range of motion.  Cardiovascular: Normal rate, regular rhythm and normal heart sounds.   Pulmonary/Chest: Effort normal and breath sounds normal.  Abdominal: Soft. She exhibits no distension. There is tenderness in the right upper quadrant.  No tenderness over transplanted kidney  Musculoskeletal: Normal range of motion.  Neurological: She is alert and oriented to person, place, and time.  Skin: Skin is warm and dry.  Psychiatric: She has a normal mood and affect. Judgment normal.  Nursing note and vitals reviewed.    ED Treatments / Results  DIAGNOSTIC STUDIES: Oxygen Saturation is 100% on RA, NL by my interpretation.    COORDINATION OF CARE: 2:04 AM-Discussed next steps with pt. Pt verbalized understanding and is agreeable with the plan. Will order pain medications and [place].   Labs (all labs ordered are listed, but only abnormal results are displayed) Labs Reviewed  CBC - Abnormal; Notable for the following:       Result Value   WBC 10.6 (*)    Hemoglobin 11.8 (*)    All other components within normal limits  COMPREHENSIVE METABOLIC PANEL - Abnormal; Notable for the following:    Glucose, Bld 117 (*)    BUN 21 (*)    Creatinine, Ser 2.18 (*)    Calcium 8.1 (*)    Total Protein 5.8 (*)    ALT 10 (*)    GFR calc non Af Amer 27 (*)    GFR calc Af Amer 32 (*)    All other components within normal limits  URINALYSIS, ROUTINE W REFLEX MICROSCOPIC - Abnormal; Notable for the following:    APPearance HAZY (*)    Hgb urine dipstick SMALL (*)    Protein, ur 30 (*)    Leukocytes, UA MODERATE (*)    Bacteria, UA RARE (*)    Squamous Epithelial / LPF 0-5 (*)    All other components within normal limits  LIPASE,  BLOOD - Abnormal; Notable for the following:    Lipase 57 (*)    All other components within normal limits  I-STAT BETA HCG BLOOD, ED ([facility], [facility], [facility] ONLY)    EKG  EKG Interpretation None       Radiology US Abdomen Limited Ruq  Result Date: 04/11/2017 CLINICAL DATA:  Acute onset of right upper quadrant abdominal pain. Initial encounter. EXAM: ULTRASOUND ABDOMEN LIMITED RIGHT UPPER QUADRANT COMPARISON:  None. FINDINGS: Gallbladder: A small amount of pericholecystic fluid is noted at the gallbladder fundus. This likely reflects underlying trace ascites. No gallbladder wall thickening or pericholecystic fluid is seen. No ultrasonographic Murphy's sign is elicited. No stones are seen. Common bile duct: Diameter: 0.3 cm, within normal limits in caliber. Liver: No focal lesion identified. Within normal limits in parenchymal echogenicity. Trace ascites is noted tracking  about the liver. A small amount of complex fluid is seen tracking about the right iliac fossa transplant kidney. Per clinical correlation, this has been present on prior studies. IMPRESSION: 1. No acute abnormality seen at the right upper quadrant. 2. Small amount of pericholecystic fluid appears to reflect underlying trace ascites. Gallbladder unremarkable in appearance. 3. Small amount of complex fluid tracking about the right iliac fossa transplant kidney. Per clinical correlation, this has been present on prior studies. Electronically Signed   By: Garald Balding M.D.   On: 04/11/2017 03:59    Procedures Procedures (including critical care time)  Medications Ordered in ED Medications  oxyCODONE-acetaminophen (PERCOCET/ROXICET) 5-325 MG per tablet 2 tablet (2 tablets Oral Given 04/11/17 0221)     Initial Impression / Assessment and Plan / ED Course  I have reviewed the triage vital signs and the nursing notes.  Pertinent labs & imaging results that were available during my care of the patient were reviewed by me and considered in  my medical decision making (see chart for details).     4:38 AM Patient's pain is improved.  LFTs and lipase within normal limits.  Ultrasound demonstrates no evidence of cholelithiasis.  Doubt cholecystitis.  Patient will need ongoing follow-up with her gastroenterologist.  She'll benefit from upper endoscopy  Final Clinical Impressions(s) / ED Diagnoses   Final diagnoses:  Upper abdominal pain    New Prescriptions New Prescriptions   No medications on file   I personally performed the services described in this documentation, which was scribed in my presence. The recorded information has been reviewed and is accurate.        Jola Schmidt, MD 04/11/17 [phone]

## 2017-04-11 NOTE — ED Notes (Signed)
ED Provider at bedside. 

## 2017-04-11 NOTE — ED Notes (Signed)
Patient transported to Ultrasound 

## 2017-04-15 DIAGNOSIS — R350 Frequency of micturition: Secondary | ICD-10-CM | POA: Diagnosis not present

## 2017-04-15 DIAGNOSIS — Z940 Kidney transplant status: Secondary | ICD-10-CM | POA: Diagnosis not present

## 2017-04-29 DIAGNOSIS — N183 Chronic kidney disease, stage 3 (moderate): Secondary | ICD-10-CM | POA: Diagnosis not present

## 2017-05-28 DIAGNOSIS — I10 Essential (primary) hypertension: Secondary | ICD-10-CM | POA: Diagnosis not present

## 2017-05-28 DIAGNOSIS — Z940 Kidney transplant status: Secondary | ICD-10-CM | POA: Diagnosis not present

## 2017-05-28 DIAGNOSIS — D631 Anemia in chronic kidney disease: Secondary | ICD-10-CM | POA: Diagnosis not present

## 2017-06-03 DIAGNOSIS — R0789 Other chest pain: Secondary | ICD-10-CM | POA: Diagnosis not present

## 2017-06-03 DIAGNOSIS — I208 Other forms of angina pectoris: Secondary | ICD-10-CM | POA: Diagnosis not present

## 2017-06-03 DIAGNOSIS — R0602 Shortness of breath: Secondary | ICD-10-CM | POA: Diagnosis not present

## 2017-06-03 DIAGNOSIS — N189 Chronic kidney disease, unspecified: Secondary | ICD-10-CM | POA: Diagnosis not present

## 2017-06-03 DIAGNOSIS — E039 Hypothyroidism, unspecified: Secondary | ICD-10-CM | POA: Diagnosis not present

## 2017-06-03 DIAGNOSIS — D649 Anemia, unspecified: Secondary | ICD-10-CM | POA: Diagnosis not present

## 2017-06-03 DIAGNOSIS — Z79899 Other long term (current) drug therapy: Secondary | ICD-10-CM | POA: Diagnosis not present

## 2017-06-03 DIAGNOSIS — I200 Unstable angina: Secondary | ICD-10-CM | POA: Diagnosis not present

## 2017-06-03 DIAGNOSIS — Z940 Kidney transplant status: Secondary | ICD-10-CM | POA: Diagnosis not present

## 2017-06-03 DIAGNOSIS — Z7952 Long term (current) use of systemic steroids: Secondary | ICD-10-CM | POA: Diagnosis not present

## 2017-06-03 DIAGNOSIS — I10 Essential (primary) hypertension: Secondary | ICD-10-CM | POA: Diagnosis not present

## 2017-06-03 DIAGNOSIS — R011 Cardiac murmur, unspecified: Secondary | ICD-10-CM | POA: Diagnosis not present

## 2017-06-03 DIAGNOSIS — I129 Hypertensive chronic kidney disease with stage 1 through stage 4 chronic kidney disease, or unspecified chronic kidney disease: Secondary | ICD-10-CM | POA: Diagnosis not present

## 2017-06-03 DIAGNOSIS — R42 Dizziness and giddiness: Secondary | ICD-10-CM | POA: Diagnosis not present

## 2017-06-04 DIAGNOSIS — Z940 Kidney transplant status: Secondary | ICD-10-CM | POA: Diagnosis not present

## 2017-06-04 DIAGNOSIS — N189 Chronic kidney disease, unspecified: Secondary | ICD-10-CM | POA: Diagnosis not present

## 2017-06-04 DIAGNOSIS — R011 Cardiac murmur, unspecified: Secondary | ICD-10-CM | POA: Diagnosis not present

## 2017-06-04 DIAGNOSIS — I10 Essential (primary) hypertension: Secondary | ICD-10-CM | POA: Diagnosis not present

## 2017-06-04 DIAGNOSIS — R079 Chest pain, unspecified: Secondary | ICD-10-CM | POA: Diagnosis not present

## 2017-06-04 DIAGNOSIS — I208 Other forms of angina pectoris: Secondary | ICD-10-CM | POA: Diagnosis not present

## 2017-06-04 DIAGNOSIS — D649 Anemia, unspecified: Secondary | ICD-10-CM | POA: Diagnosis not present

## 2017-06-04 DIAGNOSIS — E039 Hypothyroidism, unspecified: Secondary | ICD-10-CM | POA: Diagnosis not present

## 2017-06-04 DIAGNOSIS — I200 Unstable angina: Secondary | ICD-10-CM | POA: Diagnosis not present

## 2017-06-08 DIAGNOSIS — M65879 Other synovitis and tenosynovitis, unspecified ankle and foot: Secondary | ICD-10-CM | POA: Diagnosis not present

## 2017-06-10 DIAGNOSIS — Z6829 Body mass index (BMI) 29.0-29.9, adult: Secondary | ICD-10-CM | POA: Diagnosis not present

## 2017-06-10 DIAGNOSIS — G4700 Insomnia, unspecified: Secondary | ICD-10-CM | POA: Diagnosis not present

## 2017-06-10 DIAGNOSIS — M79671 Pain in right foot: Secondary | ICD-10-CM | POA: Diagnosis not present

## 2017-06-10 DIAGNOSIS — I10 Essential (primary) hypertension: Secondary | ICD-10-CM | POA: Diagnosis not present

## 2017-06-10 DIAGNOSIS — F419 Anxiety disorder, unspecified: Secondary | ICD-10-CM | POA: Diagnosis not present

## 2017-06-11 DIAGNOSIS — Z79899 Other long term (current) drug therapy: Secondary | ICD-10-CM | POA: Diagnosis not present

## 2017-06-11 DIAGNOSIS — Z6829 Body mass index (BMI) 29.0-29.9, adult: Secondary | ICD-10-CM | POA: Diagnosis not present

## 2017-06-11 DIAGNOSIS — N183 Chronic kidney disease, stage 3 (moderate): Secondary | ICD-10-CM | POA: Diagnosis not present

## 2017-06-11 DIAGNOSIS — R0789 Other chest pain: Secondary | ICD-10-CM | POA: Diagnosis not present

## 2017-06-18 DIAGNOSIS — Z940 Kidney transplant status: Secondary | ICD-10-CM | POA: Diagnosis not present

## 2017-06-18 DIAGNOSIS — N184 Chronic kidney disease, stage 4 (severe): Secondary | ICD-10-CM | POA: Diagnosis not present

## 2017-06-18 DIAGNOSIS — Z23 Encounter for immunization: Secondary | ICD-10-CM | POA: Diagnosis not present

## 2017-06-18 DIAGNOSIS — I10 Essential (primary) hypertension: Secondary | ICD-10-CM | POA: Diagnosis not present

## 2017-06-18 DIAGNOSIS — E872 Acidosis: Secondary | ICD-10-CM | POA: Diagnosis not present

## 2017-06-19 DIAGNOSIS — Z940 Kidney transplant status: Secondary | ICD-10-CM | POA: Diagnosis not present

## 2017-06-25 DIAGNOSIS — N390 Urinary tract infection, site not specified: Secondary | ICD-10-CM | POA: Diagnosis not present

## 2017-06-25 DIAGNOSIS — Z6829 Body mass index (BMI) 29.0-29.9, adult: Secondary | ICD-10-CM | POA: Diagnosis not present

## 2017-06-25 DIAGNOSIS — R110 Nausea: Secondary | ICD-10-CM | POA: Diagnosis not present

## 2017-06-25 DIAGNOSIS — R300 Dysuria: Secondary | ICD-10-CM | POA: Diagnosis not present

## 2017-07-15 DIAGNOSIS — Z940 Kidney transplant status: Secondary | ICD-10-CM | POA: Diagnosis not present

## 2017-07-15 DIAGNOSIS — E785 Hyperlipidemia, unspecified: Secondary | ICD-10-CM | POA: Diagnosis not present

## 2017-07-15 DIAGNOSIS — D631 Anemia in chronic kidney disease: Secondary | ICD-10-CM | POA: Diagnosis not present

## 2017-07-16 DIAGNOSIS — Z940 Kidney transplant status: Secondary | ICD-10-CM | POA: Diagnosis not present

## 2017-07-16 DIAGNOSIS — I10 Essential (primary) hypertension: Secondary | ICD-10-CM | POA: Diagnosis not present

## 2017-07-16 DIAGNOSIS — R350 Frequency of micturition: Secondary | ICD-10-CM | POA: Diagnosis not present

## 2017-07-16 DIAGNOSIS — N186 End stage renal disease: Secondary | ICD-10-CM | POA: Diagnosis not present

## 2017-07-17 DIAGNOSIS — N390 Urinary tract infection, site not specified: Secondary | ICD-10-CM | POA: Diagnosis not present

## 2017-07-30 DIAGNOSIS — K2900 Acute gastritis without bleeding: Secondary | ICD-10-CM | POA: Diagnosis not present

## 2017-07-30 DIAGNOSIS — R1013 Epigastric pain: Secondary | ICD-10-CM | POA: Diagnosis not present

## 2017-07-30 DIAGNOSIS — K580 Irritable bowel syndrome with diarrhea: Secondary | ICD-10-CM | POA: Diagnosis not present

## 2017-07-31 DIAGNOSIS — E785 Hyperlipidemia, unspecified: Secondary | ICD-10-CM | POA: Diagnosis not present

## 2017-07-31 DIAGNOSIS — K219 Gastro-esophageal reflux disease without esophagitis: Secondary | ICD-10-CM | POA: Diagnosis not present

## 2017-07-31 DIAGNOSIS — R1013 Epigastric pain: Secondary | ICD-10-CM | POA: Diagnosis not present

## 2017-07-31 DIAGNOSIS — D509 Iron deficiency anemia, unspecified: Secondary | ICD-10-CM | POA: Diagnosis not present

## 2017-07-31 DIAGNOSIS — K2210 Ulcer of esophagus without bleeding: Secondary | ICD-10-CM | POA: Diagnosis not present

## 2017-07-31 DIAGNOSIS — K2950 Unspecified chronic gastritis without bleeding: Secondary | ICD-10-CM | POA: Diagnosis not present

## 2017-07-31 DIAGNOSIS — K2900 Acute gastritis without bleeding: Secondary | ICD-10-CM | POA: Diagnosis not present

## 2017-07-31 DIAGNOSIS — K210 Gastro-esophageal reflux disease with esophagitis: Secondary | ICD-10-CM | POA: Diagnosis not present

## 2017-07-31 DIAGNOSIS — F419 Anxiety disorder, unspecified: Secondary | ICD-10-CM | POA: Diagnosis not present

## 2017-07-31 DIAGNOSIS — R112 Nausea with vomiting, unspecified: Secondary | ICD-10-CM | POA: Diagnosis not present

## 2017-07-31 DIAGNOSIS — K580 Irritable bowel syndrome with diarrhea: Secondary | ICD-10-CM | POA: Diagnosis not present

## 2017-07-31 DIAGNOSIS — Z940 Kidney transplant status: Secondary | ICD-10-CM | POA: Diagnosis not present

## 2017-07-31 DIAGNOSIS — K257 Chronic gastric ulcer without hemorrhage or perforation: Secondary | ICD-10-CM | POA: Diagnosis not present

## 2017-07-31 DIAGNOSIS — Z7952 Long term (current) use of systemic steroids: Secondary | ICD-10-CM | POA: Diagnosis not present

## 2017-07-31 DIAGNOSIS — K449 Diaphragmatic hernia without obstruction or gangrene: Secondary | ICD-10-CM | POA: Diagnosis not present

## 2017-07-31 DIAGNOSIS — Z79899 Other long term (current) drug therapy: Secondary | ICD-10-CM | POA: Diagnosis not present

## 2017-07-31 DIAGNOSIS — I129 Hypertensive chronic kidney disease with stage 1 through stage 4 chronic kidney disease, or unspecified chronic kidney disease: Secondary | ICD-10-CM | POA: Diagnosis not present

## 2017-07-31 DIAGNOSIS — N183 Chronic kidney disease, stage 3 (moderate): Secondary | ICD-10-CM | POA: Diagnosis not present

## 2017-07-31 HISTORY — PX: ESOPHAGOGASTRODUODENOSCOPY: SHX1529

## 2017-08-13 DIAGNOSIS — N183 Chronic kidney disease, stage 3 (moderate): Secondary | ICD-10-CM | POA: Diagnosis not present

## 2017-08-13 DIAGNOSIS — Z6829 Body mass index (BMI) 29.0-29.9, adult: Secondary | ICD-10-CM | POA: Diagnosis not present

## 2017-08-13 DIAGNOSIS — I10 Essential (primary) hypertension: Secondary | ICD-10-CM | POA: Diagnosis not present

## 2017-08-13 DIAGNOSIS — G4700 Insomnia, unspecified: Secondary | ICD-10-CM | POA: Diagnosis not present

## 2017-08-13 DIAGNOSIS — E663 Overweight: Secondary | ICD-10-CM | POA: Diagnosis not present

## 2017-08-13 DIAGNOSIS — F419 Anxiety disorder, unspecified: Secondary | ICD-10-CM | POA: Diagnosis not present

## 2017-08-14 DIAGNOSIS — N926 Irregular menstruation, unspecified: Secondary | ICD-10-CM | POA: Diagnosis not present

## 2017-08-14 DIAGNOSIS — Z124 Encounter for screening for malignant neoplasm of cervix: Secondary | ICD-10-CM | POA: Diagnosis not present

## 2017-08-14 DIAGNOSIS — Z01419 Encounter for gynecological examination (general) (routine) without abnormal findings: Secondary | ICD-10-CM | POA: Diagnosis not present

## 2017-08-14 DIAGNOSIS — Z1239 Encounter for other screening for malignant neoplasm of breast: Secondary | ICD-10-CM | POA: Diagnosis not present

## 2017-08-15 DIAGNOSIS — H25813 Combined forms of age-related cataract, bilateral: Secondary | ICD-10-CM | POA: Diagnosis not present

## 2017-08-16 DIAGNOSIS — Z940 Kidney transplant status: Secondary | ICD-10-CM | POA: Diagnosis not present

## 2017-09-07 DIAGNOSIS — J069 Acute upper respiratory infection, unspecified: Secondary | ICD-10-CM | POA: Diagnosis not present

## 2017-09-07 DIAGNOSIS — R05 Cough: Secondary | ICD-10-CM | POA: Diagnosis not present

## 2017-09-11 DIAGNOSIS — R05 Cough: Secondary | ICD-10-CM | POA: Diagnosis not present

## 2017-09-11 DIAGNOSIS — Z6828 Body mass index (BMI) 28.0-28.9, adult: Secondary | ICD-10-CM | POA: Diagnosis not present

## 2017-09-11 DIAGNOSIS — B349 Viral infection, unspecified: Secondary | ICD-10-CM | POA: Diagnosis not present

## 2017-09-12 DIAGNOSIS — R918 Other nonspecific abnormal finding of lung field: Secondary | ICD-10-CM | POA: Diagnosis not present

## 2017-09-12 DIAGNOSIS — B349 Viral infection, unspecified: Secondary | ICD-10-CM | POA: Diagnosis not present

## 2017-09-12 DIAGNOSIS — R05 Cough: Secondary | ICD-10-CM | POA: Diagnosis not present

## 2017-09-12 DIAGNOSIS — Z6828 Body mass index (BMI) 28.0-28.9, adult: Secondary | ICD-10-CM | POA: Diagnosis not present

## 2017-10-02 DIAGNOSIS — J209 Acute bronchitis, unspecified: Secondary | ICD-10-CM | POA: Diagnosis not present

## 2017-10-02 DIAGNOSIS — R05 Cough: Secondary | ICD-10-CM | POA: Diagnosis not present

## 2017-10-02 DIAGNOSIS — R51 Headache: Secondary | ICD-10-CM | POA: Diagnosis not present

## 2017-10-02 DIAGNOSIS — R079 Chest pain, unspecified: Secondary | ICD-10-CM | POA: Diagnosis not present

## 2017-10-07 DIAGNOSIS — Z6828 Body mass index (BMI) 28.0-28.9, adult: Secondary | ICD-10-CM | POA: Diagnosis not present

## 2017-10-07 DIAGNOSIS — E663 Overweight: Secondary | ICD-10-CM | POA: Diagnosis not present

## 2017-10-07 DIAGNOSIS — J209 Acute bronchitis, unspecified: Secondary | ICD-10-CM | POA: Diagnosis not present

## 2017-10-12 DIAGNOSIS — J209 Acute bronchitis, unspecified: Secondary | ICD-10-CM | POA: Diagnosis not present

## 2017-10-13 DIAGNOSIS — Z940 Kidney transplant status: Secondary | ICD-10-CM | POA: Diagnosis not present

## 2017-10-13 DIAGNOSIS — J111 Influenza due to unidentified influenza virus with other respiratory manifestations: Secondary | ICD-10-CM | POA: Diagnosis not present

## 2017-10-13 DIAGNOSIS — N251 Nephrogenic diabetes insipidus: Secondary | ICD-10-CM | POA: Diagnosis not present

## 2017-10-13 DIAGNOSIS — J181 Lobar pneumonia, unspecified organism: Secondary | ICD-10-CM | POA: Diagnosis not present

## 2017-10-13 DIAGNOSIS — R05 Cough: Secondary | ICD-10-CM | POA: Diagnosis not present

## 2017-10-17 DIAGNOSIS — G4700 Insomnia, unspecified: Secondary | ICD-10-CM | POA: Diagnosis not present

## 2017-10-17 DIAGNOSIS — R05 Cough: Secondary | ICD-10-CM | POA: Diagnosis not present

## 2017-10-17 DIAGNOSIS — Z6828 Body mass index (BMI) 28.0-28.9, adult: Secondary | ICD-10-CM | POA: Diagnosis not present

## 2017-10-17 DIAGNOSIS — F419 Anxiety disorder, unspecified: Secondary | ICD-10-CM | POA: Diagnosis not present

## 2017-10-20 ENCOUNTER — Encounter (HOSPITAL_COMMUNITY): Payer: Self-pay

## 2017-10-20 ENCOUNTER — Other Ambulatory Visit: Payer: Self-pay

## 2017-10-20 ENCOUNTER — Emergency Department (HOSPITAL_COMMUNITY)
Admission: EM | Admit: 2017-10-20 | Discharge: 2017-10-20 | Disposition: A | Discharge status: Home / Self Care | Payer: Medicare Other | Attending: Emergency Medicine | Admitting: Emergency Medicine

## 2017-10-20 ENCOUNTER — Emergency Department (HOSPITAL_COMMUNITY): Payer: Medicare Other

## 2017-10-20 DIAGNOSIS — Z8541 Personal history of malignant neoplasm of cervix uteri: Secondary | ICD-10-CM | POA: Diagnosis not present

## 2017-10-20 DIAGNOSIS — R0982 Postnasal drip: Secondary | ICD-10-CM | POA: Insufficient documentation

## 2017-10-20 DIAGNOSIS — R0981 Nasal congestion: Secondary | ICD-10-CM | POA: Insufficient documentation

## 2017-10-20 DIAGNOSIS — R51 Headache: Secondary | ICD-10-CM | POA: Insufficient documentation

## 2017-10-20 DIAGNOSIS — E860 Dehydration: Secondary | ICD-10-CM | POA: Diagnosis not present

## 2017-10-20 DIAGNOSIS — Z940 Kidney transplant status: Secondary | ICD-10-CM | POA: Diagnosis not present

## 2017-10-20 DIAGNOSIS — R05 Cough: Secondary | ICD-10-CM | POA: Diagnosis not present

## 2017-10-20 DIAGNOSIS — R0600 Dyspnea, unspecified: Secondary | ICD-10-CM | POA: Diagnosis not present

## 2017-10-20 DIAGNOSIS — Z79899 Other long term (current) drug therapy: Secondary | ICD-10-CM | POA: Diagnosis not present

## 2017-10-20 DIAGNOSIS — J40 Bronchitis, not specified as acute or chronic: Secondary | ICD-10-CM | POA: Insufficient documentation

## 2017-10-20 DIAGNOSIS — N186 End stage renal disease: Secondary | ICD-10-CM | POA: Insufficient documentation

## 2017-10-20 LAB — URINALYSIS, ROUTINE W REFLEX MICROSCOPIC
BILIRUBIN URINE: NEGATIVE
Glucose, UA: NEGATIVE mg/dL
KETONES UR: NEGATIVE mg/dL
Nitrite: NEGATIVE
PROTEIN: NEGATIVE mg/dL
Specific Gravity, Urine: 1.008 (ref 1.005–1.030)
pH: 5 (ref 5.0–8.0)

## 2017-10-20 LAB — I-STAT TROPONIN, ED: Troponin i, poc: 0 ng/mL (ref 0.00–0.08)

## 2017-10-20 LAB — HEPATIC FUNCTION PANEL
ALT: 10 U/L — AB (ref 14–54)
AST: 12 U/L — ABNORMAL LOW (ref 15–41)
Albumin: 3 g/dL — ABNORMAL LOW (ref 3.5–5.0)
Alkaline Phosphatase: 53 U/L (ref 38–126)
TOTAL PROTEIN: 5.9 g/dL — AB (ref 6.5–8.1)
Total Bilirubin: 0.5 mg/dL (ref 0.3–1.2)

## 2017-10-20 LAB — MONONUCLEOSIS SCREEN: MONO SCREEN: NEGATIVE

## 2017-10-20 LAB — CBC
HEMATOCRIT: 39.6 % (ref 36.0–46.0)
Hemoglobin: 12.5 g/dL (ref 12.0–15.0)
MCH: 28.7 pg (ref 26.0–34.0)
MCHC: 31.6 g/dL (ref 30.0–36.0)
MCV: 91 fL (ref 78.0–100.0)
Platelets: 410 10*3/uL — ABNORMAL HIGH (ref 150–400)
RBC: 4.35 MIL/uL (ref 3.87–5.11)
RDW: 14.6 % (ref 11.5–15.5)
WBC: 17.6 10*3/uL — ABNORMAL HIGH (ref 4.0–10.5)

## 2017-10-20 LAB — BASIC METABOLIC PANEL
Anion gap: 10 (ref 5–15)
BUN: 19 mg/dL (ref 6–20)
CHLORIDE: 109 mmol/L (ref 101–111)
CO2: 14 mmol/L — AB (ref 22–32)
CREATININE: 2.08 mg/dL — AB (ref 0.44–1.00)
Calcium: 7.4 mg/dL — ABNORMAL LOW (ref 8.9–10.3)
GFR calc Af Amer: 33 mL/min — ABNORMAL LOW (ref >60)
GFR calc non Af Amer: 29 mL/min — ABNORMAL LOW (ref >60)
GLUCOSE: 111 mg/dL — AB (ref 65–99)
POTASSIUM: 3.8 mmol/L (ref 3.5–5.1)
Sodium: 133 mmol/L — ABNORMAL LOW (ref 135–145)

## 2017-10-20 MED ORDER — DIPHENHYDRAMINE HCL 50 MG/ML IJ SOLN
12.5000 mg | Freq: Once | INTRAMUSCULAR | Status: AC
  Administered 2017-10-20: 12.5 mg via INTRAVENOUS
  Filled 2017-10-20: qty 1

## 2017-10-20 MED ORDER — HYDROCODONE-ACETAMINOPHEN 5-325 MG PO TABS
1.0000 | ORAL_TABLET | Freq: Once | ORAL | Status: AC
  Administered 2017-10-20: 1 via ORAL
  Filled 2017-10-20: qty 1

## 2017-10-20 MED ORDER — LORAZEPAM 2 MG/ML IJ SOLN
1.0000 mg | Freq: Once | INTRAMUSCULAR | Status: AC
Start: 2017-10-20 — End: 2017-10-20
  Administered 2017-10-20: 1 mg via INTRAVENOUS
  Filled 2017-10-20: qty 1

## 2017-10-20 MED ORDER — SODIUM CHLORIDE 0.9 % IV BOLUS (SEPSIS)
1000.0000 mL | Freq: Once | INTRAVENOUS | Status: AC
  Administered 2017-10-20: 1000 mL via INTRAVENOUS

## 2017-10-20 MED ORDER — SODIUM CHLORIDE 0.9 % IV SOLN
INTRAVENOUS | Status: DC
  Administered 2017-10-20: 19:00:00 via INTRAVENOUS

## 2017-10-20 MED ORDER — MORPHINE SULFATE (PF) 4 MG/ML IV SOLN
4.0000 mg | Freq: Once | INTRAVENOUS | Status: AC
  Administered 2017-10-20: 4 mg via INTRAVENOUS
  Filled 2017-10-20: qty 1

## 2017-10-20 MED ORDER — METOCLOPRAMIDE HCL 5 MG/ML IJ SOLN
10.0000 mg | Freq: Once | INTRAMUSCULAR | Status: AC
  Administered 2017-10-20: 10 mg via INTRAVENOUS
  Filled 2017-10-20: qty 2

## 2017-10-20 NOTE — ED Provider Notes (Signed)
[facility] EMERGENCY DEPARTMENT Provider Note   CSN: [id number] Arrival date & time: 10/20/17  1747     History   Chief Complaint Chief Complaint  Patient presents with  . Cough  . Migraine    HPI Ariel [name] is a 42 y.o. female.  42 year old female presents with several week history of cough and congestion.  Diagnosed a week ago with influenza and possibly pneumonia.  Took a course of Tamiflu continues to have symptoms of coughing.  Does have a history of kidney transplant but denies any recent history of fever.  No vomiting or diarrhea.  No dysuria or hematuria and states that her urine has not changed.  Denies any neck pain but has had a mild frontal headache.  Some postnasal drip with some right-sided facial pain as well as congestion.      Past Medical History:  Diagnosis Date  . Anemia   . Cancer ([place])    pre cervical  . 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 History of renal transplant   . Hypercholesterolemia   . 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 Renal failure, chronic    Dialysis in the past  . Vulvar dystrophy     Patient Active Problem List   Diagnosis Date Noted  . Mechanical complication of other vascular device, implant, and graft 09/02/2012  . End stage renal disease ([place]) 09/02/2012  . DVT of axillary vein, acute left ([place]) 08/02/2012  . S/p cadaver renal transplant 08/02/2012  . Hyperlipidemia 08/02/2012    Past Surgical History:  Procedure Laterality Date  . AV FISTULA PLACEMENT    . INSERTION OF DIALYSIS CATHETER    . [place], 2008  . PARATHYROIDECTOMY  2004   Portion on the parathyroid gland transplanted in R forearm  . REMOVAL OF A DIALYSIS CATHETER    . SIMPLE VULVECTOMY  06/19/2011   Partial simple left  . 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  TUBAL LIGATION      OB History    No data available       Home Medications    Prior to Admission medications   Medication Sig Start Date End Date Taking? Authorizing Provider  ALPRAZolam Duanne Moron) 0.5 MG tablet TK 1 T PO  D 05/31/15   [provider]  ALPRAZolam Duanne Moron) 0.5 MG tablet Take 0.5 mg by mouth daily as needed for anxiety.  04/09/17   [provider]  carvedilol (COREG) 12.5 MG tablet Take 12.5 mg by mouth 2 (two) times daily. 04/09/17   [provider]  dicyclomine (BENTYL) 10 MG capsule Take 10 mg by mouth 2 (two) times daily. 03/29/17   [provider]  furosemide (LASIX) 40 MG tablet Take 40 mg by mouth daily.    [provider]  furosemide (LASIX) 40 MG tablet Take 40 mg by mouth 2 (two) times daily. 03/14/17   [provider]  JOLIVETTE 0.35 MG tablet TK 1 T PO QD 05/19/15   [provider]  Magnesium 200 MG TABS Take 1 tablet by mouth daily.      [provider]  medroxyPROGESTERone (PROVERA) 10 MG tablet Take 10 mg by mouth daily. 04/09/17   [provider]  mycophenolate (MYFORTIC) 360 MG TBEC Take 360 mg by mouth  2 (two) times daily.     [provider]  MYFORTIC 360 MG TBEC EC tablet Take 720 mg by mouth 2 (two) times daily. 03/20/17   [provider]  ondansetron (ZOFRAN) 4 MG tablet TK 1 T PO Q 6 H PRN NV 04/06/15   [provider]  predniSONE (DELTASONE) 5 MG tablet Take 5 mg by mouth daily.      [provider]  predniSONE (DELTASONE) 5 MG tablet Take 5 mg by mouth daily. 03/23/17   [provider]  sertraline (ZOLOFT) 100 MG tablet Take 100 mg by mouth daily. 02/21/17   [provider]  simvastatin (ZOCOR) 5 MG tablet TK 1 T PO QD 05/29/15   [provider]  simvastatin (ZOCOR) 5 MG tablet Take 5 mg by mouth at bedtime. 03/25/17   [provider]  sodium bicarbonate 650 MG tablet Take 1,300 mg by mouth 2 (two) times daily.  03/25/17   [provider]  tacrolimus (PROGRAF) 0.5 MG capsule Take 0.5 mg by mouth 2 (two) times daily. 03/20/17   [provider]  tacrolimus (PROGRAF) 1 MG capsule Take 4 mg by mouth 2 (two) times daily.      [provider]  tacrolimus (PROGRAF) 1 MG capsule Take 1 mg by mouth 2 (two) times daily. 03/20/17   [provider]  traZODone (DESYREL) 50 MG tablet Take 50 mg by mouth at bedtime. 04/09/17   [provider]    Family History Family History  Problem Relation Age of Onset  . Hypertension Mother   . Hyperlipidemia Mother   . Rectal cancer Maternal Grandmother     Social History Social History   Tobacco Use  . Smoking status: Never Smoker  . Smokeless tobacco: Never Used  Substance Use Topics  . Alcohol use: No  . Drug use: No     Allergies   Bactrim [sulfamethoxazole-trimethoprim]; Keflex [cephalexin]; Adhesive [tape]; Bactrim; Imuran [azathioprine sodium]; Tramadol; and Warfarin sodium   Review of Systems Review of Systems  All other systems reviewed and are negative.    Physical Exam Updated Vital Signs BP 127/76 (BP Location: Right Arm)   Pulse 86   Temp 98.5 F (36.9 C) (Oral)   Resp 18   SpO2 99%   Physical Exam  Constitutional: She is oriented to person, place, and time. She appears well-developed and well-nourished.  Non-toxic appearance. No distress.  HENT:  Head: Normocephalic and atraumatic.  Eyes: Conjunctivae, EOM and lids are normal. Pupils are equal, round, and reactive to light.  Neck: Normal range of motion. Neck supple. No tracheal deviation present. No thyroid mass present.  Cardiovascular: Normal rate, regular rhythm and normal heart sounds. Exam reveals no gallop.  No murmur heard. Pulmonary/Chest: Effort normal and breath sounds normal. No stridor. No respiratory distress. She has no decreased breath sounds. She has no wheezes. She has no rhonchi. She has no rales.  Abdominal: Soft. Normal  appearance and bowel sounds are normal. She exhibits no distension. There is no tenderness. There is no rebound and no CVA tenderness.  Musculoskeletal: Normal range of motion. She exhibits no edema or tenderness.  Neurological: She is alert and oriented to person, place, and time. She has normal strength. No cranial nerve deficit or sensory deficit. GCS eye subscore is 4. GCS verbal subscore is 5. GCS motor subscore is 6.  Skin: Skin is warm and dry. No abrasion and no rash noted.  Psychiatric: She has a normal mood and affect.  Her speech is normal and behavior is normal.  Nursing note and vitals reviewed.    ED Treatments / Results  Labs (all labs ordered are listed, but only abnormal results are displayed) Labs Reviewed  CBC - Abnormal; Notable for the following components:      Result Value   WBC 17.6 (*)    Platelets 410 (*)    All other components within normal limits  URINE CULTURE  BASIC METABOLIC PANEL  URINALYSIS, ROUTINE W REFLEX MICROSCOPIC  HEPATIC FUNCTION PANEL  I-STAT TROPONIN, ED    EKG  EKG Interpretation  Date/Time:  Sunday October 20 2017 18:14:01 EST Ventricular Rate:  92 PR Interval:  138 QRS Duration: 70 QT Interval:  358 QTC Calculation: 442 R Axis:   80 Text Interpretation:  Normal sinus rhythm Normal ECG Confirmed by Lacretia Leigh ([id number]) on 10/20/2017 7:00:18 PM       Radiology No results found.  Procedures Procedures (including critical care time)  Medications Ordered in ED Medications - No data to display   Initial Impression / Assessment and Plan / ED Course  I have reviewed the triage vital signs and the nursing notes.  Pertinent labs & imaging results that were available during my care of the patient were reviewed by me and considered in my medical decision making (see chart for details).     Patient given IV hydration and analgesics and feels better.  Monospot negative.  Chest x-ray shows bronchitis.  Patient CO2 is 14 her Is  only 10.  Her creatinine is stable.  Suspect that she was dehydrated.  Will follow with her doctor this month Final Clinical Impressions(s) / ED Diagnoses   Final diagnoses:  None    ED Discharge Orders    None       Lacretia Leigh, MD 10/20/17 2316

## 2017-10-20 NOTE — ED Triage Notes (Signed)
Pt states she has been treated X3 months for flu and pna. Pt states she has taken several abx and has not had any improvement. Pt states she has hx of 2 kidney transplants.

## 2017-10-21 LAB — URINE CULTURE: Culture: NO GROWTH

## 2017-10-30 DIAGNOSIS — N2581 Secondary hyperparathyroidism of renal origin: Secondary | ICD-10-CM | POA: Diagnosis not present

## 2017-10-30 DIAGNOSIS — Z940 Kidney transplant status: Secondary | ICD-10-CM | POA: Diagnosis not present

## 2017-10-30 DIAGNOSIS — D631 Anemia in chronic kidney disease: Secondary | ICD-10-CM | POA: Diagnosis not present

## 2017-10-30 DIAGNOSIS — N189 Chronic kidney disease, unspecified: Secondary | ICD-10-CM | POA: Diagnosis not present

## 2017-10-30 DIAGNOSIS — E872 Acidosis: Secondary | ICD-10-CM | POA: Diagnosis not present

## 2017-10-30 DIAGNOSIS — I10 Essential (primary) hypertension: Secondary | ICD-10-CM | POA: Diagnosis not present

## 2017-11-20 DIAGNOSIS — Z6828 Body mass index (BMI) 28.0-28.9, adult: Secondary | ICD-10-CM | POA: Diagnosis not present

## 2017-11-20 DIAGNOSIS — N390 Urinary tract infection, site not specified: Secondary | ICD-10-CM | POA: Diagnosis not present

## 2017-11-20 DIAGNOSIS — R300 Dysuria: Secondary | ICD-10-CM | POA: Diagnosis not present

## 2017-12-10 ENCOUNTER — Emergency Department (HOSPITAL_COMMUNITY)
Admission: EM | Admit: 2017-12-10 | Discharge: 2017-12-10 | Disposition: A | Discharge status: Home / Self Care | Payer: Medicare Other | Attending: Emergency Medicine | Admitting: Emergency Medicine

## 2017-12-10 ENCOUNTER — Other Ambulatory Visit: Payer: Self-pay

## 2017-12-10 ENCOUNTER — Emergency Department (HOSPITAL_COMMUNITY): Payer: Medicare Other

## 2017-12-10 ENCOUNTER — Encounter (HOSPITAL_COMMUNITY): Payer: Self-pay | Admitting: Emergency Medicine

## 2017-12-10 DIAGNOSIS — R140 Abdominal distension (gaseous): Secondary | ICD-10-CM | POA: Diagnosis not present

## 2017-12-10 DIAGNOSIS — R1901 Right upper quadrant abdominal swelling, mass and lump: Secondary | ICD-10-CM

## 2017-12-10 DIAGNOSIS — Z85828 Personal history of other malignant neoplasm of skin: Secondary | ICD-10-CM | POA: Diagnosis not present

## 2017-12-10 DIAGNOSIS — Z8541 Personal history of malignant neoplasm of cervix uteri: Secondary | ICD-10-CM | POA: Diagnosis not present

## 2017-12-10 DIAGNOSIS — Z87448 Personal history of other diseases of urinary system: Secondary | ICD-10-CM | POA: Diagnosis not present

## 2017-12-10 LAB — CBC WITH DIFFERENTIAL/PLATELET
BASOS ABS: 0 10*3/uL (ref 0.0–0.1)
Basophils Relative: 0 %
EOS ABS: 0 10*3/uL (ref 0.0–0.7)
EOS PCT: 1 %
HCT: 35 % — ABNORMAL LOW (ref 36.0–46.0)
Hemoglobin: 10.9 g/dL — ABNORMAL LOW (ref 12.0–15.0)
Lymphocytes Relative: 15 %
Lymphs Abs: 1 10*3/uL (ref 0.7–4.0)
MCH: 29.6 pg (ref 26.0–34.0)
MCHC: 31.1 g/dL (ref 30.0–36.0)
MCV: 95.1 fL (ref 78.0–100.0)
MONO ABS: 0.3 10*3/uL (ref 0.1–1.0)
Monocytes Relative: 5 %
Neutro Abs: 5.2 10*3/uL (ref 1.7–7.7)
Neutrophils Relative %: 79 %
PLATELETS: 254 10*3/uL (ref 150–400)
RBC: 3.68 MIL/uL — AB (ref 3.87–5.11)
RDW: 14.9 % (ref 11.5–15.5)
WBC: 6.6 10*3/uL (ref 4.0–10.5)

## 2017-12-10 LAB — URINALYSIS, ROUTINE W REFLEX MICROSCOPIC
BILIRUBIN URINE: NEGATIVE
GLUCOSE, UA: NEGATIVE mg/dL
KETONES UR: NEGATIVE mg/dL
Nitrite: NEGATIVE
PH: 5 (ref 5.0–8.0)
PROTEIN: NEGATIVE mg/dL
Specific Gravity, Urine: 1.006 (ref 1.005–1.030)

## 2017-12-10 LAB — COMPREHENSIVE METABOLIC PANEL
ALT: 10 U/L — AB (ref 14–54)
AST: 14 U/L — AB (ref 15–41)
Albumin: 3.5 g/dL (ref 3.5–5.0)
Alkaline Phosphatase: 46 U/L (ref 38–126)
Anion gap: 9 (ref 5–15)
BUN: 13 mg/dL (ref 6–20)
CHLORIDE: 109 mmol/L (ref 101–111)
CO2: 20 mmol/L — AB (ref 22–32)
CREATININE: 1.83 mg/dL — AB (ref 0.44–1.00)
Calcium: 8.3 mg/dL — ABNORMAL LOW (ref 8.9–10.3)
GFR calc non Af Amer: 33 mL/min — ABNORMAL LOW (ref >60)
GFR, EST AFRICAN AMERICAN: 39 mL/min — AB (ref >60)
Glucose, Bld: 91 mg/dL (ref 65–99)
POTASSIUM: 4.1 mmol/L (ref 3.5–5.1)
SODIUM: 138 mmol/L (ref 135–145)
Total Bilirubin: 0.4 mg/dL (ref 0.3–1.2)
Total Protein: 6.2 g/dL — ABNORMAL LOW (ref 6.5–8.1)

## 2017-12-10 LAB — LIPASE, BLOOD: LIPASE: 42 U/L (ref 11–51)

## 2017-12-10 MED ORDER — IOPAMIDOL (ISOVUE-300) INJECTION 61%
INTRAVENOUS | Status: AC
  Filled 2017-12-10: qty 30

## 2017-12-10 NOTE — Discharge Instructions (Signed)
It was my pleasure taking care of you today!   Please call your nephrologist in the morning to schedule a follow up appointment.   Return to ER for fever, vomiting, new or worsening symptoms, any additional concerns.

## 2017-12-10 NOTE — ED Provider Notes (Signed)
[facility] EMERGENCY DEPARTMENT Provider Note   CSN: [id number] Arrival date & time: 12/10/17  1253     History   Chief Complaint Chief Complaint  Patient presents with  . Bloated    HPI Ariel [name] is a 42 y.o. female.  The history is provided by the patient and medical records. No language interpreter was used.   Ariel [name] is a 42 y.o. female  with a PMH of prior kidney transplant who presents to the Emergency Department complaining of intermittent right upper abdominal swelling. Patient reports that she has had intermittent swelling to this area for 1-2 years. She has been seen by her GI doctor, Dr. Lyndel Safe, for this in the past but never told why it occurs. Her most recent visit to GI was in November 2018. She has also been following up with her transplant team in [place] who is aware of this issue and "stumped" on what is causing the swelling. Over the last 4-6 weeks, she feels as if swelling to RUQ and right flank has progressively worsened. Denies any alleviating or aggravating factors. No fever, chills, n/v/d, dysuria, urinary urgency/frequency, cough, congestion, chest pain or shortness of breath.    Past Medical History:  Diagnosis Date  . Anemia   . Cancer ([place])    pre cervical  . 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 History of renal transplant   . Hypercholesterolemia   . 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 Renal failure, chronic    Dialysis in the past  . Vulvar dystrophy     Patient Active Problem List   Diagnosis Date Noted  . Mechanical complication of other vascular device, implant, and graft 09/02/2012  . End stage renal disease ([place]) 09/02/2012  . DVT of axillary vein, acute left ([place]) 08/02/2012  . S/p cadaver renal transplant 08/02/2012  . Hyperlipidemia 08/02/2012    Past Surgical History:  Procedure Laterality Date  .  AV FISTULA PLACEMENT    . INSERTION OF DIALYSIS CATHETER    . [place], 2008  . PARATHYROIDECTOMY  2004   Portion on the parathyroid gland transplanted in R forearm  . REMOVAL OF A DIALYSIS CATHETER    . SIMPLE VULVECTOMY  06/19/2011   Partial simple left  . 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 TUBAL LIGATION       OB History   None      Home Medications    Prior to Admission medications   Medication Sig Start Date End Date Taking? Authorizing Provider  ALPRAZolam Duanne Moron) 0.5 MG tablet Take 1 tablet at bedtime 05/31/15  Yes [provider]  carvedilol (COREG) 12.5 MG tablet Take 12.5 mg by mouth 2 (two) times daily. 04/09/17  Yes [provider]  furosemide (LASIX) 40 MG tablet Take 40 mg by mouth daily.   Yes [provider]  Magnesium 200 MG TABS Take 1 tablet by mouth daily.     Yes [provider]  medroxyPROGESTERone (PROVERA) 10 MG tablet Take 10 mg by mouth daily. 04/09/17  Yes [provider]  MYFORTIC 360 MG TBEC EC tablet Take 720 mg by mouth 2 (two) times daily. 03/20/17  Yes [provider]  ondansetron (ZOFRAN) 4 MG tablet TK 1 T PO Q 6 H PRN  NV 04/06/15  Yes [provider]  pantoprazole (PROTONIX) 40 MG tablet Take 40 mg by mouth daily. 11/07/17  Yes [provider]  Potassium Chloride ER 20 MEQ TBCR Take 20 mEq by mouth daily. 09/07/17  Yes [provider]  predniSONE (DELTASONE) 5 MG tablet Take 5 mg by mouth daily.     Yes [provider]  sertraline (ZOLOFT) 100 MG tablet Take 100 mg by mouth daily. 02/21/17  Yes [provider]  simvastatin (ZOCOR) 5 MG tablet Take 5 mg by mouth at bedtime. 03/25/17  Yes [provider]  tacrolimus (PROGRAF) 0.5 MG capsule Take 0.5 mg by mouth 2 (two) times daily. 03/20/17  Yes [provider]  tacrolimus (PROGRAF) 1 MG capsule Take 1 mg by mouth 2 (two)  times daily. 03/20/17  Yes [provider]  traZODone (DESYREL) 50 MG tablet Take 50 mg by mouth at bedtime. 04/09/17  Yes [provider]    Family History Family History  Problem Relation Age of Onset  . Hypertension Mother   . Hyperlipidemia Mother   . Rectal cancer Maternal Grandmother     Social History Social History   Tobacco Use  . Smoking status: Never Smoker  . Smokeless tobacco: Never Used  Substance Use Topics  . Alcohol use: No  . Drug use: No     Allergies   Bactrim [sulfamethoxazole-trimethoprim]; Azathioprine; Keflex [cephalexin]; Adhesive [tape]; Bactrim; Imuran [azathioprine sodium]; Tramadol; and Warfarin sodium   Review of Systems Review of Systems  Gastrointestinal: Positive for abdominal distention and abdominal pain. Negative for blood in stool, constipation, diarrhea, nausea and vomiting.  All other systems reviewed and are negative.    Physical Exam Updated Vital Signs BP 131/86 (BP Location: Right Arm)   Pulse 74   Temp 98.5 F (36.9 C) (Oral)   Resp 18   Ht 4\' 11"  (1.499 m)   Wt 68 kg (150 lb)   SpO2 100%   BMI 30.30 kg/m   Physical Exam  Constitutional: She is oriented to person, place, and time. She appears well-developed and well-nourished. No distress.  Non-toxic appearing.   HENT:  Head: Normocephalic and atraumatic.  Cardiovascular: Normal rate, regular rhythm and normal heart sounds.  No murmur heard. Pulmonary/Chest: Effort normal and breath sounds normal. No respiratory distress.  Abdominal: Soft. There is no tenderness.  Right upper abdominal fullness. No uniform distention. No tenderness to palpation. No overlying skin changes. No CVA tenderness.  Musculoskeletal: Normal range of motion.  Neurological: She is alert and oriented to person, place, and time.  Skin: Skin is warm and dry.  Nursing note and vitals reviewed.    ED Treatments / Results  Labs (all labs ordered are listed, but only abnormal  results are displayed) Labs Reviewed  CBC WITH DIFFERENTIAL/PLATELET - Abnormal; Notable for the following components:      Result Value   RBC 3.68 (*)    Hemoglobin 10.9 (*)    HCT 35.0 (*)    All other components within normal limits  COMPREHENSIVE METABOLIC PANEL - Abnormal; Notable for the following components:   CO2 20 (*)    Creatinine, Ser 1.83 (*)    Calcium 8.3 (*)    Total Protein 6.2 (*)    AST 14 (*)    ALT 10 (*)    GFR calc non Af Amer 33 (*)    GFR calc Af Amer 39 (*)    All other components within normal limits  URINALYSIS, ROUTINE W  REFLEX MICROSCOPIC - Abnormal; Notable for the following components:   APPearance HAZY (*)    Hgb urine dipstick SMALL (*)    Leukocytes, UA TRACE (*)    Bacteria, UA RARE (*)    Squamous Epithelial / LPF 6-30 (*)    All other components within normal limits  LIPASE, BLOOD    EKG None  Radiology Ct Abdomen Pelvis Wo Contrast  Result Date: 12/10/2017 CLINICAL DATA:  Abdominal distension EXAM: CT ABDOMEN AND PELVIS WITHOUT CONTRAST TECHNIQUE: Multidetector CT imaging of the abdomen and pelvis was performed following the standard protocol without IV contrast. COMPARISON:  09/06/2016 08/20/2016, 01/02/2016 FINDINGS: Lower chest: Lung bases demonstrate no acute consolidation or significant pleural effusion. Trace pericardial effusion. Hepatobiliary: No focal liver abnormality is seen. No gallstones, gallbladder wall thickening, or biliary dilatation. Pancreas: Unremarkable. No pancreatic ductal dilatation or surrounding inflammatory changes. Spleen: Normal in size without focal abnormality. Adrenals/Urinary Tract: Adrenal glands are within normal limits. Atrophic native kidneys. Right lower quadrant transplanted kidney with marked enlargement, measuring approximately 15.2 cm craniocaudad. Transplant is heterogeneous and diffusely edematous but this is a similar finding compared to the previous exams. No hydronephrosis. Considerable edema  surrounding the right lower quadrant transplanted kidney. Stable appearance of the bladder. Stomach/Bowel: The stomach is nonenlarged. No dilated small bowel. Bowel is displaced to the left by the enlarged right lower quadrant transplant. No colon wall thickening. Negative appendix Vascular/Lymphatic: Nonaneurysmal aorta. No significantly enlarged lymph nodes Reproductive: Bilateral tubal occlusive devices.  No adnexal masses Other: Negative for free air. Small amount of ascites in the abdomen and pelvis. Diffuse subcutaneous edema with moderate fluid in the right flank region, slightly increased compared to most recent prior CT. Small amount of left flank edema and fluid. Fluid in the right retroperitoneal space and around the right ileus psoas muscle as before. Edema around the umbilicus. Diffuse skin thickening is present. No change in dystrophic calcified lesion in the left external iliac region. Musculoskeletal: Mild diffuse bony sclerosis. No acute osseous abnormality. IMPRESSION: 1. Atrophic native kidneys with right lower quadrant renal transplant. Similar abnormal appearance of the right renal transplant which is enlarged and edematous with moderate perinephric edema and fluid. No hydronephrosis of the transplanted right lower quadrant kidney. 2. Diffuse skin thickening with subcutaneous edema in the right greater than left abdominal wall fat. Moderate to large fluid in the right flank which appears slightly increased compared to the prior CT. Small amount of left flank fluid is noted. Continued edema and fluid within the right retroperitoneum. 3. Small amount of ascites within the abdomen and pelvis 4. 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 Electronically Signed   By: Donavan Foil M.D.   On: 12/10/2017 17:19    Procedures Procedures (including critical care time)  Medications Ordered in ED Medications  iopamidol (ISOVUE-300) 61 % injection (has no administration in time  range)     Initial Impression / Assessment and Plan / ED Course  I have reviewed the triage vital signs and the nursing notes.  Pertinent labs & imaging results that were available during my care of the patient were reviewed by me and considered in my medical decision making (see chart for details).    Ariel [name] is a 42 y.o. female who presents to ED for intermittent upper abdominal swelling. This has been an ongoing issue for 1-2 years, however she feels as if this has been worsening over the last 4-6 weeks. She has been seen for this in the past  by both her GI doctor and her transplant team. On exam, patient is afebrile, non-toxic appearing, hemodynamically stable with no abdominal tenderness. No overlying skin changes to suggest cellulitis. Labs reviewed and reassuring. Creatinine baseline. UA without signs of infection. No protein in the urine. CT obtained and reviewed with attending, Dr. Rex Kras, showing similar abnormal appearance of the right renal transplant with no hydro. Also shows moderate to large fluid in the right flank slightly increased from prior imaging (09/2016) and continued edema and fluid within the right retroperitoneum. Does not appear to be much change from previous imaging. Repeat exam very much reassuring. Evaluation does not show pathology that would require ongoing emergent intervention or inpatient treatment. Will have her follow up with her nephrologist. Discussed return precautions. All questions answered.   Patient seen by and discussed with Dr. Rex Kras who agrees with treatment plan.    Final Clinical Impressions(s) / ED Diagnoses   Final diagnoses:  Right upper quadrant abdominal swelling    ED Discharge Orders    None       Saul Fabiano, Ozella Almond, PA-C 12/10/17 2334    Little, Wenda Overland, MD 12/11/17 1352

## 2017-12-10 NOTE — ED Triage Notes (Signed)
Pt states she has been having abd bloating for months. Pt has been to GI MD with no answers to why she is bloating. Pt complains of nausea but no vomiting.

## 2017-12-10 NOTE — ED Notes (Signed)
PT states understanding of care given, follow up care. PT ambulated from ED to car with a steady gait.  

## 2017-12-10 NOTE — ED Provider Notes (Signed)
Patient placed in [facility] pathway, seen and evaluated   Chief Complaint: Reports abdominal bloating and upper abdominal pain on and off for two months  HPI:   Pt with hx of kiney transplant, here with abdominal bloating and swelling for several months. Intermittent. Had had extensive evaluation including [place], colonoscopy, edndoscopy. Tried to diet, cut out certain foods. States associated nausea and unable to sleep.  ROS: positive for nausea, abdominal pain, bloating. Negative for vomiting, changes in bowels. Urinary symptoms.   Physical Exam:   Gen: No distress  Neuro: Awake and Alert  Skin: Warm    Focused Exam: abdomen bloated, diffuse tenderness.   Pt with bloating and abd distention for several months. Reviewed [place] from 8/18, concerning for ascites and pericholecystic fluid. Will get labs and ct abd/pelvis.      Initiation of care has begun. The patient has been counseled on the process, plan, and necessity for staying for the completion/evaluation, and the remainder of the medical screening examination    Jeannett Senior, PA-C 12/10/17 [place], [place], DO 12/10/17 1451

## 2017-12-16 DIAGNOSIS — G4700 Insomnia, unspecified: Secondary | ICD-10-CM | POA: Diagnosis not present

## 2017-12-16 DIAGNOSIS — I10 Essential (primary) hypertension: Secondary | ICD-10-CM | POA: Diagnosis not present

## 2017-12-16 DIAGNOSIS — Z6828 Body mass index (BMI) 28.0-28.9, adult: Secondary | ICD-10-CM | POA: Diagnosis not present

## 2017-12-16 DIAGNOSIS — F419 Anxiety disorder, unspecified: Secondary | ICD-10-CM | POA: Diagnosis not present

## 2017-12-16 DIAGNOSIS — N183 Chronic kidney disease, stage 3 (moderate): Secondary | ICD-10-CM | POA: Diagnosis not present

## 2017-12-30 DIAGNOSIS — N184 Chronic kidney disease, stage 4 (severe): Secondary | ICD-10-CM | POA: Diagnosis not present

## 2017-12-30 DIAGNOSIS — D631 Anemia in chronic kidney disease: Secondary | ICD-10-CM | POA: Diagnosis not present

## 2017-12-30 DIAGNOSIS — Z940 Kidney transplant status: Secondary | ICD-10-CM | POA: Diagnosis not present

## 2018-01-07 DIAGNOSIS — H6121 Impacted cerumen, right ear: Secondary | ICD-10-CM | POA: Diagnosis not present

## 2018-01-07 DIAGNOSIS — Z6830 Body mass index (BMI) 30.0-30.9, adult: Secondary | ICD-10-CM | POA: Diagnosis not present

## 2018-01-08 DIAGNOSIS — Z940 Kidney transplant status: Secondary | ICD-10-CM | POA: Diagnosis not present

## 2018-01-08 DIAGNOSIS — Z79899 Other long term (current) drug therapy: Secondary | ICD-10-CM | POA: Diagnosis not present

## 2018-01-10 DIAGNOSIS — N184 Chronic kidney disease, stage 4 (severe): Secondary | ICD-10-CM | POA: Diagnosis not present

## 2018-01-10 DIAGNOSIS — Z79899 Other long term (current) drug therapy: Secondary | ICD-10-CM | POA: Diagnosis not present

## 2018-01-10 DIAGNOSIS — Z940 Kidney transplant status: Secondary | ICD-10-CM | POA: Diagnosis not present

## 2018-01-10 DIAGNOSIS — I10 Essential (primary) hypertension: Secondary | ICD-10-CM | POA: Diagnosis not present

## 2018-01-17 DIAGNOSIS — R110 Nausea: Secondary | ICD-10-CM | POA: Diagnosis not present

## 2018-01-17 DIAGNOSIS — R140 Abdominal distension (gaseous): Secondary | ICD-10-CM | POA: Diagnosis not present

## 2018-01-17 DIAGNOSIS — R1011 Right upper quadrant pain: Secondary | ICD-10-CM | POA: Diagnosis not present

## 2018-01-17 DIAGNOSIS — K6289 Other specified diseases of anus and rectum: Secondary | ICD-10-CM | POA: Diagnosis not present

## 2018-01-23 DIAGNOSIS — R1011 Right upper quadrant pain: Secondary | ICD-10-CM | POA: Diagnosis not present

## 2018-01-23 DIAGNOSIS — R110 Nausea: Secondary | ICD-10-CM | POA: Diagnosis not present

## 2018-01-29 DIAGNOSIS — E785 Hyperlipidemia, unspecified: Secondary | ICD-10-CM | POA: Diagnosis not present

## 2018-01-29 DIAGNOSIS — D631 Anemia in chronic kidney disease: Secondary | ICD-10-CM | POA: Diagnosis not present

## 2018-01-29 DIAGNOSIS — R140 Abdominal distension (gaseous): Secondary | ICD-10-CM | POA: Diagnosis not present

## 2018-01-29 DIAGNOSIS — Z940 Kidney transplant status: Secondary | ICD-10-CM | POA: Diagnosis not present

## 2018-01-29 DIAGNOSIS — N184 Chronic kidney disease, stage 4 (severe): Secondary | ICD-10-CM | POA: Diagnosis not present

## 2018-01-29 DIAGNOSIS — I129 Hypertensive chronic kidney disease with stage 1 through stage 4 chronic kidney disease, or unspecified chronic kidney disease: Secondary | ICD-10-CM | POA: Diagnosis not present

## 2018-01-29 DIAGNOSIS — T82868D Thrombosis of vascular prosthetic devices, implants and grafts, subsequent encounter: Secondary | ICD-10-CM | POA: Diagnosis not present

## 2018-01-29 DIAGNOSIS — R110 Nausea: Secondary | ICD-10-CM | POA: Diagnosis not present

## 2018-01-31 ENCOUNTER — Other Ambulatory Visit: Payer: Self-pay | Admitting: Nephrology

## 2018-01-31 DIAGNOSIS — T148XXA Other injury of unspecified body region, initial encounter: Secondary | ICD-10-CM

## 2018-01-31 DIAGNOSIS — R609 Edema, unspecified: Secondary | ICD-10-CM

## 2018-02-14 DIAGNOSIS — R1084 Generalized abdominal pain: Secondary | ICD-10-CM | POA: Diagnosis not present

## 2018-02-14 DIAGNOSIS — Z9181 History of falling: Secondary | ICD-10-CM | POA: Diagnosis not present

## 2018-02-14 DIAGNOSIS — Z1331 Encounter for screening for depression: Secondary | ICD-10-CM | POA: Diagnosis not present

## 2018-02-14 DIAGNOSIS — F419 Anxiety disorder, unspecified: Secondary | ICD-10-CM | POA: Diagnosis not present

## 2018-02-14 DIAGNOSIS — G4700 Insomnia, unspecified: Secondary | ICD-10-CM | POA: Diagnosis not present

## 2018-02-18 ENCOUNTER — Ambulatory Visit (HOSPITAL_COMMUNITY)
Admission: RE | Admit: 2018-02-18 | Discharge: 2018-02-18 | Disposition: A | Discharge status: Home / Self Care | Payer: Medicare Other | Source: Ambulatory Visit | Attending: Vascular Surgery | Admitting: Vascular Surgery

## 2018-02-18 ENCOUNTER — Other Ambulatory Visit (HOSPITAL_COMMUNITY): Payer: Self-pay | Admitting: Nephrology

## 2018-02-18 ENCOUNTER — Inpatient Hospital Stay: Admission: RE | Admit: 2018-02-18 | Payer: Medicare Other | Source: Ambulatory Visit

## 2018-02-18 DIAGNOSIS — Y832 Surgical operation with anastomosis, bypass or graft as the cause of abnormal reaction of the patient, or of later complication, without mention of misadventure at the time of the procedure: Secondary | ICD-10-CM | POA: Insufficient documentation

## 2018-02-18 DIAGNOSIS — T82868A Thrombosis of vascular prosthetic devices, implants and grafts, initial encounter: Secondary | ICD-10-CM

## 2018-02-19 DIAGNOSIS — E872 Acidosis: Secondary | ICD-10-CM | POA: Diagnosis not present

## 2018-02-19 DIAGNOSIS — N189 Chronic kidney disease, unspecified: Secondary | ICD-10-CM | POA: Diagnosis not present

## 2018-02-19 DIAGNOSIS — Z940 Kidney transplant status: Secondary | ICD-10-CM | POA: Diagnosis not present

## 2018-02-19 DIAGNOSIS — E785 Hyperlipidemia, unspecified: Secondary | ICD-10-CM | POA: Diagnosis not present

## 2018-02-28 DIAGNOSIS — R197 Diarrhea, unspecified: Secondary | ICD-10-CM | POA: Diagnosis not present

## 2018-03-05 DIAGNOSIS — K760 Fatty (change of) liver, not elsewhere classified: Secondary | ICD-10-CM | POA: Diagnosis not present

## 2018-03-05 DIAGNOSIS — R1011 Right upper quadrant pain: Secondary | ICD-10-CM | POA: Diagnosis not present

## 2018-03-05 DIAGNOSIS — R110 Nausea: Secondary | ICD-10-CM | POA: Diagnosis not present

## 2018-03-05 DIAGNOSIS — R140 Abdominal distension (gaseous): Secondary | ICD-10-CM | POA: Diagnosis not present

## 2018-03-18 DIAGNOSIS — Z6829 Body mass index (BMI) 29.0-29.9, adult: Secondary | ICD-10-CM | POA: Diagnosis not present

## 2018-03-18 DIAGNOSIS — R42 Dizziness and giddiness: Secondary | ICD-10-CM | POA: Diagnosis not present

## 2018-03-28 DIAGNOSIS — J209 Acute bronchitis, unspecified: Secondary | ICD-10-CM | POA: Diagnosis not present

## 2018-03-28 DIAGNOSIS — H6123 Impacted cerumen, bilateral: Secondary | ICD-10-CM | POA: Diagnosis not present

## 2018-04-02 DIAGNOSIS — E7800 Pure hypercholesterolemia, unspecified: Secondary | ICD-10-CM | POA: Diagnosis not present

## 2018-04-02 DIAGNOSIS — R531 Weakness: Secondary | ICD-10-CM | POA: Diagnosis not present

## 2018-04-02 DIAGNOSIS — R0602 Shortness of breath: Secondary | ICD-10-CM | POA: Diagnosis not present

## 2018-04-02 DIAGNOSIS — Z940 Kidney transplant status: Secondary | ICD-10-CM | POA: Diagnosis not present

## 2018-04-02 DIAGNOSIS — Z79899 Other long term (current) drug therapy: Secondary | ICD-10-CM | POA: Diagnosis not present

## 2018-04-02 DIAGNOSIS — R188 Other ascites: Secondary | ICD-10-CM | POA: Diagnosis not present

## 2018-04-02 DIAGNOSIS — R1084 Generalized abdominal pain: Secondary | ICD-10-CM | POA: Diagnosis not present

## 2018-04-08 DIAGNOSIS — I129 Hypertensive chronic kidney disease with stage 1 through stage 4 chronic kidney disease, or unspecified chronic kidney disease: Secondary | ICD-10-CM | POA: Diagnosis not present

## 2018-04-08 DIAGNOSIS — N2581 Secondary hyperparathyroidism of renal origin: Secondary | ICD-10-CM | POA: Diagnosis not present

## 2018-04-08 DIAGNOSIS — T82868D Thrombosis of vascular prosthetic devices, implants and grafts, subsequent encounter: Secondary | ICD-10-CM | POA: Diagnosis not present

## 2018-04-08 DIAGNOSIS — D631 Anemia in chronic kidney disease: Secondary | ICD-10-CM | POA: Diagnosis not present

## 2018-04-08 DIAGNOSIS — N184 Chronic kidney disease, stage 4 (severe): Secondary | ICD-10-CM | POA: Diagnosis not present

## 2018-04-08 DIAGNOSIS — Z940 Kidney transplant status: Secondary | ICD-10-CM | POA: Diagnosis not present

## 2018-04-08 DIAGNOSIS — R140 Abdominal distension (gaseous): Secondary | ICD-10-CM | POA: Diagnosis not present

## 2018-04-14 DIAGNOSIS — S61402A Unspecified open wound of left hand, initial encounter: Secondary | ICD-10-CM | POA: Diagnosis not present

## 2018-04-16 DIAGNOSIS — R140 Abdominal distension (gaseous): Secondary | ICD-10-CM

## 2018-04-16 HISTORY — DX: Abdominal distension (gaseous): R14.0

## 2018-04-17 DIAGNOSIS — F419 Anxiety disorder, unspecified: Secondary | ICD-10-CM | POA: Diagnosis not present

## 2018-04-17 DIAGNOSIS — R300 Dysuria: Secondary | ICD-10-CM | POA: Diagnosis not present

## 2018-04-17 DIAGNOSIS — Z6829 Body mass index (BMI) 29.0-29.9, adult: Secondary | ICD-10-CM | POA: Diagnosis not present

## 2018-04-17 DIAGNOSIS — G4700 Insomnia, unspecified: Secondary | ICD-10-CM | POA: Diagnosis not present

## 2018-04-18 DIAGNOSIS — Z940 Kidney transplant status: Secondary | ICD-10-CM | POA: Diagnosis not present

## 2018-04-18 DIAGNOSIS — K648 Other hemorrhoids: Secondary | ICD-10-CM | POA: Diagnosis not present

## 2018-04-18 DIAGNOSIS — R140 Abdominal distension (gaseous): Secondary | ICD-10-CM | POA: Diagnosis not present

## 2018-04-22 DIAGNOSIS — K648 Other hemorrhoids: Secondary | ICD-10-CM

## 2018-04-22 DIAGNOSIS — K6282 Dysplasia of anus: Secondary | ICD-10-CM | POA: Diagnosis not present

## 2018-04-22 DIAGNOSIS — K629 Disease of anus and rectum, unspecified: Secondary | ICD-10-CM

## 2018-04-22 DIAGNOSIS — K644 Residual hemorrhoidal skin tags: Secondary | ICD-10-CM | POA: Diagnosis not present

## 2018-04-22 HISTORY — DX: Other hemorrhoids: K64.8

## 2018-04-22 HISTORY — DX: Disease of anus and rectum, unspecified: K62.9

## 2018-05-15 DIAGNOSIS — J0100 Acute maxillary sinusitis, unspecified: Secondary | ICD-10-CM | POA: Diagnosis not present

## 2018-05-30 DIAGNOSIS — Z940 Kidney transplant status: Secondary | ICD-10-CM | POA: Diagnosis not present

## 2018-05-30 DIAGNOSIS — Z888 Allergy status to other drugs, medicaments and biological substances status: Secondary | ICD-10-CM | POA: Diagnosis not present

## 2018-05-30 DIAGNOSIS — K6282 Dysplasia of anus: Secondary | ICD-10-CM | POA: Diagnosis not present

## 2018-05-30 DIAGNOSIS — D013 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: Secondary | ICD-10-CM | POA: Diagnosis not present

## 2018-05-30 DIAGNOSIS — K649 Unspecified hemorrhoids: Secondary | ICD-10-CM | POA: Diagnosis not present

## 2018-05-30 DIAGNOSIS — F329 Major depressive disorder, single episode, unspecified: Secondary | ICD-10-CM | POA: Diagnosis not present

## 2018-05-30 DIAGNOSIS — N189 Chronic kidney disease, unspecified: Secondary | ICD-10-CM | POA: Diagnosis not present

## 2018-05-30 DIAGNOSIS — K644 Residual hemorrhoidal skin tags: Secondary | ICD-10-CM | POA: Diagnosis not present

## 2018-05-30 DIAGNOSIS — E785 Hyperlipidemia, unspecified: Secondary | ICD-10-CM | POA: Diagnosis not present

## 2018-05-30 DIAGNOSIS — K589 Irritable bowel syndrome without diarrhea: Secondary | ICD-10-CM | POA: Diagnosis not present

## 2018-05-30 DIAGNOSIS — K6289 Other specified diseases of anus and rectum: Secondary | ICD-10-CM | POA: Diagnosis not present

## 2018-05-30 DIAGNOSIS — K219 Gastro-esophageal reflux disease without esophagitis: Secondary | ICD-10-CM | POA: Diagnosis not present

## 2018-05-30 DIAGNOSIS — F419 Anxiety disorder, unspecified: Secondary | ICD-10-CM | POA: Diagnosis not present

## 2018-05-30 DIAGNOSIS — K642 Third degree hemorrhoids: Secondary | ICD-10-CM | POA: Diagnosis not present

## 2018-05-30 DIAGNOSIS — A630 Anogenital (venereal) warts: Secondary | ICD-10-CM | POA: Diagnosis not present

## 2018-05-30 DIAGNOSIS — I129 Hypertensive chronic kidney disease with stage 1 through stage 4 chronic kidney disease, or unspecified chronic kidney disease: Secondary | ICD-10-CM | POA: Diagnosis not present

## 2018-05-30 DIAGNOSIS — K648 Other hemorrhoids: Secondary | ICD-10-CM | POA: Diagnosis not present

## 2018-06-10 DIAGNOSIS — N898 Other specified noninflammatory disorders of vagina: Secondary | ICD-10-CM | POA: Diagnosis not present

## 2018-06-10 DIAGNOSIS — D229 Melanocytic nevi, unspecified: Secondary | ICD-10-CM | POA: Diagnosis not present

## 2018-06-10 DIAGNOSIS — Z7952 Long term (current) use of systemic steroids: Secondary | ICD-10-CM | POA: Diagnosis not present

## 2018-06-10 DIAGNOSIS — Z1231 Encounter for screening mammogram for malignant neoplasm of breast: Secondary | ICD-10-CM | POA: Diagnosis not present

## 2018-06-17 DIAGNOSIS — Z23 Encounter for immunization: Secondary | ICD-10-CM | POA: Diagnosis not present

## 2018-06-17 DIAGNOSIS — F419 Anxiety disorder, unspecified: Secondary | ICD-10-CM | POA: Diagnosis not present

## 2018-06-17 DIAGNOSIS — Z6829 Body mass index (BMI) 29.0-29.9, adult: Secondary | ICD-10-CM | POA: Diagnosis not present

## 2018-06-17 DIAGNOSIS — E663 Overweight: Secondary | ICD-10-CM | POA: Diagnosis not present

## 2018-06-17 DIAGNOSIS — G4700 Insomnia, unspecified: Secondary | ICD-10-CM | POA: Diagnosis not present

## 2018-06-18 DIAGNOSIS — D485 Neoplasm of uncertain behavior of skin: Secondary | ICD-10-CM | POA: Diagnosis not present

## 2018-06-18 DIAGNOSIS — D225 Melanocytic nevi of trunk: Secondary | ICD-10-CM | POA: Diagnosis not present

## 2018-06-26 DIAGNOSIS — L290 Pruritus ani: Secondary | ICD-10-CM

## 2018-06-26 DIAGNOSIS — K6282 Dysplasia of anus: Secondary | ICD-10-CM | POA: Diagnosis not present

## 2018-06-26 DIAGNOSIS — K644 Residual hemorrhoidal skin tags: Secondary | ICD-10-CM | POA: Diagnosis not present

## 2018-06-26 HISTORY — DX: Pruritus ani: L29.0

## 2018-07-03 DIAGNOSIS — M2580 Other specified joint disorders, unspecified joint: Secondary | ICD-10-CM

## 2018-07-03 DIAGNOSIS — M722 Plantar fascial fibromatosis: Secondary | ICD-10-CM

## 2018-07-03 HISTORY — DX: Other specified joint disorders, unspecified joint: M25.80

## 2018-07-03 HISTORY — DX: Plantar fascial fibromatosis: M72.2

## 2018-07-15 DIAGNOSIS — Z940 Kidney transplant status: Secondary | ICD-10-CM | POA: Diagnosis not present

## 2018-07-15 DIAGNOSIS — N189 Chronic kidney disease, unspecified: Secondary | ICD-10-CM | POA: Diagnosis not present

## 2018-07-16 DIAGNOSIS — E785 Hyperlipidemia, unspecified: Secondary | ICD-10-CM | POA: Diagnosis not present

## 2018-07-16 DIAGNOSIS — R140 Abdominal distension (gaseous): Secondary | ICD-10-CM | POA: Diagnosis not present

## 2018-07-16 DIAGNOSIS — D631 Anemia in chronic kidney disease: Secondary | ICD-10-CM | POA: Diagnosis not present

## 2018-07-16 DIAGNOSIS — N2581 Secondary hyperparathyroidism of renal origin: Secondary | ICD-10-CM | POA: Diagnosis not present

## 2018-07-16 DIAGNOSIS — I10 Essential (primary) hypertension: Secondary | ICD-10-CM | POA: Diagnosis not present

## 2018-07-16 DIAGNOSIS — N189 Chronic kidney disease, unspecified: Secondary | ICD-10-CM | POA: Diagnosis not present

## 2018-07-16 DIAGNOSIS — Z940 Kidney transplant status: Secondary | ICD-10-CM | POA: Diagnosis not present

## 2018-07-17 DIAGNOSIS — Z6830 Body mass index (BMI) 30.0-30.9, adult: Secondary | ICD-10-CM | POA: Diagnosis not present

## 2018-07-17 DIAGNOSIS — Z0000 Encounter for general adult medical examination without abnormal findings: Secondary | ICD-10-CM | POA: Diagnosis not present

## 2018-07-17 DIAGNOSIS — Z136 Encounter for screening for cardiovascular disorders: Secondary | ICD-10-CM | POA: Diagnosis not present

## 2018-07-17 DIAGNOSIS — E785 Hyperlipidemia, unspecified: Secondary | ICD-10-CM | POA: Diagnosis not present

## 2018-07-17 DIAGNOSIS — Z23 Encounter for immunization: Secondary | ICD-10-CM | POA: Diagnosis not present

## 2018-07-17 DIAGNOSIS — Z1239 Encounter for other screening for malignant neoplasm of breast: Secondary | ICD-10-CM | POA: Diagnosis not present

## 2018-07-17 DIAGNOSIS — Z1339 Encounter for screening examination for other mental health and behavioral disorders: Secondary | ICD-10-CM | POA: Diagnosis not present

## 2018-07-17 DIAGNOSIS — E669 Obesity, unspecified: Secondary | ICD-10-CM | POA: Diagnosis not present

## 2018-07-23 DIAGNOSIS — J0100 Acute maxillary sinusitis, unspecified: Secondary | ICD-10-CM | POA: Diagnosis not present

## 2018-07-23 DIAGNOSIS — H6691 Otitis media, unspecified, right ear: Secondary | ICD-10-CM | POA: Diagnosis not present

## 2018-07-29 DIAGNOSIS — Z6831 Body mass index (BMI) 31.0-31.9, adult: Secondary | ICD-10-CM | POA: Diagnosis not present

## 2018-07-29 DIAGNOSIS — J069 Acute upper respiratory infection, unspecified: Secondary | ICD-10-CM | POA: Diagnosis not present

## 2018-08-07 DIAGNOSIS — R05 Cough: Secondary | ICD-10-CM | POA: Diagnosis not present

## 2018-08-07 DIAGNOSIS — J069 Acute upper respiratory infection, unspecified: Secondary | ICD-10-CM | POA: Diagnosis not present

## 2018-08-07 DIAGNOSIS — N189 Chronic kidney disease, unspecified: Secondary | ICD-10-CM | POA: Diagnosis not present

## 2018-08-07 DIAGNOSIS — D631 Anemia in chronic kidney disease: Secondary | ICD-10-CM | POA: Diagnosis not present

## 2018-08-14 DIAGNOSIS — L290 Pruritus ani: Secondary | ICD-10-CM | POA: Diagnosis not present

## 2018-08-14 DIAGNOSIS — B372 Candidiasis of skin and nail: Secondary | ICD-10-CM

## 2018-08-14 DIAGNOSIS — K6282 Dysplasia of anus: Secondary | ICD-10-CM | POA: Diagnosis not present

## 2018-08-14 DIAGNOSIS — K641 Second degree hemorrhoids: Secondary | ICD-10-CM | POA: Diagnosis not present

## 2018-08-14 DIAGNOSIS — K644 Residual hemorrhoidal skin tags: Secondary | ICD-10-CM | POA: Diagnosis not present

## 2018-08-14 HISTORY — DX: Candidiasis of skin and nail: B37.2

## 2018-08-15 DIAGNOSIS — N189 Chronic kidney disease, unspecified: Secondary | ICD-10-CM | POA: Diagnosis not present

## 2018-08-15 DIAGNOSIS — D631 Anemia in chronic kidney disease: Secondary | ICD-10-CM | POA: Diagnosis not present

## 2018-08-18 DIAGNOSIS — Z6830 Body mass index (BMI) 30.0-30.9, adult: Secondary | ICD-10-CM | POA: Diagnosis not present

## 2018-08-18 DIAGNOSIS — F419 Anxiety disorder, unspecified: Secondary | ICD-10-CM | POA: Diagnosis not present

## 2018-08-18 DIAGNOSIS — R1084 Generalized abdominal pain: Secondary | ICD-10-CM | POA: Diagnosis not present

## 2018-08-18 DIAGNOSIS — G4700 Insomnia, unspecified: Secondary | ICD-10-CM | POA: Diagnosis not present

## 2018-08-22 DIAGNOSIS — Z13820 Encounter for screening for osteoporosis: Secondary | ICD-10-CM | POA: Diagnosis not present

## 2018-08-22 DIAGNOSIS — Z1231 Encounter for screening mammogram for malignant neoplasm of breast: Secondary | ICD-10-CM | POA: Diagnosis not present

## 2018-08-22 DIAGNOSIS — Z7952 Long term (current) use of systemic steroids: Secondary | ICD-10-CM | POA: Diagnosis not present

## 2018-09-03 HISTORY — PX: KNEE SURGERY: SHX244

## 2018-09-12 DIAGNOSIS — J209 Acute bronchitis, unspecified: Secondary | ICD-10-CM | POA: Diagnosis not present

## 2018-09-15 DIAGNOSIS — N189 Chronic kidney disease, unspecified: Secondary | ICD-10-CM | POA: Diagnosis not present

## 2018-09-18 DIAGNOSIS — R922 Inconclusive mammogram: Secondary | ICD-10-CM | POA: Diagnosis not present

## 2018-09-18 DIAGNOSIS — N6312 Unspecified lump in the right breast, upper inner quadrant: Secondary | ICD-10-CM | POA: Diagnosis not present

## 2018-09-22 DIAGNOSIS — N6489 Other specified disorders of breast: Secondary | ICD-10-CM | POA: Diagnosis not present

## 2018-09-22 DIAGNOSIS — N62 Hypertrophy of breast: Secondary | ICD-10-CM | POA: Diagnosis not present

## 2018-09-22 DIAGNOSIS — R928 Other abnormal and inconclusive findings on diagnostic imaging of breast: Secondary | ICD-10-CM | POA: Diagnosis not present

## 2018-09-22 DIAGNOSIS — N6312 Unspecified lump in the right breast, upper inner quadrant: Secondary | ICD-10-CM | POA: Diagnosis not present

## 2018-09-25 DIAGNOSIS — Z940 Kidney transplant status: Secondary | ICD-10-CM | POA: Diagnosis not present

## 2018-09-25 DIAGNOSIS — Z79899 Other long term (current) drug therapy: Secondary | ICD-10-CM | POA: Diagnosis not present

## 2018-09-25 DIAGNOSIS — R799 Abnormal finding of blood chemistry, unspecified: Secondary | ICD-10-CM | POA: Diagnosis not present

## 2018-09-29 DIAGNOSIS — E872 Acidosis: Secondary | ICD-10-CM | POA: Diagnosis not present

## 2018-09-29 DIAGNOSIS — J329 Chronic sinusitis, unspecified: Secondary | ICD-10-CM | POA: Diagnosis not present

## 2018-09-29 DIAGNOSIS — Z79899 Other long term (current) drug therapy: Secondary | ICD-10-CM | POA: Diagnosis not present

## 2018-09-29 DIAGNOSIS — Z940 Kidney transplant status: Secondary | ICD-10-CM | POA: Diagnosis not present

## 2018-10-05 DIAGNOSIS — R0789 Other chest pain: Secondary | ICD-10-CM | POA: Diagnosis not present

## 2018-10-05 DIAGNOSIS — M542 Cervicalgia: Secondary | ICD-10-CM | POA: Diagnosis not present

## 2018-10-06 DIAGNOSIS — H5213 Myopia, bilateral: Secondary | ICD-10-CM | POA: Diagnosis not present

## 2018-10-06 DIAGNOSIS — Z79899 Other long term (current) drug therapy: Secondary | ICD-10-CM | POA: Diagnosis not present

## 2018-10-08 DIAGNOSIS — J329 Chronic sinusitis, unspecified: Secondary | ICD-10-CM | POA: Diagnosis not present

## 2018-10-08 DIAGNOSIS — J019 Acute sinusitis, unspecified: Secondary | ICD-10-CM | POA: Diagnosis not present

## 2018-10-20 DIAGNOSIS — F419 Anxiety disorder, unspecified: Secondary | ICD-10-CM | POA: Diagnosis not present

## 2018-10-20 DIAGNOSIS — G4700 Insomnia, unspecified: Secondary | ICD-10-CM | POA: Diagnosis not present

## 2018-10-20 DIAGNOSIS — Z6831 Body mass index (BMI) 31.0-31.9, adult: Secondary | ICD-10-CM | POA: Diagnosis not present

## 2018-10-20 DIAGNOSIS — F339 Major depressive disorder, recurrent, unspecified: Secondary | ICD-10-CM | POA: Diagnosis not present

## 2018-10-27 DIAGNOSIS — J324 Chronic pansinusitis: Secondary | ICD-10-CM | POA: Diagnosis not present

## 2018-10-27 DIAGNOSIS — J321 Chronic frontal sinusitis: Secondary | ICD-10-CM | POA: Diagnosis not present

## 2018-10-27 DIAGNOSIS — J320 Chronic maxillary sinusitis: Secondary | ICD-10-CM | POA: Diagnosis not present

## 2018-10-27 DIAGNOSIS — J323 Chronic sphenoidal sinusitis: Secondary | ICD-10-CM | POA: Diagnosis not present

## 2018-10-27 DIAGNOSIS — J343 Hypertrophy of nasal turbinates: Secondary | ICD-10-CM | POA: Diagnosis not present

## 2018-10-27 DIAGNOSIS — R0981 Nasal congestion: Secondary | ICD-10-CM | POA: Diagnosis not present

## 2018-10-27 DIAGNOSIS — N189 Chronic kidney disease, unspecified: Secondary | ICD-10-CM | POA: Diagnosis not present

## 2018-10-27 DIAGNOSIS — R51 Headache: Secondary | ICD-10-CM | POA: Diagnosis not present

## 2018-10-27 DIAGNOSIS — J342 Deviated nasal septum: Secondary | ICD-10-CM | POA: Diagnosis not present

## 2018-10-27 DIAGNOSIS — H6121 Impacted cerumen, right ear: Secondary | ICD-10-CM | POA: Diagnosis not present

## 2018-10-27 DIAGNOSIS — J3489 Other specified disorders of nose and nasal sinuses: Secondary | ICD-10-CM | POA: Diagnosis not present

## 2018-10-27 DIAGNOSIS — J322 Chronic ethmoidal sinusitis: Secondary | ICD-10-CM | POA: Diagnosis not present

## 2018-10-29 DIAGNOSIS — F419 Anxiety disorder, unspecified: Secondary | ICD-10-CM

## 2018-10-29 DIAGNOSIS — J328 Other chronic sinusitis: Secondary | ICD-10-CM

## 2018-10-29 DIAGNOSIS — T8612 Kidney transplant failure: Secondary | ICD-10-CM

## 2018-10-29 DIAGNOSIS — Z9089 Acquired absence of other organs: Secondary | ICD-10-CM

## 2018-10-29 DIAGNOSIS — Z79899 Other long term (current) drug therapy: Secondary | ICD-10-CM

## 2018-10-29 DIAGNOSIS — Z940 Kidney transplant status: Secondary | ICD-10-CM | POA: Insufficient documentation

## 2018-10-29 DIAGNOSIS — Z9885 Transplanted organ removal status: Secondary | ICD-10-CM

## 2018-10-29 DIAGNOSIS — R140 Abdominal distension (gaseous): Secondary | ICD-10-CM | POA: Diagnosis not present

## 2018-10-29 DIAGNOSIS — I151 Hypertension secondary to other renal disorders: Secondary | ICD-10-CM

## 2018-10-29 DIAGNOSIS — E892 Postprocedural hypoparathyroidism: Secondary | ICD-10-CM

## 2018-10-29 DIAGNOSIS — Z8719 Personal history of other diseases of the digestive system: Secondary | ICD-10-CM | POA: Diagnosis not present

## 2018-10-29 DIAGNOSIS — R1031 Right lower quadrant pain: Secondary | ICD-10-CM

## 2018-10-29 DIAGNOSIS — Z9009 Acquired absence of other part of head and neck: Secondary | ICD-10-CM | POA: Insufficient documentation

## 2018-10-29 HISTORY — DX: Right lower quadrant pain: R10.31

## 2018-10-29 HISTORY — DX: Acquired absence of other organs: Z90.89

## 2018-10-29 HISTORY — DX: Kidney transplant status: Z94.0

## 2018-10-29 HISTORY — DX: Transplanted organ removal status: Z98.85

## 2018-10-29 HISTORY — DX: Other long term (current) drug therapy: Z79.899

## 2018-10-29 HISTORY — DX: Kidney transplant failure: T86.12

## 2018-10-29 HISTORY — DX: Hypertension secondary to other renal disorders: I15.1

## 2018-10-29 HISTORY — DX: Anxiety disorder, unspecified: F41.9

## 2018-10-29 HISTORY — DX: Postprocedural hypoparathyroidism: E89.2

## 2018-10-29 HISTORY — DX: Other chronic sinusitis: J32.8

## 2018-11-19 DIAGNOSIS — H1132 Conjunctival hemorrhage, left eye: Secondary | ICD-10-CM | POA: Diagnosis not present

## 2018-11-27 DIAGNOSIS — F419 Anxiety disorder, unspecified: Secondary | ICD-10-CM | POA: Diagnosis not present

## 2018-11-27 DIAGNOSIS — I10 Essential (primary) hypertension: Secondary | ICD-10-CM | POA: Diagnosis not present

## 2018-11-27 DIAGNOSIS — R140 Abdominal distension (gaseous): Secondary | ICD-10-CM | POA: Diagnosis not present

## 2018-11-27 DIAGNOSIS — Z940 Kidney transplant status: Secondary | ICD-10-CM | POA: Diagnosis not present

## 2018-11-27 DIAGNOSIS — E785 Hyperlipidemia, unspecified: Secondary | ICD-10-CM | POA: Diagnosis not present

## 2018-11-27 DIAGNOSIS — N2581 Secondary hyperparathyroidism of renal origin: Secondary | ICD-10-CM | POA: Diagnosis not present

## 2018-12-04 DIAGNOSIS — R1084 Generalized abdominal pain: Secondary | ICD-10-CM | POA: Diagnosis not present

## 2018-12-04 DIAGNOSIS — J329 Chronic sinusitis, unspecified: Secondary | ICD-10-CM | POA: Diagnosis not present

## 2018-12-04 DIAGNOSIS — Z6832 Body mass index (BMI) 32.0-32.9, adult: Secondary | ICD-10-CM | POA: Diagnosis not present

## 2018-12-09 DIAGNOSIS — F339 Major depressive disorder, recurrent, unspecified: Secondary | ICD-10-CM | POA: Diagnosis not present

## 2018-12-09 DIAGNOSIS — G4700 Insomnia, unspecified: Secondary | ICD-10-CM | POA: Diagnosis not present

## 2018-12-09 DIAGNOSIS — Z6831 Body mass index (BMI) 31.0-31.9, adult: Secondary | ICD-10-CM | POA: Diagnosis not present

## 2018-12-09 DIAGNOSIS — F419 Anxiety disorder, unspecified: Secondary | ICD-10-CM | POA: Diagnosis not present

## 2019-01-02 DIAGNOSIS — Z940 Kidney transplant status: Secondary | ICD-10-CM | POA: Diagnosis not present

## 2019-01-02 DIAGNOSIS — N390 Urinary tract infection, site not specified: Secondary | ICD-10-CM | POA: Diagnosis not present

## 2019-01-02 DIAGNOSIS — N189 Chronic kidney disease, unspecified: Secondary | ICD-10-CM | POA: Diagnosis not present

## 2019-01-02 DIAGNOSIS — E876 Hypokalemia: Secondary | ICD-10-CM | POA: Diagnosis not present

## 2019-01-16 DIAGNOSIS — M722 Plantar fascial fibromatosis: Secondary | ICD-10-CM | POA: Diagnosis not present

## 2019-01-19 ENCOUNTER — Other Ambulatory Visit: Payer: Self-pay

## 2019-01-19 DIAGNOSIS — T50905A Adverse effect of unspecified drugs, medicaments and biological substances, initial encounter: Secondary | ICD-10-CM

## 2019-01-19 DIAGNOSIS — I10 Essential (primary) hypertension: Secondary | ICD-10-CM

## 2019-01-19 DIAGNOSIS — D68318 Other hemorrhagic disorder due to intrinsic circulating anticoagulants, antibodies, or inhibitors: Secondary | ICD-10-CM | POA: Insufficient documentation

## 2019-01-19 DIAGNOSIS — I200 Unstable angina: Secondary | ICD-10-CM

## 2019-01-19 DIAGNOSIS — R011 Cardiac murmur, unspecified: Secondary | ICD-10-CM

## 2019-01-19 DIAGNOSIS — E860 Dehydration: Secondary | ICD-10-CM | POA: Insufficient documentation

## 2019-01-19 DIAGNOSIS — R0609 Other forms of dyspnea: Secondary | ICD-10-CM

## 2019-01-19 DIAGNOSIS — R112 Nausea with vomiting, unspecified: Secondary | ICD-10-CM | POA: Insufficient documentation

## 2019-01-19 DIAGNOSIS — E039 Hypothyroidism, unspecified: Secondary | ICD-10-CM

## 2019-01-19 DIAGNOSIS — D699 Hemorrhagic condition, unspecified: Secondary | ICD-10-CM

## 2019-01-19 DIAGNOSIS — D649 Anemia, unspecified: Secondary | ICD-10-CM | POA: Insufficient documentation

## 2019-01-19 HISTORY — DX: Hemorrhagic condition, unspecified: D69.9

## 2019-01-19 HISTORY — DX: Essential (primary) hypertension: I10

## 2019-01-19 HISTORY — DX: Other forms of dyspnea: R06.09

## 2019-01-19 HISTORY — DX: Unstable angina: I20.0

## 2019-01-19 HISTORY — DX: Dehydration: E86.0

## 2019-01-19 HISTORY — DX: Cardiac murmur, unspecified: R01.1

## 2019-01-19 HISTORY — DX: Hypothyroidism, unspecified: E03.9

## 2019-01-19 HISTORY — DX: Nausea with vomiting, unspecified: R11.2

## 2019-01-20 ENCOUNTER — Ambulatory Visit (INDEPENDENT_AMBULATORY_CARE_PROVIDER_SITE_OTHER): Payer: Medicare Other

## 2019-01-20 ENCOUNTER — Other Ambulatory Visit: Payer: Self-pay

## 2019-01-20 ENCOUNTER — Ambulatory Visit (INDEPENDENT_AMBULATORY_CARE_PROVIDER_SITE_OTHER): Payer: Medicare Other | Admitting: Podiatry

## 2019-01-20 ENCOUNTER — Encounter: Payer: Self-pay | Admitting: Podiatry

## 2019-01-20 VITALS — BP 137/78 | HR 71 | Temp 96.7°F | Resp 16 | Ht 59.0 in | Wt 161.0 lb

## 2019-01-20 DIAGNOSIS — M722 Plantar fascial fibromatosis: Secondary | ICD-10-CM

## 2019-01-20 DIAGNOSIS — R600 Localized edema: Secondary | ICD-10-CM | POA: Diagnosis not present

## 2019-01-20 DIAGNOSIS — M778 Other enthesopathies, not elsewhere classified: Secondary | ICD-10-CM

## 2019-01-20 DIAGNOSIS — M779 Enthesopathy, unspecified: Secondary | ICD-10-CM

## 2019-01-20 NOTE — Progress Notes (Signed)
   Subjective:    Patient ID: Ariel [name], female    DOB: February 13, 1976, 43 y.o.   MRN: [id number]  HPI    Review of Systems  Musculoskeletal: Positive for arthralgias, joint swelling and myalgias.  All other systems reviewed and are negative.      Objective:   Physical Exam        Assessment & Plan:

## 2019-01-27 ENCOUNTER — Other Ambulatory Visit: Payer: Self-pay | Admitting: Podiatry

## 2019-01-27 DIAGNOSIS — M79671 Pain in right foot: Secondary | ICD-10-CM

## 2019-01-27 DIAGNOSIS — M722 Plantar fascial fibromatosis: Secondary | ICD-10-CM

## 2019-01-27 NOTE — Progress Notes (Signed)
Subjective:  Patient ID: Ariel [name], female    DOB: 08-Mar-1976,  MRN: [id number]  Chief Complaint  Patient presents with  . Foot Pain    Rt arhc pain radiates up to plantar forefoot (1-5th) x Thursday; 7/10 shpar burning pain - no injury -pt states she went to ER Friday and was told it was PF and was Rx Oxy 5 mo Tx: cam boot and ace wrap -pt alos c/o Rt hallux joint pain and swelling     43 y.o. female presents with the above complaint.  History above confirmed with patient   Review of Systems: Negative except as noted in the HPI. Denies N/V/F/Ch.  Past Medical History:  Diagnosis Date  . Anemia   . Cancer ([place])    pre cervical  . 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 History of renal transplant   . Hypercholesterolemia   . 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 Renal failure, chronic    Dialysis in the past  . Vulvar dystrophy     Current Outpatient Medications:  .  ALPRAZolam (XANAX) 0.5 MG tablet, Take 1 tablet at bedtime, Disp: , Rfl: 1 .  carvedilol (COREG) 12.5 MG tablet, Take 12.5 mg by mouth 2 (two) times daily., Disp: , Rfl: 3 .  furosemide (LASIX) 40 MG tablet, Take 40 mg by mouth daily., Disp: , Rfl:  .  Magnesium 200 MG TABS, Take 1 tablet by mouth daily.  , Disp: , Rfl:  .  medroxyPROGESTERone (PROVERA) 10 MG tablet, Take 10 mg by mouth daily., Disp: , Rfl: 3 .  Potassium Chloride ER 20 MEQ TBCR, Take 20 mEq by mouth daily., Disp: , Rfl: 6 .  predniSONE (DELTASONE) 5 MG tablet, Take 5 mg by mouth daily.  , Disp: , Rfl:  .  sertraline (ZOLOFT) 100 MG tablet, Take 100 mg by mouth daily., Disp: , Rfl: 5 .  tacrolimus (PROGRAF) 0.5 MG capsule, Take 0.5 mg by mouth 2 (two) times daily., Disp: , Rfl: 6 .  tacrolimus (PROGRAF) 1 MG capsule, Take 1 mg by mouth 2 (two) times daily., Disp: , Rfl: 6  Social History   Tobacco Use  Smoking Status Never Smoker  Smokeless  Tobacco Never Used    Allergies  Allergen Reactions  . Bactrim [Sulfamethoxazole-Trimethoprim] Anaphylaxis  . Sulfamethoxazole   . Other Hives and Rash  . Azathioprine Itching  . Keflex [Cephalexin] Swelling    Per pt, her face and lips swell up and she develops hives  . Adhesive [Tape] Rash  . Bactrim Swelling and Rash  . Imuran [Azathioprine Sodium] Rash  . Tramadol Rash  . Warfarin Sodium Rash   Objective:   Vitals:   01/20/19 1434  BP: 137/78  Pulse: 71  Resp: 16  Temp: (!) 96.7 F (35.9 C)   Body mass index is 32.52 kg/m. Constitutional Well developed. Well nourished.  Vascular Dorsalis pedis pulses palpable bilaterally. Posterior tibial pulses palpable bilaterally. Capillary refill normal to all digits.  No cyanosis or clubbing noted. Pedal hair growth normal.  Neurologic Normal speech. Oriented to person, place, and time. Epicritic sensation to light touch grossly present bilaterally.  Dermatologic Nails well groomed and normal in appearance. No open wounds. No skin lesions.  Orthopedic: Normal joint ROM without pain or crepitus bilaterally. Edema right forefoot, no warmth or erythema No visible deformities.   Radiographs: X-rays taken  and reviewed no acute fractures dislocations Assessment:   1. Localized edema   2. Capsulitis of foot, right    Plan:  Patient was evaluated and treated and all questions answered.  Right foot edema, likely inflammatory capsulitis -Unna boot applied from the foot to the ankle -Discussed rest icing -Follow-up in 2 weeks for recheck  No follow-ups on file.

## 2019-02-02 ENCOUNTER — Encounter: Payer: Self-pay | Admitting: Podiatry

## 2019-02-02 ENCOUNTER — Other Ambulatory Visit: Payer: Self-pay

## 2019-02-02 ENCOUNTER — Ambulatory Visit (INDEPENDENT_AMBULATORY_CARE_PROVIDER_SITE_OTHER): Payer: Medicare Other | Admitting: Podiatry

## 2019-02-02 VITALS — Temp 96.4°F | Resp 16

## 2019-02-02 DIAGNOSIS — M778 Other enthesopathies, not elsewhere classified: Secondary | ICD-10-CM

## 2019-02-02 DIAGNOSIS — M779 Enthesopathy, unspecified: Secondary | ICD-10-CM | POA: Diagnosis not present

## 2019-02-02 NOTE — Progress Notes (Signed)
Subjective:  Patient ID: Ariel [name], female    DOB: [birth year],  MRN: [id number]  Chief Complaint  Patient presents with  . capsulitis    F/U Rt edema and capsulitis Pt. states," lot better, sometimes I have a pulling sensation at my big toe's joint area; 1/10 soreness Tx: elevation -pt deneis N/V/F?Ch     43 y.o. female presents with the above complaint.  History above confirmed with patient. Doing much better no pain today.  Review of Systems: Negative except as noted in the HPI. Denies N/V/F/Ch.  Past Medical History:  Diagnosis Date  . Anemia   . Cancer ([place])    pre cervical  . 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 History of renal transplant   . Hypercholesterolemia   . 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 Renal failure, chronic    Dialysis in the past  . Vulvar dystrophy     Current Outpatient Medications:  .  ALPRAZolam (XANAX) 0.5 MG tablet, Take 1 tablet at bedtime, Disp: , Rfl: 1 .  carvedilol (COREG) 12.5 MG tablet, Take 12.5 mg by mouth 2 (two) times daily., Disp: , Rfl: 3 .  furosemide (LASIX) 40 MG tablet, Take 40 mg by mouth daily., Disp: , Rfl:  .  Magnesium 200 MG TABS, Take 1 tablet by mouth daily.  , Disp: , Rfl:  .  medroxyPROGESTERone (PROVERA) 10 MG tablet, Take 10 mg by mouth daily., Disp: , Rfl: 3 .  oxyCODONE (OXY IR/ROXICODONE) 5 MG immediate release tablet, TK 1 T PO Q 6 H PRF PAIN, Disp: , Rfl:  .  Potassium Chloride ER 20 MEQ TBCR, Take 20 mEq by mouth daily., Disp: , Rfl: 6 .  predniSONE (DELTASONE) 5 MG tablet, Take 5 mg by mouth daily.  , Disp: , Rfl:  .  sertraline (ZOLOFT) 100 MG tablet, Take 100 mg by mouth daily., Disp: , Rfl: 5 .  tacrolimus (PROGRAF) 0.5 MG capsule, Take 0.5 mg by mouth 2 (two) times daily., Disp: , Rfl: 6 .  tacrolimus (PROGRAF) 1 MG capsule, Take 1 mg by mouth 2 (two) times daily., Disp: , Rfl: 6  Social History    Tobacco Use  Smoking Status Never Smoker  Smokeless Tobacco Never Used    Allergies  Allergen Reactions  . Bactrim [Sulfamethoxazole-Trimethoprim] Anaphylaxis  . Sulfamethoxazole   . Other Hives and Rash  . Azathioprine Itching  . Keflex [Cephalexin] Swelling    Per pt, her face and lips swell up and she develops hives  . Adhesive [Tape] Rash  . Bactrim Swelling and Rash  . Imuran [Azathioprine Sodium] Rash  . Tramadol Rash  . Warfarin Sodium Rash   Objective:   Vitals:   02/02/19 1353  Resp: 16  Temp: (!) 96.4 F (35.8 C)   There is no height or weight on file to calculate BMI. Constitutional Well developed. Well nourished.  Vascular Dorsalis pedis pulses palpable bilaterally. Posterior tibial pulses palpable bilaterally. Capillary refill normal to all digits.  No cyanosis or clubbing noted. Pedal hair growth normal.  Neurologic Normal speech. Oriented to person, place, and time. Epicritic sensation to light touch grossly present bilaterally.  Dermatologic Nails well groomed and normal in appearance. No open wounds. No skin lesions.  Orthopedic: Normal joint ROM without pain or crepitus bilaterally. Edema resolved no pain to palpation. No visible deformities.   Radiographs: None  Assessment:   1. Capsulitis of foot, right    Plan:  Patient was evaluated and treated and all questions answered.  Inflammatory capsulitis, now resolved -Appears resolved -Discussed signs of recurrence -F/u as needed  No follow-ups on file.

## 2019-02-09 DIAGNOSIS — Z940 Kidney transplant status: Secondary | ICD-10-CM | POA: Diagnosis not present

## 2019-02-09 DIAGNOSIS — Z79899 Other long term (current) drug therapy: Secondary | ICD-10-CM | POA: Diagnosis not present

## 2019-02-09 DIAGNOSIS — R799 Abnormal finding of blood chemistry, unspecified: Secondary | ICD-10-CM | POA: Diagnosis not present

## 2019-02-11 DIAGNOSIS — Z940 Kidney transplant status: Secondary | ICD-10-CM | POA: Diagnosis not present

## 2019-02-11 DIAGNOSIS — R1900 Intra-abdominal and pelvic swelling, mass and lump, unspecified site: Secondary | ICD-10-CM | POA: Diagnosis not present

## 2019-02-11 DIAGNOSIS — J329 Chronic sinusitis, unspecified: Secondary | ICD-10-CM | POA: Diagnosis not present

## 2019-02-11 DIAGNOSIS — Z79899 Other long term (current) drug therapy: Secondary | ICD-10-CM | POA: Diagnosis not present

## 2019-02-17 DIAGNOSIS — J324 Chronic pansinusitis: Secondary | ICD-10-CM | POA: Diagnosis not present

## 2019-02-17 DIAGNOSIS — J3489 Other specified disorders of nose and nasal sinuses: Secondary | ICD-10-CM | POA: Diagnosis not present

## 2019-02-17 DIAGNOSIS — J342 Deviated nasal septum: Secondary | ICD-10-CM | POA: Diagnosis not present

## 2019-02-17 DIAGNOSIS — J343 Hypertrophy of nasal turbinates: Secondary | ICD-10-CM | POA: Diagnosis not present

## 2019-02-18 DIAGNOSIS — F419 Anxiety disorder, unspecified: Secondary | ICD-10-CM | POA: Diagnosis not present

## 2019-02-18 DIAGNOSIS — G4700 Insomnia, unspecified: Secondary | ICD-10-CM | POA: Diagnosis not present

## 2019-02-18 DIAGNOSIS — F339 Major depressive disorder, recurrent, unspecified: Secondary | ICD-10-CM | POA: Diagnosis not present

## 2019-02-24 ENCOUNTER — Ambulatory Visit (INDEPENDENT_AMBULATORY_CARE_PROVIDER_SITE_OTHER): Payer: Medicare Other | Admitting: Podiatry

## 2019-02-24 ENCOUNTER — Other Ambulatory Visit: Payer: Self-pay

## 2019-02-24 ENCOUNTER — Ambulatory Visit (INDEPENDENT_AMBULATORY_CARE_PROVIDER_SITE_OTHER): Payer: Medicare Other

## 2019-02-24 ENCOUNTER — Encounter: Payer: Self-pay | Admitting: Podiatry

## 2019-02-24 VITALS — Temp 96.9°F | Resp 16

## 2019-02-24 DIAGNOSIS — M7751 Other enthesopathy of right foot: Secondary | ICD-10-CM | POA: Diagnosis not present

## 2019-02-24 DIAGNOSIS — N390 Urinary tract infection, site not specified: Secondary | ICD-10-CM | POA: Diagnosis not present

## 2019-02-24 DIAGNOSIS — M659 Synovitis and tenosynovitis, unspecified: Secondary | ICD-10-CM

## 2019-02-24 DIAGNOSIS — M7750 Other enthesopathy of unspecified foot: Secondary | ICD-10-CM | POA: Diagnosis not present

## 2019-02-24 DIAGNOSIS — M779 Enthesopathy, unspecified: Secondary | ICD-10-CM

## 2019-02-24 DIAGNOSIS — R600 Localized edema: Secondary | ICD-10-CM | POA: Diagnosis not present

## 2019-02-24 DIAGNOSIS — B9689 Other specified bacterial agents as the cause of diseases classified elsewhere: Secondary | ICD-10-CM | POA: Diagnosis not present

## 2019-02-24 DIAGNOSIS — R188 Other ascites: Secondary | ICD-10-CM | POA: Diagnosis not present

## 2019-02-24 DIAGNOSIS — M778 Other enthesopathies, not elsewhere classified: Secondary | ICD-10-CM

## 2019-02-24 DIAGNOSIS — Z940 Kidney transplant status: Secondary | ICD-10-CM | POA: Diagnosis not present

## 2019-02-24 DIAGNOSIS — F418 Other specified anxiety disorders: Secondary | ICD-10-CM | POA: Diagnosis not present

## 2019-02-24 DIAGNOSIS — N250 Renal osteodystrophy: Secondary | ICD-10-CM | POA: Diagnosis not present

## 2019-02-24 DIAGNOSIS — E7800 Pure hypercholesterolemia, unspecified: Secondary | ICD-10-CM | POA: Diagnosis not present

## 2019-02-24 DIAGNOSIS — N3001 Acute cystitis with hematuria: Secondary | ICD-10-CM | POA: Diagnosis not present

## 2019-02-24 DIAGNOSIS — R319 Hematuria, unspecified: Secondary | ICD-10-CM | POA: Diagnosis not present

## 2019-02-24 NOTE — Progress Notes (Signed)
Subjective:  Patient ID: Ariel [name], female    DOB: 05-23-1976,  MRN: [id number]  Chief Complaint  Patient presents with  . Foot Pain    Rt dorsal and medial foot pain x today; 10/10 shapr painh -no injury -worse without shoe -pt states it makes it better having it compressed Tx: ace wrap and boot    43 y.o. female presents with the above complaint.  History above confirmed with patient. Pain has recurred but not swollen. Boot no longer helping with the pain.  Review of Systems: Negative except as noted in the HPI. Denies N/V/F/Ch.  Past Medical History:  Diagnosis Date  . Anemia   . Cancer ([place])    pre cervical  . 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 History of renal transplant   . Hypercholesterolemia   . 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 Renal failure, chronic    Dialysis in the past  . Vulvar dystrophy     Current Outpatient Medications:  .  ALPRAZolam (XANAX) 0.5 MG tablet, Take 1 tablet at bedtime, Disp: , Rfl: 1 .  carvedilol (COREG) 12.5 MG tablet, Take 12.5 mg by mouth 2 (two) times daily., Disp: , Rfl: 3 .  furosemide (LASIX) 40 MG tablet, Take 40 mg by mouth daily., Disp: , Rfl:  .  Magnesium 200 MG TABS, Take 1 tablet by mouth daily.  , Disp: , Rfl:  .  medroxyPROGESTERone (PROVERA) 10 MG tablet, Take 10 mg by mouth daily., Disp: , Rfl: 3 .  oxyCODONE (OXY IR/ROXICODONE) 5 MG immediate release tablet, TK 1 T PO Q 6 H PRF PAIN, Disp: , Rfl:  .  Potassium Chloride ER 20 MEQ TBCR, Take 20 mEq by mouth daily., Disp: , Rfl: 6 .  predniSONE (DELTASONE) 5 MG tablet, Take 5 mg by mouth daily.  , Disp: , Rfl:  .  sertraline (ZOLOFT) 100 MG tablet, Take 100 mg by mouth daily., Disp: , Rfl: 5 .  tacrolimus (PROGRAF) 0.5 MG capsule, Take 0.5 mg by mouth 2 (two) times daily., Disp: , Rfl: 6 .  tacrolimus (PROGRAF) 1 MG capsule, Take 1 mg by mouth 2 (two) times daily., Disp: ,  Rfl: 6  Social History   Tobacco Use  Smoking Status Never Smoker  Smokeless Tobacco Never Used    Allergies  Allergen Reactions  . Bactrim [Sulfamethoxazole-Trimethoprim] Anaphylaxis  . Sulfamethoxazole   . Other Hives and Rash  . Azathioprine Itching  . Keflex [Cephalexin] Swelling    Per pt, her face and lips swell up and she develops hives  . Adhesive [Tape] Rash  . Bactrim Swelling and Rash  . Imuran [Azathioprine Sodium] Rash  . Tramadol Rash  . Warfarin Sodium Rash   Objective:   Vitals:   02/24/19 1557  Resp: 16  Temp: (!) 96.9 F (36.1 C)   There is no height or weight on file to calculate BMI. Constitutional Well developed. Well nourished.  Vascular Dorsalis pedis pulses palpable bilaterally. Posterior tibial pulses palpable bilaterally. Capillary refill normal to all digits.  No cyanosis or clubbing noted. Pedal hair growth normal.  Neurologic Normal speech. Oriented to person, place, and time. Epicritic sensation to light touch grossly present bilaterally.  Dermatologic Nails well groomed and normal in appearance. No open wounds. No skin lesions.  Orthopedic: Normal joint ROM without pain or crepitus bilaterally. POP right midfoot, along  PT tendon course, along navicular. Slight edema. No erythema   Radiographs: None Assessment:   1. Capsulitis of foot, right   2. Localized edema   3. Tenosynovitis of right foot   4. Tendonitis of ankle or foot    Plan:  Patient was evaluated and treated and all questions answered.  Inflammatory capsulitis with recurrence; unclear etiology; ?stress reaction, tendonitis -Order MRI for further eval -Unna boot for reduction of edema -F/u after MRI for further discussion  No follow-ups on file.

## 2019-02-26 ENCOUNTER — Other Ambulatory Visit: Payer: Self-pay | Admitting: Podiatry

## 2019-02-26 DIAGNOSIS — R600 Localized edema: Secondary | ICD-10-CM

## 2019-02-26 DIAGNOSIS — M7750 Other enthesopathy of unspecified foot: Secondary | ICD-10-CM

## 2019-02-26 DIAGNOSIS — M778 Other enthesopathies, not elsewhere classified: Secondary | ICD-10-CM

## 2019-02-28 DIAGNOSIS — L271 Localized skin eruption due to drugs and medicaments taken internally: Secondary | ICD-10-CM | POA: Diagnosis not present

## 2019-02-28 DIAGNOSIS — M79671 Pain in right foot: Secondary | ICD-10-CM | POA: Diagnosis not present

## 2019-03-10 ENCOUNTER — Other Ambulatory Visit: Payer: Self-pay

## 2019-03-10 ENCOUNTER — Ambulatory Visit (INDEPENDENT_AMBULATORY_CARE_PROVIDER_SITE_OTHER): Payer: Medicare Other | Admitting: Podiatry

## 2019-03-10 ENCOUNTER — Encounter: Payer: Self-pay | Admitting: Podiatry

## 2019-03-10 VITALS — Temp 96.0°F | Resp 16

## 2019-03-10 DIAGNOSIS — M659 Synovitis and tenosynovitis, unspecified: Secondary | ICD-10-CM | POA: Diagnosis not present

## 2019-03-10 DIAGNOSIS — M7750 Other enthesopathy of unspecified foot: Secondary | ICD-10-CM

## 2019-03-10 DIAGNOSIS — R600 Localized edema: Secondary | ICD-10-CM | POA: Diagnosis not present

## 2019-03-10 DIAGNOSIS — M778 Other enthesopathies, not elsewhere classified: Secondary | ICD-10-CM

## 2019-03-10 DIAGNOSIS — M779 Enthesopathy, unspecified: Secondary | ICD-10-CM

## 2019-03-13 ENCOUNTER — Other Ambulatory Visit: Payer: Self-pay | Admitting: *Deleted

## 2019-03-13 DIAGNOSIS — M898X7 Other specified disorders of bone, ankle and foot: Secondary | ICD-10-CM | POA: Diagnosis not present

## 2019-03-13 DIAGNOSIS — M779 Enthesopathy, unspecified: Secondary | ICD-10-CM | POA: Diagnosis not present

## 2019-03-13 DIAGNOSIS — M659 Synovitis and tenosynovitis, unspecified: Secondary | ICD-10-CM | POA: Diagnosis not present

## 2019-03-13 DIAGNOSIS — M19071 Primary osteoarthritis, right ankle and foot: Secondary | ICD-10-CM | POA: Diagnosis not present

## 2019-03-13 DIAGNOSIS — M7750 Other enthesopathy of unspecified foot: Secondary | ICD-10-CM | POA: Diagnosis not present

## 2019-03-16 ENCOUNTER — Telehealth: Payer: Self-pay | Admitting: *Deleted

## 2019-03-16 DIAGNOSIS — M659 Synovitis and tenosynovitis, unspecified: Secondary | ICD-10-CM

## 2019-03-16 DIAGNOSIS — R600 Localized edema: Secondary | ICD-10-CM

## 2019-03-16 DIAGNOSIS — M778 Other enthesopathies, not elsewhere classified: Secondary | ICD-10-CM

## 2019-03-16 DIAGNOSIS — M7750 Other enthesopathy of unspecified foot: Secondary | ICD-10-CM

## 2019-03-16 NOTE — Telephone Encounter (Signed)
[name] states pt's labs are abnormal and will not be able to have contrast, will need orders faxed for MRIs without contrast.

## 2019-03-16 NOTE — Telephone Encounter (Signed)
[facility] STATES NO PRE-CERT NECESSARY FOR THIS [place] PATIENT AT THIS TIME. Faxed new orders to [facility].

## 2019-03-16 NOTE — Telephone Encounter (Signed)
Message of 03/13/2019 concerning changing MRIs to without contrast was taken by A. Prevette, CMA and pt's name was not complete. I called [facility] Jacob Moores states pt name and that MRIs were performed but will need orders changed to without and may need to check pre-cert Medicaid.

## 2019-03-23 ENCOUNTER — Encounter: Payer: Self-pay | Admitting: Podiatry

## 2019-03-24 ENCOUNTER — Ambulatory Visit: Payer: Medicare Other | Admitting: Podiatry

## 2019-03-24 DIAGNOSIS — Z940 Kidney transplant status: Secondary | ICD-10-CM | POA: Diagnosis not present

## 2019-03-24 DIAGNOSIS — R799 Abnormal finding of blood chemistry, unspecified: Secondary | ICD-10-CM | POA: Diagnosis not present

## 2019-03-24 DIAGNOSIS — Z79899 Other long term (current) drug therapy: Secondary | ICD-10-CM | POA: Diagnosis not present

## 2019-03-25 DIAGNOSIS — R140 Abdominal distension (gaseous): Secondary | ICD-10-CM | POA: Diagnosis not present

## 2019-03-25 DIAGNOSIS — R1031 Right lower quadrant pain: Secondary | ICD-10-CM | POA: Diagnosis not present

## 2019-03-25 DIAGNOSIS — K219 Gastro-esophageal reflux disease without esophagitis: Secondary | ICD-10-CM | POA: Diagnosis not present

## 2019-03-26 DIAGNOSIS — N2581 Secondary hyperparathyroidism of renal origin: Secondary | ICD-10-CM | POA: Diagnosis not present

## 2019-03-26 DIAGNOSIS — R140 Abdominal distension (gaseous): Secondary | ICD-10-CM | POA: Diagnosis not present

## 2019-03-26 DIAGNOSIS — Z940 Kidney transplant status: Secondary | ICD-10-CM | POA: Diagnosis not present

## 2019-03-26 DIAGNOSIS — K6282 Dysplasia of anus: Secondary | ICD-10-CM | POA: Diagnosis not present

## 2019-03-26 DIAGNOSIS — I10 Essential (primary) hypertension: Secondary | ICD-10-CM | POA: Diagnosis not present

## 2019-03-29 NOTE — Progress Notes (Signed)
Subjective:  Patient ID: Ariel [name], female    DOB: October 10, 1975,  MRN: [id number]  Chief Complaint  Patient presents with  . capsulitis    F/U Rt capsulitis Pt. states," the wrapp was fine, the swelling went down some with the beach water, but it came right back and it's still painful; 7/10 constant pain Tx: tylenol -pt denies N/V/F/Ch     43 y.o. female presents with the above complaint.  History above confirmed with patient.  Unable to get MRI so far  Review of Systems: Negative except as noted in the HPI. Denies N/V/F/Ch.  Past Medical History:  Diagnosis Date  . Anemia   . Cancer ([place])    pre cervical  . 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 History of renal transplant   . Hypercholesterolemia   . 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 Renal failure, chronic    Dialysis in the past  . Vulvar dystrophy     Current Outpatient Medications:  .  ALPRAZolam (XANAX) 0.5 MG tablet, Take 1 tablet at bedtime, Disp: , Rfl: 1 .  amoxicillin-clavulanate (AUGMENTIN) 500-125 MG tablet, TK 1 T PO BID, Disp: , Rfl:  .  carvedilol (COREG) 12.5 MG tablet, Take 12.5 mg by mouth 2 (two) times daily., Disp: , Rfl: 3 .  furosemide (LASIX) 40 MG tablet, Take 40 mg by mouth daily., Disp: , Rfl:  .  Magnesium 200 MG TABS, Take 1 tablet by mouth daily.  , Disp: , Rfl:  .  medroxyPROGESTERone (PROVERA) 10 MG tablet, Take 10 mg by mouth daily., Disp: , Rfl: 3 .  mycophenolate (MYFORTIC) 360 MG TBEC EC tablet, , Disp: , Rfl:  .  ondansetron (ZOFRAN-ODT) 4 MG disintegrating tablet, , Disp: , Rfl:  .  oxyCODONE (OXY IR/ROXICODONE) 5 MG immediate release tablet, TK 1 T PO Q 6 H PRF PAIN, Disp: , Rfl:  .  Potassium Chloride ER 20 MEQ TBCR, Take 20 mEq by mouth daily., Disp: , Rfl: 6 .  predniSONE (DELTASONE) 5 MG tablet, Take 5 mg by mouth daily.  , Disp: , Rfl:  .  sertraline (ZOLOFT) 100 MG tablet, Take 100 mg  by mouth daily., Disp: , Rfl: 5 .  simvastatin (ZOCOR) 10 MG tablet, TK 1 T PO  D, Disp: , Rfl:  .  tacrolimus (PROGRAF) 0.5 MG capsule, Take 0.5 mg by mouth 2 (two) times daily., Disp: , Rfl: 6 .  tacrolimus (PROGRAF) 1 MG capsule, Take 1 mg by mouth 2 (two) times daily., Disp: , Rfl: 6 .  traZODone (DESYREL) 50 MG tablet, TK 1 T PO HS PRF SLP, Disp: , Rfl:   Social History   Tobacco Use  Smoking Status Never Smoker  Smokeless Tobacco Never Used    Allergies  Allergen Reactions  . Bactrim [Sulfamethoxazole-Trimethoprim] Anaphylaxis  . Sulfamethoxazole   . Other Hives and Rash  . Azathioprine Itching  . Keflex [Cephalexin] Swelling    Per pt, her face and lips swell up and she develops hives  . Adhesive [Tape] Rash  . Bactrim Swelling and Rash  . Imuran [Azathioprine Sodium] Rash  . Tramadol Rash  . Warfarin Sodium Rash   Objective:   Vitals:   03/10/19 0933  Resp: 16  Temp: (!) 96 F (35.6 C)   There is no height or weight on file to calculate BMI. Constitutional Well developed.  Well nourished.  Vascular Dorsalis pedis pulses palpable bilaterally. Posterior tibial pulses palpable bilaterally. Capillary refill normal to all digits.  No cyanosis or clubbing noted. Pedal hair growth normal.  Neurologic Normal speech. Oriented to person, place, and time. Epicritic sensation to light touch grossly present bilaterally.  Dermatologic Nails well groomed and normal in appearance. No open wounds. No skin lesions.  Orthopedic: Normal joint ROM without pain or crepitus bilaterally. POP right midfoot, along PT tendon course, along navicular. Slight edema. No erythema   Radiographs: None Assessment:   1. Capsulitis of foot, right   2. Tenosynovitis of right foot   3. Tendonitis of ankle or foot   4. Localized edema    Plan:  Patient was evaluated and treated and all questions answered.  Inflammatory capsulitis with recurrence; unclear etiology; ?stress reaction,  tendonitis -Still having significant pain MRI pending we will await MRI and further discuss next visit.  No follow-ups on file.

## 2019-03-31 ENCOUNTER — Ambulatory Visit (INDEPENDENT_AMBULATORY_CARE_PROVIDER_SITE_OTHER): Payer: Medicare Other | Admitting: Podiatry

## 2019-03-31 ENCOUNTER — Other Ambulatory Visit: Payer: Self-pay

## 2019-03-31 DIAGNOSIS — M7750 Other enthesopathy of unspecified foot: Secondary | ICD-10-CM

## 2019-03-31 DIAGNOSIS — M958 Other specified acquired deformities of musculoskeletal system: Secondary | ICD-10-CM

## 2019-03-31 DIAGNOSIS — M779 Enthesopathy, unspecified: Secondary | ICD-10-CM

## 2019-03-31 DIAGNOSIS — M778 Other enthesopathies, not elsewhere classified: Secondary | ICD-10-CM

## 2019-03-31 DIAGNOSIS — N183 Chronic kidney disease, stage 3 (moderate): Secondary | ICD-10-CM | POA: Diagnosis not present

## 2019-03-31 DIAGNOSIS — M659 Synovitis and tenosynovitis, unspecified: Secondary | ICD-10-CM | POA: Diagnosis not present

## 2019-03-31 DIAGNOSIS — L989 Disorder of the skin and subcutaneous tissue, unspecified: Secondary | ICD-10-CM | POA: Diagnosis not present

## 2019-03-31 NOTE — Progress Notes (Signed)
Subjective:  Patient ID: Ariel [name], female    DOB: [birth year],  MRN: [id number]  Chief Complaint  Patient presents with  . Foot Pain    pt is here for a f/u on her mri, pt states that her foot has changed color to more of a purplish color    43 y.o. female presents with the above complaint.  History above confirmed with patient. No longer having the midfoot pain.  Review of Systems: Negative except as noted in the HPI. Denies N/V/F/Ch.  Past Medical History:  Diagnosis Date  . Anemia   . Cancer ([place])    pre cervical  . 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 History of renal transplant   . Hypercholesterolemia   . 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 Renal failure, chronic    Dialysis in the past  . Vulvar dystrophy     Current Outpatient Medications:  .  ALPRAZolam (XANAX) 0.5 MG tablet, Take 1 tablet at bedtime, Disp: , Rfl: 1 .  amoxicillin-clavulanate (AUGMENTIN) 500-125 MG tablet, TK 1 T PO BID, Disp: , Rfl:  .  carvedilol (COREG) 12.5 MG tablet, Take 12.5 mg by mouth 2 (two) times daily., Disp: , Rfl: 3 .  famotidine (PEPCID) 20 MG tablet, Take by mouth., Disp: , Rfl:  .  famotidine (PEPCID) 20 MG tablet, , Disp: , Rfl:  .  furosemide (LASIX) 40 MG tablet, Take 40 mg by mouth daily., Disp: , Rfl:  .  Magnesium 200 MG TABS, Take 1 tablet by mouth daily.  , Disp: , Rfl:  .  medroxyPROGESTERone (PROVERA) 10 MG tablet, Take 10 mg by mouth daily., Disp: , Rfl: 3 .  mycophenolate (MYFORTIC) 360 MG TBEC EC tablet, , Disp: , Rfl:  .  ondansetron (ZOFRAN-ODT) 4 MG disintegrating tablet, , Disp: , Rfl:  .  oxyCODONE (OXY IR/ROXICODONE) 5 MG immediate release tablet, TK 1 T PO Q 6 H PRF PAIN, Disp: , Rfl:  .  Potassium Chloride ER 20 MEQ TBCR, Take 20 mEq by mouth daily., Disp: , Rfl: 6 .  predniSONE (DELTASONE) 5 MG tablet, Take 5 mg by mouth daily.  , Disp: , Rfl:  .  sertraline  (ZOLOFT) 100 MG tablet, Take 100 mg by mouth daily., Disp: , Rfl: 5 .  simvastatin (ZOCOR) 10 MG tablet, TK 1 T PO  D, Disp: , Rfl:  .  tacrolimus (PROGRAF) 0.5 MG capsule, Take 0.5 mg by mouth 2 (two) times daily., Disp: , Rfl: 6 .  tacrolimus (PROGRAF) 1 MG capsule, Take 1 mg by mouth 2 (two) times daily., Disp: , Rfl: 6 .  traZODone (DESYREL) 50 MG tablet, TK 1 T PO HS PRF SLP, Disp: , Rfl:   Social History   Tobacco Use  Smoking Status Never Smoker  Smokeless Tobacco Never Used    Allergies  Allergen Reactions  . Bactrim [Sulfamethoxazole-Trimethoprim] Anaphylaxis  . Sulfamethoxazole   . Other Hives and Rash  . Azathioprine Itching  . Keflex [Cephalexin] Swelling    Per pt, her face and lips swell up and she develops hives  . Adhesive [Tape] Rash  . Bactrim Swelling and Rash  . Imuran [Azathioprine Sodium] Rash  . Tramadol Rash  . Warfarin Sodium Rash   Objective:   There were no vitals filed for this visit. There is no height or weight on file to calculate  BMI. Constitutional Well developed. Well nourished.  Vascular Dorsalis pedis pulses palpable bilaterally. Posterior tibial pulses palpable bilaterally. Capillary refill normal to all digits.  No cyanosis or clubbing noted. Pedal hair growth normal.  Neurologic Normal speech. Oriented to person, place, and time. Epicritic sensation to light touch grossly present bilaterally.  Dermatologic Nails well groomed and normal in appearance. No open wounds. No skin lesions.  Orthopedic: Normal joint ROM without pain or crepitus bilaterally. POP right midfoot, along PT tendon course, along navicular. Slight edema. No erythema   Radiographs: None  MRI reviewed.  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 Assessment:   1. Capsulitis of foot, right   2. Tenosynovitis of right foot   3. Tendonitis of ankle or foot   4. Osteochondral defect of ankle    Plan:  Patient was evaluated  and treated and all questions answered.  Inflammatory capsulitis with recurrence; unclear etiology; ?stress reaction, tendonitis -No longer having pain at the midfoot where previously was experiencing pain. MRI reviewed with patient.  Discussed ankle pain continues would consider possible ankle arthroscopy.  Hold off at this time.  Discussed possible injection first metatarsophalangeal joint should pain continue  No follow-ups on file.

## 2019-04-03 DIAGNOSIS — Z01419 Encounter for gynecological examination (general) (routine) without abnormal findings: Secondary | ICD-10-CM | POA: Diagnosis not present

## 2019-04-03 DIAGNOSIS — N926 Irregular menstruation, unspecified: Secondary | ICD-10-CM | POA: Diagnosis not present

## 2019-04-14 DIAGNOSIS — R140 Abdominal distension (gaseous): Secondary | ICD-10-CM | POA: Diagnosis not present

## 2019-04-14 DIAGNOSIS — R188 Other ascites: Secondary | ICD-10-CM | POA: Diagnosis not present

## 2019-04-14 DIAGNOSIS — N189 Chronic kidney disease, unspecified: Secondary | ICD-10-CM | POA: Diagnosis not present

## 2019-04-14 DIAGNOSIS — Z940 Kidney transplant status: Secondary | ICD-10-CM | POA: Diagnosis not present

## 2019-04-15 DIAGNOSIS — M25562 Pain in left knee: Secondary | ICD-10-CM | POA: Diagnosis not present

## 2019-04-15 DIAGNOSIS — F339 Major depressive disorder, recurrent, unspecified: Secondary | ICD-10-CM | POA: Diagnosis not present

## 2019-04-15 DIAGNOSIS — F419 Anxiety disorder, unspecified: Secondary | ICD-10-CM | POA: Diagnosis not present

## 2019-04-15 DIAGNOSIS — G4700 Insomnia, unspecified: Secondary | ICD-10-CM | POA: Diagnosis not present

## 2019-04-17 DIAGNOSIS — Z940 Kidney transplant status: Secondary | ICD-10-CM | POA: Diagnosis not present

## 2019-05-01 DIAGNOSIS — L03116 Cellulitis of left lower limb: Secondary | ICD-10-CM | POA: Diagnosis not present

## 2019-05-01 DIAGNOSIS — M79672 Pain in left foot: Secondary | ICD-10-CM | POA: Diagnosis not present

## 2019-05-04 ENCOUNTER — Other Ambulatory Visit: Payer: Self-pay

## 2019-05-04 ENCOUNTER — Other Ambulatory Visit: Payer: Self-pay | Admitting: Podiatry

## 2019-05-04 ENCOUNTER — Encounter: Payer: Self-pay | Admitting: Podiatry

## 2019-05-04 ENCOUNTER — Ambulatory Visit (INDEPENDENT_AMBULATORY_CARE_PROVIDER_SITE_OTHER): Payer: Medicare Other | Admitting: Podiatry

## 2019-05-04 ENCOUNTER — Ambulatory Visit (INDEPENDENT_AMBULATORY_CARE_PROVIDER_SITE_OTHER): Payer: Medicare Other

## 2019-05-04 VITALS — Temp 96.6°F | Resp 16

## 2019-05-04 DIAGNOSIS — M7752 Other enthesopathy of left foot: Secondary | ICD-10-CM

## 2019-05-04 DIAGNOSIS — M79672 Pain in left foot: Secondary | ICD-10-CM | POA: Diagnosis not present

## 2019-05-04 DIAGNOSIS — R600 Localized edema: Secondary | ICD-10-CM | POA: Diagnosis not present

## 2019-05-04 DIAGNOSIS — M778 Other enthesopathies, not elsewhere classified: Secondary | ICD-10-CM

## 2019-05-04 DIAGNOSIS — M659 Synovitis and tenosynovitis, unspecified: Secondary | ICD-10-CM | POA: Diagnosis not present

## 2019-05-04 DIAGNOSIS — M779 Enthesopathy, unspecified: Secondary | ICD-10-CM | POA: Diagnosis not present

## 2019-05-04 DIAGNOSIS — M25562 Pain in left knee: Secondary | ICD-10-CM | POA: Diagnosis not present

## 2019-05-04 DIAGNOSIS — M25462 Effusion, left knee: Secondary | ICD-10-CM | POA: Diagnosis not present

## 2019-05-04 NOTE — Progress Notes (Signed)
Subjective:  Patient ID: Ariel [name], female    DOB: 03/21/1976,  MRN: [id number]  Chief Complaint  Patient presents with  . Foot Pain    Lt medial foot pain x Tues; 10/10 shapr pains -pt denies injury -pt stats Friday she went to ER for foot pain, Dr. stated it was a skin irritation and was rx hydrocodone and cephalexin; which are not helping. -w/ swelling, redness,warmth Tx: tylenol -pt states she is also having knee problems    43 y.o. female presents with the above complaint. Hx as above.  Review of Systems: Negative except as noted in the HPI. Denies N/V/F/Ch.  Past Medical History:  Diagnosis Date  . Anemia   . Cancer ([place])    pre cervical  . 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 History of renal transplant   . Hypercholesterolemia   . 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 Renal failure, chronic    Dialysis in the past  . Vulvar dystrophy     Current Outpatient Medications:  .  ALPRAZolam (XANAX) 0.5 MG tablet, Take 1 tablet at bedtime, Disp: , Rfl: 1 .  amoxicillin-clavulanate (AUGMENTIN) 500-125 MG tablet, TK 1 T PO BID, Disp: , Rfl:  .  carvedilol (COREG) 12.5 MG tablet, Take 12.5 mg by mouth 2 (two) times daily., Disp: , Rfl: 3 .  clindamycin (CLEOCIN) 300 MG capsule, Take 300 mg by mouth., Disp: , Rfl:  .  famotidine (PEPCID) 20 MG tablet, Take by mouth., Disp: , Rfl:  .  famotidine (PEPCID) 20 MG tablet, , Disp: , Rfl:  .  furosemide (LASIX) 40 MG tablet, Take 40 mg by mouth daily., Disp: , Rfl:  .  HYDROcodone-acetaminophen (NORCO/VICODIN) 5-325 MG tablet, TK 1 T PO Q 6 H PRF PAIN, Disp: , Rfl:  .  Magnesium 200 MG TABS, Take 1 tablet by mouth daily.  , Disp: , Rfl:  .  medroxyPROGESTERone (PROVERA) 10 MG tablet, Take 10 mg by mouth daily., Disp: , Rfl: 3 .  mycophenolate (MYFORTIC) 360 MG TBEC EC tablet, , Disp: , Rfl:  .  ondansetron (ZOFRAN-ODT) 4 MG disintegrating  tablet, , Disp: , Rfl:  .  oxyCODONE (OXY IR/ROXICODONE) 5 MG immediate release tablet, TK 1 T PO Q 6 H PRF PAIN, Disp: , Rfl:  .  Potassium Chloride ER 20 MEQ TBCR, Take 20 mEq by mouth daily., Disp: , Rfl: 6 .  predniSONE (DELTASONE) 5 MG tablet, Take 5 mg by mouth daily.  , Disp: , Rfl:  .  sertraline (ZOLOFT) 100 MG tablet, Take 100 mg by mouth daily., Disp: , Rfl: 5 .  simvastatin (ZOCOR) 10 MG tablet, TK 1 T PO  D, Disp: , Rfl:  .  tacrolimus (PROGRAF) 0.5 MG capsule, Take 0.5 mg by mouth 2 (two) times daily., Disp: , Rfl: 6 .  tacrolimus (PROGRAF) 1 MG capsule, Take 1 mg by mouth 2 (two) times daily., Disp: , Rfl: 6 .  traZODone (DESYREL) 50 MG tablet, TK 1 T PO HS PRF SLP, Disp: , Rfl:   Social History   Tobacco Use  Smoking Status Never Smoker  Smokeless Tobacco Never Used    Allergies  Allergen Reactions  . Bactrim [Sulfamethoxazole-Trimethoprim] Anaphylaxis  . Sulfamethoxazole   . Other Hives and Rash  . Azathioprine Itching  . Keflex [Cephalexin] Swelling    Per pt, her face and  lips swell up and she develops hives  . Adhesive [Tape] Rash  . Bactrim Swelling and Rash  . Imuran [Azathioprine Sodium] Rash  . Tramadol Rash  . Warfarin Sodium Rash   Objective:   Vitals:   05/04/19 1637  Resp: 16  Temp: (!) 96.6 F (35.9 C)   There is no height or weight on file to calculate BMI. Constitutional Well developed. Well nourished.  Vascular Dorsalis pedis pulses palpable bilaterally. Posterior tibial pulses palpable bilaterally. Capillary refill normal to all digits.  No cyanosis or clubbing noted. Pedal hair growth normal.  Neurologic Normal speech. Oriented to person, place, and time. Epicritic sensation to light touch grossly present bilaterally.  Dermatologic Nails well groomed and normal in appearance. No open wounds. No skin lesions. Local warmth, erythema over the medial navicular, cuneiforms  Orthopedic: POP medial nacivular, cuneiforms   Radiographs:  Taken and reviwed no acute fractures or dislocations no underlying osseous erosions.Vascular calcifications noted. Assessment:   1. Capsulitis of foot, left   2. Tenosynovitis of left foot   3. Localized edema    Plan:  Patient was evaluated and treated and all questions answered.  Inflammatory Capsulitis/Tendonitis Left Foot -XR reviewed as above -Unna boot applied for reduction of edema -CAM boot applied for immobilization -F/u in 3 weeks for repeat XR to r/o occult fracture.  No follow-ups on file.

## 2019-05-07 DIAGNOSIS — M25562 Pain in left knee: Secondary | ICD-10-CM | POA: Diagnosis not present

## 2019-05-25 ENCOUNTER — Ambulatory Visit (INDEPENDENT_AMBULATORY_CARE_PROVIDER_SITE_OTHER): Payer: Medicare Other

## 2019-05-25 ENCOUNTER — Other Ambulatory Visit: Payer: Self-pay

## 2019-05-25 ENCOUNTER — Other Ambulatory Visit: Payer: Self-pay | Admitting: Podiatry

## 2019-05-25 ENCOUNTER — Ambulatory Visit (INDEPENDENT_AMBULATORY_CARE_PROVIDER_SITE_OTHER): Payer: Medicare Other | Admitting: Podiatry

## 2019-05-25 DIAGNOSIS — M76821 Posterior tibial tendinitis, right leg: Secondary | ICD-10-CM | POA: Diagnosis not present

## 2019-05-25 DIAGNOSIS — M659 Synovitis and tenosynovitis, unspecified: Secondary | ICD-10-CM | POA: Diagnosis not present

## 2019-05-25 DIAGNOSIS — M779 Enthesopathy, unspecified: Secondary | ICD-10-CM

## 2019-05-25 DIAGNOSIS — M79672 Pain in left foot: Secondary | ICD-10-CM

## 2019-05-25 DIAGNOSIS — M778 Other enthesopathies, not elsewhere classified: Secondary | ICD-10-CM

## 2019-05-25 DIAGNOSIS — M7752 Other enthesopathy of left foot: Secondary | ICD-10-CM | POA: Diagnosis not present

## 2019-05-25 NOTE — Progress Notes (Signed)
Subjective:  Patient ID: Ariel [name], female    DOB: [birth year],  MRN: [id number]  Chief Complaint  Patient presents with  . capsulitis    F/U Lt capsulitis/tendonitis Pt. states," it's healed up pretty good, no pain." Tx: none -pt denies swellign     43 y.o. female presents with the above complaint. Hx as above.  Review of Systems: Negative except as noted in the HPI. Denies N/V/F/Ch.  Past Medical History:  Diagnosis Date  . Anemia   . Cancer ([place])    pre cervical  . 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 History of renal transplant   . Hypercholesterolemia   . 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 Renal failure, chronic    Dialysis in the past  . Vulvar dystrophy     Current Outpatient Medications:  .  ALPRAZolam (XANAX) 0.5 MG tablet, Take 1 tablet at bedtime, Disp: , Rfl: 1 .  amoxicillin-clavulanate (AUGMENTIN) 500-125 MG tablet, TK 1 T PO BID, Disp: , Rfl:  .  carvedilol (COREG) 12.5 MG tablet, Take 12.5 mg by mouth 2 (two) times daily., Disp: , Rfl: 3 .  clindamycin (CLEOCIN) 300 MG capsule, Take 300 mg by mouth., Disp: , Rfl:  .  famotidine (PEPCID) 20 MG tablet, Take by mouth., Disp: , Rfl:  .  famotidine (PEPCID) 20 MG tablet, , Disp: , Rfl:  .  furosemide (LASIX) 40 MG tablet, Take 40 mg by mouth daily., Disp: , Rfl:  .  HYDROcodone-acetaminophen (NORCO/VICODIN) 5-325 MG tablet, TK 1 T PO Q 6 H PRF PAIN, Disp: , Rfl:  .  Magnesium 200 MG TABS, Take 1 tablet by mouth daily.  , Disp: , Rfl:  .  medroxyPROGESTERone (PROVERA) 10 MG tablet, Take 10 mg by mouth daily., Disp: , Rfl: 3 .  mycophenolate (MYFORTIC) 360 MG TBEC EC tablet, , Disp: , Rfl:  .  ondansetron (ZOFRAN-ODT) 4 MG disintegrating tablet, , Disp: , Rfl:  .  oxyCODONE (OXY IR/ROXICODONE) 5 MG immediate release tablet, TK 1 T PO Q 6 H PRF PAIN, Disp: , Rfl:  .  Potassium Chloride ER 20 MEQ TBCR, Take 20 mEq by  mouth daily., Disp: , Rfl: 6 .  predniSONE (DELTASONE) 5 MG tablet, Take 5 mg by mouth daily.  , Disp: , Rfl:  .  sertraline (ZOLOFT) 100 MG tablet, Take 100 mg by mouth daily., Disp: , Rfl: 5 .  sertraline (ZOLOFT) 50 MG tablet, TK 1 T PO D, Disp: , Rfl:  .  simvastatin (ZOCOR) 10 MG tablet, TK 1 T PO  D, Disp: , Rfl:  .  tacrolimus (PROGRAF) 0.5 MG capsule, Take 0.5 mg by mouth 2 (two) times daily., Disp: , Rfl: 6 .  tacrolimus (PROGRAF) 1 MG capsule, Take 1 mg by mouth 2 (two) times daily., Disp: , Rfl: 6 .  traZODone (DESYREL) 50 MG tablet, TK 1 T PO HS PRF SLP, Disp: , Rfl:   Social History   Tobacco Use  Smoking Status Never Smoker  Smokeless Tobacco Never Used    Allergies  Allergen Reactions  . Bactrim [Sulfamethoxazole-Trimethoprim] Anaphylaxis  . Sulfamethoxazole   . Other Hives and Rash  . Azathioprine Itching  . Keflex [Cephalexin] Swelling    Per pt, her face and lips swell up and she develops hives  . Adhesive [Tape] Rash  . Bactrim Swelling and Rash  .  Imuran [Azathioprine Sodium] Rash  . Tramadol Rash  . Warfarin Sodium Rash   Objective:   There were no vitals filed for this visit. There is no height or weight on file to calculate BMI. Constitutional Well developed. Well nourished.  Vascular Dorsalis pedis pulses palpable bilaterally. Posterior tibial pulses palpable bilaterally. Capillary refill normal to all digits.  No cyanosis or clubbing noted. Pedal hair growth normal.  Neurologic Normal speech. Oriented to person, place, and time. Epicritic sensation to light touch grossly present bilaterally.  Dermatologic Nails well groomed and normal in appearance. No open wounds. No skin lesions. Local warmth, erythema over the medial navicular, cuneiforms  Orthopedic: No POP left foot.  POP right PT tendon   Radiographs: Taken and reviwed no interval fracture Assessment:   1. Capsulitis of foot, left   2. Tenosynovitis of right foot   3. Posterior  tibial tendinitis of right lower extremity    Plan:  Patient was evaluated and treated and all questions answered.  Inflammatory Capsulitis/Tendonitis Left Foot -Appears resolved. -Repeat XR without e/o fracture  R PT tendonitis -Dispense trilock brace. Immobilize for 2 weeks  No follow-ups on file.

## 2019-05-26 DIAGNOSIS — M25562 Pain in left knee: Secondary | ICD-10-CM | POA: Diagnosis not present

## 2019-05-26 DIAGNOSIS — M94262 Chondromalacia, left knee: Secondary | ICD-10-CM | POA: Diagnosis not present

## 2019-06-03 ENCOUNTER — Other Ambulatory Visit: Payer: Self-pay | Admitting: Podiatry

## 2019-06-03 DIAGNOSIS — M79672 Pain in left foot: Secondary | ICD-10-CM

## 2019-06-03 DIAGNOSIS — M778 Other enthesopathies, not elsewhere classified: Secondary | ICD-10-CM

## 2019-06-11 DIAGNOSIS — Z79899 Other long term (current) drug therapy: Secondary | ICD-10-CM | POA: Diagnosis not present

## 2019-06-11 DIAGNOSIS — Z940 Kidney transplant status: Secondary | ICD-10-CM | POA: Diagnosis not present

## 2019-06-12 DIAGNOSIS — E785 Hyperlipidemia, unspecified: Secondary | ICD-10-CM | POA: Diagnosis not present

## 2019-06-12 DIAGNOSIS — K219 Gastro-esophageal reflux disease without esophagitis: Secondary | ICD-10-CM | POA: Diagnosis not present

## 2019-06-12 DIAGNOSIS — M6752 Plica syndrome, left knee: Secondary | ICD-10-CM | POA: Diagnosis not present

## 2019-06-12 DIAGNOSIS — Z940 Kidney transplant status: Secondary | ICD-10-CM | POA: Diagnosis not present

## 2019-06-12 DIAGNOSIS — Z85828 Personal history of other malignant neoplasm of skin: Secondary | ICD-10-CM | POA: Diagnosis not present

## 2019-06-12 DIAGNOSIS — M948X6 Other specified disorders of cartilage, lower leg: Secondary | ICD-10-CM | POA: Diagnosis not present

## 2019-06-12 DIAGNOSIS — Z79899 Other long term (current) drug therapy: Secondary | ICD-10-CM | POA: Diagnosis not present

## 2019-06-12 DIAGNOSIS — F419 Anxiety disorder, unspecified: Secondary | ICD-10-CM | POA: Diagnosis not present

## 2019-06-12 DIAGNOSIS — Z7952 Long term (current) use of systemic steroids: Secondary | ICD-10-CM | POA: Diagnosis not present

## 2019-06-12 DIAGNOSIS — M94262 Chondromalacia, left knee: Secondary | ICD-10-CM | POA: Diagnosis not present

## 2019-06-12 DIAGNOSIS — I129 Hypertensive chronic kidney disease with stage 1 through stage 4 chronic kidney disease, or unspecified chronic kidney disease: Secondary | ICD-10-CM | POA: Diagnosis not present

## 2019-06-12 DIAGNOSIS — G8929 Other chronic pain: Secondary | ICD-10-CM | POA: Diagnosis not present

## 2019-06-12 DIAGNOSIS — Z86718 Personal history of other venous thrombosis and embolism: Secondary | ICD-10-CM | POA: Diagnosis not present

## 2019-06-12 DIAGNOSIS — N183 Chronic kidney disease, stage 3 unspecified: Secondary | ICD-10-CM | POA: Diagnosis not present

## 2019-06-12 DIAGNOSIS — M949 Disorder of cartilage, unspecified: Secondary | ICD-10-CM | POA: Diagnosis not present

## 2019-06-12 DIAGNOSIS — G4730 Sleep apnea, unspecified: Secondary | ICD-10-CM | POA: Diagnosis not present

## 2019-06-12 DIAGNOSIS — J309 Allergic rhinitis, unspecified: Secondary | ICD-10-CM | POA: Diagnosis not present

## 2019-06-12 DIAGNOSIS — M25462 Effusion, left knee: Secondary | ICD-10-CM | POA: Diagnosis not present

## 2019-06-12 DIAGNOSIS — I10 Essential (primary) hypertension: Secondary | ICD-10-CM | POA: Diagnosis not present

## 2019-06-12 DIAGNOSIS — M899 Disorder of bone, unspecified: Secondary | ICD-10-CM | POA: Diagnosis not present

## 2019-06-12 DIAGNOSIS — M25562 Pain in left knee: Secondary | ICD-10-CM | POA: Diagnosis not present

## 2019-06-15 DIAGNOSIS — R2689 Other abnormalities of gait and mobility: Secondary | ICD-10-CM | POA: Diagnosis not present

## 2019-06-15 DIAGNOSIS — M6281 Muscle weakness (generalized): Secondary | ICD-10-CM | POA: Diagnosis not present

## 2019-06-15 DIAGNOSIS — F419 Anxiety disorder, unspecified: Secondary | ICD-10-CM | POA: Diagnosis not present

## 2019-06-15 DIAGNOSIS — M25562 Pain in left knee: Secondary | ICD-10-CM | POA: Diagnosis not present

## 2019-06-15 DIAGNOSIS — F339 Major depressive disorder, recurrent, unspecified: Secondary | ICD-10-CM | POA: Diagnosis not present

## 2019-06-15 DIAGNOSIS — G4700 Insomnia, unspecified: Secondary | ICD-10-CM | POA: Diagnosis not present

## 2019-06-15 DIAGNOSIS — M25662 Stiffness of left knee, not elsewhere classified: Secondary | ICD-10-CM | POA: Diagnosis not present

## 2019-06-22 DIAGNOSIS — M25662 Stiffness of left knee, not elsewhere classified: Secondary | ICD-10-CM | POA: Diagnosis not present

## 2019-06-22 DIAGNOSIS — R2689 Other abnormalities of gait and mobility: Secondary | ICD-10-CM | POA: Diagnosis not present

## 2019-06-22 DIAGNOSIS — M25562 Pain in left knee: Secondary | ICD-10-CM | POA: Diagnosis not present

## 2019-06-22 DIAGNOSIS — M6281 Muscle weakness (generalized): Secondary | ICD-10-CM | POA: Diagnosis not present

## 2019-06-26 DIAGNOSIS — Z940 Kidney transplant status: Secondary | ICD-10-CM | POA: Diagnosis not present

## 2019-06-26 DIAGNOSIS — Z79891 Long term (current) use of opiate analgesic: Secondary | ICD-10-CM | POA: Diagnosis not present

## 2019-06-26 DIAGNOSIS — E7800 Pure hypercholesterolemia, unspecified: Secondary | ICD-10-CM | POA: Diagnosis not present

## 2019-06-26 DIAGNOSIS — R0789 Other chest pain: Secondary | ICD-10-CM | POA: Diagnosis not present

## 2019-06-26 DIAGNOSIS — I10 Essential (primary) hypertension: Secondary | ICD-10-CM | POA: Diagnosis not present

## 2019-06-26 DIAGNOSIS — Z6833 Body mass index (BMI) 33.0-33.9, adult: Secondary | ICD-10-CM | POA: Diagnosis not present

## 2019-06-26 DIAGNOSIS — Z7952 Long term (current) use of systemic steroids: Secondary | ICD-10-CM | POA: Diagnosis not present

## 2019-06-26 DIAGNOSIS — T398X5A Adverse effect of other nonopioid analgesics and antipyretics, not elsewhere classified, initial encounter: Secondary | ICD-10-CM | POA: Diagnosis not present

## 2019-06-26 DIAGNOSIS — G44209 Tension-type headache, unspecified, not intractable: Secondary | ICD-10-CM | POA: Diagnosis not present

## 2019-06-26 DIAGNOSIS — R079 Chest pain, unspecified: Secondary | ICD-10-CM | POA: Diagnosis not present

## 2019-06-26 DIAGNOSIS — Z9889 Other specified postprocedural states: Secondary | ICD-10-CM | POA: Diagnosis not present

## 2019-06-26 DIAGNOSIS — R519 Headache, unspecified: Secondary | ICD-10-CM | POA: Diagnosis not present

## 2019-06-26 DIAGNOSIS — Z79899 Other long term (current) drug therapy: Secondary | ICD-10-CM | POA: Diagnosis not present

## 2019-06-29 DIAGNOSIS — Z940 Kidney transplant status: Secondary | ICD-10-CM | POA: Diagnosis not present

## 2019-06-29 DIAGNOSIS — E785 Hyperlipidemia, unspecified: Secondary | ICD-10-CM | POA: Diagnosis not present

## 2019-06-29 DIAGNOSIS — Z79899 Other long term (current) drug therapy: Secondary | ICD-10-CM | POA: Diagnosis not present

## 2019-06-29 DIAGNOSIS — N184 Chronic kidney disease, stage 4 (severe): Secondary | ICD-10-CM | POA: Diagnosis not present

## 2019-07-06 DIAGNOSIS — M25562 Pain in left knee: Secondary | ICD-10-CM | POA: Diagnosis not present

## 2019-07-06 DIAGNOSIS — R2689 Other abnormalities of gait and mobility: Secondary | ICD-10-CM | POA: Diagnosis not present

## 2019-07-06 DIAGNOSIS — M6281 Muscle weakness (generalized): Secondary | ICD-10-CM | POA: Diagnosis not present

## 2019-07-06 DIAGNOSIS — M25662 Stiffness of left knee, not elsewhere classified: Secondary | ICD-10-CM | POA: Diagnosis not present

## 2019-07-14 DIAGNOSIS — N184 Chronic kidney disease, stage 4 (severe): Secondary | ICD-10-CM | POA: Diagnosis not present

## 2019-07-14 DIAGNOSIS — N390 Urinary tract infection, site not specified: Secondary | ICD-10-CM | POA: Diagnosis not present

## 2019-07-14 DIAGNOSIS — H8110 Benign paroxysmal vertigo, unspecified ear: Secondary | ICD-10-CM | POA: Diagnosis not present

## 2019-07-14 DIAGNOSIS — G43009 Migraine without aura, not intractable, without status migrainosus: Secondary | ICD-10-CM | POA: Diagnosis not present

## 2019-07-16 DIAGNOSIS — R197 Diarrhea, unspecified: Secondary | ICD-10-CM | POA: Diagnosis not present

## 2019-07-16 DIAGNOSIS — N184 Chronic kidney disease, stage 4 (severe): Secondary | ICD-10-CM | POA: Diagnosis not present

## 2019-07-16 DIAGNOSIS — Z940 Kidney transplant status: Secondary | ICD-10-CM | POA: Diagnosis not present

## 2019-07-16 DIAGNOSIS — N189 Chronic kidney disease, unspecified: Secondary | ICD-10-CM | POA: Diagnosis not present

## 2019-07-16 DIAGNOSIS — I10 Essential (primary) hypertension: Secondary | ICD-10-CM | POA: Diagnosis not present

## 2019-07-16 DIAGNOSIS — D631 Anemia in chronic kidney disease: Secondary | ICD-10-CM | POA: Diagnosis not present

## 2019-07-17 DIAGNOSIS — Z79899 Other long term (current) drug therapy: Secondary | ICD-10-CM | POA: Diagnosis not present

## 2019-07-17 DIAGNOSIS — R197 Diarrhea, unspecified: Secondary | ICD-10-CM | POA: Diagnosis not present

## 2019-07-17 DIAGNOSIS — N184 Chronic kidney disease, stage 4 (severe): Secondary | ICD-10-CM | POA: Diagnosis not present

## 2019-07-17 DIAGNOSIS — I10 Essential (primary) hypertension: Secondary | ICD-10-CM | POA: Diagnosis not present

## 2019-07-18 DIAGNOSIS — Z940 Kidney transplant status: Secondary | ICD-10-CM | POA: Diagnosis not present

## 2019-07-18 DIAGNOSIS — E860 Dehydration: Secondary | ICD-10-CM | POA: Diagnosis not present

## 2019-07-18 DIAGNOSIS — N3001 Acute cystitis with hematuria: Secondary | ICD-10-CM | POA: Diagnosis not present

## 2019-07-22 DIAGNOSIS — M25662 Stiffness of left knee, not elsewhere classified: Secondary | ICD-10-CM | POA: Diagnosis not present

## 2019-07-22 DIAGNOSIS — M6281 Muscle weakness (generalized): Secondary | ICD-10-CM | POA: Diagnosis not present

## 2019-07-22 DIAGNOSIS — M25562 Pain in left knee: Secondary | ICD-10-CM | POA: Diagnosis not present

## 2019-07-22 DIAGNOSIS — R2689 Other abnormalities of gait and mobility: Secondary | ICD-10-CM | POA: Diagnosis not present

## 2019-07-27 DIAGNOSIS — D649 Anemia, unspecified: Secondary | ICD-10-CM | POA: Diagnosis not present

## 2019-07-29 DIAGNOSIS — M109 Gout, unspecified: Secondary | ICD-10-CM | POA: Diagnosis not present

## 2019-07-29 DIAGNOSIS — N184 Chronic kidney disease, stage 4 (severe): Secondary | ICD-10-CM | POA: Diagnosis not present

## 2019-07-29 DIAGNOSIS — M10071 Idiopathic gout, right ankle and foot: Secondary | ICD-10-CM | POA: Diagnosis not present

## 2019-07-29 DIAGNOSIS — I10 Essential (primary) hypertension: Secondary | ICD-10-CM | POA: Diagnosis not present

## 2019-07-29 DIAGNOSIS — Z940 Kidney transplant status: Secondary | ICD-10-CM | POA: Diagnosis not present

## 2019-07-29 DIAGNOSIS — F17200 Nicotine dependence, unspecified, uncomplicated: Secondary | ICD-10-CM | POA: Diagnosis not present

## 2019-07-29 DIAGNOSIS — Z79899 Other long term (current) drug therapy: Secondary | ICD-10-CM | POA: Diagnosis not present

## 2019-07-29 DIAGNOSIS — R197 Diarrhea, unspecified: Secondary | ICD-10-CM | POA: Diagnosis not present

## 2019-08-06 ENCOUNTER — Other Ambulatory Visit: Payer: Self-pay | Admitting: Nephrology

## 2019-08-06 DIAGNOSIS — T82868D Thrombosis of vascular prosthetic devices, implants and grafts, subsequent encounter: Secondary | ICD-10-CM

## 2019-08-06 DIAGNOSIS — D631 Anemia in chronic kidney disease: Secondary | ICD-10-CM | POA: Diagnosis not present

## 2019-08-06 DIAGNOSIS — N189 Chronic kidney disease, unspecified: Secondary | ICD-10-CM | POA: Diagnosis not present

## 2019-08-06 DIAGNOSIS — D649 Anemia, unspecified: Secondary | ICD-10-CM | POA: Diagnosis not present

## 2019-08-10 DIAGNOSIS — L299 Pruritus, unspecified: Secondary | ICD-10-CM | POA: Diagnosis not present

## 2019-08-10 DIAGNOSIS — H6123 Impacted cerumen, bilateral: Secondary | ICD-10-CM | POA: Diagnosis not present

## 2019-08-10 DIAGNOSIS — H61303 Acquired stenosis of external ear canal, unspecified, bilateral: Secondary | ICD-10-CM | POA: Diagnosis not present

## 2019-08-10 DIAGNOSIS — J342 Deviated nasal septum: Secondary | ICD-10-CM | POA: Diagnosis not present

## 2019-08-18 DIAGNOSIS — F419 Anxiety disorder, unspecified: Secondary | ICD-10-CM | POA: Diagnosis not present

## 2019-08-18 DIAGNOSIS — N183 Chronic kidney disease, stage 3 unspecified: Secondary | ICD-10-CM | POA: Diagnosis not present

## 2019-08-18 DIAGNOSIS — Z6833 Body mass index (BMI) 33.0-33.9, adult: Secondary | ICD-10-CM | POA: Diagnosis not present

## 2019-08-18 DIAGNOSIS — F339 Major depressive disorder, recurrent, unspecified: Secondary | ICD-10-CM | POA: Diagnosis not present

## 2019-10-07 DIAGNOSIS — D649 Anemia, unspecified: Secondary | ICD-10-CM | POA: Diagnosis not present

## 2019-10-07 DIAGNOSIS — Z7901 Long term (current) use of anticoagulants: Secondary | ICD-10-CM | POA: Diagnosis not present

## 2019-10-20 DIAGNOSIS — M79671 Pain in right foot: Secondary | ICD-10-CM | POA: Diagnosis not present

## 2019-10-20 DIAGNOSIS — M109 Gout, unspecified: Secondary | ICD-10-CM | POA: Diagnosis not present

## 2019-10-23 DIAGNOSIS — Z79899 Other long term (current) drug therapy: Secondary | ICD-10-CM | POA: Diagnosis not present

## 2019-10-27 DIAGNOSIS — D631 Anemia in chronic kidney disease: Secondary | ICD-10-CM | POA: Diagnosis not present

## 2019-10-27 DIAGNOSIS — Z940 Kidney transplant status: Secondary | ICD-10-CM | POA: Diagnosis not present

## 2019-10-28 DIAGNOSIS — R0789 Other chest pain: Secondary | ICD-10-CM | POA: Diagnosis not present

## 2019-10-28 DIAGNOSIS — I129 Hypertensive chronic kidney disease with stage 1 through stage 4 chronic kidney disease, or unspecified chronic kidney disease: Secondary | ICD-10-CM | POA: Diagnosis not present

## 2019-10-28 DIAGNOSIS — R197 Diarrhea, unspecified: Secondary | ICD-10-CM | POA: Diagnosis not present

## 2019-10-28 DIAGNOSIS — E872 Acidosis: Secondary | ICD-10-CM | POA: Diagnosis not present

## 2019-10-28 DIAGNOSIS — N179 Acute kidney failure, unspecified: Secondary | ICD-10-CM | POA: Diagnosis not present

## 2019-10-28 DIAGNOSIS — N184 Chronic kidney disease, stage 4 (severe): Secondary | ICD-10-CM | POA: Diagnosis not present

## 2019-10-28 DIAGNOSIS — Z940 Kidney transplant status: Secondary | ICD-10-CM | POA: Diagnosis not present

## 2019-10-30 DIAGNOSIS — J40 Bronchitis, not specified as acute or chronic: Secondary | ICD-10-CM | POA: Diagnosis not present

## 2019-10-30 DIAGNOSIS — R0789 Other chest pain: Secondary | ICD-10-CM | POA: Diagnosis not present

## 2019-10-30 DIAGNOSIS — D649 Anemia, unspecified: Secondary | ICD-10-CM | POA: Diagnosis not present

## 2019-10-30 DIAGNOSIS — R079 Chest pain, unspecified: Secondary | ICD-10-CM | POA: Diagnosis not present

## 2019-10-30 DIAGNOSIS — N289 Disorder of kidney and ureter, unspecified: Secondary | ICD-10-CM | POA: Diagnosis not present

## 2019-10-30 DIAGNOSIS — E860 Dehydration: Secondary | ICD-10-CM | POA: Diagnosis not present

## 2019-10-30 DIAGNOSIS — Z940 Kidney transplant status: Secondary | ICD-10-CM | POA: Diagnosis not present

## 2019-10-30 DIAGNOSIS — R109 Unspecified abdominal pain: Secondary | ICD-10-CM | POA: Diagnosis not present

## 2019-10-31 DIAGNOSIS — E860 Dehydration: Secondary | ICD-10-CM | POA: Diagnosis not present

## 2019-10-31 DIAGNOSIS — R079 Chest pain, unspecified: Secondary | ICD-10-CM | POA: Diagnosis not present

## 2019-10-31 DIAGNOSIS — D649 Anemia, unspecified: Secondary | ICD-10-CM | POA: Diagnosis not present

## 2019-10-31 DIAGNOSIS — N289 Disorder of kidney and ureter, unspecified: Secondary | ICD-10-CM | POA: Diagnosis not present

## 2019-11-01 DIAGNOSIS — R739 Hyperglycemia, unspecified: Secondary | ICD-10-CM | POA: Diagnosis present

## 2019-11-01 DIAGNOSIS — D649 Anemia, unspecified: Secondary | ICD-10-CM | POA: Diagnosis not present

## 2019-11-01 DIAGNOSIS — Z79891 Long term (current) use of opiate analgesic: Secondary | ICD-10-CM | POA: Diagnosis not present

## 2019-11-01 DIAGNOSIS — N251 Nephrogenic diabetes insipidus: Secondary | ICD-10-CM | POA: Diagnosis present

## 2019-11-01 DIAGNOSIS — N184 Chronic kidney disease, stage 4 (severe): Secondary | ICD-10-CM | POA: Diagnosis not present

## 2019-11-01 DIAGNOSIS — R072 Precordial pain: Secondary | ICD-10-CM | POA: Diagnosis present

## 2019-11-01 DIAGNOSIS — E7800 Pure hypercholesterolemia, unspecified: Secondary | ICD-10-CM | POA: Diagnosis present

## 2019-11-01 DIAGNOSIS — K219 Gastro-esophageal reflux disease without esophagitis: Secondary | ICD-10-CM | POA: Diagnosis present

## 2019-11-01 DIAGNOSIS — N179 Acute kidney failure, unspecified: Secondary | ICD-10-CM | POA: Diagnosis present

## 2019-11-01 DIAGNOSIS — R079 Chest pain, unspecified: Secondary | ICD-10-CM | POA: Diagnosis not present

## 2019-11-01 DIAGNOSIS — R0602 Shortness of breath: Secondary | ICD-10-CM | POA: Diagnosis not present

## 2019-11-01 DIAGNOSIS — E872 Acidosis: Secondary | ICD-10-CM | POA: Diagnosis present

## 2019-11-01 DIAGNOSIS — F17210 Nicotine dependence, cigarettes, uncomplicated: Secondary | ICD-10-CM | POA: Diagnosis present

## 2019-11-01 DIAGNOSIS — Z792 Long term (current) use of antibiotics: Secondary | ICD-10-CM | POA: Diagnosis not present

## 2019-11-01 DIAGNOSIS — Z79899 Other long term (current) drug therapy: Secondary | ICD-10-CM | POA: Diagnosis not present

## 2019-11-01 DIAGNOSIS — E860 Dehydration: Secondary | ICD-10-CM | POA: Diagnosis not present

## 2019-11-01 DIAGNOSIS — R42 Dizziness and giddiness: Secondary | ICD-10-CM | POA: Diagnosis present

## 2019-11-01 DIAGNOSIS — F418 Other specified anxiety disorders: Secondary | ICD-10-CM | POA: Diagnosis present

## 2019-11-01 DIAGNOSIS — Z888 Allergy status to other drugs, medicaments and biological substances status: Secondary | ICD-10-CM | POA: Diagnosis not present

## 2019-11-01 DIAGNOSIS — R5383 Other fatigue: Secondary | ICD-10-CM | POA: Diagnosis present

## 2019-11-01 DIAGNOSIS — Z881 Allergy status to other antibiotic agents status: Secondary | ICD-10-CM | POA: Diagnosis not present

## 2019-11-01 DIAGNOSIS — I129 Hypertensive chronic kidney disease with stage 1 through stage 4 chronic kidney disease, or unspecified chronic kidney disease: Secondary | ICD-10-CM | POA: Diagnosis present

## 2019-11-01 DIAGNOSIS — D631 Anemia in chronic kidney disease: Secondary | ICD-10-CM | POA: Diagnosis present

## 2019-11-01 DIAGNOSIS — E039 Hypothyroidism, unspecified: Secondary | ICD-10-CM | POA: Diagnosis present

## 2019-11-01 DIAGNOSIS — Z940 Kidney transplant status: Secondary | ICD-10-CM | POA: Diagnosis not present

## 2019-11-01 DIAGNOSIS — N289 Disorder of kidney and ureter, unspecified: Secondary | ICD-10-CM | POA: Diagnosis not present

## 2019-11-01 DIAGNOSIS — Z7952 Long term (current) use of systemic steroids: Secondary | ICD-10-CM | POA: Diagnosis not present

## 2019-11-02 DIAGNOSIS — R079 Chest pain, unspecified: Secondary | ICD-10-CM | POA: Diagnosis not present

## 2019-11-02 DIAGNOSIS — N289 Disorder of kidney and ureter, unspecified: Secondary | ICD-10-CM | POA: Diagnosis not present

## 2019-11-02 DIAGNOSIS — E860 Dehydration: Secondary | ICD-10-CM | POA: Diagnosis not present

## 2019-11-02 DIAGNOSIS — D649 Anemia, unspecified: Secondary | ICD-10-CM | POA: Diagnosis not present

## 2019-11-04 DIAGNOSIS — N189 Chronic kidney disease, unspecified: Secondary | ICD-10-CM | POA: Diagnosis not present

## 2019-11-04 DIAGNOSIS — D631 Anemia in chronic kidney disease: Secondary | ICD-10-CM | POA: Diagnosis not present

## 2019-11-04 DIAGNOSIS — I82629 Acute embolism and thrombosis of deep veins of unspecified upper extremity: Secondary | ICD-10-CM | POA: Diagnosis not present

## 2019-11-05 ENCOUNTER — Other Ambulatory Visit: Payer: Self-pay | Admitting: Nephrology

## 2019-11-05 DIAGNOSIS — R197 Diarrhea, unspecified: Secondary | ICD-10-CM

## 2019-11-05 DIAGNOSIS — Z940 Kidney transplant status: Secondary | ICD-10-CM

## 2019-11-06 DIAGNOSIS — R509 Fever, unspecified: Secondary | ICD-10-CM | POA: Diagnosis not present

## 2019-11-06 DIAGNOSIS — N3090 Cystitis, unspecified without hematuria: Secondary | ICD-10-CM | POA: Diagnosis not present

## 2019-11-06 DIAGNOSIS — M79671 Pain in right foot: Secondary | ICD-10-CM | POA: Diagnosis not present

## 2019-11-06 DIAGNOSIS — N3001 Acute cystitis with hematuria: Secondary | ICD-10-CM | POA: Diagnosis not present

## 2019-11-09 DIAGNOSIS — R1013 Epigastric pain: Secondary | ICD-10-CM | POA: Diagnosis not present

## 2019-11-09 DIAGNOSIS — K591 Functional diarrhea: Secondary | ICD-10-CM | POA: Diagnosis not present

## 2019-11-10 ENCOUNTER — Ambulatory Visit (INDEPENDENT_AMBULATORY_CARE_PROVIDER_SITE_OTHER): Payer: Medicare Other | Admitting: Podiatry

## 2019-11-10 ENCOUNTER — Other Ambulatory Visit: Payer: Self-pay

## 2019-11-10 ENCOUNTER — Other Ambulatory Visit: Payer: Self-pay | Admitting: Podiatry

## 2019-11-10 ENCOUNTER — Ambulatory Visit (INDEPENDENT_AMBULATORY_CARE_PROVIDER_SITE_OTHER): Payer: Medicare Other

## 2019-11-10 DIAGNOSIS — M779 Enthesopathy, unspecified: Secondary | ICD-10-CM

## 2019-11-10 DIAGNOSIS — M79671 Pain in right foot: Secondary | ICD-10-CM | POA: Diagnosis not present

## 2019-11-10 DIAGNOSIS — M25571 Pain in right ankle and joints of right foot: Secondary | ICD-10-CM | POA: Diagnosis not present

## 2019-11-10 DIAGNOSIS — M76821 Posterior tibial tendinitis, right leg: Secondary | ICD-10-CM

## 2019-11-10 NOTE — Progress Notes (Signed)
  Subjective:  Patient ID: Ariel [name], female    DOB: 1976/04/13,  MRN: [id number]  No chief complaint on file.   44 y.o. female presents for right foot pain.  Reports right medial arch pain and medial foot pain below the ankle for the past 2 weeks.  Reports 2 episodes of severe pain.  Denies injury.  Reports 1010 sharp occasional pain.  Worse with movement.  Denies swelling.  Tests trialed boot and Ace wrap without relief.  Went to urgent care last Friday for the same issue was told it was a nerve issue was given hydrocodone and states that it help with the pain.  Objective:  Physical Exam: warm, good capillary refill, no trophic changes or ulcerative lesions, normal DP and PT pulses and normal sensory exam.  Right Foot: pain at right PT Tendon from medial malleolus to insertion. Pain on ROM, no edema. Arch intact   No images are attached to the encounter.  Radiographs: X-ray of the right foot: no fracture, dislocation, swelling or degenerative changes noted and vascular calcifications noted   Assessment:   1. Posterior tibial tendinitis of right lower extremity   2. Right foot pain      Plan:  Patient was evaluated and treated and all questions answered.  Tendonitis -X-rays reviewed as above -Dispensed Tri-Lock ankle brace.  Patient educated on use  -Avoid NSAIDs given renal hx. -Offered CMOs given recurrent issues with both PT tendons. Declined -F/u in 6 weeks for recheck.  Return in about 6 weeks (around 12/22/2019) for Tendonitis, Right.

## 2019-11-11 DIAGNOSIS — Z940 Kidney transplant status: Secondary | ICD-10-CM | POA: Diagnosis not present

## 2019-11-11 DIAGNOSIS — I10 Essential (primary) hypertension: Secondary | ICD-10-CM | POA: Diagnosis not present

## 2019-11-11 DIAGNOSIS — N184 Chronic kidney disease, stage 4 (severe): Secondary | ICD-10-CM | POA: Diagnosis not present

## 2019-11-11 DIAGNOSIS — Z79899 Other long term (current) drug therapy: Secondary | ICD-10-CM | POA: Diagnosis not present

## 2019-11-12 ENCOUNTER — Other Ambulatory Visit: Payer: Self-pay

## 2019-11-12 ENCOUNTER — Ambulatory Visit (INDEPENDENT_AMBULATORY_CARE_PROVIDER_SITE_OTHER): Payer: Medicare Other | Admitting: Sports Medicine

## 2019-11-12 ENCOUNTER — Ambulatory Visit (INDEPENDENT_AMBULATORY_CARE_PROVIDER_SITE_OTHER): Payer: Medicare Other

## 2019-11-12 ENCOUNTER — Other Ambulatory Visit: Payer: Self-pay | Admitting: Sports Medicine

## 2019-11-12 ENCOUNTER — Encounter: Payer: Self-pay | Admitting: Sports Medicine

## 2019-11-12 DIAGNOSIS — D649 Anemia, unspecified: Secondary | ICD-10-CM | POA: Diagnosis not present

## 2019-11-12 DIAGNOSIS — M779 Enthesopathy, unspecified: Secondary | ICD-10-CM | POA: Diagnosis not present

## 2019-11-12 DIAGNOSIS — M79671 Pain in right foot: Secondary | ICD-10-CM | POA: Diagnosis not present

## 2019-11-12 DIAGNOSIS — R600 Localized edema: Secondary | ICD-10-CM

## 2019-11-12 DIAGNOSIS — N183 Chronic kidney disease, stage 3 unspecified: Secondary | ICD-10-CM | POA: Diagnosis not present

## 2019-11-12 DIAGNOSIS — M25571 Pain in right ankle and joints of right foot: Secondary | ICD-10-CM | POA: Diagnosis not present

## 2019-11-12 DIAGNOSIS — I10 Essential (primary) hypertension: Secondary | ICD-10-CM | POA: Diagnosis not present

## 2019-11-12 DIAGNOSIS — T8612 Kidney transplant failure: Secondary | ICD-10-CM

## 2019-11-12 DIAGNOSIS — M79676 Pain in unspecified toe(s): Secondary | ICD-10-CM

## 2019-11-12 DIAGNOSIS — Z79899 Other long term (current) drug therapy: Secondary | ICD-10-CM | POA: Diagnosis not present

## 2019-11-12 MED ORDER — TRIAMCINOLONE ACETONIDE 10 MG/ML IJ SUSP
10.0000 mg | Freq: Once | INTRAMUSCULAR | Status: AC
  Administered 2019-11-12: 10 mg

## 2019-11-12 MED ORDER — HYDROCODONE-ACETAMINOPHEN 10-325 MG PO TABS: 1.0000 | ORAL_TABLET | Freq: Four times a day (QID) | ORAL | 0 refills | Status: AC | PRN

## 2019-11-12 NOTE — Progress Notes (Signed)
Subjective: Ariel [name] is a 44 y.o. female patient who presents to office for evaluation of severe right foot pain since after last visit with Dr. March Rummage.  Reports that she is having severe pain in all toe joints especially the big toe joint and at the ankle on both the medial and lateral side pain is so bad that she can barely walk with significant swelling pain is sharp 10 out of 10 has tried icing resting Tylenol and slowly is getting worse and is aggravated by weightbearing.  Patient denies any injury or any acute symptoms but does state that her kidneys have slowly been getting worse over the last 2 weeks and she is on prednisone for a transplant.  Patient Active Problem List   Diagnosis Date Noted  . Dehydration 01/19/2019  . Nausea and vomiting 01/19/2019  . Anemia 01/19/2019  . 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 Dyspnea on exertion 01/19/2019  . HTN (hypertension) 01/19/2019  . Hypothyroid 01/19/2019  . Murmur 01/19/2019  . Unstable angina ([place]) 01/19/2019  . Anxiety 10/29/2018  . 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 History of parathyroidectomy 10/29/2018  . Immunosuppressive management encounter following kidney transplant 10/29/2018  . Other chronic sinusitis 10/29/2018  . Right lower quadrant abdominal pain 10/29/2018  . 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 Transplanted organ removal status 10/29/2018  . Yeast dermatitis 08/14/2018  . Plantar fasciitis 07/03/2018  . Sesamoiditis 07/03/2018  . 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 Pruritus ani 06/26/2018  . Internal hemorrhoid 04/22/2018  . Perianal lesion 04/22/2018  . Abdominal distension (gaseous) 04/16/2018  . Irregular uterine bleeding 02/13/2016  . Dysmenorrhea 10/19/2015  . Iron deficiency anemia 10/19/2015  . Mechanical complication of other vascular device, implant, and graft 09/02/2012  . End stage renal disease ([place]) 09/02/2012  . Complications due to cardiac device,  implant, and graft 09/02/2012  . DVT of axillary vein, acute left ([place]) 08/02/2012  . S/p cadaver renal transplant 08/02/2012  . Hyperlipidemia 08/02/2012  . Phlebitis and thrombophlebitis of other sites 08/02/2012    Current Outpatient Medications on File Prior to Visit  Medication Sig Dispense Refill  . ALPRAZolam (XANAX) 0.5 MG tablet Take 1 tablet at bedtime  1  . amoxicillin-clavulanate (AUGMENTIN) 500-125 MG tablet TK 1 T PO BID    . carvedilol (COREG) 12.5 MG tablet Take 12.5 mg by mouth 2 (two) times daily.  3  . clindamycin (CLEOCIN) 300 MG capsule Take 300 mg by mouth.    . famotidine (PEPCID) 20 MG tablet Take by mouth.    . famotidine (PEPCID) 20 MG tablet     . furosemide (LASIX) 40 MG tablet Take 40 mg by mouth daily.    . Magnesium 200 MG TABS Take 1 tablet by mouth daily.      . medroxyPROGESTERone (PROVERA) 10 MG tablet Take 10 mg by mouth daily.  3  . mycophenolate (MYFORTIC) 360 MG TBEC EC tablet     . ondansetron (ZOFRAN-ODT) 4 MG disintegrating tablet     . oxyCODONE (OXY IR/ROXICODONE) 5 MG immediate release tablet TK 1 T PO Q 6 H PRF PAIN    . Potassium Chloride ER 20 MEQ TBCR Take 20 mEq by mouth daily.  6  . predniSONE (DELTASONE) 5 MG tablet Take 5 mg by mouth daily.      . sertraline (ZOLOFT) 100 MG tablet Take 100 mg by mouth daily.  5  . sertraline (ZOLOFT) 50 MG tablet TK 1 T PO  D    . simvastatin (ZOCOR) 10 MG tablet TK 1 T PO  D    . tacrolimus (PROGRAF) 0.5 MG capsule Take 0.5 mg by mouth 2 (two) times daily.  6  . tacrolimus (PROGRAF) 1 MG capsule Take 1 mg by mouth 2 (two) times daily.  6  . traZODone (DESYREL) 50 MG tablet TK 1 T PO HS PRF SLP     No current facility-administered medications on file prior to visit.    Allergies  Allergen Reactions  . Bactrim [Sulfamethoxazole-Trimethoprim] Anaphylaxis  . Sulfamethoxazole   . Other Hives and Rash  . Azathioprine Itching  . Keflex [Cephalexin] Swelling    Per pt, her face and lips swell up and  she develops hives  . Adhesive [Tape] Rash  . Bactrim Swelling and Rash  . Imuran [Azathioprine Sodium] Rash  . Tramadol Rash  . Warfarin Sodium Rash    Objective:  General: Alert and oriented x3 in no acute distress  Dermatology: No open lesions bilateral lower extremities, no webspace macerations, no ecchymosis bilateral, all nails x 10 are well manicured.  Vascular: Dorsalis Pedis and Posterior Tibial pedal pulses palpable, Capillary Fill Time 3 seconds,(+) pedal hair growth bilateral, no edema bilateral lower extremities, Temperature gradient within normal limits.  Neurology: Gross sensation intact via light touch bilateral, hypersensitivity to touch to the right foot.  Musculoskeletal: Moderate tenderness to palpation at medial and lateral ankle greater than the first metatarsophalangeal joint and dorsal lateral foot and lesser MPJs on the right with focal erythema isolated over these respective joints and some increase in warmth suspicious of acute inflammatory episode with hyperuricemia in multiple joints secondary to kidney issues  Gait: Antalgic wheelchair assisted gait  Xrays  Right foot: Mild soft tissue swelling, no acute osseous finding.   Impression:  Assessment and Plan: Problem List Items Addressed This Visit      Genitourinary   Failed kidney transplant    Other Visit Diagnoses    Right foot pain    -  Primary   Arthralgia of right ankle       Arthralgia of right foot       Capsulitis       Tendinitis       Localized edema           -Complete examination performed -Xrays reviewed -Discussed treatment options for inflammatory foot pain possibly related to kidney issues -After oral consent and aseptic prep, injected a mixture containing 1 ml of 2%  plain lidocaine, 1 ml 0.5% plain marcaine, 0.5 ml of kenalog 10 and 0.5 ml of dexamethasone phosphate into right ankle at area of maximum pain without complication. Post-injection care discussed with patient.   -Production assistant, radio and advised patient to keep intact for 5 days -Dispensed surgical shoe to help decrease any pressure to the right foot and advised patient to use rolling walker which he already has to help stay off of her foot -Continue with rest ice elevation -Prescribed hydrocodone for patient to take for severe episodes of pain -Patient to return to office as scheduled for follow-up with Dr. March Rummage or sooner if condition worsens.  Landis Martins, DPM

## 2019-11-14 DIAGNOSIS — N281 Cyst of kidney, acquired: Secondary | ICD-10-CM | POA: Diagnosis not present

## 2019-11-14 DIAGNOSIS — R188 Other ascites: Secondary | ICD-10-CM | POA: Diagnosis not present

## 2019-11-14 DIAGNOSIS — Z940 Kidney transplant status: Secondary | ICD-10-CM | POA: Diagnosis not present

## 2019-11-14 DIAGNOSIS — R197 Diarrhea, unspecified: Secondary | ICD-10-CM | POA: Diagnosis not present

## 2019-11-14 DIAGNOSIS — R109 Unspecified abdominal pain: Secondary | ICD-10-CM | POA: Diagnosis not present

## 2019-11-16 DIAGNOSIS — F419 Anxiety disorder, unspecified: Secondary | ICD-10-CM | POA: Diagnosis not present

## 2019-11-16 DIAGNOSIS — F339 Major depressive disorder, recurrent, unspecified: Secondary | ICD-10-CM | POA: Diagnosis not present

## 2019-11-16 DIAGNOSIS — M79673 Pain in unspecified foot: Secondary | ICD-10-CM | POA: Diagnosis not present

## 2019-11-16 DIAGNOSIS — G4700 Insomnia, unspecified: Secondary | ICD-10-CM | POA: Diagnosis not present

## 2019-11-17 ENCOUNTER — Other Ambulatory Visit: Payer: Self-pay

## 2019-11-17 ENCOUNTER — Ambulatory Visit (INDEPENDENT_AMBULATORY_CARE_PROVIDER_SITE_OTHER): Payer: Medicare Other | Admitting: Podiatry

## 2019-11-17 DIAGNOSIS — M79671 Pain in right foot: Secondary | ICD-10-CM

## 2019-11-17 DIAGNOSIS — M779 Enthesopathy, unspecified: Secondary | ICD-10-CM

## 2019-11-17 DIAGNOSIS — M25571 Pain in right ankle and joints of right foot: Secondary | ICD-10-CM | POA: Diagnosis not present

## 2019-11-17 NOTE — Progress Notes (Signed)
  Subjective:  Patient ID: Ariel [name], female    DOB: 03-31-1976,  MRN: [id number]  Chief Complaint  Patient presents with  . Tendonitis    F/U Rt tendonitis pt. states," been better, ut when I walk I can feel al ittle tender bu other than that it's fine; 1/10.' tx: elevaiton -pt denies swllgin -pt states she left unna boot intact for 5-7 days    44 y.o. female presents for right foot pain.     Objective:  Physical Exam: warm, good capillary refill, no trophic changes or ulcerative lesions, normal DP and PT pulses and normal sensory exam.  Right Foot: no pain, edema today    Assessment:   1. Arthralgia of right ankle   2. Arthralgia of right foot   3. Right foot pain   4. Capsulitis   5. Tendinitis      Plan:  Patient was evaluated and treated and all questions answered.  Tendonitis -Pain, edema resolved. -F/u PRN for recurrence. -Discussed should issues persist to wear her boot and come in for eval.  No follow-ups on file.

## 2019-11-24 ENCOUNTER — Ambulatory Visit
Admission: RE | Admit: 2019-11-24 | Discharge: 2019-11-24 | Disposition: A | Discharge status: Home / Self Care | Payer: Medicare Other | Source: Ambulatory Visit | Attending: Nephrology | Admitting: Nephrology

## 2019-11-24 ENCOUNTER — Other Ambulatory Visit: Payer: Self-pay

## 2019-11-24 DIAGNOSIS — Z940 Kidney transplant status: Secondary | ICD-10-CM

## 2019-11-24 DIAGNOSIS — R188 Other ascites: Secondary | ICD-10-CM | POA: Diagnosis not present

## 2019-11-24 DIAGNOSIS — R197 Diarrhea, unspecified: Secondary | ICD-10-CM

## 2019-11-25 ENCOUNTER — Other Ambulatory Visit: Payer: Self-pay | Admitting: Sports Medicine

## 2019-11-25 ENCOUNTER — Other Ambulatory Visit: Payer: Self-pay | Admitting: Podiatry

## 2019-11-25 DIAGNOSIS — M779 Enthesopathy, unspecified: Secondary | ICD-10-CM

## 2019-11-25 DIAGNOSIS — M25571 Pain in right ankle and joints of right foot: Secondary | ICD-10-CM

## 2019-12-09 DIAGNOSIS — I129 Hypertensive chronic kidney disease with stage 1 through stage 4 chronic kidney disease, or unspecified chronic kidney disease: Secondary | ICD-10-CM | POA: Diagnosis not present

## 2019-12-09 DIAGNOSIS — E872 Acidosis: Secondary | ICD-10-CM | POA: Diagnosis not present

## 2019-12-09 DIAGNOSIS — R300 Dysuria: Secondary | ICD-10-CM | POA: Diagnosis not present

## 2019-12-09 DIAGNOSIS — N184 Chronic kidney disease, stage 4 (severe): Secondary | ICD-10-CM | POA: Diagnosis not present

## 2019-12-09 DIAGNOSIS — K219 Gastro-esophageal reflux disease without esophagitis: Secondary | ICD-10-CM | POA: Diagnosis not present

## 2019-12-09 DIAGNOSIS — N179 Acute kidney failure, unspecified: Secondary | ICD-10-CM | POA: Diagnosis not present

## 2019-12-09 DIAGNOSIS — Z940 Kidney transplant status: Secondary | ICD-10-CM | POA: Diagnosis not present

## 2019-12-09 DIAGNOSIS — K591 Functional diarrhea: Secondary | ICD-10-CM | POA: Diagnosis not present

## 2019-12-09 DIAGNOSIS — D631 Anemia in chronic kidney disease: Secondary | ICD-10-CM | POA: Diagnosis not present

## 2019-12-09 DIAGNOSIS — N2581 Secondary hyperparathyroidism of renal origin: Secondary | ICD-10-CM | POA: Diagnosis not present

## 2019-12-17 DIAGNOSIS — H61303 Acquired stenosis of external ear canal, unspecified, bilateral: Secondary | ICD-10-CM | POA: Diagnosis not present

## 2019-12-17 DIAGNOSIS — L299 Pruritus, unspecified: Secondary | ICD-10-CM | POA: Diagnosis not present

## 2019-12-17 DIAGNOSIS — H6123 Impacted cerumen, bilateral: Secondary | ICD-10-CM | POA: Diagnosis not present

## 2019-12-22 ENCOUNTER — Ambulatory Visit: Payer: Medicare Other | Admitting: Podiatry

## 2019-12-23 DIAGNOSIS — R109 Unspecified abdominal pain: Secondary | ICD-10-CM | POA: Diagnosis not present

## 2019-12-23 DIAGNOSIS — K219 Gastro-esophageal reflux disease without esophagitis: Secondary | ICD-10-CM | POA: Diagnosis present

## 2019-12-23 DIAGNOSIS — D631 Anemia in chronic kidney disease: Secondary | ICD-10-CM | POA: Diagnosis present

## 2019-12-23 DIAGNOSIS — R188 Other ascites: Secondary | ICD-10-CM | POA: Diagnosis present

## 2019-12-23 DIAGNOSIS — E785 Hyperlipidemia, unspecified: Secondary | ICD-10-CM | POA: Diagnosis present

## 2019-12-23 DIAGNOSIS — G4489 Other headache syndrome: Secondary | ICD-10-CM | POA: Diagnosis not present

## 2019-12-23 DIAGNOSIS — N251 Nephrogenic diabetes insipidus: Secondary | ICD-10-CM | POA: Diagnosis not present

## 2019-12-23 DIAGNOSIS — E039 Hypothyroidism, unspecified: Secondary | ICD-10-CM | POA: Diagnosis present

## 2019-12-23 DIAGNOSIS — F418 Other specified anxiety disorders: Secondary | ICD-10-CM | POA: Diagnosis present

## 2019-12-23 DIAGNOSIS — R1084 Generalized abdominal pain: Secondary | ICD-10-CM | POA: Diagnosis not present

## 2019-12-23 DIAGNOSIS — Z881 Allergy status to other antibiotic agents status: Secondary | ICD-10-CM | POA: Diagnosis not present

## 2019-12-23 DIAGNOSIS — I129 Hypertensive chronic kidney disease with stage 1 through stage 4 chronic kidney disease, or unspecified chronic kidney disease: Secondary | ICD-10-CM | POA: Diagnosis present

## 2019-12-23 DIAGNOSIS — K828 Other specified diseases of gallbladder: Secondary | ICD-10-CM | POA: Diagnosis not present

## 2019-12-23 DIAGNOSIS — Z79899 Other long term (current) drug therapy: Secondary | ICD-10-CM | POA: Diagnosis not present

## 2019-12-23 DIAGNOSIS — R110 Nausea: Secondary | ICD-10-CM | POA: Diagnosis not present

## 2019-12-23 DIAGNOSIS — Z940 Kidney transplant status: Secondary | ICD-10-CM | POA: Diagnosis not present

## 2019-12-23 DIAGNOSIS — R112 Nausea with vomiting, unspecified: Secondary | ICD-10-CM | POA: Diagnosis not present

## 2019-12-23 DIAGNOSIS — Z7952 Long term (current) use of systemic steroids: Secondary | ICD-10-CM | POA: Diagnosis not present

## 2019-12-23 DIAGNOSIS — Z888 Allergy status to other drugs, medicaments and biological substances status: Secondary | ICD-10-CM | POA: Diagnosis not present

## 2019-12-23 DIAGNOSIS — N184 Chronic kidney disease, stage 4 (severe): Secondary | ICD-10-CM | POA: Diagnosis not present

## 2019-12-23 DIAGNOSIS — I10 Essential (primary) hypertension: Secondary | ICD-10-CM | POA: Diagnosis not present

## 2019-12-23 DIAGNOSIS — F17210 Nicotine dependence, cigarettes, uncomplicated: Secondary | ICD-10-CM | POA: Diagnosis present

## 2019-12-23 DIAGNOSIS — R197 Diarrhea, unspecified: Secondary | ICD-10-CM | POA: Diagnosis not present

## 2019-12-23 DIAGNOSIS — A084 Viral intestinal infection, unspecified: Secondary | ICD-10-CM | POA: Diagnosis not present

## 2019-12-23 DIAGNOSIS — R52 Pain, unspecified: Secondary | ICD-10-CM | POA: Diagnosis not present

## 2019-12-23 DIAGNOSIS — N189 Chronic kidney disease, unspecified: Secondary | ICD-10-CM | POA: Diagnosis not present

## 2019-12-23 DIAGNOSIS — N185 Chronic kidney disease, stage 5: Secondary | ICD-10-CM | POA: Diagnosis not present

## 2019-12-24 DIAGNOSIS — R109 Unspecified abdominal pain: Secondary | ICD-10-CM | POA: Diagnosis not present

## 2019-12-24 DIAGNOSIS — R112 Nausea with vomiting, unspecified: Secondary | ICD-10-CM | POA: Diagnosis not present

## 2019-12-24 DIAGNOSIS — N189 Chronic kidney disease, unspecified: Secondary | ICD-10-CM | POA: Diagnosis not present

## 2019-12-24 DIAGNOSIS — R197 Diarrhea, unspecified: Secondary | ICD-10-CM | POA: Diagnosis not present

## 2019-12-25 DIAGNOSIS — R112 Nausea with vomiting, unspecified: Secondary | ICD-10-CM | POA: Diagnosis not present

## 2019-12-25 DIAGNOSIS — R109 Unspecified abdominal pain: Secondary | ICD-10-CM | POA: Diagnosis not present

## 2019-12-25 DIAGNOSIS — R197 Diarrhea, unspecified: Secondary | ICD-10-CM | POA: Diagnosis not present

## 2019-12-25 DIAGNOSIS — N189 Chronic kidney disease, unspecified: Secondary | ICD-10-CM | POA: Diagnosis not present

## 2020-01-04 DIAGNOSIS — N23 Unspecified renal colic: Secondary | ICD-10-CM | POA: Diagnosis not present

## 2020-01-04 DIAGNOSIS — K8051 Calculus of bile duct without cholangitis or cholecystitis with obstruction: Secondary | ICD-10-CM | POA: Diagnosis not present

## 2020-01-08 ENCOUNTER — Emergency Department (HOSPITAL_COMMUNITY)
Admission: EM | Admit: 2020-01-08 | Discharge: 2020-01-09 | Disposition: A | Discharge status: Home / Self Care | Payer: Medicare Other | Attending: Emergency Medicine | Admitting: Emergency Medicine

## 2020-01-08 ENCOUNTER — Emergency Department (HOSPITAL_COMMUNITY): Payer: Medicare Other

## 2020-01-08 DIAGNOSIS — R10816 Epigastric abdominal tenderness: Secondary | ICD-10-CM | POA: Diagnosis not present

## 2020-01-08 DIAGNOSIS — R110 Nausea: Secondary | ICD-10-CM | POA: Diagnosis not present

## 2020-01-08 DIAGNOSIS — R1011 Right upper quadrant pain: Secondary | ICD-10-CM | POA: Diagnosis not present

## 2020-01-08 DIAGNOSIS — Z940 Kidney transplant status: Secondary | ICD-10-CM | POA: Diagnosis not present

## 2020-01-08 DIAGNOSIS — Z86718 Personal history of other venous thrombosis and embolism: Secondary | ICD-10-CM | POA: Diagnosis not present

## 2020-01-08 DIAGNOSIS — N189 Chronic kidney disease, unspecified: Secondary | ICD-10-CM | POA: Diagnosis not present

## 2020-01-08 DIAGNOSIS — K828 Other specified diseases of gallbladder: Secondary | ICD-10-CM | POA: Diagnosis not present

## 2020-01-08 DIAGNOSIS — Z79899 Other long term (current) drug therapy: Secondary | ICD-10-CM | POA: Insufficient documentation

## 2020-01-08 DIAGNOSIS — I129 Hypertensive chronic kidney disease with stage 1 through stage 4 chronic kidney disease, or unspecified chronic kidney disease: Secondary | ICD-10-CM | POA: Insufficient documentation

## 2020-01-08 DIAGNOSIS — R42 Dizziness and giddiness: Secondary | ICD-10-CM | POA: Diagnosis not present

## 2020-01-08 DIAGNOSIS — K529 Noninfective gastroenteritis and colitis, unspecified: Secondary | ICD-10-CM

## 2020-01-08 DIAGNOSIS — E039 Hypothyroidism, unspecified: Secondary | ICD-10-CM | POA: Diagnosis not present

## 2020-01-08 DIAGNOSIS — N181 Chronic kidney disease, stage 1: Secondary | ICD-10-CM | POA: Diagnosis not present

## 2020-01-08 DIAGNOSIS — R270 Ataxia, unspecified: Secondary | ICD-10-CM | POA: Diagnosis not present

## 2020-01-08 DIAGNOSIS — R197 Diarrhea, unspecified: Secondary | ICD-10-CM | POA: Diagnosis not present

## 2020-01-08 LAB — COMPREHENSIVE METABOLIC PANEL
ALT: 8 U/L (ref 0–44)
AST: 11 U/L — ABNORMAL LOW (ref 15–41)
Albumin: 3.8 g/dL (ref 3.5–5.0)
Alkaline Phosphatase: 50 U/L (ref 38–126)
Anion gap: 10 (ref 5–15)
BUN: 10 mg/dL (ref 6–20)
CO2: 17 mmol/L — ABNORMAL LOW (ref 22–32)
Calcium: 8.7 mg/dL — ABNORMAL LOW (ref 8.9–10.3)
Chloride: 110 mmol/L (ref 98–111)
Creatinine, Ser: 2.59 mg/dL — ABNORMAL HIGH (ref 0.44–1.00)
GFR calc Af Amer: 25 mL/min — ABNORMAL LOW (ref >60)
GFR calc non Af Amer: 22 mL/min — ABNORMAL LOW (ref >60)
Glucose, Bld: 96 mg/dL (ref 70–99)
Potassium: 4.2 mmol/L (ref 3.5–5.1)
Sodium: 137 mmol/L (ref 135–145)
Total Bilirubin: 0.6 mg/dL (ref 0.3–1.2)
Total Protein: 6.4 g/dL — ABNORMAL LOW (ref 6.5–8.1)

## 2020-01-08 LAB — CBC
HCT: 39.5 % (ref 36.0–46.0)
Hemoglobin: 11.7 g/dL — ABNORMAL LOW (ref 12.0–15.0)
MCH: 29 pg (ref 26.0–34.0)
MCHC: 29.6 g/dL — ABNORMAL LOW (ref 30.0–36.0)
MCV: 98 fL (ref 80.0–100.0)
Platelets: 269 10*3/uL (ref 150–400)
RBC: 4.03 MIL/uL (ref 3.87–5.11)
RDW: 13.9 % (ref 11.5–15.5)
WBC: 7.3 10*3/uL (ref 4.0–10.5)
nRBC: 0 % (ref 0.0–0.2)

## 2020-01-08 LAB — LIPASE, BLOOD: Lipase: 31 U/L (ref 11–51)

## 2020-01-08 MED ORDER — ONDANSETRON HCL 4 MG/2ML IJ SOLN
4.0000 mg | Freq: Once | INTRAMUSCULAR | Status: AC
  Administered 2020-01-08: 4 mg via INTRAVENOUS
  Filled 2020-01-08: qty 2

## 2020-01-08 MED ORDER — SODIUM CHLORIDE 0.9% FLUSH
3.0000 mL | Freq: Once | INTRAVENOUS | Status: AC
  Administered 2020-01-08: 3 mL via INTRAVENOUS

## 2020-01-08 MED ORDER — SODIUM CHLORIDE 0.9 % IV BOLUS
1000.0000 mL | Freq: Once | INTRAVENOUS | Status: AC
  Administered 2020-01-08: 1000 mL via INTRAVENOUS

## 2020-01-08 MED ORDER — MECLIZINE HCL 25 MG PO TABS
50.0000 mg | ORAL_TABLET | Freq: Once | ORAL | Status: AC
  Administered 2020-01-08: 50 mg via ORAL
  Filled 2020-01-08: qty 2

## 2020-01-08 NOTE — ED Triage Notes (Signed)
Pt here with c/o right upper quadrant pain along with nausea  And diarrhea, pt had a kidney transplant 13 years ago

## 2020-01-08 NOTE — ED Provider Notes (Signed)
[facility] EMERGENCY DEPARTMENT Provider Note   CSN: [id number] Arrival date & time: 01/08/20  1643     History Chief Complaint  Patient presents with  . Dizziness    Ariel [name] is a 44 y.o. female.  Patient with history of renal transplant on immunosuppressive therapy, baseline creatinine in the twos per patient --presents the emergency department with complaint of chronic diarrhea with recently worsening nausea and right upper quadrant abdominal pain over the past 2 weeks.  No chest pain or shortness of breath.  No fevers.  Reports normal urinary output without hematuria or dysuria.  Patient has been taking Zofran without much improvement.  She denies any persistent vomiting.  No known sick contacts.  She also has significant dizziness described as a vertigo that is worse with sitting up and movement of her head.  This is waxing and waning. Patient denies signs of stroke including: facial droop, slurred speech, aphasia, weakness/numbness in extremities.         Past Medical History:  Diagnosis Date  . Anemia   . Cancer ([place])    pre cervical  . 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 History of renal transplant   . Hypercholesterolemia   . 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 Renal failure, chronic    Dialysis in the past  . Vulvar dystrophy     Patient Active Problem List   Diagnosis Date Noted  . Dehydration 01/19/2019  . Nausea and vomiting 01/19/2019  . Anemia 01/19/2019  . Drug-induced bleeding disorder ([place]) 01/19/2019  . Dyspnea on exertion 01/19/2019  . HTN (hypertension) 01/19/2019  . Hypothyroid 01/19/2019  . Murmur 01/19/2019  . Unstable angina ([place]) 01/19/2019  . Anxiety 10/29/2018  . 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 History of parathyroidectomy 10/29/2018  . Immunosuppressive management encounter following kidney transplant  10/29/2018  . Other chronic sinusitis 10/29/2018  . Right lower quadrant abdominal pain 10/29/2018  . 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 Transplanted organ removal status 10/29/2018  . Yeast dermatitis 08/14/2018  . Plantar fasciitis 07/03/2018  . Sesamoiditis 07/03/2018  . 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 Pruritus ani 06/26/2018  . Internal hemorrhoid 04/22/2018  . Perianal lesion 04/22/2018  . Abdominal distension (gaseous) 04/16/2018  . Irregular uterine bleeding 02/13/2016  . Dysmenorrhea 10/19/2015  . Iron deficiency anemia 10/19/2015  . Mechanical complication of other vascular device, implant, and graft 09/02/2012  . End stage renal disease ([place]) 09/02/2012  . Complications due to cardiac device, implant, and graft 09/02/2012  . DVT of axillary vein, acute left ([place]) 08/02/2012  . S/p cadaver renal transplant 08/02/2012  . Hyperlipidemia 08/02/2012  . Phlebitis and thrombophlebitis of other sites 08/02/2012    Past Surgical History:  Procedure Laterality Date  . AV FISTULA PLACEMENT    . INSERTION OF DIALYSIS CATHETER    . [place], 2008  . PARATHYROIDECTOMY  2004   Portion on the parathyroid gland transplanted in R forearm  . REMOVAL OF A DIALYSIS CATHETER    . SIMPLE VULVECTOMY  06/19/2011   Partial simple left  . 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 TUBAL LIGATION       OB History   No obstetric history on file.     Family History  Problem Relation Age of Onset  . Hypertension Mother   . Hyperlipidemia Mother   . Rectal cancer Maternal Grandmother     Social History   Tobacco Use  . Smoking status: Never Smoker  . Smokeless tobacco: Never Used  Substance Use Topics  . Alcohol use: No  . Drug use: No    Home Medications Prior to Admission medications   Medication Sig Start Date End Date Taking? Authorizing Provider  acetaminophen (TYLENOL) 500 MG tablet  Take 500 mg by mouth every 6 (six) hours as needed for headache (pain).   Yes [provider]  ALPRAZolam Duanne Moron) 0.5 MG tablet Take 0.5 mg by mouth at bedtime.  05/31/15  Yes [provider]  carvedilol (COREG) 12.5 MG tablet Take 12.5 mg by mouth 2 (two) times daily. 04/09/17  Yes [provider]  chlorhexidine (PERIDEX) 0.12 % solution 15 mLs by Mouth Rinse route 2 (two) times daily. 12/05/19  Yes [provider]  dexamethasone (DECADRON) 0.1 % ophthalmic solution 4 drops See admin instructions. Instill 4 drops into each ear daily at bedtime until clear 12/21/19  Yes [provider]  famotidine (PEPCID) 40 MG tablet Take 40 mg by mouth 2 (two) times daily. 11/09/19  Yes [provider]  furosemide (LASIX) 40 MG tablet Take 40 mg by mouth daily as needed for fluid or edema.    Yes [provider]  hydrOXYzine (ATARAX/VISTARIL) 25 MG tablet Take 25-50 mg by mouth 2 (two) times daily as needed (panic attacks).  10/12/19  Yes [provider]  medroxyPROGESTERone (PROVERA) 10 MG tablet Take 10 mg by mouth daily. 04/09/17  Yes [provider]  mycophenolate (MYFORTIC) 360 MG TBEC EC tablet Take 720 mg by mouth 2 (two) times daily.  02/24/19  Yes [provider]  ondansetron (ZOFRAN-ODT) 4 MG disintegrating tablet Take 4 mg by mouth every 8 (eight) hours as needed for nausea or vomiting.  03/09/19  Yes [provider]  Potassium Chloride ER 20 MEQ TBCR Take 20 mEq by mouth daily. 09/07/17  Yes [provider]  predniSONE (DELTASONE) 5 MG tablet Take 5 mg by mouth daily.     Yes [provider]  sertraline (ZOLOFT) 100 MG tablet Take 50 mg by mouth daily.  02/21/17  Yes [provider]  simvastatin (ZOCOR) 5 MG tablet Take 5 mg by mouth at bedtime. 12/07/19  Yes [provider]  sodium bicarbonate 650 MG tablet Take 1,300 mg by mouth 2 (two) times daily. 11/03/19  Yes [provider]   tacrolimus (PROGRAF) 1 MG capsule Take 1 mg by mouth 2 (two) times daily. 03/20/17  Yes [provider]  traZODone (DESYREL) 50 MG tablet Take 50 mg by mouth at bedtime.  02/18/19  Yes [provider]    Allergies    Bactrim [sulfamethoxazole-trimethoprim], Sulfamethoxazole, Hyoscyamine, Keflex [cephalexin], Adhesive [tape], Imuran [azathioprine sodium], Tramadol, and Warfarin sodium  Review of Systems   Review of Systems  Constitutional: Negative for fever.  HENT: Negative for rhinorrhea and sore throat.   Eyes: Negative for redness.  Respiratory: Negative for cough and shortness of breath.   Cardiovascular: Negative for chest pain.  Gastrointestinal: Positive for abdominal pain, diarrhea and nausea. Negative for vomiting.  Genitourinary: Negative for dysuria.  Musculoskeletal: Negative for myalgias.  Skin: Negative for rash.  Neurological: Positive for dizziness. Negative for headaches.    Physical Exam Updated Vital Signs BP 111/79 (BP Location: Right Arm)   Pulse 81  Temp 98.3 F (36.8 C) (Oral)   Resp 18   Ht 4\' 11"  (1.499 m)   Wt 68 kg   SpO2 100%   BMI 30.30 kg/m   Physical Exam Vitals and nursing note reviewed.  Constitutional:      Appearance: She is well-developed.  HENT:     Head: Normocephalic and atraumatic.     Right Ear: Tympanic membrane, ear canal and external ear normal.     Left Ear: Tympanic membrane, ear canal and external ear normal.     Nose: Nose normal.     Mouth/Throat:     Pharynx: Uvula midline.  Eyes:     General: Lids are normal.        Right eye: No discharge.        Left eye: No discharge.     Extraocular Movements:     Right eye: No nystagmus.     Left eye: No nystagmus.     Conjunctiva/sclera: Conjunctivae normal.     Pupils: Pupils are equal, round, and reactive to light.  Cardiovascular:     Rate and Rhythm: Normal rate and regular rhythm.     Heart sounds: Normal heart sounds.  Pulmonary:     Effort:  Pulmonary effort is normal.     Breath sounds: Normal breath sounds.  Abdominal:     Palpations: Abdomen is soft.     Tenderness: There is abdominal tenderness. There is no guarding or rebound.     Comments: Mild epigastric and RUQ tenderness.   Musculoskeletal:     Cervical back: Normal range of motion and neck supple. No tenderness or bony tenderness.  Skin:    General: Skin is warm and dry.  Neurological:     Mental Status: She is alert and oriented to person, place, and time.     GCS: GCS eye subscore is 4. GCS verbal subscore is 5. GCS motor subscore is 6.     Cranial Nerves: No cranial nerve deficit.     Sensory: No sensory deficit.     Coordination: Coordination normal.     Comments: Horizontal nystagmus noted with fast phase to the left.      ED Results / Procedures / Treatments   Labs (all labs ordered are listed, but only abnormal results are displayed) Labs Reviewed  COMPREHENSIVE METABOLIC PANEL - Abnormal; Notable for the following components:      Result Value   CO2 17 (*)    Creatinine, Ser 2.59 (*)    Calcium 8.7 (*)    Total Protein 6.4 (*)    AST 11 (*)    GFR calc non Af Amer 22 (*)    GFR calc Af Amer 25 (*)    All other components within normal limits  CBC - Abnormal; Notable for the following components:   Hemoglobin 11.7 (*)    MCHC 29.6 (*)    All other components within normal limits  LIPASE, BLOOD  URINALYSIS, ROUTINE W REFLEX MICROSCOPIC    EKG None  Radiology US Abdomen Limited RUQ  Result Date: 01/08/2020 CLINICAL DATA:  Right upper quadrant pain EXAM: ULTRASOUND ABDOMEN LIMITED RIGHT UPPER QUADRANT COMPARISON:  CT 11/24/2019 FINDINGS: Gallbladder: Contracted gallbladder. No shadowing stone. Borderline wall thickness likely due to contraction. Negative sonographic Murphy. Common bile duct: Diameter: Up to 2.7 mm. Liver: Liver echogenicity is within normal limits. Possible subtle contour nodularity of the liver. Portal vein is patent on color  Doppler imaging with normal direction of blood flow towards  the liver. Other: Small amount of free fluid adjacent to the liver. Echogenic atrophic right kidney. IMPRESSION: 1. Contracted gallbladder without shadowing stone or biliary dilatation 2. Question subtle contour nodularity of the liver as could be seen with early changes of cirrhosis, correlate with LFTs. Small free fluid in the right upper quadrant adjacent to the liver 3. Atrophic echogenic native right kidney Electronically Signed   By: Donavan Foil M.D.   On: 01/08/2020 22:05    Procedures Procedures (including critical care time)  Medications Ordered in ED Medications  sodium chloride flush (NS) 0.9 % injection 3 mL (has no administration in time range)  sodium chloride 0.9 % bolus 1,000 mL (1,000 mLs Intravenous New Bag/Given 01/08/20 2341)  ondansetron (ZOFRAN) injection 4 mg (4 mg Intravenous Given 01/08/20 2338)  meclizine (ANTIVERT) tablet 50 mg (50 mg Oral Given 01/08/20 2338)    ED Course  I have reviewed the triage vital signs and the nursing notes.  Pertinent labs & imaging results that were available during my care of the patient were reviewed by me and considered in my medical decision making (see chart for details).  Patient seen and examined.  Reviewed previous CT from a few months ago.  Will obtain right upper quadrant ultrasound to evaluate for gallbladder disease.  Will give fluid bolus, antiemetics, meclizine for dizziness.  Vital signs reviewed and are as follows: BP 117/88   Pulse 73   Temp 98.3 F (36.8 C) (Oral)   Resp 15   Ht 4\' 11"  (1.499 m)   Wt 68 kg   SpO2 100%   BMI 30.30 kg/m   Patient discussed with Dr. Sedonia Small who will see.   11:44 PM patient just now getting treatments. Dr. Sedonia Small has seen.   Signout to [employer].   Plan: reassess after IV fluids, meclizine, zofran. Ambulate patient and give PO challenge. If she does well with these, may be d/c to pursue GI f/u with return precautions. If  she is significantly ataxic, she would need consideration for MRI brain to eval for posterior CVA.     MDM Rules/Calculators/A&P                      Pending completion of treatment.    Final Clinical Impression(s) / ED Diagnoses Final diagnoses:  RUQ pain  Vertigo  Chronic diarrhea  Chronic kidney disease, unspecified CKD stage  History of renal transplant    Rx / DC Orders ED Discharge Orders    None       Carlisle Cater, PA-C 01/08/20 2347    Maudie Flakes, MD 01/14/20 [phone]

## 2020-01-08 NOTE — ED Provider Notes (Signed)
N, D, dizziness, vertiginous, RUQ pain N, D chronic, worse recently US RUQ negative  Pending IVF's, Meclizine, Zofran Will need to be reassessed MRI if ataxic but likely benign vertigo  12:40 - recheck after medications. The patient is feeling some improvement. Will allow further time for medications to become effective. Anticipate discharge home. VSS.   1:30 - the patient's symptoms have returned. Attempts to sit up or stand produce recurrent dizziness, "worse than before". No further nausea. Discussed another round of medications and consideration of MRI study. She has had a liter of IVF's, 50 mg Meclizine and 4 mg zofran. Will try Ativan and reassess.   Brief neuro exam:  Minimal nystagmus on left gaze No lateralizing weakness or sensory deficits.  CN's 3-12 grossly intact.   2:40 - She is still symptomatic on re-exam. Ambulation not attempted as she is unsteady even in a sitting position. She reports dizziness has been coming and going at rest as well. No further nausea. Will order MRI as per plan.  5:15 - MRI negative. She is sleeping on recheck. Will attempt to ambulate. If symptoms still uncontrolled, preventing safe ambulation, will consider admission.   5:55 - patient is able to ambulate with minimal assistance. Discussed discharge home and the patient is comfortable with this plan. Discussed return precautions.    Charlann Lange, PA-C 01/09/20 0556    Maudie Flakes, MD 01/14/20 [phone]

## 2020-01-09 ENCOUNTER — Emergency Department (HOSPITAL_COMMUNITY): Payer: Medicare Other

## 2020-01-09 DIAGNOSIS — R270 Ataxia, unspecified: Secondary | ICD-10-CM | POA: Diagnosis not present

## 2020-01-09 DIAGNOSIS — R42 Dizziness and giddiness: Secondary | ICD-10-CM | POA: Diagnosis not present

## 2020-01-09 MED ORDER — ONDANSETRON HCL 4 MG PO TABS
4.0000 mg | ORAL_TABLET | Freq: Four times a day (QID) | ORAL | 0 refills | Status: DC
Start: 2020-01-09 — End: 2021-07-19

## 2020-01-09 MED ORDER — LORAZEPAM 2 MG/ML IJ SOLN
1.0000 mg | Freq: Once | INTRAMUSCULAR | Status: AC
  Administered 2020-01-09: 02:00:00 1 mg via INTRAVENOUS
  Filled 2020-01-09: qty 1

## 2020-01-09 MED ORDER — MECLIZINE HCL 25 MG PO TABS
25.0000 mg | ORAL_TABLET | Freq: Three times a day (TID) | ORAL | 0 refills | Status: DC
Start: 2020-01-09 — End: 2020-04-27

## 2020-01-09 NOTE — Discharge Instructions (Addendum)
Take the medications as prescribed for symptoms of dizziness and nausea. Follow up with your doctor for recheck in one week if still having symptoms.   Return to the emergency department with any worsening dizziness, or uncontrolled vomiting.

## 2020-01-09 NOTE — ED Notes (Signed)
Ambulated patient in the hall , states she feels a little unsteady on her feet however is much better.

## 2020-01-09 NOTE — ED Notes (Signed)
Returned from MRI 

## 2020-01-18 ENCOUNTER — Ambulatory Visit (INDEPENDENT_AMBULATORY_CARE_PROVIDER_SITE_OTHER): Payer: Medicare Other | Admitting: Podiatry

## 2020-01-18 ENCOUNTER — Other Ambulatory Visit: Payer: Self-pay | Admitting: Podiatry

## 2020-01-18 ENCOUNTER — Encounter: Payer: Self-pay | Admitting: Podiatry

## 2020-01-18 ENCOUNTER — Ambulatory Visit (INDEPENDENT_AMBULATORY_CARE_PROVIDER_SITE_OTHER): Payer: Medicare Other

## 2020-01-18 ENCOUNTER — Other Ambulatory Visit: Payer: Self-pay

## 2020-01-18 DIAGNOSIS — R609 Edema, unspecified: Secondary | ICD-10-CM

## 2020-01-18 DIAGNOSIS — M779 Enthesopathy, unspecified: Secondary | ICD-10-CM

## 2020-01-18 DIAGNOSIS — M7752 Other enthesopathy of left foot: Secondary | ICD-10-CM | POA: Diagnosis not present

## 2020-01-18 DIAGNOSIS — M25572 Pain in left ankle and joints of left foot: Secondary | ICD-10-CM

## 2020-01-18 NOTE — Progress Notes (Signed)
  Subjective:  Patient ID: Ariel [name], female    DOB: 08/25/1976,  MRN: [id number]  Chief Complaint  Patient presents with  . Foot Problem    the left foot is hurting on top and the ball and i have the boot on and a wrap   44 y.o. female presents with the above complaint. History confirmed with patient.   Objective:  Physical Exam: warm, good capillary refill, no trophic changes or ulcerative lesions, normal DP and PT pulses and normal sensory exam. Left Foot: POP left 2nd/3rd MPJ and interspaces with local erythema and edema No images are attached to the encounter.  Radiographs: X-ray of the left foot: no fracture, dislocation, swelling or degenerative changes noted. Vascular calcification noted. Assessment:   1. Capsulitis of metatarsophalangeal (MTP) joint of left foot   2. Pain in joint of left foot     Plan:  Patient was evaluated and treated and all questions answered.  Inflammatory Capsulitis -Educated on etiology -XR reviewed with patient  -Recc ice daily. -NWB in CAM boot.  No follow-ups on file.

## 2020-01-20 DIAGNOSIS — I10 Essential (primary) hypertension: Secondary | ICD-10-CM | POA: Diagnosis not present

## 2020-01-20 DIAGNOSIS — Z6831 Body mass index (BMI) 31.0-31.9, adult: Secondary | ICD-10-CM | POA: Diagnosis not present

## 2020-01-20 DIAGNOSIS — M778 Other enthesopathies, not elsewhere classified: Secondary | ICD-10-CM | POA: Diagnosis not present

## 2020-01-22 ENCOUNTER — Other Ambulatory Visit: Payer: Self-pay | Admitting: Sports Medicine

## 2020-01-22 ENCOUNTER — Telehealth: Payer: Self-pay

## 2020-01-22 DIAGNOSIS — M79673 Pain in unspecified foot: Secondary | ICD-10-CM | POA: Diagnosis not present

## 2020-01-22 MED ORDER — OXYCODONE-ACETAMINOPHEN 10-325 MG PO TABS: 1.0000 | ORAL_TABLET | Freq: Three times a day (TID) | ORAL | 0 refills | Status: AC | PRN

## 2020-01-22 NOTE — Telephone Encounter (Signed)
Pt has LVM twice and mother called as well stating pain has increased, pt has not been able to walk. Pt states she has elevated her foot, ice and wrap with no help and pain is radiating up her leg. Pt would like to know recommendations and if she can get anything Rx

## 2020-01-22 NOTE — Progress Notes (Signed)
Sent Percocet for pain

## 2020-01-24 DIAGNOSIS — I129 Hypertensive chronic kidney disease with stage 1 through stage 4 chronic kidney disease, or unspecified chronic kidney disease: Secondary | ICD-10-CM | POA: Diagnosis not present

## 2020-01-24 DIAGNOSIS — R52 Pain, unspecified: Secondary | ICD-10-CM | POA: Diagnosis not present

## 2020-01-24 DIAGNOSIS — M25571 Pain in right ankle and joints of right foot: Secondary | ICD-10-CM | POA: Diagnosis not present

## 2020-01-24 DIAGNOSIS — L03116 Cellulitis of left lower limb: Secondary | ICD-10-CM | POA: Diagnosis not present

## 2020-01-24 DIAGNOSIS — Z940 Kidney transplant status: Secondary | ICD-10-CM | POA: Diagnosis not present

## 2020-01-24 DIAGNOSIS — N189 Chronic kidney disease, unspecified: Secondary | ICD-10-CM | POA: Diagnosis not present

## 2020-01-24 DIAGNOSIS — R609 Edema, unspecified: Secondary | ICD-10-CM | POA: Diagnosis not present

## 2020-01-25 NOTE — Telephone Encounter (Signed)
Rx was sent Friday.

## 2020-01-25 NOTE — Telephone Encounter (Signed)
Called Pt and LVM statingthat Dr. March Rummage had sent a RF on her medication Friday afternoon.

## 2020-02-02 ENCOUNTER — Telehealth: Payer: Self-pay | Admitting: Podiatry

## 2020-02-02 NOTE — Telephone Encounter (Signed)
Pt called stating she has been elevating, icing, and taking pain meds for left foot swelling.  Pt states now has bilateral foot swelling.  Left is the whole foot & right has swelling in big toe up to ankle & redness.  No red streaks. Was warm to the touvh until she soaked feet.  Patient would like a call

## 2020-02-02 NOTE — Telephone Encounter (Signed)
Dr. March Rummage called pt back but was forwarded to voice mail. Please advice on pt's situation. Thanks

## 2020-02-04 DIAGNOSIS — N184 Chronic kidney disease, stage 4 (severe): Secondary | ICD-10-CM | POA: Diagnosis not present

## 2020-02-04 DIAGNOSIS — Z940 Kidney transplant status: Secondary | ICD-10-CM | POA: Diagnosis not present

## 2020-02-05 NOTE — Telephone Encounter (Signed)
Attempted to call patient no answer, VM left

## 2020-02-08 ENCOUNTER — Other Ambulatory Visit: Payer: Self-pay

## 2020-02-08 ENCOUNTER — Other Ambulatory Visit: Payer: Self-pay | Admitting: Podiatry

## 2020-02-08 ENCOUNTER — Ambulatory Visit (INDEPENDENT_AMBULATORY_CARE_PROVIDER_SITE_OTHER): Payer: Medicare Other | Admitting: Podiatry

## 2020-02-08 DIAGNOSIS — M25572 Pain in left ankle and joints of left foot: Secondary | ICD-10-CM

## 2020-02-08 DIAGNOSIS — M7752 Other enthesopathy of left foot: Secondary | ICD-10-CM | POA: Diagnosis not present

## 2020-02-08 DIAGNOSIS — M25571 Pain in right ankle and joints of right foot: Secondary | ICD-10-CM

## 2020-02-08 DIAGNOSIS — M109 Gout, unspecified: Secondary | ICD-10-CM | POA: Diagnosis not present

## 2020-02-08 NOTE — Progress Notes (Signed)
  Subjective:  Patient ID: Ariel [name], female    DOB: Aug 22, 1976,  MRN: [id number]  Chief Complaint  Patient presents with  . capsulitis    F/U Lt capsulitis Pt. states," sunday after I saw the Dr. I could not walk, after getting the oxycodone it helped with the pain." -pt staes she went to [facility] on 5/23 and was dx with cellulitis on Lt -less redness and swellgin -pt states her Lt is feeling much better and was Rx: clindamycin and colchicine  . Foot Pain    Pt c.o pain at Rt 1st MPJ 1 wk ago; 5/10 shapr pains -was reda nd swollen -cant't bend toe   44 y.o. female presents with the above complaint. History confirmed with patient.   Objective:  Physical Exam: warm, good capillary refill, no trophic changes or ulcerative lesions, normal DP and PT pulses and normal sensory exam. Left Foot: No POP left foot Right foot: POP R 1st MPJ Assessment:   1. Acute gout involving toe of right foot, unspecified cause   2. Capsulitis of metatarsophalangeal (MTP) joint of left foot   3. Arthralgia of right foot   4. Pain in joint of left foot     Plan:  Patient was evaluated and treated and all questions answered.  ?Gout Flare -Reviewed d/c paperwork -redness resolved with colchicine and clindamycin -Discussed that it is much more likely gout than cellulitis -Recommended against colchicine given kidney transplant. -Right 1st MPJ aspiration and injection following anesthesia with 6 ccs 50/50 1% lidocaine plain/0.5% marcaine plain. Analyze for crystals and for culture  No follow-ups on file.

## 2020-02-10 ENCOUNTER — Telehealth: Payer: Self-pay | Admitting: Gastroenterology

## 2020-02-12 LAB — SYNOVIAL FLUID, CELL COUNT

## 2020-02-12 LAB — SPECIMEN STATUS REPORT

## 2020-02-12 NOTE — Telephone Encounter (Signed)
Please go ahead and schedule her with me/APP clinic. Please get records before hand  RG

## 2020-02-23 ENCOUNTER — Encounter: Payer: Self-pay | Admitting: Gastroenterology

## 2020-02-23 NOTE — Telephone Encounter (Signed)
Called patient to schedule left voicemail. 

## 2020-02-24 NOTE — Telephone Encounter (Signed)
Appointment scheduled 04-27-2020.

## 2020-02-25 LAB — BODY FLUID CULTURE AER/ANA/GS: Organism ID, Bacteria: NONE SEEN

## 2020-02-25 LAB — RESULT

## 2020-02-25 LAB — FUNGUS STAIN

## 2020-02-25 LAB — SPECIMEN STATUS REPORT

## 2020-02-28 ENCOUNTER — Emergency Department (HOSPITAL_COMMUNITY)
Admission: EM | Admit: 2020-02-28 | Discharge: 2020-02-28 | Disposition: A | Discharge status: Home / Self Care | Payer: Medicare Other | Attending: Emergency Medicine | Admitting: Emergency Medicine

## 2020-02-28 ENCOUNTER — Other Ambulatory Visit: Payer: Self-pay

## 2020-02-28 ENCOUNTER — Encounter (HOSPITAL_COMMUNITY): Payer: Self-pay | Admitting: Emergency Medicine

## 2020-02-28 DIAGNOSIS — I120 Hypertensive chronic kidney disease with stage 5 chronic kidney disease or end stage renal disease: Secondary | ICD-10-CM | POA: Insufficient documentation

## 2020-02-28 DIAGNOSIS — Z79899 Other long term (current) drug therapy: Secondary | ICD-10-CM | POA: Diagnosis not present

## 2020-02-28 DIAGNOSIS — E039 Hypothyroidism, unspecified: Secondary | ICD-10-CM | POA: Insufficient documentation

## 2020-02-28 DIAGNOSIS — Z992 Dependence on renal dialysis: Secondary | ICD-10-CM | POA: Insufficient documentation

## 2020-02-28 DIAGNOSIS — M79673 Pain in unspecified foot: Secondary | ICD-10-CM | POA: Diagnosis present

## 2020-02-28 DIAGNOSIS — M109 Gout, unspecified: Secondary | ICD-10-CM | POA: Diagnosis not present

## 2020-02-28 DIAGNOSIS — N186 End stage renal disease: Secondary | ICD-10-CM | POA: Diagnosis not present

## 2020-02-28 MED ORDER — OXYCODONE-ACETAMINOPHEN 5-325 MG PO TABS: 1.0000 | ORAL_TABLET | Freq: Four times a day (QID) | ORAL | 0 refills | Status: DC | PRN

## 2020-02-28 MED ORDER — PREDNISONE 20 MG PO TABS
40.0000 mg | ORAL_TABLET | Freq: Every day | ORAL | 0 refills | Status: AC
Start: 2020-02-28 — End: 2020-03-04

## 2020-02-28 MED ORDER — PREDNISONE 20 MG PO TABS
40.0000 mg | ORAL_TABLET | Freq: Once | ORAL | Status: AC
  Administered 2020-02-28: 40 mg via ORAL
  Filled 2020-02-28: qty 2

## 2020-02-28 NOTE — ED Provider Notes (Signed)
[facility] EMERGENCY DEPARTMENT Provider Note   CSN: [id number] Arrival date & time: 02/28/20  1325     History Chief Complaint  Patient presents with  . Foot Pain    Ariel [name] is a 44 y.o. female.  Ariel [name] is a 44 y.o. female with history of renal transplant, hyperlipidemia, nephrogenic diabetes insipidus, cancer, who presents to the ED for evaluation of foot pain.  Patient states that for the past several months she has been having intermittent episodes where she will have areas of her feet that become red, swollen and very painful.  Most recent episode started last night in the middle of the night.  On the right foot she reports pain and swelling starting at the big toe, and on the left foot she reports pain and swelling starting at the ankle.  She had a very similar presentation at the beginning of the month, was seen in the ED at [facility] and treated for potential gout or cellulitis with colchicine, but when she saw her podiatrist a few days later he cautioned her to not take colchicine due to her previous renal transplant, he did send off a joint aspiration from the right first MTP joint, patient states she has an appointment coming up in 2 days to receive the results of those tests and discuss further treatment.  She states that after the colchicine on antibiotics patient symptoms did not completely resolve until he restarted today.  Her podiatrist thought that symptoms were much more likely due to gout rather than cellulitis.  She reports today's presentation feels very similar but she has had no associated fevers, redness and swelling has not traveled applied.  She reports pain is constant and severe.  With previous pain she has been treated with oxycodone with significant improvement, she is not a candidate for NSAIDs.  No other aggravating or alleviating factors.        Past Medical History:  Diagnosis Date  . Anemia   .  Cancer ([place])    pre cervical  . 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 History of renal transplant   . Hypercholesterolemia   . 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 Renal failure, chronic    Dialysis in the past  . Vulvar dystrophy     Patient Active Problem List   Diagnosis Date Noted  . Dehydration 01/19/2019  . Nausea and vomiting 01/19/2019  . Anemia 01/19/2019  . Drug-induced bleeding disorder ([place]) 01/19/2019  . Dyspnea on exertion 01/19/2019  . HTN (hypertension) 01/19/2019  . Hypothyroid 01/19/2019  . Murmur 01/19/2019  . Unstable angina ([place]) 01/19/2019  . Anxiety 10/29/2018  . 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 History of parathyroidectomy 10/29/2018  . Immunosuppressive management encounter following kidney transplant 10/29/2018  . Other chronic sinusitis 10/29/2018  . Right lower quadrant abdominal pain 10/29/2018  . 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 Transplanted organ removal status 10/29/2018  . Yeast dermatitis 08/14/2018  . Plantar fasciitis 07/03/2018  . Sesamoiditis 07/03/2018  . 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 Pruritus ani 06/26/2018  . Internal hemorrhoid 04/22/2018  . Perianal lesion 04/22/2018  . Abdominal distension (gaseous) 04/16/2018  . Irregular uterine bleeding 02/13/2016  . Dysmenorrhea 10/19/2015  . Iron deficiency anemia 10/19/2015  . Mechanical complication of other vascular device, implant, and graft 09/02/2012  . End  stage renal disease ([place]) 09/02/2012  . Complications due to cardiac device, implant, and graft 09/02/2012  . DVT of axillary vein, acute left ([place]) 08/02/2012  . S/p cadaver renal transplant 08/02/2012  . Hyperlipidemia 08/02/2012  . Phlebitis and thrombophlebitis of other sites 08/02/2012    Past Surgical History:  Procedure Laterality Date  . AV FISTULA PLACEMENT    . INSERTION OF DIALYSIS CATHETER    .  [place], 2008  . PARATHYROIDECTOMY  2004   Portion on the parathyroid gland transplanted in R forearm  . REMOVAL OF A DIALYSIS CATHETER    . SIMPLE VULVECTOMY  06/19/2011   Partial simple left  . 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 TUBAL LIGATION       OB History   No obstetric history on file.     Family History  Problem Relation Age of Onset  . Hypertension Mother   . Hyperlipidemia Mother   . Rectal cancer Maternal Grandmother     Social History   Tobacco Use  . Smoking status: Never Smoker  . Smokeless tobacco: Never Used  Substance Use Topics  . Alcohol use: No  . Drug use: No    Home Medications Prior to Admission medications   Medication Sig Start Date End Date Taking? Authorizing Provider  acetaminophen (TYLENOL) 500 MG tablet Take 500 mg by mouth every 6 (six) hours as needed for headache (pain).    [provider]  ALPRAZolam Duanne Moron) 0.5 MG tablet Take 0.5 mg by mouth at bedtime.  05/31/15   [provider]  carvedilol (COREG) 12.5 MG tablet Take 12.5 mg by mouth 2 (two) times daily. 04/09/17   [provider]  chlorhexidine (PERIDEX) 0.12 % solution 15 mLs by Mouth Rinse route 2 (two) times daily. 12/05/19   [provider]  dexamethasone (DECADRON) 0.1 % ophthalmic solution 4 drops See admin instructions. Instill 4 drops into each ear daily at bedtime until clear 12/21/19   [provider]  famotidine (PEPCID) 40 MG tablet Take 40 mg by mouth 2 (two) times daily. 11/09/19   [provider]  furosemide (LASIX) 40 MG tablet Take 40 mg by mouth daily as needed for fluid or edema.     [provider]  hydrOXYzine (ATARAX/VISTARIL) 25 MG tablet Take 25-50 mg by mouth 2 (two) times daily as needed (panic attacks).  10/12/19   [provider]  meclizine (ANTIVERT) 25 MG tablet Take 1 tablet (25 mg total) by mouth 3 (three) times daily.  01/09/20   Charlann Lange, PA-C  medroxyPROGESTERone (PROVERA) 10 MG tablet Take 10 mg by mouth daily. 04/09/17   [provider]  mycophenolate (MYFORTIC) 360 MG TBEC EC tablet Take 720 mg by mouth 2 (two) times daily.  02/24/19   [provider]  ondansetron (ZOFRAN) 4 MG tablet Take 1 tablet (4 mg total) by mouth every 6 (six) hours. 01/09/20   Charlann Lange, PA-C  ondansetron (ZOFRAN-ODT) 4 MG disintegrating tablet Take 4 mg by mouth every 8 (eight) hours as needed for nausea or vomiting.  03/09/19   [provider]  oxyCODONE-acetaminophen (PERCOCET/ROXICET) 5-325 MG tablet Take 1-2 tablets by mouth every 6 (six) hours as needed for severe pain. 02/28/20   Jacqlyn Larsen, PA-C  Potassium Chloride ER 20 MEQ TBCR Take 20 mEq by mouth daily. 09/07/17   [provider]  predniSONE (DELTASONE) 20 MG tablet Take  2 tablets (40 mg total) by mouth daily for 5 days. 02/28/20 03/04/20  Jacqlyn Larsen, PA-C  predniSONE (DELTASONE) 5 MG tablet Take 5 mg by mouth daily.      [provider]  sertraline (ZOLOFT) 100 MG tablet Take 50 mg by mouth daily.  02/21/17   [provider]  simvastatin (ZOCOR) 5 MG tablet Take 5 mg by mouth at bedtime. 12/07/19   [provider]  sodium bicarbonate 650 MG tablet Take 1,300 mg by mouth 2 (two) times daily. 11/03/19   [provider]  tacrolimus (PROGRAF) 1 MG capsule Take 1 mg by mouth 2 (two) times daily. 03/20/17   [provider]  traZODone (DESYREL) 50 MG tablet Take 50 mg by mouth at bedtime.  02/18/19   [provider]  Colchicine 0.6 MG CAPS Take 1 capsule by mouth daily. 01/24/20 02/28/20  [provider]    Allergies    Bactrim [sulfamethoxazole-trimethoprim], Sulfamethoxazole, Hyoscyamine, Keflex [cephalexin], Adhesive [tape], Imuran [azathioprine sodium], Tramadol, and Warfarin sodium  Review of Systems   Review of Systems  Constitutional: Negative for chills and fever.   Musculoskeletal: Positive for arthralgias and joint swelling.  Skin: Negative for color change and rash.  Neurological: Negative for weakness and numbness.    Physical Exam Updated Vital Signs BP 127/87 (BP Location: Right Arm)   Pulse 85   Temp 98.4 F (36.9 C) (Oral)   Resp 14   Ht 4\' 11"  (1.499 m)   Wt 65.3 kg   SpO2 94%   BMI 29.08 kg/m   Physical Exam Vitals and nursing note reviewed.  Constitutional:      General: She is not in acute distress.    Appearance: Normal appearance. She is well-developed. She is not ill-appearing or diaphoretic.  HENT:     Head: Normocephalic and atraumatic.  Eyes:     General:        Right eye: No discharge.        Left eye: No discharge.  Pulmonary:     Effort: Pulmonary effort is normal. No respiratory distress.  Musculoskeletal:     Comments: Right foot with redness swelling and pain at the first MTP joint, there is also some mild swelling and pain at the left foot over the ankle, 2+ DP and TP pulses bilaterally, pain made worse with range of motion over the areas of pain.  Slightly warm to the touch.  Skin:    General: Skin is warm and dry.     Findings: No rash.  Neurological:     Mental Status: She is alert and oriented to person, place, and time.     Coordination: Coordination normal.  Psychiatric:        Behavior: Behavior normal.     ED Results / Procedures / Treatments   Labs (all labs ordered are listed, but only abnormal results are displayed) Labs Reviewed - No data to display  EKG None  Radiology No results found.  Procedures Procedures (including critical care time)  Medications Ordered in ED Medications  predniSONE (DELTASONE) tablet 40 mg (40 mg Oral Given 02/28/20 1631)    ED Course  I have reviewed the triage vital signs and the nursing notes.  Pertinent labs & imaging results that were available during my care of the patient were reviewed by me and considered in my medical decision making (see  chart for details).    MDM Rules/Calculators/A&P  44 year old female with focal pain, swelling and redness to the right first MTP joint and left ankle, history of suspected gout, and this presentation seems identical to the most recent symptoms at the beginning of the month.  Reviewed joint aspirate lab results from patient's podiatrist, no white cells or evidence of bacteria noted, I am not able to see results from crystal analysis, but I have high suspicion that states symptoms are also due to gout.  Given patient's renal transplant she is not a candidate for NSAIDs or colchicine.  She is currently on chronic daily low-dose steroid, but will prescribe short burst of prednisone and also prescribed pain medication for the patient and she will see her podiatrist in 2 days for follow-up.  Patient expresses understanding and agreement with this plan.  Discharged home in good condition.  Final Clinical Impression(s) / ED Diagnoses Final diagnoses:  Acute gout of multiple sites, unspecified cause    Rx / DC Orders ED Discharge Orders         Ordered    predniSONE (DELTASONE) 20 MG tablet  Daily     Discontinue  Reprint     02/28/20 1607    oxyCODONE-acetaminophen (PERCOCET/ROXICET) 5-325 MG tablet  Every 6 hours PRN     Discontinue  Reprint     02/28/20 1611           Jacqlyn Larsen, PA-C 03/01/20 0252    Tegeler, Gwenyth Allegra, MD 03/03/20 [phone]

## 2020-02-28 NOTE — ED Triage Notes (Signed)
Pt reports pain and redness intermittently to bilateral feet since January.  States she has been on antibiotics and has been seen by Podiatrist at [facility].  Pt had MPJ aspiration on 6/7 and has not received results yet.  States she is unable to control pain at home.

## 2020-02-28 NOTE — Discharge Instructions (Signed)
Please take burst of prednisone for the next few days until you see your podiatrist. You may use prescribed oxycodone 1-2 tabs every 6 hours as needed to control pain. This medication can cause drowsiness, do not drive after taking medication.  Follow up with Dr. March Rummage as planned on Tuesday

## 2020-03-01 ENCOUNTER — Other Ambulatory Visit: Payer: Self-pay

## 2020-03-01 ENCOUNTER — Ambulatory Visit (INDEPENDENT_AMBULATORY_CARE_PROVIDER_SITE_OTHER): Payer: Medicare Other | Admitting: Podiatry

## 2020-03-01 ENCOUNTER — Telehealth: Payer: Self-pay | Admitting: *Deleted

## 2020-03-01 DIAGNOSIS — M109 Gout, unspecified: Secondary | ICD-10-CM | POA: Diagnosis not present

## 2020-03-01 DIAGNOSIS — M25571 Pain in right ankle and joints of right foot: Secondary | ICD-10-CM

## 2020-03-01 DIAGNOSIS — M7752 Other enthesopathy of left foot: Secondary | ICD-10-CM

## 2020-03-01 NOTE — Telephone Encounter (Signed)
Faxed 6 months of clinicals with referral and 02/08/2020 labs and demographics to [facility] - Dr. Hermelinda Medicus.

## 2020-03-01 NOTE — Progress Notes (Signed)
  Subjective:  Patient ID: Ariel [name], female    DOB: 07-25-1976,  MRN: [id number]  Chief Complaint  Patient presents with  . Gout    pt stats she was at the ER [place] for Rt 1st met redness and swelling -pt states ER Dr. Junie Spencer really tell what it was -pt was Rx prednisone and oxydoconde -pt states,' doing better, smaller." -w/ less redness and swellign -no pain today -wrose in AM    44 y.o. female presents with the above complaint. History confirmed with patient.   Objective:  Physical Exam: warm, good capillary refill, no trophic changes or ulcerative lesions, normal DP and PT pulses and normal sensory exam. Left Foot: No POP left foot Right foot: No POP R 1st MPJ Assessment:   1. Acute gout involving toe of right foot, unspecified cause   2. Capsulitis of metatarsophalangeal (MTP) joint of left foot   3. Arthralgia of right foot     Plan:  Patient was evaluated and treated and all questions answered.  Gout Flare -Recent ED admission - redness resolved with prednisone, again suggesting inflammatory component, likely gout. -Aspiration reviewed. They were unable to analyze for crystals, unfortunately. Will plan for repeat aspiration should this flare again. There was a growth on 1/2 cultures that grew very late in the processing of the specimen and is likely contaminant. Discussed findings with patient -Will refer patient to Rheumatology for further eval -Should pain persist consider repeat injecitons.  No follow-ups on file.

## 2020-03-01 NOTE — Telephone Encounter (Signed)
-----   Message from Evelina Bucy, DPM sent at 03/01/2020 12:59 PM EDT ----- Can we refer to rheumatology for eval for gout vs pseudogout? Closest near [employer]

## 2020-03-10 ENCOUNTER — Emergency Department (HOSPITAL_COMMUNITY)
Admission: EM | Admit: 2020-03-10 | Discharge: 2020-03-11 | Disposition: A | Discharge status: Home / Self Care | Payer: Medicare Other | Attending: Emergency Medicine | Admitting: Emergency Medicine

## 2020-03-10 ENCOUNTER — Encounter (HOSPITAL_COMMUNITY): Payer: Self-pay | Admitting: Emergency Medicine

## 2020-03-10 ENCOUNTER — Telehealth: Payer: Self-pay | Admitting: Podiatry

## 2020-03-10 DIAGNOSIS — R000 Tachycardia, unspecified: Secondary | ICD-10-CM | POA: Diagnosis not present

## 2020-03-10 DIAGNOSIS — I10 Essential (primary) hypertension: Secondary | ICD-10-CM | POA: Diagnosis not present

## 2020-03-10 DIAGNOSIS — M79671 Pain in right foot: Secondary | ICD-10-CM | POA: Insufficient documentation

## 2020-03-10 DIAGNOSIS — M79672 Pain in left foot: Secondary | ICD-10-CM | POA: Insufficient documentation

## 2020-03-10 DIAGNOSIS — Z79899 Other long term (current) drug therapy: Secondary | ICD-10-CM | POA: Diagnosis not present

## 2020-03-10 DIAGNOSIS — R609 Edema, unspecified: Secondary | ICD-10-CM | POA: Diagnosis not present

## 2020-03-10 DIAGNOSIS — R52 Pain, unspecified: Secondary | ICD-10-CM | POA: Diagnosis not present

## 2020-03-10 LAB — CBC WITH DIFFERENTIAL/PLATELET
Abs Immature Granulocytes: 0.09 10*3/uL — ABNORMAL HIGH (ref 0.00–0.07)
Basophils Absolute: 0 10*3/uL (ref 0.0–0.1)
Basophils Relative: 0 %
Eosinophils Absolute: 0.1 10*3/uL (ref 0.0–0.5)
Eosinophils Relative: 1 %
HCT: 38.4 % (ref 36.0–46.0)
Hemoglobin: 11.6 g/dL — ABNORMAL LOW (ref 12.0–15.0)
Immature Granulocytes: 1 %
Lymphocytes Relative: 14 %
Lymphs Abs: 1.2 10*3/uL (ref 0.7–4.0)
MCH: 27.6 pg (ref 26.0–34.0)
MCHC: 30.2 g/dL (ref 30.0–36.0)
MCV: 91.4 fL (ref 80.0–100.0)
Monocytes Absolute: 0.8 10*3/uL (ref 0.1–1.0)
Monocytes Relative: 9 %
Neutro Abs: 6.8 10*3/uL (ref 1.7–7.7)
Neutrophils Relative %: 75 %
Platelets: 141 10*3/uL — ABNORMAL LOW (ref 150–400)
RBC: 4.2 MIL/uL (ref 3.87–5.11)
RDW: 15.5 % (ref 11.5–15.5)
WBC: 9 10*3/uL (ref 4.0–10.5)
nRBC: 0 % (ref 0.0–0.2)

## 2020-03-10 LAB — COMPREHENSIVE METABOLIC PANEL
ALT: 18 U/L (ref 0–44)
AST: 15 U/L (ref 15–41)
Albumin: 2.9 g/dL — ABNORMAL LOW (ref 3.5–5.0)
Alkaline Phosphatase: 47 U/L (ref 38–126)
Anion gap: 11 (ref 5–15)
BUN: 28 mg/dL — ABNORMAL HIGH (ref 6–20)
CO2: 17 mmol/L — ABNORMAL LOW (ref 22–32)
Calcium: 7.1 mg/dL — ABNORMAL LOW (ref 8.9–10.3)
Chloride: 107 mmol/L (ref 98–111)
Creatinine, Ser: 2.8 mg/dL — ABNORMAL HIGH (ref 0.44–1.00)
GFR calc Af Amer: 23 mL/min — ABNORMAL LOW (ref >60)
GFR calc non Af Amer: 20 mL/min — ABNORMAL LOW (ref >60)
Glucose, Bld: 95 mg/dL (ref 70–99)
Potassium: 4.5 mmol/L (ref 3.5–5.1)
Sodium: 135 mmol/L (ref 135–145)
Total Bilirubin: 0.8 mg/dL (ref 0.3–1.2)
Total Protein: 5.6 g/dL — ABNORMAL LOW (ref 6.5–8.1)

## 2020-03-10 LAB — SEDIMENTATION RATE: Sed Rate: 12 mm/hr (ref 0–22)

## 2020-03-10 LAB — C-REACTIVE PROTEIN: CRP: 20.4 mg/dL — ABNORMAL HIGH (ref <1.0)

## 2020-03-10 MED ORDER — KETOROLAC TROMETHAMINE 15 MG/ML IJ SOLN
15.0000 mg | Freq: Once | INTRAMUSCULAR | Status: AC
  Administered 2020-03-10: 15 mg via INTRAMUSCULAR
  Filled 2020-03-10: qty 1

## 2020-03-10 MED ORDER — OXYCODONE-ACETAMINOPHEN 5-325 MG PO TABS
1.0000 | ORAL_TABLET | Freq: Once | ORAL | Status: AC
  Administered 2020-03-10: 1 via ORAL
  Filled 2020-03-10: qty 1

## 2020-03-10 MED ORDER — PREDNISONE 20 MG PO TABS
60.0000 mg | ORAL_TABLET | ORAL | Status: AC
  Administered 2020-03-11: 60 mg via ORAL
  Filled 2020-03-10: qty 3

## 2020-03-10 MED ORDER — PREDNISONE 20 MG PO TABS
60.0000 mg | ORAL_TABLET | Freq: Every day | ORAL | 0 refills | Status: AC
Start: 2020-03-10 — End: 2020-03-14

## 2020-03-10 MED ORDER — GABAPENTIN 100 MG PO CAPS
100.0000 mg | ORAL_CAPSULE | Freq: Three times a day (TID) | ORAL | 0 refills | Status: DC
Start: 2020-03-10 — End: 2020-04-27

## 2020-03-10 MED ORDER — HYDROMORPHONE HCL 1 MG/ML IJ SOLN
1.0000 mg | Freq: Once | INTRAMUSCULAR | Status: AC
  Administered 2020-03-10: 1 mg via INTRAMUSCULAR
  Filled 2020-03-10: qty 1

## 2020-03-10 NOTE — ED Notes (Signed)
Pt unable to transfer from wheelchair to stretcher via stand and pivot. Pulled over on sheet by 4 staff

## 2020-03-10 NOTE — ED Provider Notes (Signed)
[facility] EMERGENCY DEPARTMENT Provider Note   CSN: [id number] Arrival date & time: 03/10/20  1511     History No chief complaint on file.   Ariel [name] is a 44 y.o. female.  HPI    Patient with multiple medical issues presents with bilateral foot pain. She notes that her pain has been present for about 6 months, occurring without precipitant. Since onset pain has been in both, though it is worse on the left. Pain is on the dorsum of each foot with associated erythema.  Line pain is worse with motion, weightbearing, and over the past 2 or 3 days, patient has had difficulty with ambulation or getting out of bed secondary to this. Patient has been seen and evaluated by her physicians and by podiatry throughout this illness. She has had joint aspiration, has had multiple courses of medication including a trial of steroids about 1 month ago. She is currently using Percocet for pain relief, which has not changed her symptoms substantially. No other new complaints including fever, nausea, other pain, skin changes.  Past Medical History:  Diagnosis Date  . Anemia   . Cancer ([place])    pre cervical  . 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 History of renal transplant   . Hypercholesterolemia   . 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 Renal failure, chronic    Dialysis in the past  . Vulvar dystrophy     Patient Active Problem List   Diagnosis Date Noted  . Dehydration 01/19/2019  . Nausea and vomiting 01/19/2019  . Anemia 01/19/2019  . Drug-induced bleeding disorder ([place]) 01/19/2019  . Dyspnea on exertion 01/19/2019  . HTN (hypertension) 01/19/2019  . Hypothyroid 01/19/2019  . Murmur 01/19/2019  . Unstable angina ([place]) 01/19/2019  . Anxiety 10/29/2018  . 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 History of parathyroidectomy 10/29/2018  . Immunosuppressive  management encounter following kidney transplant 10/29/2018  . Other chronic sinusitis 10/29/2018  . Right lower quadrant abdominal pain 10/29/2018  . 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 Transplanted organ removal status 10/29/2018  . Yeast dermatitis 08/14/2018  . Plantar fasciitis 07/03/2018  . Sesamoiditis 07/03/2018  . 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 Pruritus ani 06/26/2018  . Internal hemorrhoid 04/22/2018  . Perianal lesion 04/22/2018  . Abdominal distension (gaseous) 04/16/2018  . Irregular uterine bleeding 02/13/2016  . Dysmenorrhea 10/19/2015  . Iron deficiency anemia 10/19/2015  . Mechanical complication of other vascular device, implant, and graft 09/02/2012  . End stage renal disease ([place]) 09/02/2012  . Complications due to cardiac device, implant, and graft 09/02/2012  . DVT of axillary vein, acute left ([place]) 08/02/2012  . S/p cadaver renal transplant 08/02/2012  . Hyperlipidemia 08/02/2012  . Phlebitis and thrombophlebitis of other sites 08/02/2012    Past Surgical History:  Procedure Laterality Date  . AV FISTULA PLACEMENT    . INSERTION OF DIALYSIS CATHETER    . [place], 2008  . PARATHYROIDECTOMY  2004   Portion on the parathyroid gland transplanted in R forearm  . REMOVAL OF A DIALYSIS CATHETER    . SIMPLE VULVECTOMY  06/19/2011   Partial simple left  . 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 TUBAL LIGATION       OB History  No obstetric history on file.     Family History  Problem Relation Age of Onset  . Hypertension Mother   . Hyperlipidemia Mother   . Rectal cancer Maternal Grandmother     Social History   Tobacco Use  . Smoking status: Never Smoker  . Smokeless tobacco: Never Used  Substance Use Topics  . Alcohol use: No  . Drug use: No    Home Medications Prior to Admission medications   Medication Sig Start Date End Date Taking? Authorizing  Provider  acetaminophen (TYLENOL) 500 MG tablet Take 500 mg by mouth every 6 (six) hours as needed for headache (pain).   Yes [provider]  ALPRAZolam Duanne Moron) 0.5 MG tablet Take 0.5 mg by mouth at bedtime.  05/31/15  Yes [provider]  carvedilol (COREG) 12.5 MG tablet Take 12.5 mg by mouth 2 (two) times daily. 04/09/17  Yes [provider]  chlorhexidine (PERIDEX) 0.12 % solution 15 mLs by Mouth Rinse route 2 (two) times daily. 12/05/19  Yes [provider]  famotidine (PEPCID) 40 MG tablet Take 40 mg by mouth 2 (two) times daily. 11/09/19  Yes [provider]  furosemide (LASIX) 40 MG tablet Take 40 mg by mouth daily as needed for fluid or edema.    Yes [provider]  hydrOXYzine (ATARAX/VISTARIL) 25 MG tablet Take 25-50 mg by mouth 2 (two) times daily as needed (panic attacks).  10/12/19  Yes [provider]  meclizine (ANTIVERT) 25 MG tablet Take 1 tablet (25 mg total) by mouth 3 (three) times daily. 01/09/20  Yes Upstill, Nehemiah Settle, PA-C  medroxyPROGESTERone (PROVERA) 10 MG tablet Take 10 mg by mouth daily. 04/09/17  Yes [provider]  mycophenolate (MYFORTIC) 360 MG TBEC EC tablet Take 720 mg by mouth 2 (two) times daily.  02/24/19  Yes [provider]  ondansetron (ZOFRAN) 4 MG tablet Take 1 tablet (4 mg total) by mouth every 6 (six) hours. 01/09/20  Yes Upstill, Shari, PA-C  ondansetron (ZOFRAN-ODT) 4 MG disintegrating tablet Take 4 mg by mouth every 8 (eight) hours as needed for nausea or vomiting.  03/09/19  Yes [provider]  oxyCODONE-acetaminophen (PERCOCET/ROXICET) 5-325 MG tablet Take 1-2 tablets by mouth every 6 (six) hours as needed for severe pain. 02/28/20  Yes Jacqlyn Larsen, PA-C  Potassium Chloride ER 20 MEQ TBCR Take 20 mEq by mouth daily. 09/07/17  Yes [provider]  predniSONE (DELTASONE) 5 MG tablet Take 5 mg by mouth daily.     Yes [provider]  sertraline (ZOLOFT) 100 MG  tablet Take 50 mg by mouth daily.  02/21/17  Yes [provider]  simvastatin (ZOCOR) 5 MG tablet Take 5 mg by mouth at bedtime. 12/07/19  Yes [provider]  sodium bicarbonate 650 MG tablet Take 1,300 mg by mouth 2 (two) times daily. 11/03/19  Yes [provider]  tacrolimus (PROGRAF) 1 MG capsule Take 1 mg by mouth 2 (two) times daily. 03/20/17  Yes [provider]  traZODone (DESYREL) 50 MG tablet Take 50 mg by mouth at bedtime.  02/18/19  Yes [provider]  dexamethasone (DECADRON) 0.1 % ophthalmic solution 4 drops See admin instructions. Instill 4 drops into each ear daily at bedtime until clear Patient not taking: Reported on 03/10/2020 12/21/19   [provider]  gabapentin (NEURONTIN) 100 MG capsule Take 1 capsule (100 mg total) by mouth 3 (three) times daily. 03/10/20   Carmin Muskrat, MD  predniSONE (DELTASONE) 20  MG tablet Take 3 tablets (60 mg total) by mouth daily with breakfast for 4 days. For the next four days 03/10/20 03/14/20  Carmin Muskrat, MD  Colchicine 0.6 MG CAPS Take 1 capsule by mouth daily. 01/24/20 02/28/20  [provider]    Allergies    Bactrim [sulfamethoxazole-trimethoprim], Sulfamethoxazole, Hyoscyamine, Keflex [cephalexin], Adhesive [tape], Imuran [azathioprine sodium], Tramadol, and Warfarin sodium  Review of Systems   Review of Systems  Constitutional:       Per HPI, otherwise negative  HENT:       Per HPI, otherwise negative  Respiratory:       Per HPI, otherwise negative  Cardiovascular:       Per HPI, otherwise negative  Gastrointestinal: Negative for vomiting.  Endocrine:       Negative aside from HPI  Genitourinary:       Neg aside from HPI   Musculoskeletal:       Per HPI, otherwise negative  Skin: Positive for color change.  Allergic/Immunologic: Positive for immunocompromised state.  Neurological: Negative for syncope.    Physical Exam Updated Vital Signs BP (!) 100/59   Pulse 99    Temp 99.3 F (37.4 C) (Oral)   Resp 17   Ht 4\' 11"  (1.499 m)   Wt 63 kg   SpO2 100%   BMI 28.07 kg/m   Physical Exam Vitals and nursing note reviewed.  Constitutional:      General: She is not in acute distress.    Appearance: She is well-developed.  HENT:     Head: Normocephalic and atraumatic.  Eyes:     Conjunctiva/sclera: Conjunctivae normal.  Cardiovascular:     Rate and Rhythm: Normal rate and regular rhythm.     Pulses: Normal pulses.  Pulmonary:     Effort: Pulmonary effort is normal. No respiratory distress.     Breath sounds: Normal breath sounds. No stridor.  Abdominal:     General: There is no distension.  Skin:    General: Skin is warm and dry.     Findings: Erythema present.  Neurological:     Mental Status: She is alert and oriented to person, place, and time.     Cranial Nerves: No cranial nerve deficit.      ED Results / Procedures / Treatments   Labs (all labs ordered are listed, but only abnormal results are displayed) Labs Reviewed  COMPREHENSIVE METABOLIC PANEL - Abnormal; Notable for the following components:      Result Value   CO2 17 (*)    BUN 28 (*)    Creatinine, Ser 2.80 (*)    Calcium 7.1 (*)    Total Protein 5.6 (*)    Albumin 2.9 (*)    GFR calc non Af Amer 20 (*)    GFR calc Af Amer 23 (*)    All other components within normal limits  CBC WITH DIFFERENTIAL/PLATELET - Abnormal; Notable for the following components:   Hemoglobin 11.6 (*)    Platelets 141 (*)    Abs Immature Granulocytes 0.09 (*)    All other components within normal limits  C-REACTIVE PROTEIN - Abnormal; Notable for the following components:   CRP 20.4 (*)    All other components within normal limits  SEDIMENTATION RATE     Procedures Procedures (including critical care time)  Medications Ordered in ED Medications  predniSONE (DELTASONE) tablet 60 mg (has no administration in time range)  oxyCODONE-acetaminophen (PERCOCET/ROXICET) 5-325 MG per  tablet 1 tablet (1 tablet Oral  Given 03/10/20 1528)  ketorolac (TORADOL) 15 MG/ML injection 15 mg (15 mg Intramuscular Given 03/10/20 2101)  HYDROmorphone (DILAUDID) injection 1 mg (1 mg Intramuscular Given 03/10/20 2101)    ED Course  I have reviewed the triage vital signs and the nursing notes.  Pertinent labs & imaging results that were available during my care of the patient were reviewed by me and considered in my medical decision making (see chart for details).  Patient with multiple medical problems, including relatively immunocompromised state presents with bilateral foot pain for 6 months.  On here she is awake, alert, afebrile, speaking clearly, in no distress.  Line given her presentation consideration of infection versus inflammation, vasculitis, or other etiologies considered. Recent evaluation with podiatry reviewed. Labs, analgesic ordered.  11:44 PM Patient white, alert, speaking clearly.  When she has received analgesia, notes that she feels somewhat better. Given consideration of inflammatory condition given the absence of evidence for systemic infection, or evidence for gout, though this may be an atypical episode, the patient is amenable to continued steroids, analgesia, following up with her transplant team.  MDM Number of Diagnoses or Management Options Pain in both feet: established, worsening   Amount and/or Complexity of Data Reviewed Clinical lab tests: reviewed Tests in the radiology section of CPT: reviewed Tests in the medicine section of CPT: reviewed Decide to obtain previous medical records or to obtain history from someone other than the patient: yes Obtain history from someone other than the patient: yes Review and summarize past medical records: yes  Risk of Complications, Morbidity, and/or Mortality Presenting problems: high Diagnostic procedures: high Management options: high  Critical Care Total time providing critical care: < 30  minutes  Patient Progress Patient progress: stable   Final Clinical Impression(s) / ED Diagnoses Final diagnoses:  Pain in both feet    Rx / DC Orders ED Discharge Orders         Ordered    gabapentin (NEURONTIN) 100 MG capsule  3 times daily     Discontinue  Reprint     03/10/20 2343    predniSONE (DELTASONE) 20 MG tablet  Daily with breakfast     Discontinue  Reprint     03/10/20 2343           Carmin Muskrat, MD 03/10/20 2347

## 2020-03-10 NOTE — Discharge Instructions (Signed)
As discussed complete evaluation has been generally reassuring, and there are some suspicion for your bilateral foot pain being inflammatory or drug-related reaction.  Please take all medication as prescribed and be sure to follow-up with your transplant team.  Call tomorrow for the next available appointment.  Return here for concerning changes in your condition.

## 2020-03-10 NOTE — Telephone Encounter (Signed)
Pt is requesting pain medication for bil foot pain/swelling.  States she can't get out of bed because the pain is so bad.  [place].  NKDA

## 2020-03-10 NOTE — Telephone Encounter (Signed)
Left message informing pt that if she was having so much pain and swelling in her feet she should be seen in office, either today or tomorrow in [place].

## 2020-03-10 NOTE — ED Notes (Signed)
EDP at bedside  

## 2020-03-10 NOTE — ED Triage Notes (Signed)
Pt here from home with c/o bil feet pain , both feet are red warm and painful to touch , pt thought it was gout but has not done her follow as of yet

## 2020-03-11 ENCOUNTER — Ambulatory Visit (INDEPENDENT_AMBULATORY_CARE_PROVIDER_SITE_OTHER): Payer: Medicare Other | Admitting: Podiatry

## 2020-03-11 ENCOUNTER — Other Ambulatory Visit: Payer: Self-pay

## 2020-03-11 DIAGNOSIS — M7751 Other enthesopathy of right foot: Secondary | ICD-10-CM | POA: Diagnosis not present

## 2020-03-11 DIAGNOSIS — M79671 Pain in right foot: Secondary | ICD-10-CM | POA: Diagnosis not present

## 2020-03-11 DIAGNOSIS — M10079 Idiopathic gout, unspecified ankle and foot: Secondary | ICD-10-CM | POA: Diagnosis not present

## 2020-03-11 DIAGNOSIS — M7752 Other enthesopathy of left foot: Secondary | ICD-10-CM | POA: Diagnosis not present

## 2020-03-11 DIAGNOSIS — M25571 Pain in right ankle and joints of right foot: Secondary | ICD-10-CM

## 2020-03-11 DIAGNOSIS — M25572 Pain in left ankle and joints of left foot: Secondary | ICD-10-CM | POA: Diagnosis not present

## 2020-03-11 MED ORDER — HYDROCODONE-ACETAMINOPHEN 10-325 MG PO TABS: 1.0000 | ORAL_TABLET | Freq: Four times a day (QID) | ORAL | 0 refills | Status: DC | PRN

## 2020-03-11 NOTE — ED Notes (Signed)
Pt ambulatory to [facility] via [facility], pt verbalized understanding of d/c instructions, medications and follow up. Pt reports she has family on the way to get her

## 2020-03-11 NOTE — ED Notes (Signed)
Pt arrived via EMS, unable to bear weight on feet x 2 days, pt lives alone, father does not drive at night, mother is having surgery in AM, sister unable to drive. Pt continues to call people to find a ride home to [place].

## 2020-03-13 ENCOUNTER — Encounter: Payer: Self-pay | Admitting: Podiatry

## 2020-03-13 NOTE — Progress Notes (Signed)
Subjective:  Patient ID: Ariel [name], female    DOB: Oct 29, 1975,  MRN: [id number]  Chief Complaint  Patient presents with  . Foot Pain    bilateral, severe in nature, having trouble walking - ED last night sent her here    44 y.o. female presents with the above complaint.  Patient presents with a complaint of bilateral ankle pain/gout flare.  Patient has a history of gout for which she states that she is not on a long-term medication for.  Patient states that she had a gout flare or last night and then went to the emergency room room when they sent her over here.  Patient states that she is not able to take any colchicine as she is a kidney transplant patient.  Patient denies any other acute complaints.  She would like to discuss steroid injection which has helped a lot in the past.  She states that she has a rheumatology follow-up in September for long-term management.  She denies any other acute complaints.  She states that she has been dealing with this since January.  Her pain scale is dull achy.  Pain scale 7 out of 10.   Review of Systems: Negative except as noted in the HPI. Denies N/V/F/Ch.  Past Medical History:  Diagnosis Date  . Anemia   . Cancer ([place])    pre cervical  . 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 History of renal transplant   . Hypercholesterolemia   . 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 Renal failure, chronic    Dialysis in the past  . Vulvar dystrophy     Current Outpatient Medications:  .  acetaminophen (TYLENOL) 500 MG tablet, Take 500 mg by mouth every 6 (six) hours as needed for headache (pain)., Disp: , Rfl:  .  ALPRAZolam (XANAX) 0.5 MG tablet, Take 0.5 mg by mouth at bedtime. , Disp: , Rfl: 1 .  carvedilol (COREG) 12.5 MG tablet, Take 12.5 mg by mouth 2 (two) times daily., Disp: , Rfl: 3 .  chlorhexidine (PERIDEX) 0.12 % solution, 15 mLs by Mouth Rinse  route 2 (two) times daily., Disp: , Rfl:  .  dexamethasone (DECADRON) 0.1 % ophthalmic solution, 4 drops See admin instructions. Instill 4 drops into each ear daily at bedtime until clear (Patient not taking: Reported on 03/10/2020), Disp: , Rfl:  .  famotidine (PEPCID) 40 MG tablet, Take 40 mg by mouth 2 (two) times daily., Disp: , Rfl:  .  furosemide (LASIX) 40 MG tablet, Take 40 mg by mouth daily as needed for fluid or edema. , Disp: , Rfl:  .  gabapentin (NEURONTIN) 100 MG capsule, Take 1 capsule (100 mg total) by mouth 3 (three) times daily., Disp: 21 capsule, Rfl: 0 .  HYDROcodone-acetaminophen (NORCO) 10-325 MG tablet, Take 1 tablet by mouth every 6 (six) hours as needed., Disp: 30 tablet, Rfl: 0 .  hydrOXYzine (ATARAX/VISTARIL) 25 MG tablet, Take 25-50 mg by mouth 2 (two) times daily as needed (panic attacks). , Disp: , Rfl:  .  meclizine (ANTIVERT) 25 MG tablet, Take 1 tablet (25 mg total) by mouth 3 (three) times daily., Disp: 21 tablet, Rfl: 0 .  medroxyPROGESTERone (PROVERA) 10 MG tablet, Take 10 mg by mouth daily., Disp: , Rfl: 3 .  mycophenolate (MYFORTIC) 360 MG TBEC EC tablet, Take 720 mg by mouth 2 (two) times daily. ,  Disp: , Rfl:  .  ondansetron (ZOFRAN) 4 MG tablet, Take 1 tablet (4 mg total) by mouth every 6 (six) hours., Disp: 12 tablet, Rfl: 0 .  ondansetron (ZOFRAN-ODT) 4 MG disintegrating tablet, Take 4 mg by mouth every 8 (eight) hours as needed for nausea or vomiting. , Disp: , Rfl:  .  oxyCODONE-acetaminophen (PERCOCET/ROXICET) 5-325 MG tablet, Take 1-2 tablets by mouth every 6 (six) hours as needed for severe pain., Disp: 10 tablet, Rfl: 0 .  Potassium Chloride ER 20 MEQ TBCR, Take 20 mEq by mouth daily., Disp: , Rfl: 6 .  predniSONE (DELTASONE) 20 MG tablet, Take 3 tablets (60 mg total) by mouth daily with breakfast for 4 days. For the next four days, Disp: 12 tablet, Rfl: 0 .  predniSONE (DELTASONE) 5 MG tablet, Take 5 mg by mouth daily.  , Disp: , Rfl:  .  sertraline  (ZOLOFT) 100 MG tablet, Take 50 mg by mouth daily. , Disp: , Rfl: 5 .  simvastatin (ZOCOR) 5 MG tablet, Take 5 mg by mouth at bedtime., Disp: , Rfl:  .  sodium bicarbonate 650 MG tablet, Take 1,300 mg by mouth 2 (two) times daily., Disp: , Rfl:  .  tacrolimus (PROGRAF) 1 MG capsule, Take 1 mg by mouth 2 (two) times daily., Disp: , Rfl: 6 .  traZODone (DESYREL) 50 MG tablet, Take 50 mg by mouth at bedtime. , Disp: , Rfl:   Social History   Tobacco Use  Smoking Status Never Smoker  Smokeless Tobacco Never Used    Allergies  Allergen Reactions  . Bactrim [Sulfamethoxazole-Trimethoprim] Anaphylaxis  . Sulfamethoxazole Anaphylaxis  . Hyoscyamine Other (See Comments)    dizziness  . Keflex [Cephalexin] Hives and Swelling    Per pt, her face and lips swell up and she develops hives  . Adhesive [Tape] Rash  . Imuran [Azathioprine Sodium] Rash  . Tramadol Rash  . Warfarin Sodium Rash   Objective:  There were no vitals filed for this visit. There is no height or weight on file to calculate BMI. Constitutional Well developed. Well nourished.  Vascular Dorsalis pedis pulses palpable bilaterally. Posterior tibial pulses palpable bilaterally. Capillary refill normal to all digits.  No cyanosis or clubbing noted. Pedal hair growth normal.  Neurologic Normal speech. Oriented to person, place, and time. Epicritic sensation to light touch grossly present bilaterally.  Dermatologic Nails well groomed and normal in appearance. No open wounds. No skin lesions.  Orthopedic:  Bilateral ankle joint pain on palpation to the medial gutter.  Pain with range of motion of the ankle joint active and passive.  No pain at the Achilles tendon posterior tibial tendon peroneal tendon, ATFL.  Deep intra-articular pain noted.   Radiographs: None Assessment:   1. Capsulitis of right ankle   2. Acute left ankle pain   3. Capsulitis of left ankle   4. Acute right ankle pain   5. Acute idiopathic gout of  ankle, unspecified laterality    Plan:  Patient was evaluated and treated and all questions answered.  Bilateral ankle capsulitis secondary to acute gout flare -I explained to patient the etiology of gout flare and various treatment options were extensively discussed.  Given the patient is a kidney transplant I deferred not giving her colchicine medication.  However patient is scheduled to follow-up with rheumatology in September for which she will benefit from long-term management.  For now I believe patient will benefit from steroid injection to bilateral ankle to decrease the pain associated  with gout flare.  Patient agrees with the plan and would like to proceed with a steroid injection. -A steroid injection was performed at bilateral ankle joint using 1% plain Lidocaine and 10 mg of Kenalog. This was well tolerated.   No follow-ups on file.

## 2020-03-24 DIAGNOSIS — J984 Other disorders of lung: Secondary | ICD-10-CM | POA: Diagnosis not present

## 2020-03-24 DIAGNOSIS — N390 Urinary tract infection, site not specified: Secondary | ICD-10-CM | POA: Diagnosis not present

## 2020-03-24 DIAGNOSIS — N189 Chronic kidney disease, unspecified: Secondary | ICD-10-CM | POA: Diagnosis not present

## 2020-03-24 DIAGNOSIS — N185 Chronic kidney disease, stage 5: Secondary | ICD-10-CM | POA: Diagnosis present

## 2020-03-24 DIAGNOSIS — F419 Anxiety disorder, unspecified: Secondary | ICD-10-CM | POA: Diagnosis present

## 2020-03-24 DIAGNOSIS — M109 Gout, unspecified: Secondary | ICD-10-CM | POA: Diagnosis not present

## 2020-03-24 DIAGNOSIS — E7800 Pure hypercholesterolemia, unspecified: Secondary | ICD-10-CM | POA: Diagnosis present

## 2020-03-24 DIAGNOSIS — Z888 Allergy status to other drugs, medicaments and biological substances status: Secondary | ICD-10-CM | POA: Diagnosis not present

## 2020-03-24 DIAGNOSIS — R0602 Shortness of breath: Secondary | ICD-10-CM | POA: Diagnosis not present

## 2020-03-24 DIAGNOSIS — I120 Hypertensive chronic kidney disease with stage 5 chronic kidney disease or end stage renal disease: Secondary | ICD-10-CM | POA: Diagnosis present

## 2020-03-24 DIAGNOSIS — Z940 Kidney transplant status: Secondary | ICD-10-CM | POA: Diagnosis not present

## 2020-03-24 DIAGNOSIS — Z882 Allergy status to sulfonamides status: Secondary | ICD-10-CM | POA: Diagnosis not present

## 2020-03-24 DIAGNOSIS — E039 Hypothyroidism, unspecified: Secondary | ICD-10-CM | POA: Diagnosis present

## 2020-03-24 DIAGNOSIS — Z881 Allergy status to other antibiotic agents status: Secondary | ICD-10-CM | POA: Diagnosis not present

## 2020-03-24 DIAGNOSIS — F329 Major depressive disorder, single episode, unspecified: Secondary | ICD-10-CM | POA: Diagnosis present

## 2020-03-24 DIAGNOSIS — Z79899 Other long term (current) drug therapy: Secondary | ICD-10-CM | POA: Diagnosis not present

## 2020-03-24 DIAGNOSIS — K219 Gastro-esophageal reflux disease without esophagitis: Secondary | ICD-10-CM | POA: Diagnosis present

## 2020-03-24 DIAGNOSIS — J385 Laryngeal spasm: Secondary | ICD-10-CM | POA: Diagnosis present

## 2020-03-24 DIAGNOSIS — Z87440 Personal history of urinary (tract) infections: Secondary | ICD-10-CM | POA: Diagnosis not present

## 2020-04-05 DIAGNOSIS — K642 Third degree hemorrhoids: Secondary | ICD-10-CM | POA: Diagnosis not present

## 2020-04-05 DIAGNOSIS — D013 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: Secondary | ICD-10-CM

## 2020-04-05 HISTORY — DX: 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: D01.3

## 2020-04-14 ENCOUNTER — Other Ambulatory Visit: Payer: Self-pay

## 2020-04-14 ENCOUNTER — Ambulatory Visit (INDEPENDENT_AMBULATORY_CARE_PROVIDER_SITE_OTHER): Payer: Medicare Other | Admitting: Podiatry

## 2020-04-14 DIAGNOSIS — M7751 Other enthesopathy of right foot: Secondary | ICD-10-CM

## 2020-04-14 DIAGNOSIS — M25571 Pain in right ankle and joints of right foot: Secondary | ICD-10-CM | POA: Diagnosis not present

## 2020-04-14 DIAGNOSIS — Z940 Kidney transplant status: Secondary | ICD-10-CM | POA: Diagnosis not present

## 2020-04-14 NOTE — Progress Notes (Signed)
  Subjective:  Patient ID: Ariel [name], female    DOB: 10-Sep-1975,  MRN: [id number]  Chief Complaint  Patient presents with  . Gout    F/U Rt ankle capsulitis and gout Pt. states," they're better, but on my back heel I've been havign apulling sensation when I walk; 7/10 intermittnet.": tx: brace   44 y.o. female presents with the above complaint. History confirmed with patient.   Objective:  Physical Exam: warm, good capillary refill, no trophic changes or ulcerative lesions, normal DP and PT pulses and normal sensory exam. Left Foot: No POP left foot Right foot: No POP R 1st MPJ, no active POP right calf with excellent ROM 10 degrees no palpable dell. 5/5 Achilles strength. Assessment:   1. Capsulitis of right ankle   2. Acute right ankle pain     Plan:  Patient was evaluated and treated and all questions answered.  Gout Flare -No recent flare up  Right Achilles Pain -Discussed likely due to recent med change. She has good ROM and strength of the Achilles tendon and I don't think this is a local tendon issue. F/u should this worsen or fail to go away with her medication.  No follow-ups on file.

## 2020-04-25 DIAGNOSIS — Z1152 Encounter for screening for COVID-19: Secondary | ICD-10-CM | POA: Diagnosis not present

## 2020-04-25 DIAGNOSIS — J3489 Other specified disorders of nose and nasal sinuses: Secondary | ICD-10-CM | POA: Diagnosis not present

## 2020-04-25 DIAGNOSIS — Z20828 Contact with and (suspected) exposure to other viral communicable diseases: Secondary | ICD-10-CM | POA: Diagnosis not present

## 2020-04-27 ENCOUNTER — Ambulatory Visit (INDEPENDENT_AMBULATORY_CARE_PROVIDER_SITE_OTHER): Payer: Medicare Other | Admitting: Gastroenterology

## 2020-04-27 ENCOUNTER — Encounter: Payer: Self-pay | Admitting: Gastroenterology

## 2020-04-27 VITALS — BP 116/84 | HR 84 | Ht 59.0 in | Wt 147.2 lb

## 2020-04-27 DIAGNOSIS — R140 Abdominal distension (gaseous): Secondary | ICD-10-CM

## 2020-04-27 DIAGNOSIS — K449 Diaphragmatic hernia without obstruction or gangrene: Secondary | ICD-10-CM | POA: Diagnosis not present

## 2020-04-27 NOTE — Patient Instructions (Signed)
If you are age 43 or older, your body mass index should be between 23-30. Your Body mass index is 29.74 kg/m. If this is out of the aforementioned range listed, please consider follow up with your Primary Care Provider.  If you are age 46 or younger, your body mass index should be between 19-25. Your Body mass index is 29.74 kg/m. If this is out of the aformentioned range listed, please consider follow up with your Primary Care Provider.   Please purchase the following medications over the counter and take as directed: Miralax 1/2 capful once daily.   Follow up in 12 weeks.   Thank you,  Dr. Jackquline Denmark

## 2020-04-27 NOTE — Progress Notes (Signed)
Chief Complaint:   Referring Provider:  Madelon Lips, MD      ASSESSMENT AND PLAN;   #1. GERD with small HH and EE, currently off PPIs d/t nephrology recommendations.  #2. IBS with constipation/alternating with diarrhea with bloating. May have motility disorder. R/O SIBO.  She does feel better currently.  #3. ?Early liver cirrhosis on [place] 01/2020 (to be addressed at next visit).  Borderline platelet count recently. Had minimal ascites on [place]. Alb 2.9  Plan: -pepcid 74m po bid to continue. -Miralax 1/2 cap po qd for symptomatic management of constipation. -RTC 12 weeks -Proceed with hoidectomy. -Records regarding last colon -If problems, NCCT follwed by SIBO breath test if needed. -Further WU for ?Early cirrhosis next visit   HPI:    HDolora Ridgelyis a 44y.o. 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  We just got some records when patient had already left.  Underwent CT Abdo/pelvis without contrast 11/2019 which showed inflammatory area around renal transplant graft.  [facility] for abdominal blo[facility].  Has been seen by GYN-no GYN etiology.  Sent to GI clinic for any possible further evaluation.  With significant intermittent abdominal bloating x 5-6 years, more prominently over the last 1 year.  Currently feels better.  Has alternating diarrhea and constipation.  She is more constipated at this time and would have hard bowel movements at the frequency of 1/day.  Has occasional nausea/vomiting, better now.  Also underwent ultrasound as below 01/2020 which showed ? early liver cirrhosis.  No sodas, chocolates, chewing gums, artificial sweeteners and candy. No NSAIDs.  No alcohol.  Last EGD and colonoscopy as below.  Scheduled for repeat hemorrhoidectomy by Dr RConstance Holsternext few days.   Previous GI  procedures: -Colonoscopy (Dr. BMelina Copa 04/13/2016: neg except for minimal patchy erythema.  Recommend repeating colonoscopy at age 44 Bx- neg R and L colonic bx for microscopic colitis. -EGD 07/31/2017: Distal esophageal ulcers, small HH, erosive gastritis.  Given Prevacid.  Switched to Pepcid after 12 weeks d/t nephrology recommendations. Neg SB Bx for celiac, Neg gastric Bx for HP, Neg eso Bx for CMV/HSV/Candida. -CT Abdo/pelvis without contrast 11/25/2019 1. Again noted is a markedly enlarged right lower quadrant renal graft with diffuse edema and surrounding fat stranding. 2. Increase in volume of ascites. If there is a clinical concern for peritonitis consider further evaluation with diagnostic paracentesis. 3. Increase in diffuse subcutaneous edema involving the anterior abdominal wall. 4. Bony stigmata of renal osteodystrophy.  [place] 1. Contracted gallbladder without shadowing stone or biliary dilatation 2. Question subtle contour nodularity of the liver as could be seen with early changes of cirrhosis, correlate with LFTs. Small free fluid in the right upper quadrant adjacent to the liver 3. Atrophic echogenic native right kidney Past Medical History:  Diagnosis Date  . Anemia   . Anxiety   . Cancer ([place]    pre cervical  . 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 Chronic kidney disease   . 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 Depression   . [place]   . Gastritis, acute   . History of renal transplant   . Hypercholesterolemia   . Hypertension   . Hypomagnesemia   . IBS (irritable bowel syndrome)   . 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 Renal failure, chronic    Dialysis in the past  . [facility] spotted fever   . 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   . Vulvar dystrophy     Past Surgical History:  Procedure Laterality Date  . AV FISTULA PLACEMENT    . COLONOSCOPY  11/28/2005    [facility]  . ESOPHAGOGASTRODUODENOSCOPY  07/31/2017   Distal esophageal ulcers (likely due to gastroeesphageal reflix-biopsied). Small hiatal hernia. Erosive gastritis.  [name] HEMORRHOID SURGERY     2019, and 04/29/2020  . INSERTION OF DIALYSIS CATHETER    . [place], 2008  . KNEE SURGERY Left 2020  . PARATHYROIDECTOMY  2004   Portion on the parathyroid gland transplanted in R forearm  . REMOVAL OF A DIALYSIS CATHETER    . SIMPLE VULVECTOMY  06/19/2011   Partial simple left  . 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 TUBAL LIGATION      Family History  Problem Relation Age of Onset  . Hypertension Mother   . Hyperlipidemia Mother   . Diabetes Father   . Rectal cancer Maternal Grandmother   . Colon cancer Maternal Grandmother   . Esophageal cancer Neg Hx     Social History   Tobacco Use  . Smoking status: Never Smoker  . Smokeless tobacco: Never Used  Vaping Use  . Vaping Use: Never used  Substance Use Topics  . Alcohol use: No  . Drug use: No    Current Outpatient Medications  Medication Sig Dispense Refill  . mycophenolate (MYFORTIC) 360 MG TBEC EC tablet Take 720 mg by mouth 2 (two) times daily.     [name] acetaminophen (TYLENOL) 500 MG tablet Take 500 mg by mouth every 6 (six) hours as needed for headache (pain).    [name] ALPRAZolam (XANAX) 0.5 MG tablet Take 0.5 mg by mouth at bedtime.   1  . carvedilol (COREG) 12.5 MG tablet Take 12.5 mg by mouth 2 (two) times daily.  3  . chlorhexidine (PERIDEX) 0.12 % solution 15 mLs by Mouth Rinse route 2 (two) times daily.    . famotidine (PEPCID) 40 MG tablet Take 40 mg by mouth 2 (two) times daily.    . furosemide (LASIX) 40 MG tablet Take 40 mg by mouth daily as needed for fluid or edema.     . gabapentin (NEURONTIN) 100 MG capsule Take 1 capsule (100 mg total) by mouth 3 (three) times daily. 21 capsule 0  . hydrOXYzine (ATARAX/VISTARIL) 25 MG tablet Take 25-50 mg by  mouth 2 (two) times daily as needed (panic attacks).     . meclizine (ANTIVERT) 25 MG tablet Take 1 tablet (25 mg total) by mouth 3 (three) times daily. 21 tablet 0  . medroxyPROGESTERone (PROVERA) 10 MG tablet Take 10 mg by mouth daily.  3  . ondansetron (ZOFRAN) 4 MG tablet Take 1 tablet (4 mg total) by mouth every 6 (six) hours. 12 tablet 0  . ondansetron (ZOFRAN-ODT) 4 MG disintegrating tablet Take 4 mg by mouth every 8 (eight) hours as needed for nausea or vomiting.     . Potassium Chloride ER 20 MEQ TBCR Take 20 mEq by mouth daily.  6  . predniSONE (DELTASONE) 5 MG tablet Take 5 mg by mouth daily.      . sertraline (ZOLOFT) 100 MG tablet Take 50 mg by mouth daily.   5  . simvastatin (  ZOCOR) 5 MG tablet Take 5 mg by mouth at bedtime.    . tacrolimus (PROGRAF) 1 MG capsule Take 1 mg by mouth 2 (two) times daily.  6  . traZODone (DESYREL) 50 MG tablet Take 50 mg by mouth at bedtime.      No current facility-administered medications for this visit.    Allergies  Allergen Reactions  . Bactrim [Sulfamethoxazole-Trimethoprim] Anaphylaxis  . Sulfamethoxazole Anaphylaxis  . Hyoscyamine Other (See Comments)    dizziness  . Keflex [Cephalexin] Hives and Swelling    Per pt, her face and lips swell up and she develops hives  . Neosporin [Bacitracin-Polymyxin B]     Can use that for a little bit but will develop rash   . Adhesive [Tape] Rash  . Imuran [Azathioprine Sodium] Rash  . Tramadol Rash  . Warfarin Sodium Rash    Review of Systems:  Constitutional: Denies fever, chills, diaphoresis, appetite change and fatigue.  HEENT: Denies photophobia, eye pain, redness, hearing loss, ear pain, congestion, sore throat, rhinorrhea, sneezing, mouth sores, neck pain, neck stiffness and tinnitus.   Respiratory: Denies SOB, DOE, cough, chest tightness,  and wheezing.   Cardiovascular: Denies chest pain, palpitations and leg swelling.  Genitourinary: Denies dysuria, urgency, frequency, hematuria,  flank pain and difficulty urinating.  Musculoskeletal: Denies myalgias, back pain, joint swelling, arthralgias and gait problem.  Skin: No rash.  Neurological: Denies dizziness, seizures, syncope, weakness, light-headedness, numbness and headaches.  Hematological: Denies adenopathy. Easy bruising, personal or family bleeding history  Psychiatric/Behavioral: No anxiety or depression     Physical Exam:    BP 116/84   Pulse 84   Ht 4' 11"  (1.499 m)   Wt 147 lb 4 oz (66.8 kg)   BMI 29.74 kg/m  Wt Readings from Last 3 Encounters:  04/27/20 147 lb 4 oz (66.8 kg)  03/10/20 139 lb (63 kg)  02/28/20 144 lb (65.3 kg)   Constitutional:  Well-developed, in no acute distress. Psychiatric: Normal mood and affect. Behavior is normal. HEENT: Pupils normal.  Conjunctivae are normal. No scleral icterus. Neck supple.  Cardiovascular: Normal rate, regular rhythm. No edema Pulmonary/chest: Effort normal and breath sounds normal. No wheezing, rales or rhonchi. Abdominal: Soft, nondistended. Nontender. Bowel sounds active throughout. There are no masses palpable. No hepatomegaly.  Enlarged renal graft right lower quadrant with multiple scars Rectal:  defered Neurological: Alert and oriented to person place and time. Skin: Skin is warm and dry. No rashes noted.  Data Reviewed: I have personally reviewed following labs and imaging studies  CBC: CBC Latest Ref Rng & Units 03/10/2020 01/08/2020 12/10/2017  WBC 4.0 - 10.5 K/uL 9.0 7.3 6.6  Hemoglobin 12.0 - 15.0 g/dL 11.6(L) 11.7(L) 10.9(L)  Hematocrit 36 - 46 % 38.4 39.5 35.0(L)  Platelets 150 - 400 K/uL 141(L) 269 254    CMP: CMP Latest Ref Rng & Units 03/10/2020 01/08/2020 12/10/2017  Glucose 70 - 99 mg/dL 95 96 91  BUN 6 - 20 mg/dL 28(H) 10 13  Creatinine 0.44 - 1.00 mg/dL 2.80(H) 2.59(H) 1.83(H)  Sodium 135 - 145 mmol/L 135 137 138  Potassium 3.5 - 5.1 mmol/L 4.5 4.2 4.1  Chloride 98 - 111 mmol/L 107 110 109  CO2 22 - 32 mmol/L 17(L) 17(L) 20(L)   Calcium 8.9 - 10.3 mg/dL 7.1(L) 8.7(L) 8.3(L)  Total Protein 6.5 - 8.1 g/dL 5.6(L) 6.4(L) 6.2(L)  Total Bilirubin 0.3 - 1.2 mg/dL 0.8 0.6 0.4  Alkaline Phos 38 - 126 U/L 47 50 46  AST  15 - 41 U/L 15 11(L) 14(L)  ALT 0 - 44 U/L 18 8 10(L)   Hepatic Function Latest Ref Rng & Units 03/10/2020 01/08/2020 12/10/2017  Total Protein 6.5 - 8.1 g/dL 5.6(L) 6.4(L) 6.2(L)  Albumin 3.5 - 5.0 g/dL 2.9(L) 3.8 3.5  AST 15 - 41 U/L 15 11(L) 14(L)  ALT 0 - 44 U/L 18 8 10(L)  Alk Phosphatase 38 - 126 U/L 47 50 46  Total Bilirubin 0.3 - 1.2 mg/dL 0.8 0.6 0.4  Bilirubin, Direct 0.1 - 0.5 mg/dL - - -     Carmell Austria, MD 04/27/2020, 10:41 AM  Cc: Madelon Lips, MD

## 2020-04-29 DIAGNOSIS — I10 Essential (primary) hypertension: Secondary | ICD-10-CM | POA: Diagnosis not present

## 2020-04-29 DIAGNOSIS — K6282 Dysplasia of anus: Secondary | ICD-10-CM | POA: Diagnosis not present

## 2020-04-29 DIAGNOSIS — F329 Major depressive disorder, single episode, unspecified: Secondary | ICD-10-CM | POA: Diagnosis not present

## 2020-04-29 DIAGNOSIS — D013 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: Secondary | ICD-10-CM | POA: Diagnosis not present

## 2020-04-29 DIAGNOSIS — D84821 Immunodeficiency due to drugs: Secondary | ICD-10-CM | POA: Diagnosis not present

## 2020-04-29 DIAGNOSIS — E785 Hyperlipidemia, unspecified: Secondary | ICD-10-CM | POA: Diagnosis not present

## 2020-04-29 DIAGNOSIS — Z79899 Other long term (current) drug therapy: Secondary | ICD-10-CM | POA: Diagnosis not present

## 2020-04-29 DIAGNOSIS — F419 Anxiety disorder, unspecified: Secondary | ICD-10-CM | POA: Diagnosis not present

## 2020-04-29 DIAGNOSIS — K642 Third degree hemorrhoids: Secondary | ICD-10-CM | POA: Diagnosis not present

## 2020-04-29 DIAGNOSIS — K648 Other hemorrhoids: Secondary | ICD-10-CM | POA: Diagnosis not present

## 2020-04-29 DIAGNOSIS — Z885 Allergy status to narcotic agent status: Secondary | ICD-10-CM | POA: Diagnosis not present

## 2020-04-29 DIAGNOSIS — K219 Gastro-esophageal reflux disease without esophagitis: Secondary | ICD-10-CM | POA: Diagnosis not present

## 2020-04-29 DIAGNOSIS — Z7952 Long term (current) use of systemic steroids: Secondary | ICD-10-CM | POA: Diagnosis not present

## 2020-04-29 DIAGNOSIS — Z940 Kidney transplant status: Secondary | ICD-10-CM | POA: Diagnosis not present

## 2020-05-02 DIAGNOSIS — D013 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: Secondary | ICD-10-CM | POA: Diagnosis not present

## 2020-05-18 DIAGNOSIS — N183 Chronic kidney disease, stage 3 unspecified: Secondary | ICD-10-CM | POA: Diagnosis not present

## 2020-05-18 DIAGNOSIS — Z6828 Body mass index (BMI) 28.0-28.9, adult: Secondary | ICD-10-CM | POA: Diagnosis not present

## 2020-05-18 DIAGNOSIS — G4700 Insomnia, unspecified: Secondary | ICD-10-CM | POA: Diagnosis not present

## 2020-05-18 DIAGNOSIS — K649 Unspecified hemorrhoids: Secondary | ICD-10-CM | POA: Diagnosis not present

## 2020-05-18 DIAGNOSIS — F419 Anxiety disorder, unspecified: Secondary | ICD-10-CM | POA: Diagnosis not present

## 2020-05-24 DIAGNOSIS — T8610 Unspecified complication of kidney transplant: Secondary | ICD-10-CM | POA: Diagnosis not present

## 2020-05-24 DIAGNOSIS — Z940 Kidney transplant status: Secondary | ICD-10-CM | POA: Diagnosis not present

## 2020-05-24 DIAGNOSIS — Z79899 Other long term (current) drug therapy: Secondary | ICD-10-CM | POA: Diagnosis not present

## 2020-05-27 DIAGNOSIS — D013 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: Secondary | ICD-10-CM | POA: Diagnosis not present

## 2020-05-27 DIAGNOSIS — Z09 Encounter for follow-up examination after completed treatment for conditions other than malignant neoplasm: Secondary | ICD-10-CM | POA: Diagnosis not present

## 2020-05-27 DIAGNOSIS — B372 Candidiasis of skin and nail: Secondary | ICD-10-CM | POA: Diagnosis not present

## 2020-05-27 DIAGNOSIS — L290 Pruritus ani: Secondary | ICD-10-CM | POA: Diagnosis not present

## 2020-05-27 DIAGNOSIS — K6282 Dysplasia of anus: Secondary | ICD-10-CM | POA: Diagnosis not present

## 2020-05-27 DIAGNOSIS — K644 Residual hemorrhoidal skin tags: Secondary | ICD-10-CM | POA: Diagnosis not present

## 2020-06-04 DIAGNOSIS — N3090 Cystitis, unspecified without hematuria: Secondary | ICD-10-CM | POA: Diagnosis not present

## 2020-06-04 DIAGNOSIS — R300 Dysuria: Secondary | ICD-10-CM | POA: Diagnosis not present

## 2020-06-08 DIAGNOSIS — K629 Disease of anus and rectum, unspecified: Secondary | ICD-10-CM

## 2020-06-08 DIAGNOSIS — K6289 Other specified diseases of anus and rectum: Secondary | ICD-10-CM | POA: Insufficient documentation

## 2020-06-08 DIAGNOSIS — N289 Disorder of kidney and ureter, unspecified: Secondary | ICD-10-CM

## 2020-06-08 DIAGNOSIS — Z01419 Encounter for gynecological examination (general) (routine) without abnormal findings: Secondary | ICD-10-CM | POA: Diagnosis not present

## 2020-06-08 HISTORY — DX: Disorder of kidney and ureter, unspecified: N28.9

## 2020-06-08 HISTORY — DX: Other specified diseases of anus and rectum: K62.89

## 2020-06-08 HISTORY — DX: Disease of anus and rectum, unspecified: K62.9

## 2020-06-09 DIAGNOSIS — M25562 Pain in left knee: Secondary | ICD-10-CM | POA: Diagnosis not present

## 2020-06-09 DIAGNOSIS — M94262 Chondromalacia, left knee: Secondary | ICD-10-CM | POA: Diagnosis not present

## 2020-06-15 DIAGNOSIS — N390 Urinary tract infection, site not specified: Secondary | ICD-10-CM | POA: Diagnosis not present

## 2020-06-15 DIAGNOSIS — E559 Vitamin D deficiency, unspecified: Secondary | ICD-10-CM | POA: Diagnosis not present

## 2020-06-15 DIAGNOSIS — R0789 Other chest pain: Secondary | ICD-10-CM | POA: Diagnosis not present

## 2020-06-15 DIAGNOSIS — T8610 Unspecified complication of kidney transplant: Secondary | ICD-10-CM | POA: Diagnosis not present

## 2020-06-15 DIAGNOSIS — Z940 Kidney transplant status: Secondary | ICD-10-CM | POA: Diagnosis not present

## 2020-06-15 DIAGNOSIS — Z79899 Other long term (current) drug therapy: Secondary | ICD-10-CM | POA: Diagnosis not present

## 2020-06-15 DIAGNOSIS — R799 Abnormal finding of blood chemistry, unspecified: Secondary | ICD-10-CM | POA: Diagnosis not present

## 2020-06-15 DIAGNOSIS — N189 Chronic kidney disease, unspecified: Secondary | ICD-10-CM | POA: Diagnosis not present

## 2020-06-16 DIAGNOSIS — N390 Urinary tract infection, site not specified: Secondary | ICD-10-CM | POA: Diagnosis not present

## 2020-06-24 DIAGNOSIS — M109 Gout, unspecified: Secondary | ICD-10-CM | POA: Diagnosis not present

## 2020-06-24 DIAGNOSIS — Z79899 Other long term (current) drug therapy: Secondary | ICD-10-CM | POA: Diagnosis not present

## 2020-06-28 DIAGNOSIS — Z20828 Contact with and (suspected) exposure to other viral communicable diseases: Secondary | ICD-10-CM | POA: Diagnosis not present

## 2020-06-28 DIAGNOSIS — R519 Headache, unspecified: Secondary | ICD-10-CM | POA: Diagnosis not present

## 2020-06-28 DIAGNOSIS — J0110 Acute frontal sinusitis, unspecified: Secondary | ICD-10-CM | POA: Diagnosis not present

## 2020-06-28 DIAGNOSIS — J3489 Other specified disorders of nose and nasal sinuses: Secondary | ICD-10-CM | POA: Diagnosis not present

## 2020-07-07 DIAGNOSIS — R6889 Other general symptoms and signs: Secondary | ICD-10-CM | POA: Diagnosis not present

## 2020-07-07 DIAGNOSIS — Z20822 Contact with and (suspected) exposure to covid-19: Secondary | ICD-10-CM | POA: Diagnosis not present

## 2020-07-10 DIAGNOSIS — E872 Acidosis: Secondary | ICD-10-CM | POA: Diagnosis not present

## 2020-07-10 DIAGNOSIS — R918 Other nonspecific abnormal finding of lung field: Secondary | ICD-10-CM | POA: Diagnosis not present

## 2020-07-10 DIAGNOSIS — R0602 Shortness of breath: Secondary | ICD-10-CM | POA: Diagnosis not present

## 2020-07-10 DIAGNOSIS — N2889 Other specified disorders of kidney and ureter: Secondary | ICD-10-CM | POA: Diagnosis not present

## 2020-07-10 DIAGNOSIS — R52 Pain, unspecified: Secondary | ICD-10-CM | POA: Diagnosis not present

## 2020-07-10 DIAGNOSIS — U071 COVID-19: Secondary | ICD-10-CM | POA: Diagnosis not present

## 2020-07-10 DIAGNOSIS — J385 Laryngeal spasm: Secondary | ICD-10-CM | POA: Diagnosis not present

## 2020-07-10 DIAGNOSIS — Z940 Kidney transplant status: Secondary | ICD-10-CM | POA: Diagnosis not present

## 2020-07-13 DIAGNOSIS — E872 Acidosis: Secondary | ICD-10-CM | POA: Diagnosis not present

## 2020-07-13 DIAGNOSIS — U071 COVID-19: Secondary | ICD-10-CM | POA: Diagnosis not present

## 2020-07-13 DIAGNOSIS — J385 Laryngeal spasm: Secondary | ICD-10-CM | POA: Diagnosis not present

## 2020-07-27 DIAGNOSIS — N184 Chronic kidney disease, stage 4 (severe): Secondary | ICD-10-CM | POA: Diagnosis not present

## 2020-08-01 DIAGNOSIS — D631 Anemia in chronic kidney disease: Secondary | ICD-10-CM | POA: Diagnosis not present

## 2020-08-01 DIAGNOSIS — I10 Essential (primary) hypertension: Secondary | ICD-10-CM | POA: Diagnosis not present

## 2020-08-01 DIAGNOSIS — N189 Chronic kidney disease, unspecified: Secondary | ICD-10-CM | POA: Diagnosis not present

## 2020-08-01 DIAGNOSIS — N2581 Secondary hyperparathyroidism of renal origin: Secondary | ICD-10-CM | POA: Diagnosis not present

## 2020-08-01 DIAGNOSIS — Z8616 Personal history of COVID-19: Secondary | ICD-10-CM | POA: Diagnosis not present

## 2020-08-01 DIAGNOSIS — Z940 Kidney transplant status: Secondary | ICD-10-CM | POA: Diagnosis not present

## 2020-08-01 DIAGNOSIS — E872 Acidosis: Secondary | ICD-10-CM | POA: Diagnosis not present

## 2020-08-12 DIAGNOSIS — N184 Chronic kidney disease, stage 4 (severe): Secondary | ICD-10-CM | POA: Diagnosis not present

## 2020-08-12 DIAGNOSIS — D631 Anemia in chronic kidney disease: Secondary | ICD-10-CM | POA: Diagnosis not present

## 2020-08-17 DIAGNOSIS — Z6828 Body mass index (BMI) 28.0-28.9, adult: Secondary | ICD-10-CM | POA: Diagnosis not present

## 2020-08-17 DIAGNOSIS — G4700 Insomnia, unspecified: Secondary | ICD-10-CM | POA: Diagnosis not present

## 2020-08-17 DIAGNOSIS — F419 Anxiety disorder, unspecified: Secondary | ICD-10-CM | POA: Diagnosis not present

## 2020-08-17 DIAGNOSIS — Z23 Encounter for immunization: Secondary | ICD-10-CM | POA: Diagnosis not present

## 2020-08-17 DIAGNOSIS — D649 Anemia, unspecified: Secondary | ICD-10-CM | POA: Diagnosis not present

## 2020-08-22 DIAGNOSIS — Z79899 Other long term (current) drug therapy: Secondary | ICD-10-CM | POA: Diagnosis not present

## 2020-08-22 DIAGNOSIS — T8610 Unspecified complication of kidney transplant: Secondary | ICD-10-CM | POA: Diagnosis not present

## 2020-08-22 DIAGNOSIS — Z940 Kidney transplant status: Secondary | ICD-10-CM | POA: Diagnosis not present

## 2020-09-09 DIAGNOSIS — D631 Anemia in chronic kidney disease: Secondary | ICD-10-CM | POA: Diagnosis not present

## 2020-09-09 DIAGNOSIS — N184 Chronic kidney disease, stage 4 (severe): Secondary | ICD-10-CM | POA: Diagnosis not present

## 2020-09-16 DIAGNOSIS — N3000 Acute cystitis without hematuria: Secondary | ICD-10-CM | POA: Diagnosis not present

## 2020-10-05 DIAGNOSIS — L290 Pruritus ani: Secondary | ICD-10-CM | POA: Diagnosis not present

## 2020-10-05 DIAGNOSIS — D013 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: Secondary | ICD-10-CM | POA: Diagnosis not present

## 2020-10-05 DIAGNOSIS — B372 Candidiasis of skin and nail: Secondary | ICD-10-CM | POA: Diagnosis not present

## 2020-10-07 DIAGNOSIS — D631 Anemia in chronic kidney disease: Secondary | ICD-10-CM | POA: Diagnosis not present

## 2020-10-07 DIAGNOSIS — N184 Chronic kidney disease, stage 4 (severe): Secondary | ICD-10-CM | POA: Diagnosis not present

## 2020-10-11 DIAGNOSIS — N907 Vulvar cyst: Secondary | ICD-10-CM | POA: Diagnosis not present

## 2020-10-11 DIAGNOSIS — N9089 Other specified noninflammatory disorders of vulva and perineum: Secondary | ICD-10-CM | POA: Diagnosis not present

## 2020-10-11 DIAGNOSIS — D071 Carcinoma in situ of vulva: Secondary | ICD-10-CM | POA: Diagnosis not present

## 2020-10-14 DIAGNOSIS — I10 Essential (primary) hypertension: Secondary | ICD-10-CM | POA: Diagnosis not present

## 2020-10-14 DIAGNOSIS — D013 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: Secondary | ICD-10-CM | POA: Diagnosis not present

## 2020-10-14 DIAGNOSIS — Z888 Allergy status to other drugs, medicaments and biological substances status: Secondary | ICD-10-CM | POA: Diagnosis not present

## 2020-10-14 DIAGNOSIS — K219 Gastro-esophageal reflux disease without esophagitis: Secondary | ICD-10-CM | POA: Diagnosis not present

## 2020-10-14 DIAGNOSIS — K6282 Dysplasia of anus: Secondary | ICD-10-CM | POA: Diagnosis not present

## 2020-10-14 DIAGNOSIS — Z79899 Other long term (current) drug therapy: Secondary | ICD-10-CM | POA: Diagnosis not present

## 2020-10-14 DIAGNOSIS — Z885 Allergy status to narcotic agent status: Secondary | ICD-10-CM | POA: Diagnosis not present

## 2020-10-14 DIAGNOSIS — E785 Hyperlipidemia, unspecified: Secondary | ICD-10-CM | POA: Diagnosis not present

## 2020-10-19 DIAGNOSIS — K6282 Dysplasia of anus: Secondary | ICD-10-CM | POA: Diagnosis not present

## 2020-10-29 IMAGING — CT CT ABD-PELV W/O CM
1 of 2 series · 13 of 32 positions shown, 18 images · non-contrast
Comparison: 10/30/2019

CLINICAL DATA: Abnormal bloating, nausea and vomiting.

EXAM:
CT ABDOMEN AND PELVIS WITHOUT CONTRAST
TECHNIQUE: Multidetector CT imaging of the abdomen and pelvis was performed
following the standard protocol without IV contrast.

[Series 2: abd/pelvis w/(date) · axial · 0.82mm/px · z∈[-413,-53]mm · 13 of 82 slices shown, 18 images]
[im 5/82  soft-tissue]
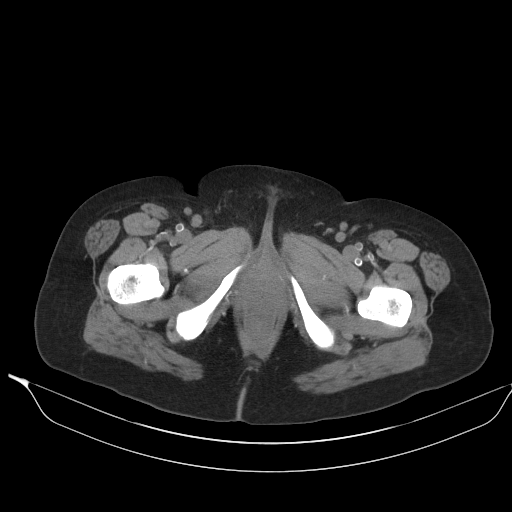
[im 5/82  bone]
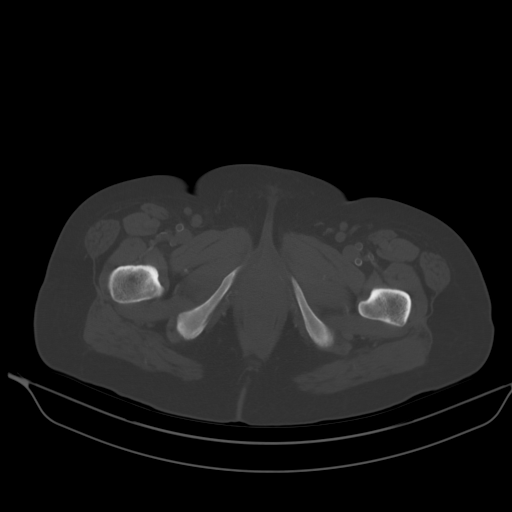
[im 13/82  soft-tissue]
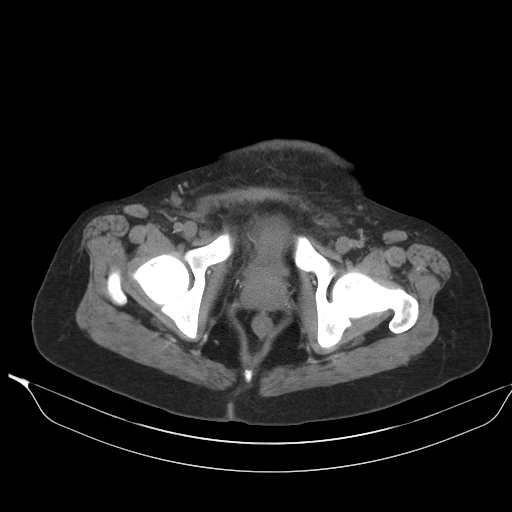
[im 18/82  soft-tissue]
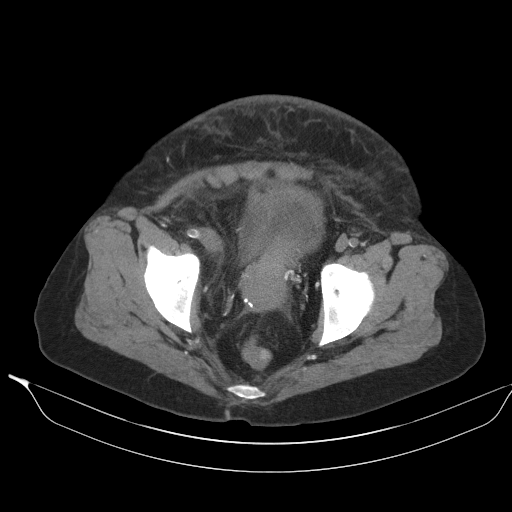
[im 26/82  soft-tissue]
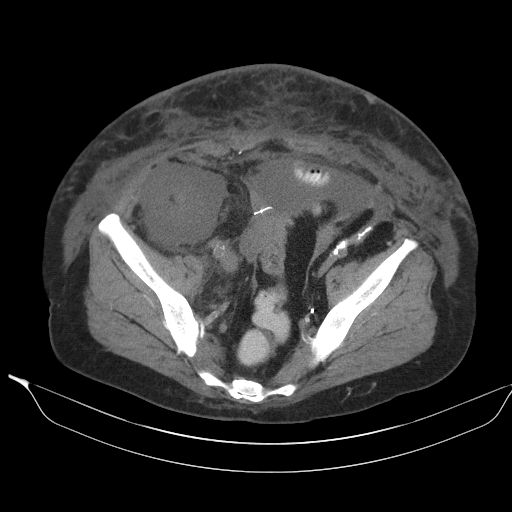
[im 30/82  soft-tissue]
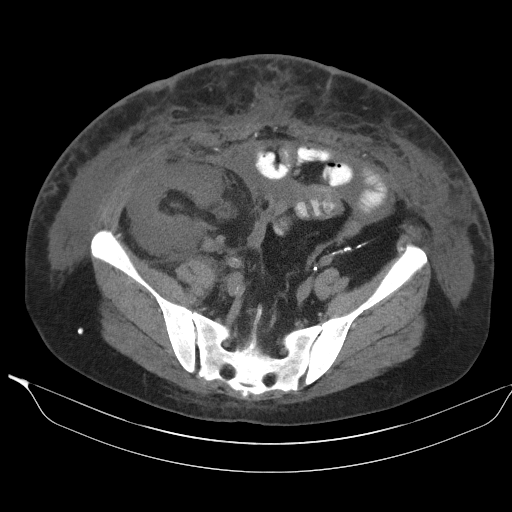
[im 39/82  soft-tissue]
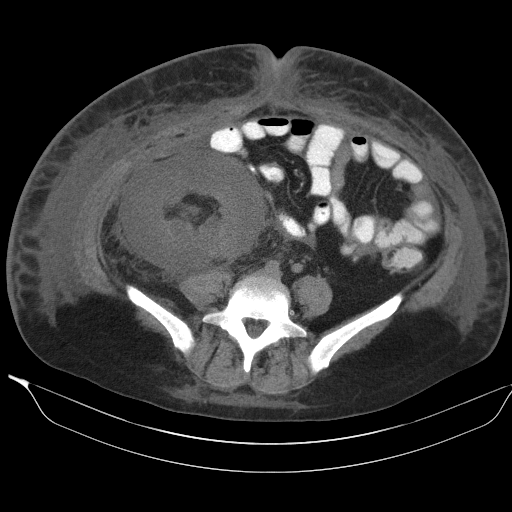
[im 43/82  soft-tissue]
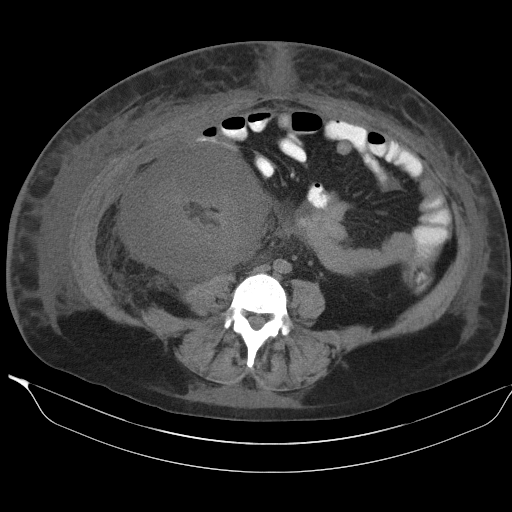
[im 52/82  soft-tissue]
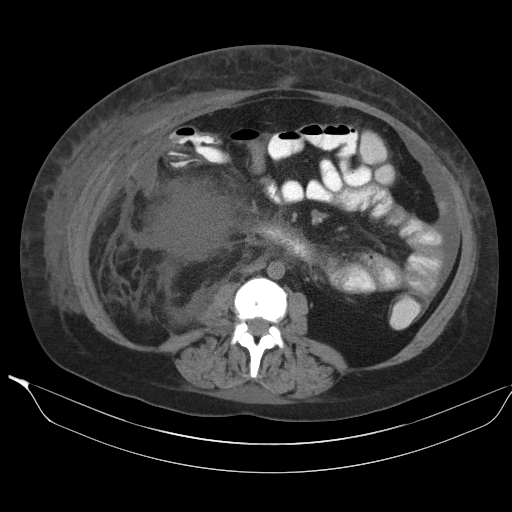
[im 56/82  soft-tissue]
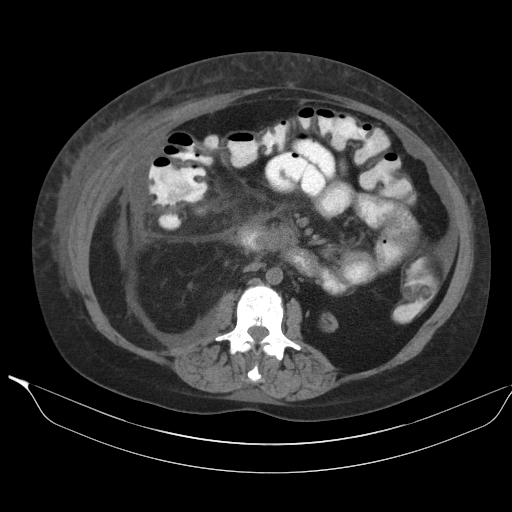
[im 56/82  bone]
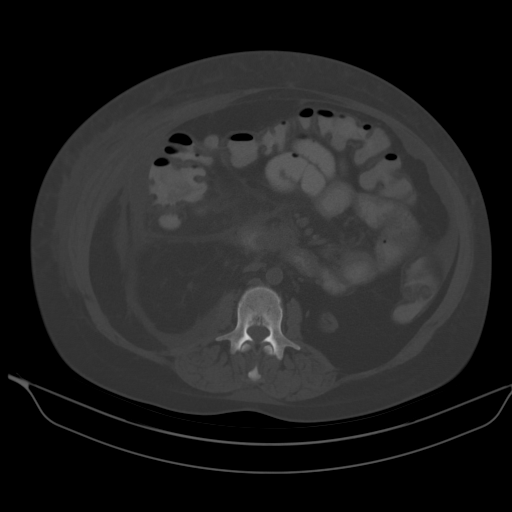
[im 64/82  soft-tissue]
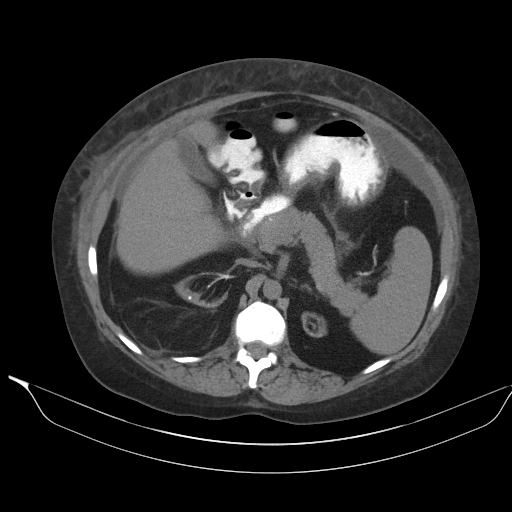
[im 64/82  lung]
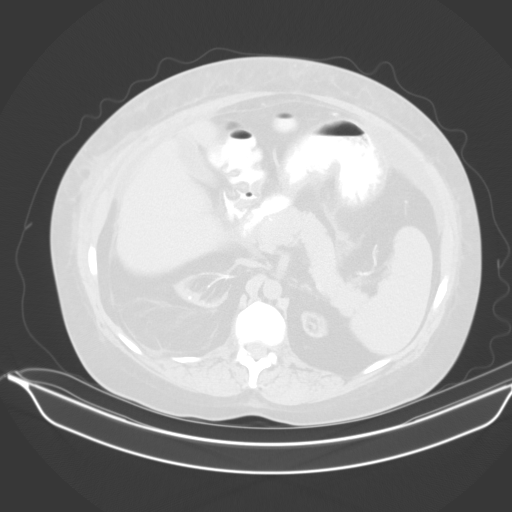
[im 69/82  soft-tissue]
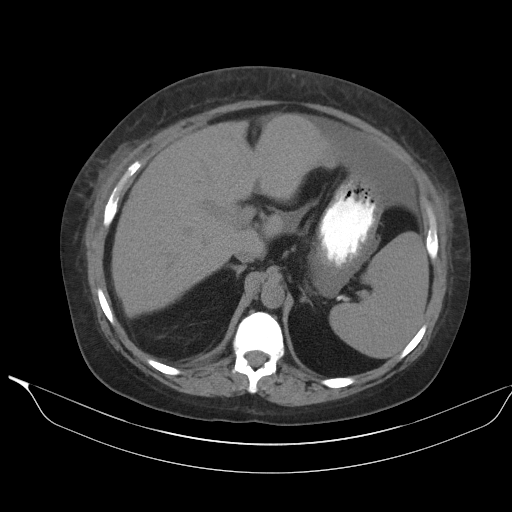
[im 69/82  lung]
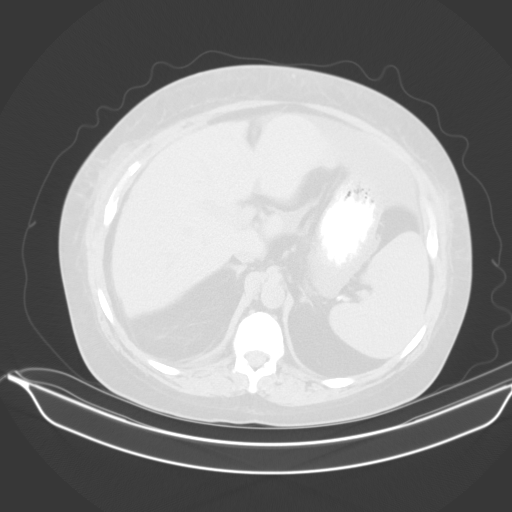
[im 73/82  lung]
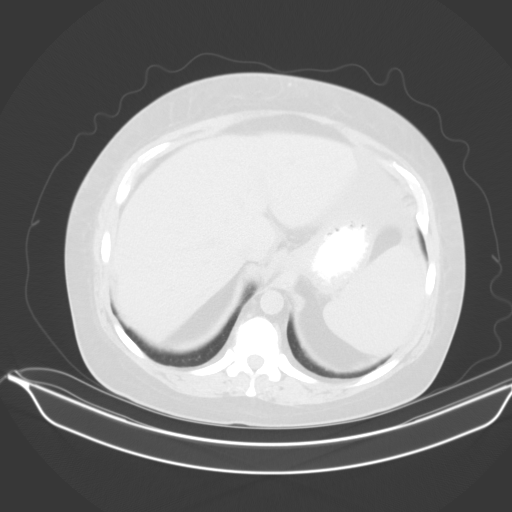
[im 77/82  soft-tissue]
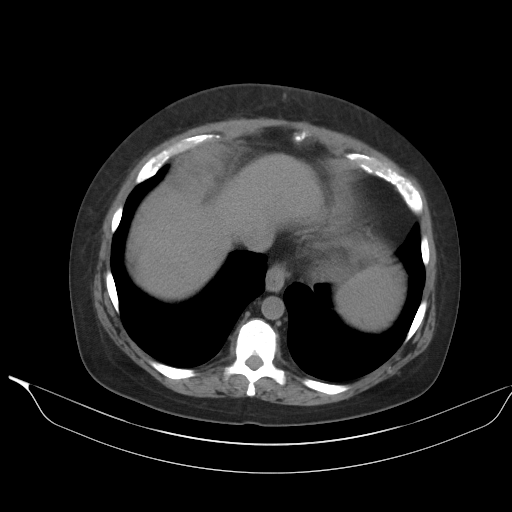
[im 77/82  lung]
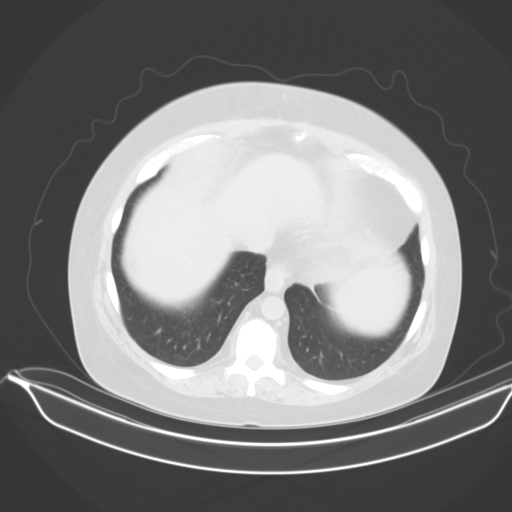

[13 of 32 positions shown; findings below may reference images not displayed]

FINDINGS: Lower chest: Multiple high attenuation nodules identified within the
posterior right lower lobe are un noted and appear unchanged. Likely
related to previous aspiration.

Hepatobiliary: No focal liver abnormality is seen. No gallstones,
gallbladder wall thickening, or biliary dilatation.

Pancreas: Unremarkable. No pancreatic ductal dilatation or
surrounding inflammatory changes.

Spleen: Normal in size without focal abnormality.

Adrenals/Urinary Tract: Normal appearance of the adrenal glands.
Bilateral in stage kidneys are identified. The urinary bladder
appears collapsed.

Renal graft is again identified within the right lower quadrant of
the abdomen. Similar to the previous exam this appears markedly
edematous measuring 11.7 x 9.6 by 16.9 cm (volume = 990 cm^3).
There is diffuse peri-graft fat stranding identified, similar to the
previous exam. No hydronephrosis. No discrete perinephric fluid
collection identified.

Stomach/Bowel: The stomach appears nondistended. The small bowel
loops are nondilated. The appendix is visualized and appears normal.
No pathologic dilatation of the colon.

Vascular/Lymphatic: Normal appearance of the abdominal aorta. No
aneurysm. No abdominal no pelvic adenopathy.

Reproductive: Uterus and bilateral adnexa are unremarkable.

Other: There is a moderate volume of and pelvis, increased from
previous exam. Diffuse subcutaneous edema involving the anterior
abdominal wall has increased from previous exam.Mild diffuse skin
thickening identified.

Musculoskeletal: No acute or significant osseous findings. Bony
stigmata of renal osteodystrophy is again noted. No acute or
suspicious osseous abnormality.
IMPRESSION: 1. Again noted is a markedly enlarged right lower quadrant renal
graft with diffuse edema and surrounding fat stranding.
2. Increase in volume of ascites. If there is a clinical concern for
peritonitis consider further evaluation with diagnostic
paracentesis.
3. Increase in diffuse subcutaneous edema involving the anterior
abdominal wall.
4. Bony stigmata of renal osteodystrophy.

## 2020-10-31 DIAGNOSIS — D071 Carcinoma in situ of vulva: Secondary | ICD-10-CM | POA: Diagnosis not present

## 2020-11-01 DIAGNOSIS — Z940 Kidney transplant status: Secondary | ICD-10-CM | POA: Diagnosis not present

## 2020-11-01 DIAGNOSIS — D071 Carcinoma in situ of vulva: Secondary | ICD-10-CM | POA: Diagnosis not present

## 2020-11-02 DIAGNOSIS — Z6830 Body mass index (BMI) 30.0-30.9, adult: Secondary | ICD-10-CM | POA: Diagnosis not present

## 2020-11-02 DIAGNOSIS — L819 Disorder of pigmentation, unspecified: Secondary | ICD-10-CM | POA: Diagnosis not present

## 2020-11-03 DIAGNOSIS — M109 Gout, unspecified: Secondary | ICD-10-CM | POA: Diagnosis not present

## 2020-11-03 DIAGNOSIS — N189 Chronic kidney disease, unspecified: Secondary | ICD-10-CM | POA: Diagnosis not present

## 2020-11-03 DIAGNOSIS — E872 Acidosis: Secondary | ICD-10-CM | POA: Diagnosis not present

## 2020-11-03 DIAGNOSIS — I10 Essential (primary) hypertension: Secondary | ICD-10-CM | POA: Diagnosis not present

## 2020-11-03 DIAGNOSIS — D631 Anemia in chronic kidney disease: Secondary | ICD-10-CM | POA: Diagnosis not present

## 2020-11-03 DIAGNOSIS — Z940 Kidney transplant status: Secondary | ICD-10-CM | POA: Diagnosis not present

## 2020-11-03 DIAGNOSIS — E785 Hyperlipidemia, unspecified: Secondary | ICD-10-CM | POA: Diagnosis not present

## 2020-11-04 DIAGNOSIS — N184 Chronic kidney disease, stage 4 (severe): Secondary | ICD-10-CM | POA: Diagnosis not present

## 2020-11-04 DIAGNOSIS — D631 Anemia in chronic kidney disease: Secondary | ICD-10-CM | POA: Diagnosis not present

## 2020-11-11 DIAGNOSIS — D013 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: Secondary | ICD-10-CM | POA: Diagnosis not present

## 2020-11-11 DIAGNOSIS — L290 Pruritus ani: Secondary | ICD-10-CM | POA: Diagnosis not present

## 2020-11-14 DIAGNOSIS — H6121 Impacted cerumen, right ear: Secondary | ICD-10-CM | POA: Diagnosis not present

## 2020-11-14 DIAGNOSIS — Z6830 Body mass index (BMI) 30.0-30.9, adult: Secondary | ICD-10-CM | POA: Diagnosis not present

## 2020-11-14 DIAGNOSIS — F419 Anxiety disorder, unspecified: Secondary | ICD-10-CM | POA: Diagnosis not present

## 2020-11-14 DIAGNOSIS — G4700 Insomnia, unspecified: Secondary | ICD-10-CM | POA: Diagnosis not present

## 2020-11-16 DIAGNOSIS — L811 Chloasma: Secondary | ICD-10-CM | POA: Diagnosis not present

## 2020-11-18 DIAGNOSIS — H6983 Other specified disorders of Eustachian tube, bilateral: Secondary | ICD-10-CM | POA: Diagnosis not present

## 2020-11-18 DIAGNOSIS — H9190 Unspecified hearing loss, unspecified ear: Secondary | ICD-10-CM | POA: Diagnosis not present

## 2020-11-18 DIAGNOSIS — J342 Deviated nasal septum: Secondary | ICD-10-CM | POA: Diagnosis not present

## 2020-11-18 DIAGNOSIS — J343 Hypertrophy of nasal turbinates: Secondary | ICD-10-CM | POA: Diagnosis not present

## 2020-11-18 DIAGNOSIS — H6121 Impacted cerumen, right ear: Secondary | ICD-10-CM | POA: Diagnosis not present

## 2020-11-18 DIAGNOSIS — H61303 Acquired stenosis of external ear canal, unspecified, bilateral: Secondary | ICD-10-CM | POA: Diagnosis not present

## 2020-11-18 DIAGNOSIS — R438 Other disturbances of smell and taste: Secondary | ICD-10-CM | POA: Diagnosis not present

## 2020-11-18 DIAGNOSIS — H6593 Unspecified nonsuppurative otitis media, bilateral: Secondary | ICD-10-CM | POA: Diagnosis not present

## 2020-11-18 DIAGNOSIS — R0981 Nasal congestion: Secondary | ICD-10-CM | POA: Diagnosis not present

## 2020-11-22 DIAGNOSIS — L659 Nonscarring hair loss, unspecified: Secondary | ICD-10-CM | POA: Diagnosis not present

## 2020-11-22 DIAGNOSIS — M545 Low back pain, unspecified: Secondary | ICD-10-CM | POA: Diagnosis not present

## 2020-11-22 DIAGNOSIS — M25559 Pain in unspecified hip: Secondary | ICD-10-CM | POA: Diagnosis not present

## 2020-11-22 DIAGNOSIS — R5383 Other fatigue: Secondary | ICD-10-CM | POA: Diagnosis not present

## 2020-11-22 DIAGNOSIS — Z6832 Body mass index (BMI) 32.0-32.9, adult: Secondary | ICD-10-CM | POA: Diagnosis not present

## 2020-12-09 DIAGNOSIS — Z940 Kidney transplant status: Secondary | ICD-10-CM | POA: Diagnosis not present

## 2020-12-12 DIAGNOSIS — H61303 Acquired stenosis of external ear canal, unspecified, bilateral: Secondary | ICD-10-CM | POA: Diagnosis not present

## 2020-12-12 DIAGNOSIS — J342 Deviated nasal septum: Secondary | ICD-10-CM | POA: Diagnosis not present

## 2020-12-12 DIAGNOSIS — Z789 Other specified health status: Secondary | ICD-10-CM | POA: Diagnosis not present

## 2020-12-12 DIAGNOSIS — J343 Hypertrophy of nasal turbinates: Secondary | ICD-10-CM | POA: Diagnosis not present

## 2020-12-12 DIAGNOSIS — H6983 Other specified disorders of Eustachian tube, bilateral: Secondary | ICD-10-CM | POA: Diagnosis not present

## 2020-12-12 DIAGNOSIS — H6690 Otitis media, unspecified, unspecified ear: Secondary | ICD-10-CM | POA: Diagnosis not present

## 2020-12-14 DIAGNOSIS — N184 Chronic kidney disease, stage 4 (severe): Secondary | ICD-10-CM | POA: Diagnosis not present

## 2020-12-14 DIAGNOSIS — D631 Anemia in chronic kidney disease: Secondary | ICD-10-CM | POA: Diagnosis not present

## 2020-12-19 ENCOUNTER — Other Ambulatory Visit (HOSPITAL_COMMUNITY): Payer: Self-pay | Admitting: Nephrology

## 2020-12-19 DIAGNOSIS — R600 Localized edema: Secondary | ICD-10-CM

## 2020-12-21 ENCOUNTER — Other Ambulatory Visit: Payer: Self-pay

## 2020-12-21 ENCOUNTER — Ambulatory Visit (INDEPENDENT_AMBULATORY_CARE_PROVIDER_SITE_OTHER): Payer: Medicare Other

## 2020-12-21 DIAGNOSIS — R600 Localized edema: Secondary | ICD-10-CM

## 2020-12-21 LAB — ECHOCARDIOGRAM COMPLETE: S' Lateral: 2.1 cm

## 2020-12-21 NOTE — Progress Notes (Signed)
Complete echocardiogram performed.  Jimmy Makari Portman RDCS, RVT  

## 2020-12-30 DIAGNOSIS — R0602 Shortness of breath: Secondary | ICD-10-CM | POA: Diagnosis not present

## 2020-12-30 DIAGNOSIS — Z20828 Contact with and (suspected) exposure to other viral communicable diseases: Secondary | ICD-10-CM | POA: Diagnosis not present

## 2020-12-30 DIAGNOSIS — J029 Acute pharyngitis, unspecified: Secondary | ICD-10-CM | POA: Diagnosis not present

## 2020-12-30 DIAGNOSIS — R051 Acute cough: Secondary | ICD-10-CM | POA: Diagnosis not present

## 2020-12-30 DIAGNOSIS — R0600 Dyspnea, unspecified: Secondary | ICD-10-CM | POA: Diagnosis not present

## 2020-12-30 DIAGNOSIS — Z1159 Encounter for screening for other viral diseases: Secondary | ICD-10-CM | POA: Diagnosis not present

## 2020-12-30 DIAGNOSIS — J449 Chronic obstructive pulmonary disease, unspecified: Secondary | ICD-10-CM | POA: Diagnosis not present

## 2021-01-05 ENCOUNTER — Other Ambulatory Visit: Payer: Self-pay

## 2021-01-05 ENCOUNTER — Ambulatory Visit (INDEPENDENT_AMBULATORY_CARE_PROVIDER_SITE_OTHER): Payer: Medicare Other | Admitting: Podiatry

## 2021-01-05 ENCOUNTER — Ambulatory Visit (INDEPENDENT_AMBULATORY_CARE_PROVIDER_SITE_OTHER): Payer: Medicare Other

## 2021-01-05 ENCOUNTER — Encounter: Payer: Self-pay | Admitting: Podiatry

## 2021-01-05 DIAGNOSIS — M7750 Other enthesopathy of unspecified foot: Secondary | ICD-10-CM | POA: Diagnosis not present

## 2021-01-05 DIAGNOSIS — M25571 Pain in right ankle and joints of right foot: Secondary | ICD-10-CM | POA: Diagnosis not present

## 2021-01-05 NOTE — Progress Notes (Signed)
  Subjective:  Patient ID: Ariel [name], female    DOB: Nov 02, 1975,  MRN: [id number]  Chief Complaint  Patient presents with  . Foot Problem    I am having some right ankle pain and it hurts with pressure and I can not put weight on it and happened Tuesday morning   45 y.o. female presents with the above complaint. History confirmed with patient.   Objective:  Physical Exam: warm, good capillary refill, no trophic changes or ulcerative lesions, normal DP and PT pulses and normal sensory exam. Left Foot: No POP left foot Right foot: No POP R 1st MPJ, no active POP right calf with excellent ROM 10 degrees no palpable dell. 5/5 Achilles strength. No discrete POP Assessment:   1. Acute right ankle pain   2. Tendonitis of ankle    Plan:  Patient was evaluated and treated and all questions answered.  Gout Flare -No recent flare up   Tendonitis right ankle -Localized by pt to medial arch. Likely transient. Continue boot for 2-3 days if needed if she cannot put weight on her foot. Otherwise f/u as needed.   No follow-ups on file.

## 2021-01-06 DIAGNOSIS — Z8709 Personal history of other diseases of the respiratory system: Secondary | ICD-10-CM | POA: Diagnosis not present

## 2021-01-06 DIAGNOSIS — R0600 Dyspnea, unspecified: Secondary | ICD-10-CM | POA: Diagnosis not present

## 2021-01-06 DIAGNOSIS — J3489 Other specified disorders of nose and nasal sinuses: Secondary | ICD-10-CM | POA: Diagnosis not present

## 2021-01-06 DIAGNOSIS — J342 Deviated nasal septum: Secondary | ICD-10-CM | POA: Diagnosis not present

## 2021-01-06 DIAGNOSIS — Z8616 Personal history of COVID-19: Secondary | ICD-10-CM | POA: Diagnosis not present

## 2021-01-06 DIAGNOSIS — J343 Hypertrophy of nasal turbinates: Secondary | ICD-10-CM | POA: Diagnosis not present

## 2021-01-06 DIAGNOSIS — R438 Other disturbances of smell and taste: Secondary | ICD-10-CM | POA: Diagnosis not present

## 2021-01-06 DIAGNOSIS — R0981 Nasal congestion: Secondary | ICD-10-CM | POA: Diagnosis not present

## 2021-01-11 DIAGNOSIS — D631 Anemia in chronic kidney disease: Secondary | ICD-10-CM | POA: Diagnosis not present

## 2021-01-11 DIAGNOSIS — Z6832 Body mass index (BMI) 32.0-32.9, adult: Secondary | ICD-10-CM | POA: Diagnosis not present

## 2021-01-11 DIAGNOSIS — N184 Chronic kidney disease, stage 4 (severe): Secondary | ICD-10-CM | POA: Diagnosis not present

## 2021-01-11 DIAGNOSIS — R059 Cough, unspecified: Secondary | ICD-10-CM | POA: Diagnosis not present

## 2021-01-25 DIAGNOSIS — Z940 Kidney transplant status: Secondary | ICD-10-CM | POA: Diagnosis not present

## 2021-01-25 DIAGNOSIS — R799 Abnormal finding of blood chemistry, unspecified: Secondary | ICD-10-CM | POA: Diagnosis not present

## 2021-01-31 DIAGNOSIS — Z940 Kidney transplant status: Secondary | ICD-10-CM | POA: Diagnosis not present

## 2021-01-31 DIAGNOSIS — D013 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: Secondary | ICD-10-CM | POA: Diagnosis not present

## 2021-01-31 DIAGNOSIS — I10 Essential (primary) hypertension: Secondary | ICD-10-CM | POA: Diagnosis not present

## 2021-01-31 DIAGNOSIS — Z79899 Other long term (current) drug therapy: Secondary | ICD-10-CM | POA: Diagnosis not present

## 2021-01-31 DIAGNOSIS — L290 Pruritus ani: Secondary | ICD-10-CM | POA: Diagnosis not present

## 2021-02-08 DIAGNOSIS — D631 Anemia in chronic kidney disease: Secondary | ICD-10-CM | POA: Diagnosis not present

## 2021-02-08 DIAGNOSIS — N184 Chronic kidney disease, stage 4 (severe): Secondary | ICD-10-CM | POA: Diagnosis not present

## 2021-02-14 DIAGNOSIS — N390 Urinary tract infection, site not specified: Secondary | ICD-10-CM | POA: Diagnosis not present

## 2021-02-14 DIAGNOSIS — R809 Proteinuria, unspecified: Secondary | ICD-10-CM | POA: Diagnosis not present

## 2021-02-14 DIAGNOSIS — Z940 Kidney transplant status: Secondary | ICD-10-CM | POA: Diagnosis not present

## 2021-02-14 DIAGNOSIS — E559 Vitamin D deficiency, unspecified: Secondary | ICD-10-CM | POA: Diagnosis not present

## 2021-02-14 DIAGNOSIS — Z79899 Other long term (current) drug therapy: Secondary | ICD-10-CM | POA: Diagnosis not present

## 2021-02-14 DIAGNOSIS — R799 Abnormal finding of blood chemistry, unspecified: Secondary | ICD-10-CM | POA: Diagnosis not present

## 2021-02-15 DIAGNOSIS — F419 Anxiety disorder, unspecified: Secondary | ICD-10-CM | POA: Diagnosis not present

## 2021-02-15 DIAGNOSIS — Z6833 Body mass index (BMI) 33.0-33.9, adult: Secondary | ICD-10-CM | POA: Diagnosis not present

## 2021-02-15 DIAGNOSIS — G4700 Insomnia, unspecified: Secondary | ICD-10-CM | POA: Diagnosis not present

## 2021-02-15 DIAGNOSIS — N183 Chronic kidney disease, stage 3 unspecified: Secondary | ICD-10-CM | POA: Diagnosis not present

## 2021-03-08 DIAGNOSIS — N184 Chronic kidney disease, stage 4 (severe): Secondary | ICD-10-CM | POA: Diagnosis not present

## 2021-03-08 DIAGNOSIS — D631 Anemia in chronic kidney disease: Secondary | ICD-10-CM | POA: Diagnosis not present

## 2021-03-15 DIAGNOSIS — I129 Hypertensive chronic kidney disease with stage 1 through stage 4 chronic kidney disease, or unspecified chronic kidney disease: Secondary | ICD-10-CM | POA: Diagnosis not present

## 2021-03-15 DIAGNOSIS — E872 Acidosis: Secondary | ICD-10-CM | POA: Diagnosis not present

## 2021-03-15 DIAGNOSIS — D631 Anemia in chronic kidney disease: Secondary | ICD-10-CM | POA: Diagnosis not present

## 2021-03-15 DIAGNOSIS — N184 Chronic kidney disease, stage 4 (severe): Secondary | ICD-10-CM | POA: Diagnosis not present

## 2021-03-15 DIAGNOSIS — K6282 Dysplasia of anus: Secondary | ICD-10-CM | POA: Diagnosis not present

## 2021-03-15 DIAGNOSIS — Z940 Kidney transplant status: Secondary | ICD-10-CM | POA: Diagnosis not present

## 2021-03-15 DIAGNOSIS — M109 Gout, unspecified: Secondary | ICD-10-CM | POA: Diagnosis not present

## 2021-03-15 DIAGNOSIS — N2581 Secondary hyperparathyroidism of renal origin: Secondary | ICD-10-CM | POA: Diagnosis not present

## 2021-03-21 DIAGNOSIS — N189 Chronic kidney disease, unspecified: Secondary | ICD-10-CM | POA: Diagnosis not present

## 2021-03-21 DIAGNOSIS — D631 Anemia in chronic kidney disease: Secondary | ICD-10-CM | POA: Diagnosis not present

## 2021-03-29 DIAGNOSIS — N189 Chronic kidney disease, unspecified: Secondary | ICD-10-CM | POA: Diagnosis not present

## 2021-03-29 DIAGNOSIS — D631 Anemia in chronic kidney disease: Secondary | ICD-10-CM | POA: Diagnosis not present

## 2021-04-05 DIAGNOSIS — D631 Anemia in chronic kidney disease: Secondary | ICD-10-CM | POA: Diagnosis not present

## 2021-04-05 DIAGNOSIS — N184 Chronic kidney disease, stage 4 (severe): Secondary | ICD-10-CM | POA: Diagnosis not present

## 2021-04-11 DIAGNOSIS — I129 Hypertensive chronic kidney disease with stage 1 through stage 4 chronic kidney disease, or unspecified chronic kidney disease: Secondary | ICD-10-CM | POA: Diagnosis not present

## 2021-04-11 DIAGNOSIS — N179 Acute kidney failure, unspecified: Secondary | ICD-10-CM | POA: Diagnosis not present

## 2021-04-11 DIAGNOSIS — R5383 Other fatigue: Secondary | ICD-10-CM | POA: Diagnosis not present

## 2021-04-11 DIAGNOSIS — N3001 Acute cystitis with hematuria: Secondary | ICD-10-CM | POA: Diagnosis not present

## 2021-04-11 DIAGNOSIS — E669 Obesity, unspecified: Secondary | ICD-10-CM | POA: Diagnosis present

## 2021-04-11 DIAGNOSIS — E890 Postprocedural hypothyroidism: Secondary | ICD-10-CM | POA: Diagnosis not present

## 2021-04-11 DIAGNOSIS — Z7952 Long term (current) use of systemic steroids: Secondary | ICD-10-CM | POA: Diagnosis not present

## 2021-04-11 DIAGNOSIS — F419 Anxiety disorder, unspecified: Secondary | ICD-10-CM | POA: Diagnosis present

## 2021-04-11 DIAGNOSIS — K219 Gastro-esophageal reflux disease without esophagitis: Secondary | ICD-10-CM | POA: Diagnosis present

## 2021-04-11 DIAGNOSIS — Z6834 Body mass index (BMI) 34.0-34.9, adult: Secondary | ICD-10-CM | POA: Diagnosis not present

## 2021-04-11 DIAGNOSIS — Z87440 Personal history of urinary (tract) infections: Secondary | ICD-10-CM | POA: Diagnosis not present

## 2021-04-11 DIAGNOSIS — E7800 Pure hypercholesterolemia, unspecified: Secondary | ICD-10-CM | POA: Diagnosis present

## 2021-04-11 DIAGNOSIS — Z79899 Other long term (current) drug therapy: Secondary | ICD-10-CM | POA: Diagnosis not present

## 2021-04-11 DIAGNOSIS — D631 Anemia in chronic kidney disease: Secondary | ICD-10-CM | POA: Diagnosis present

## 2021-04-11 DIAGNOSIS — N1832 Chronic kidney disease, stage 3b: Secondary | ICD-10-CM | POA: Diagnosis not present

## 2021-04-11 DIAGNOSIS — Z8701 Personal history of pneumonia (recurrent): Secondary | ICD-10-CM | POA: Diagnosis not present

## 2021-04-11 DIAGNOSIS — N189 Chronic kidney disease, unspecified: Secondary | ICD-10-CM | POA: Diagnosis not present

## 2021-04-11 DIAGNOSIS — M109 Gout, unspecified: Secondary | ICD-10-CM | POA: Diagnosis present

## 2021-04-11 DIAGNOSIS — T452X5A Adverse effect of vitamins, initial encounter: Secondary | ICD-10-CM | POA: Diagnosis present

## 2021-04-11 DIAGNOSIS — Z888 Allergy status to other drugs, medicaments and biological substances status: Secondary | ICD-10-CM | POA: Diagnosis not present

## 2021-04-11 DIAGNOSIS — Z881 Allergy status to other antibiotic agents status: Secondary | ICD-10-CM | POA: Diagnosis not present

## 2021-04-11 DIAGNOSIS — Z940 Kidney transplant status: Secondary | ICD-10-CM | POA: Diagnosis not present

## 2021-04-18 DIAGNOSIS — M545 Low back pain, unspecified: Secondary | ICD-10-CM | POA: Diagnosis not present

## 2021-04-27 DIAGNOSIS — M94262 Chondromalacia, left knee: Secondary | ICD-10-CM | POA: Diagnosis not present

## 2021-05-01 DIAGNOSIS — Z79899 Other long term (current) drug therapy: Secondary | ICD-10-CM | POA: Diagnosis not present

## 2021-05-01 DIAGNOSIS — H5213 Myopia, bilateral: Secondary | ICD-10-CM | POA: Diagnosis not present

## 2021-05-03 DIAGNOSIS — N184 Chronic kidney disease, stage 4 (severe): Secondary | ICD-10-CM | POA: Diagnosis not present

## 2021-05-03 DIAGNOSIS — D631 Anemia in chronic kidney disease: Secondary | ICD-10-CM | POA: Diagnosis not present

## 2021-05-04 DIAGNOSIS — H6501 Acute serous otitis media, right ear: Secondary | ICD-10-CM | POA: Diagnosis not present

## 2021-05-04 DIAGNOSIS — J0100 Acute maxillary sinusitis, unspecified: Secondary | ICD-10-CM | POA: Diagnosis not present

## 2021-05-09 DIAGNOSIS — R300 Dysuria: Secondary | ICD-10-CM | POA: Diagnosis not present

## 2021-05-09 DIAGNOSIS — F339 Major depressive disorder, recurrent, unspecified: Secondary | ICD-10-CM | POA: Diagnosis not present

## 2021-05-09 DIAGNOSIS — N183 Chronic kidney disease, stage 3 unspecified: Secondary | ICD-10-CM | POA: Diagnosis not present

## 2021-05-09 DIAGNOSIS — G4700 Insomnia, unspecified: Secondary | ICD-10-CM | POA: Diagnosis not present

## 2021-05-17 DIAGNOSIS — N184 Chronic kidney disease, stage 4 (severe): Secondary | ICD-10-CM | POA: Diagnosis not present

## 2021-05-17 DIAGNOSIS — D631 Anemia in chronic kidney disease: Secondary | ICD-10-CM | POA: Diagnosis not present

## 2021-05-18 DIAGNOSIS — R7309 Other abnormal glucose: Secondary | ICD-10-CM | POA: Diagnosis not present

## 2021-05-18 DIAGNOSIS — N390 Urinary tract infection, site not specified: Secondary | ICD-10-CM | POA: Diagnosis not present

## 2021-05-18 DIAGNOSIS — N184 Chronic kidney disease, stage 4 (severe): Secondary | ICD-10-CM | POA: Diagnosis not present

## 2021-05-19 DIAGNOSIS — N189 Chronic kidney disease, unspecified: Secondary | ICD-10-CM | POA: Diagnosis not present

## 2021-05-19 DIAGNOSIS — N390 Urinary tract infection, site not specified: Secondary | ICD-10-CM | POA: Diagnosis not present

## 2021-05-19 DIAGNOSIS — Z940 Kidney transplant status: Secondary | ICD-10-CM | POA: Diagnosis not present

## 2021-05-19 DIAGNOSIS — D631 Anemia in chronic kidney disease: Secondary | ICD-10-CM | POA: Diagnosis not present

## 2021-05-19 DIAGNOSIS — I10 Essential (primary) hypertension: Secondary | ICD-10-CM | POA: Diagnosis not present

## 2021-05-19 DIAGNOSIS — E872 Acidosis: Secondary | ICD-10-CM | POA: Diagnosis not present

## 2021-05-31 DIAGNOSIS — N184 Chronic kidney disease, stage 4 (severe): Secondary | ICD-10-CM | POA: Diagnosis not present

## 2021-05-31 DIAGNOSIS — D631 Anemia in chronic kidney disease: Secondary | ICD-10-CM | POA: Diagnosis not present

## 2021-06-08 DIAGNOSIS — M94262 Chondromalacia, left knee: Secondary | ICD-10-CM | POA: Diagnosis not present

## 2021-06-13 DIAGNOSIS — R1011 Right upper quadrant pain: Secondary | ICD-10-CM | POA: Diagnosis not present

## 2021-06-13 DIAGNOSIS — R600 Localized edema: Secondary | ICD-10-CM | POA: Diagnosis not present

## 2021-06-13 DIAGNOSIS — D7389 Other diseases of spleen: Secondary | ICD-10-CM | POA: Diagnosis not present

## 2021-06-13 DIAGNOSIS — Z940 Kidney transplant status: Secondary | ICD-10-CM | POA: Diagnosis not present

## 2021-06-13 DIAGNOSIS — K859 Acute pancreatitis without necrosis or infection, unspecified: Secondary | ICD-10-CM | POA: Diagnosis not present

## 2021-06-13 DIAGNOSIS — N3001 Acute cystitis with hematuria: Secondary | ICD-10-CM | POA: Diagnosis not present

## 2021-06-13 DIAGNOSIS — N390 Urinary tract infection, site not specified: Secondary | ICD-10-CM | POA: Diagnosis not present

## 2021-06-13 DIAGNOSIS — N261 Atrophy of kidney (terminal): Secondary | ICD-10-CM | POA: Diagnosis not present

## 2021-06-13 DIAGNOSIS — R933 Abnormal findings on diagnostic imaging of other parts of digestive tract: Secondary | ICD-10-CM | POA: Diagnosis not present

## 2021-06-14 DIAGNOSIS — D631 Anemia in chronic kidney disease: Secondary | ICD-10-CM | POA: Diagnosis not present

## 2021-06-14 DIAGNOSIS — Z01419 Encounter for gynecological examination (general) (routine) without abnormal findings: Secondary | ICD-10-CM | POA: Diagnosis not present

## 2021-06-14 DIAGNOSIS — Z86002 Personal history of in-situ neoplasm of other and unspecified genital organs: Secondary | ICD-10-CM | POA: Diagnosis not present

## 2021-06-14 DIAGNOSIS — N184 Chronic kidney disease, stage 4 (severe): Secondary | ICD-10-CM | POA: Diagnosis not present

## 2021-06-15 ENCOUNTER — Ambulatory Visit: Payer: Medicare Other | Admitting: Podiatry

## 2021-06-28 DIAGNOSIS — N184 Chronic kidney disease, stage 4 (severe): Secondary | ICD-10-CM | POA: Diagnosis not present

## 2021-06-28 DIAGNOSIS — D631 Anemia in chronic kidney disease: Secondary | ICD-10-CM | POA: Diagnosis not present

## 2021-06-29 DIAGNOSIS — D485 Neoplasm of uncertain behavior of skin: Secondary | ICD-10-CM | POA: Diagnosis not present

## 2021-06-29 DIAGNOSIS — L820 Inflamed seborrheic keratosis: Secondary | ICD-10-CM | POA: Diagnosis not present

## 2021-06-30 DIAGNOSIS — Z1231 Encounter for screening mammogram for malignant neoplasm of breast: Secondary | ICD-10-CM | POA: Diagnosis not present

## 2021-07-12 DIAGNOSIS — N184 Chronic kidney disease, stage 4 (severe): Secondary | ICD-10-CM | POA: Diagnosis not present

## 2021-07-12 DIAGNOSIS — D631 Anemia in chronic kidney disease: Secondary | ICD-10-CM | POA: Diagnosis not present

## 2021-07-17 DIAGNOSIS — K859 Acute pancreatitis without necrosis or infection, unspecified: Secondary | ICD-10-CM | POA: Diagnosis not present

## 2021-07-17 DIAGNOSIS — N390 Urinary tract infection, site not specified: Secondary | ICD-10-CM | POA: Diagnosis not present

## 2021-07-17 DIAGNOSIS — D631 Anemia in chronic kidney disease: Secondary | ICD-10-CM | POA: Diagnosis not present

## 2021-07-17 DIAGNOSIS — N189 Chronic kidney disease, unspecified: Secondary | ICD-10-CM | POA: Diagnosis not present

## 2021-07-17 DIAGNOSIS — Z940 Kidney transplant status: Secondary | ICD-10-CM | POA: Diagnosis not present

## 2021-07-17 DIAGNOSIS — E872 Acidosis, unspecified: Secondary | ICD-10-CM | POA: Diagnosis not present

## 2021-07-17 DIAGNOSIS — N2581 Secondary hyperparathyroidism of renal origin: Secondary | ICD-10-CM | POA: Diagnosis not present

## 2021-07-17 DIAGNOSIS — I10 Essential (primary) hypertension: Secondary | ICD-10-CM | POA: Diagnosis not present

## 2021-07-17 DIAGNOSIS — M549 Dorsalgia, unspecified: Secondary | ICD-10-CM | POA: Diagnosis not present

## 2021-07-17 DIAGNOSIS — Z23 Encounter for immunization: Secondary | ICD-10-CM | POA: Diagnosis not present

## 2021-07-19 ENCOUNTER — Encounter: Payer: Self-pay | Admitting: Gastroenterology

## 2021-07-19 ENCOUNTER — Other Ambulatory Visit: Payer: Self-pay

## 2021-07-19 ENCOUNTER — Ambulatory Visit (INDEPENDENT_AMBULATORY_CARE_PROVIDER_SITE_OTHER): Payer: Medicare Other | Admitting: Gastroenterology

## 2021-07-19 VITALS — BP 112/70 | HR 76 | Ht 59.0 in | Wt 166.0 lb

## 2021-07-19 DIAGNOSIS — K219 Gastro-esophageal reflux disease without esophagitis: Secondary | ICD-10-CM

## 2021-07-19 DIAGNOSIS — K449 Diaphragmatic hernia without obstruction or gangrene: Secondary | ICD-10-CM

## 2021-07-19 DIAGNOSIS — K581 Irritable bowel syndrome with constipation: Secondary | ICD-10-CM

## 2021-07-19 MED ORDER — FAMOTIDINE 20 MG PO TABS: 20.0000 mg | ORAL_TABLET | Freq: Every day | ORAL | 3 refills | Status: DC

## 2021-07-19 NOTE — Patient Instructions (Addendum)
If you are age 44 or older, your body mass index should be between 23-30. Your Body mass index is 33.53 kg/m. If this is out of the aforementioned range listed, please consider follow up with your Primary Care Provider.  If you are age 65 or younger, your body mass index should be between 19-25. Your Body mass index is 33.53 kg/m. If this is out of the aformentioned range listed, please consider follow up with your Primary Care Provider.   ________________________________________________________  The [facility] providers would like to encourage you to use [facility] to communicate with providers for non-urgent requests or questions.  Due to long hold times on the telephone, sending your provider a message by [facility] may be a faster and more efficient way to get a response.  Please allow 48 business hours for a response.  Please remember that this is for non-urgent requests.  _______________________________________________________  We have sent the following medications to your pharmacy for you to pick up at your convenience: Pepcid 20mg  daily  Please purchase the following medications over the counter and take as directed: Colace 1 tablet daily Continue fiber supplements.  Call in 3 months to schedule an office visit.  Thank you,  Dr. Jackquline Denmark

## 2021-07-19 NOTE — Progress Notes (Signed)
Chief Complaint:   Referring Provider:  [name], PA-C      ASSESSMENT AND PLAN;   #1. GERD with small HH and EE, currently off PPIs d/t nephrology recommendations.  #2. IBS-C  #3. ?Early liver cirrhosis on [place] 01/2020 (to be addressed at next visit).  Borderline platelet count recently. Had minimal ascites on [place]. Alb 2.9  #4. Ac pancreatitis. Nl LFTs, No gallstones.  Plan:  -Records - blood work from [employer]. -pepcid 49m po QD -Continue fiber supplements. -Colace 1 tab po QD -RTC 12 weeks -Further WU for ?Early cirrhosis next visit   HPI:    Ariel Hashemiis a 45y.o. 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  She was found to have UTI treated with nitrofurantoin.  She was also told that she had "pancreatitis".  We just got records.  Patient had already left the clinic.  These are summarized below  -Labs showed lipase 5672 with normal LFTs. Alb 4.2  -CT Abdo/pelvis without contrast showed normal pancreas. Persistent edematous appearance of right lower quadrant renal transplant which has been unchanged since 12/2019. -BUN/creatinine 35/2.9 with GFR 18 mL/min -CBC with hemoglobin 14.3, platelets 214K -She was given oxyIR as needed for abdominal pain.  [facility].  We do not have those records currently.  She told me that lipase was checked as well.  She has follow-up appointment with Dr. UHollie Salkthis week.  No previous history of pancreatitis.  No alcohol.  No family history of pancreatitis.  No trauma.  He does complain of constipation.    From previous notes -CT Abdo/pelvis without contrast 11/2019 which showed inflammatory area around renal transplant graft.  [facility] for abdominal bloating.   Has been seen by GYN-no GYN etiology.  Sent to GI clinic for any possible further evaluation.  -Also underwent ultrasound as below 01/2020 which showed ? early liver cirrhosis.  Wt Readings from Last 3 Encounters:  07/19/21 166 lb (75.3 kg)  04/27/20 147 lb 4 oz (66.8 kg)  03/10/20 139 lb (63 kg)      Previous GI procedures: -Colonoscopy (Dr. BMelina Copa 04/13/2016: neg except for minimal patchy erythema.  Recommend repeating colonoscopy at age 45 Bx- neg R and L colonic bx for microscopic colitis. -EGD 07/31/2017: Distal esophageal ulcers, small HH, erosive gastritis.  Given Prevacid.  Switched to Pepcid after 12 weeks d/t nephrology recommendations. Neg SB Bx for celiac, Neg gastric Bx for HP, Neg eso Bx for CMV/HSV/Candida.  [facility] with perirenal and right retroperitoneal stranding.  Findings relatively unchanged since 12/2019.  No transplant hydronephrosis. Normal pancreas.  No gallstones  -CT Abdo/pelvis without contrast 11/25/2019 1. Again noted is a markedly enlarged right lower quadrant renal graft with diffuse edema and surrounding fat stranding. 2. Increase in volume of ascites. If there is a clinical concern for peritonitis consider further evaluation with diagnostic paracentesis. 3. Increase in diffuse subcutaneous edema involving the anterior abdominal wall. 4. Bony stigmata of renal osteodystrophy.  [place] 1. Contracted gallbladder without shadowing stone or biliary dilatation 2. Question subtle contour nodularity of the liver as could be seen with early changes of cirrhosis,  correlate with LFTs. Small free fluid in the right upper quadrant adjacent to the liver 3. 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    Depression    Dialysis patient ([place])    Gastritis, acute    History of renal transplant    Hypercholesterolemia    Hypertension    Hypomagnesemia    IBS (irritable bowel syndrome)    Nephrogenic diabetes insipidus (HCC)    congenital   Renal failure, chronic    Dialysis in the past   [facility] spotted fever    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    Vulvar dystrophy     Past Surgical History:  Procedure Laterality Date   AV FISTULA PLACEMENT     COLONOSCOPY  11/28/2005   [facility]   COLONOSCOPY  04/13/2016   Dr Orlena Sheldon   ESOPHAGOGASTRODUODENOSCOPY  07/31/2017   Distal esophageal ulcers (likely due to gastroeesphageal reflix-biopsied). Small hiatal hernia. Erosive gastritis.   HEMORRHOID SURGERY     2019, and 04/29/2020   INSERTION OF DIALYSIS CATHETER     KIDNEY TRANSPLANT  1995, 2008   KNEE SURGERY Left 2020   PARATHYROIDECTOMY  2004   Portion on the parathyroid gland transplanted in R forearm   REMOVAL OF A DIALYSIS CATHETER     SIMPLE VULVECTOMY  06/19/2011   Partial simple left   SKIN CANCER EXCISION  2016   Per pt, area removed on scalp showed squamous cell carcinoma   TUBAL LIGATION      Family History  Problem Relation Age of Onset   Hypertension Mother    Hyperlipidemia Mother    Diabetes Father    Rectal cancer Maternal Grandmother    Colon cancer Maternal Grandmother    Esophageal cancer Neg Hx     Social History   Tobacco Use   Smoking status: Never   Smokeless tobacco: Never  Vaping Use   Vaping Use: Never used  Substance Use Topics   Alcohol use: No   Drug use: No    Current Outpatient Medications  Medication Sig Dispense Refill   acetaminophen (TYLENOL) 500 MG tablet Take 500 mg by mouth every 6 (six) hours as needed for headache (pain).     allopurinol (ZYLOPRIM) 100 MG tablet TAKE 1 TABLET(100 MG) BY MOUTH DAILY     ALPRAZolam  (XANAX) 0.5 MG tablet Take 0.5 mg by mouth at bedtime.   1   calcitRIOL (ROCALTROL) 0.5 MCG capsule Take 0.5 mcg by mouth 2 (two) times daily.     carvedilol (COREG) 12.5 MG tablet Take 12.5 mg by mouth 2 (two) times daily.  3   doxycycline (VIBRA-TABS) 100 MG tablet Take 100 mg by mouth 2 (two) times daily.     famotidine (PEPCID) 20 MG tablet Take 20 mg by mouth daily.     furosemide (LASIX) 40 MG tablet Take 40 mg by mouth daily as needed for fluid or edema.      hydroquinone 4 % cream Apply topically.     Magnesium 250 MG TABS Take 1 tablet by mouth daily.     medroxyPROGESTERone (PROVERA) 10 MG tablet Take 10 mg by mouth daily.  3   Menthol-Zinc  Oxide (CALMOSEPTINE) 0.44-20.6 % OINT Apply topically 2 (two) times daily.     mycophenolate (MYFORTIC) 360 MG TBEC EC tablet Take 720 mg by mouth 2 (two) times daily.      ondansetron (ZOFRAN-ODT) 4 MG disintegrating tablet Take by mouth.     Potassium Chloride ER 20 MEQ TBCR Take 20 mEq by mouth daily.  6   predniSONE (DELTASONE) 5 MG tablet Take 5 mg by mouth daily.       sertraline (ZOLOFT) 50 MG tablet Take 50 mg by mouth daily.     simvastatin (ZOCOR) 5 MG tablet Take 5 mg by mouth at bedtime.     sodium bicarbonate 650 MG tablet SMARTSIG:2 Tablet(s) By Mouth Every Evening     tacrolimus (PROGRAF) 1 MG capsule Take 1 mg by mouth 2 (two) times daily.  6   traZODone (DESYREL) 50 MG tablet Take 50 mg by mouth at bedtime.      fluticasone (FLONASE) 50 MCG/ACT nasal spray Place 2 sprays into both nostrils daily.     No current facility-administered medications for this visit.    Allergies  Allergen Reactions   Bactrim [Sulfamethoxazole-Trimethoprim] Anaphylaxis   Sulfamethoxazole Anaphylaxis   Hydrogen Peroxide Rash   Labetalol Other (See Comments)   Wound Dressing Adhesive Hives and Other (See Comments)   Zolpidem Other (See Comments)   Hyoscyamine Other (See Comments)    dizziness   Keflex [Cephalexin] Hives and Swelling    Per pt,  her face and lips swell up and she develops hives   Neosporin [Bacitracin-Polymyxin B]     Can use that for a little bit but will develop rash    Adhesive [Tape] Rash   Imuran [Azathioprine Sodium] Rash   Tramadol Rash   Warfarin Sodium Rash    Review of Systems:  neg     Physical Exam:    BP 112/70   Pulse 76   Ht 4' 11"  (1.499 m)   Wt 166 lb (75.3 kg)   SpO2 98%   BMI 33.53 kg/m  Wt Readings from Last 3 Encounters:  07/19/21 166 lb (75.3 kg)  04/27/20 147 lb 4 oz (66.8 kg)  03/10/20 139 lb (63 kg)   Constitutional:  Well-developed, in no acute distress. Psychiatric: Normal mood and affect. Behavior is normal. HEENT: Pupils normal.  Conjunctivae are normal. No scleral icterus. Neck supple.  Cardiovascular: Normal rate, regular rhythm. No edema Pulmonary/chest: Effort normal and breath sounds normal. No wheezing, rales or rhonchi. Abdominal: Soft, nondistended. Nontender. Bowel sounds active throughout. There are no masses palpable. No hepatomegaly.  Enlarged renal graft right lower quadrant with multiple scars Rectal:  defered Neurological: Alert and oriented to person place and time. Skin: Skin is warm and dry. No rashes noted.  Data Reviewed: I have personally reviewed following labs and imaging studies  CBC: CBC Latest Ref Rng & Units 03/10/2020 01/08/2020 12/10/2017  WBC 4.0 - 10.5 K/uL 9.0 7.3 6.6  Hemoglobin 12.0 - 15.0 g/dL 11.6(L) 11.7(L) 10.9(L)  Hematocrit 36.0 - 46.0 % 38.4 39.5 35.0(L)  Platelets 150 - 400 K/uL 141(L) 269 254    CMP: CMP Latest Ref Rng & Units 03/10/2020 01/08/2020 12/10/2017  Glucose 70 - 99 mg/dL 95 96 91  BUN 6 - 20 mg/dL 28(H) 10 13  Creatinine 0.44 - 1.00 mg/dL 2.80(H) 2.59(H) 1.83(H)  Sodium 135 - 145 mmol/L 135 137 138  Potassium 3.5 - 5.1 mmol/L 4.5 4.2 4.1  Chloride 98 - 111 mmol/L 107 110 109  CO2  22 - 32 mmol/L 17(L) 17(L) 20(L)  Calcium 8.9 - 10.3 mg/dL 7.1(L) 8.7(L) 8.3(L)  Total Protein 6.5 - 8.1 g/dL 5.6(L) 6.4(L) 6.2(L)   Total Bilirubin 0.3 - 1.2 mg/dL 0.8 0.6 0.4  Alkaline Phos 38 - 126 U/L 47 50 46  AST 15 - 41 U/L 15 11(L) 14(L)  ALT 0 - 44 U/L 18 8 10(L)   Hepatic Function Latest Ref Rng & Units 03/10/2020 01/08/2020 12/10/2017  Total Protein 6.5 - 8.1 g/dL 5.6(L) 6.4(L) 6.2(L)  Albumin 3.5 - 5.0 g/dL 2.9(L) 3.8 3.5  AST 15 - 41 U/L 15 11(L) 14(L)  ALT 0 - 44 U/L 18 8 10(L)  Alk Phosphatase 38 - 126 U/L 47 50 46  Total Bilirubin 0.3 - 1.2 mg/dL 0.8 0.6 0.4  Bilirubin, Direct 0.1 - 0.5 mg/dL - - -     Carmell Austria, MD 07/19/2021, 2:29 PM  Cc: [name], PA-C

## 2021-07-29 DIAGNOSIS — M79605 Pain in left leg: Secondary | ICD-10-CM | POA: Diagnosis not present

## 2021-07-31 DIAGNOSIS — N184 Chronic kidney disease, stage 4 (severe): Secondary | ICD-10-CM | POA: Diagnosis not present

## 2021-07-31 DIAGNOSIS — D631 Anemia in chronic kidney disease: Secondary | ICD-10-CM | POA: Diagnosis not present

## 2021-08-04 DIAGNOSIS — M25562 Pain in left knee: Secondary | ICD-10-CM | POA: Diagnosis not present

## 2021-08-05 DIAGNOSIS — E892 Postprocedural hypoparathyroidism: Secondary | ICD-10-CM | POA: Diagnosis present

## 2021-08-05 DIAGNOSIS — Z7969 Long term (current) use of other immunomodulators and immunosuppressants: Secondary | ICD-10-CM | POA: Diagnosis not present

## 2021-08-05 DIAGNOSIS — N12 Tubulo-interstitial nephritis, not specified as acute or chronic: Secondary | ICD-10-CM | POA: Diagnosis present

## 2021-08-05 DIAGNOSIS — N289 Disorder of kidney and ureter, unspecified: Secondary | ICD-10-CM | POA: Diagnosis not present

## 2021-08-05 DIAGNOSIS — R748 Abnormal levels of other serum enzymes: Secondary | ICD-10-CM | POA: Diagnosis not present

## 2021-08-05 DIAGNOSIS — Z87440 Personal history of urinary (tract) infections: Secondary | ICD-10-CM | POA: Diagnosis not present

## 2021-08-05 DIAGNOSIS — N10 Acute tubulo-interstitial nephritis: Secondary | ICD-10-CM | POA: Diagnosis not present

## 2021-08-05 DIAGNOSIS — N3289 Other specified disorders of bladder: Secondary | ICD-10-CM | POA: Diagnosis not present

## 2021-08-05 DIAGNOSIS — Z79899 Other long term (current) drug therapy: Secondary | ICD-10-CM | POA: Diagnosis not present

## 2021-08-05 DIAGNOSIS — Z792 Long term (current) use of antibiotics: Secondary | ICD-10-CM | POA: Diagnosis not present

## 2021-08-05 DIAGNOSIS — N184 Chronic kidney disease, stage 4 (severe): Secondary | ICD-10-CM | POA: Diagnosis present

## 2021-08-05 DIAGNOSIS — Z7952 Long term (current) use of systemic steroids: Secondary | ICD-10-CM | POA: Diagnosis not present

## 2021-08-05 DIAGNOSIS — N19 Unspecified kidney failure: Secondary | ICD-10-CM | POA: Diagnosis not present

## 2021-08-05 DIAGNOSIS — M109 Gout, unspecified: Secondary | ICD-10-CM | POA: Diagnosis present

## 2021-08-05 DIAGNOSIS — E7800 Pure hypercholesterolemia, unspecified: Secondary | ICD-10-CM | POA: Diagnosis present

## 2021-08-05 DIAGNOSIS — R0789 Other chest pain: Secondary | ICD-10-CM | POA: Diagnosis present

## 2021-08-05 DIAGNOSIS — A419 Sepsis, unspecified organism: Secondary | ICD-10-CM | POA: Diagnosis present

## 2021-08-05 DIAGNOSIS — R079 Chest pain, unspecified: Secondary | ICD-10-CM | POA: Diagnosis not present

## 2021-08-05 DIAGNOSIS — D631 Anemia in chronic kidney disease: Secondary | ICD-10-CM | POA: Diagnosis present

## 2021-08-05 DIAGNOSIS — F32A Depression, unspecified: Secondary | ICD-10-CM | POA: Diagnosis present

## 2021-08-05 DIAGNOSIS — F419 Anxiety disorder, unspecified: Secondary | ICD-10-CM | POA: Diagnosis present

## 2021-08-05 DIAGNOSIS — K219 Gastro-esophageal reflux disease without esophagitis: Secondary | ICD-10-CM | POA: Diagnosis present

## 2021-08-05 DIAGNOSIS — R002 Palpitations: Secondary | ICD-10-CM | POA: Diagnosis not present

## 2021-08-05 DIAGNOSIS — Z882 Allergy status to sulfonamides status: Secondary | ICD-10-CM | POA: Diagnosis not present

## 2021-08-05 DIAGNOSIS — E039 Hypothyroidism, unspecified: Secondary | ICD-10-CM | POA: Diagnosis present

## 2021-08-05 DIAGNOSIS — Z940 Kidney transplant status: Secondary | ICD-10-CM | POA: Diagnosis not present

## 2021-08-05 DIAGNOSIS — I129 Hypertensive chronic kidney disease with stage 1 through stage 4 chronic kidney disease, or unspecified chronic kidney disease: Secondary | ICD-10-CM | POA: Diagnosis present

## 2021-08-05 DIAGNOSIS — Z888 Allergy status to other drugs, medicaments and biological substances status: Secondary | ICD-10-CM | POA: Diagnosis not present

## 2021-08-05 DIAGNOSIS — M549 Dorsalgia, unspecified: Secondary | ICD-10-CM | POA: Diagnosis not present

## 2021-08-05 DIAGNOSIS — R531 Weakness: Secondary | ICD-10-CM | POA: Diagnosis not present

## 2021-08-05 DIAGNOSIS — K5730 Diverticulosis of large intestine without perforation or abscess without bleeding: Secondary | ICD-10-CM | POA: Diagnosis not present

## 2021-08-05 DIAGNOSIS — D849 Immunodeficiency, unspecified: Secondary | ICD-10-CM | POA: Diagnosis present

## 2021-08-05 DIAGNOSIS — T8619 Other complication of kidney transplant: Secondary | ICD-10-CM | POA: Diagnosis present

## 2021-08-07 DIAGNOSIS — R079 Chest pain, unspecified: Secondary | ICD-10-CM | POA: Diagnosis not present

## 2021-08-08 DIAGNOSIS — R079 Chest pain, unspecified: Secondary | ICD-10-CM

## 2021-08-09 ENCOUNTER — Other Ambulatory Visit: Payer: Self-pay | Admitting: *Deleted

## 2021-08-09 DIAGNOSIS — Z6833 Body mass index (BMI) 33.0-33.9, adult: Secondary | ICD-10-CM | POA: Diagnosis not present

## 2021-08-09 DIAGNOSIS — G4700 Insomnia, unspecified: Secondary | ICD-10-CM | POA: Diagnosis not present

## 2021-08-09 DIAGNOSIS — N183 Chronic kidney disease, stage 3 unspecified: Secondary | ICD-10-CM | POA: Diagnosis not present

## 2021-08-09 DIAGNOSIS — F419 Anxiety disorder, unspecified: Secondary | ICD-10-CM | POA: Diagnosis not present

## 2021-08-09 DIAGNOSIS — Z79899 Other long term (current) drug therapy: Secondary | ICD-10-CM | POA: Diagnosis not present

## 2021-08-09 DIAGNOSIS — I10 Essential (primary) hypertension: Secondary | ICD-10-CM | POA: Diagnosis not present

## 2021-08-09 DIAGNOSIS — N10 Acute tubulo-interstitial nephritis: Secondary | ICD-10-CM | POA: Diagnosis not present

## 2021-08-09 NOTE — Patient Outreach (Signed)
[facility]) Care Management  08/09/2021  Bernie [id number]   Referral Date: 12/7 Referral Source: Data Analysis Referral Reason: Recent hospital discharge Insurance: Traditional Medicare/DCE   Outreach attempt #1, successful. Identity verified.  This care manager introduced self and stated purpose of call.  [facility] care management services explained.    State she is doing well, denies needs.  Benefits of [facility] explained, she verbalizes understanding but declines engagement.  Offered to send [facility] brochure to review with a follow up call within the next 2 weeks.  Agrees to receiving brochure, declines to have follow up call.    Plan: RN CM will send outreach letter with this care manager's contact information, however will close case as member has declined involvement.  Valente David, [place], MSN [place] [phone]

## 2021-08-14 DIAGNOSIS — E872 Acidosis, unspecified: Secondary | ICD-10-CM | POA: Diagnosis not present

## 2021-08-14 DIAGNOSIS — E559 Vitamin D deficiency, unspecified: Secondary | ICD-10-CM | POA: Diagnosis not present

## 2021-08-14 DIAGNOSIS — N184 Chronic kidney disease, stage 4 (severe): Secondary | ICD-10-CM | POA: Diagnosis not present

## 2021-08-14 DIAGNOSIS — Z940 Kidney transplant status: Secondary | ICD-10-CM | POA: Diagnosis not present

## 2021-08-14 DIAGNOSIS — Z79899 Other long term (current) drug therapy: Secondary | ICD-10-CM | POA: Diagnosis not present

## 2021-08-14 DIAGNOSIS — N2581 Secondary hyperparathyroidism of renal origin: Secondary | ICD-10-CM | POA: Diagnosis not present

## 2021-08-14 DIAGNOSIS — E785 Hyperlipidemia, unspecified: Secondary | ICD-10-CM | POA: Diagnosis not present

## 2021-08-14 DIAGNOSIS — R809 Proteinuria, unspecified: Secondary | ICD-10-CM | POA: Diagnosis not present

## 2021-08-14 DIAGNOSIS — I10 Essential (primary) hypertension: Secondary | ICD-10-CM | POA: Diagnosis not present

## 2021-08-15 DIAGNOSIS — D631 Anemia in chronic kidney disease: Secondary | ICD-10-CM | POA: Diagnosis not present

## 2021-08-15 DIAGNOSIS — N184 Chronic kidney disease, stage 4 (severe): Secondary | ICD-10-CM | POA: Diagnosis not present

## 2021-08-18 DIAGNOSIS — N179 Acute kidney failure, unspecified: Secondary | ICD-10-CM

## 2021-08-18 HISTORY — DX: Acute kidney failure, unspecified: N17.9

## 2021-08-23 DIAGNOSIS — M94262 Chondromalacia, left knee: Secondary | ICD-10-CM | POA: Diagnosis not present

## 2021-08-23 DIAGNOSIS — M1712 Unilateral primary osteoarthritis, left knee: Secondary | ICD-10-CM | POA: Diagnosis not present

## 2021-08-29 DIAGNOSIS — N184 Chronic kidney disease, stage 4 (severe): Secondary | ICD-10-CM | POA: Diagnosis not present

## 2021-08-29 DIAGNOSIS — D631 Anemia in chronic kidney disease: Secondary | ICD-10-CM | POA: Diagnosis not present

## 2021-09-01 DIAGNOSIS — N189 Chronic kidney disease, unspecified: Secondary | ICD-10-CM | POA: Diagnosis not present

## 2021-09-04 DIAGNOSIS — H6121 Impacted cerumen, right ear: Secondary | ICD-10-CM | POA: Diagnosis not present

## 2021-09-04 DIAGNOSIS — R519 Headache, unspecified: Secondary | ICD-10-CM | POA: Diagnosis not present

## 2021-09-04 DIAGNOSIS — J029 Acute pharyngitis, unspecified: Secondary | ICD-10-CM | POA: Diagnosis not present

## 2021-09-06 DIAGNOSIS — N2581 Secondary hyperparathyroidism of renal origin: Secondary | ICD-10-CM | POA: Diagnosis not present

## 2021-09-06 DIAGNOSIS — N184 Chronic kidney disease, stage 4 (severe): Secondary | ICD-10-CM | POA: Diagnosis not present

## 2021-09-06 DIAGNOSIS — M109 Gout, unspecified: Secondary | ICD-10-CM | POA: Diagnosis not present

## 2021-09-06 DIAGNOSIS — E872 Acidosis, unspecified: Secondary | ICD-10-CM | POA: Diagnosis not present

## 2021-09-06 DIAGNOSIS — R809 Proteinuria, unspecified: Secondary | ICD-10-CM | POA: Diagnosis not present

## 2021-09-06 DIAGNOSIS — Z940 Kidney transplant status: Secondary | ICD-10-CM | POA: Diagnosis not present

## 2021-09-06 DIAGNOSIS — Z79899 Other long term (current) drug therapy: Secondary | ICD-10-CM | POA: Diagnosis not present

## 2021-09-06 DIAGNOSIS — E559 Vitamin D deficiency, unspecified: Secondary | ICD-10-CM | POA: Diagnosis not present

## 2021-09-06 DIAGNOSIS — E876 Hypokalemia: Secondary | ICD-10-CM | POA: Diagnosis not present

## 2021-09-06 DIAGNOSIS — I10 Essential (primary) hypertension: Secondary | ICD-10-CM | POA: Diagnosis not present

## 2021-09-15 DIAGNOSIS — J3489 Other specified disorders of nose and nasal sinuses: Secondary | ICD-10-CM | POA: Diagnosis not present

## 2021-09-15 DIAGNOSIS — J310 Chronic rhinitis: Secondary | ICD-10-CM | POA: Diagnosis not present

## 2021-09-15 DIAGNOSIS — J342 Deviated nasal septum: Secondary | ICD-10-CM | POA: Diagnosis not present

## 2021-09-15 DIAGNOSIS — J343 Hypertrophy of nasal turbinates: Secondary | ICD-10-CM | POA: Diagnosis not present

## 2021-09-15 DIAGNOSIS — J449 Chronic obstructive pulmonary disease, unspecified: Secondary | ICD-10-CM | POA: Diagnosis not present

## 2021-09-15 DIAGNOSIS — H61303 Acquired stenosis of external ear canal, unspecified, bilateral: Secondary | ICD-10-CM | POA: Diagnosis not present

## 2021-09-15 DIAGNOSIS — H6121 Impacted cerumen, right ear: Secondary | ICD-10-CM | POA: Diagnosis not present

## 2021-09-15 DIAGNOSIS — R0981 Nasal congestion: Secondary | ICD-10-CM | POA: Diagnosis not present

## 2021-09-15 DIAGNOSIS — J329 Chronic sinusitis, unspecified: Secondary | ICD-10-CM | POA: Diagnosis not present

## 2021-09-15 DIAGNOSIS — N289 Disorder of kidney and ureter, unspecified: Secondary | ICD-10-CM | POA: Diagnosis not present

## 2021-09-19 DIAGNOSIS — Z940 Kidney transplant status: Secondary | ICD-10-CM | POA: Diagnosis not present

## 2021-10-09 DIAGNOSIS — R799 Abnormal finding of blood chemistry, unspecified: Secondary | ICD-10-CM | POA: Diagnosis not present

## 2021-10-09 DIAGNOSIS — Z940 Kidney transplant status: Secondary | ICD-10-CM | POA: Diagnosis not present

## 2021-10-09 DIAGNOSIS — Z79899 Other long term (current) drug therapy: Secondary | ICD-10-CM | POA: Diagnosis not present

## 2021-10-11 ENCOUNTER — Ambulatory Visit (INDEPENDENT_AMBULATORY_CARE_PROVIDER_SITE_OTHER): Payer: Medicare Other | Admitting: Gastroenterology

## 2021-10-11 ENCOUNTER — Other Ambulatory Visit: Payer: Self-pay

## 2021-10-11 ENCOUNTER — Encounter: Payer: Self-pay | Admitting: Gastroenterology

## 2021-10-11 VITALS — BP 118/78 | HR 70 | Ht 59.0 in | Wt 169.0 lb

## 2021-10-11 DIAGNOSIS — K859 Acute pancreatitis without necrosis or infection, unspecified: Secondary | ICD-10-CM

## 2021-10-11 DIAGNOSIS — K582 Mixed irritable bowel syndrome: Secondary | ICD-10-CM

## 2021-10-11 DIAGNOSIS — K449 Diaphragmatic hernia without obstruction or gangrene: Secondary | ICD-10-CM

## 2021-10-11 DIAGNOSIS — K219 Gastro-esophageal reflux disease without esophagitis: Secondary | ICD-10-CM | POA: Diagnosis not present

## 2021-10-11 NOTE — Progress Notes (Signed)
Chief Complaint: FU  Referring Provider:  [name], PA-C      ASSESSMENT AND PLAN;   #1. GERD with small HH and EE, currently off PPIs d/t nephrology recommendations.  #2. IBS-alt D/C  #3. Ac pancreatitis. Nl LFTs, No gallstones. Resolved  Plan:  -Increase pepcid 28m po BID -Continue fiber supplements. -Obtain results of amy/lipase @ blood drawn friday -EGD/colon (if/when OK with renal transplant) -Also has appt with Dr RConstance Holster-RTC 12 weeks.   HPI:    Ariel Lahmis a 46y.o. 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  RUQ pain/heartburn/nausea- better with famotidine QD Alt diar/const Bloating better Worse with meals With nausea  Has appt with Dr RConstance Holster  CT 08/2021 no cirrhosis. IMPRESSION: Stable changes in the right iliac renal transplant with significant edematous changes and perinephric stranding.  Stable atrophy of the native kidneys and partially calcified prior left lower quadrant renal transplant.  Diverticulosis without diverticulitis.  Edematous changes in the right lateral abdominal wall the related to the underlying changes in the transplant kidney but stable in appearance from the prior exam     [facility].  She was found to have UTI treated with nitrofurantoin.  She was also told that she had "pancreatitis".  We just got records.  Patient had already left the clinic.  These are summarized below  -Labs showed lipase 5672 with normal LFTs. Alb 4.2  -CT Abdo/pelvis without contrast showed normal pancreas. Persistent edematous appearance of right lower quadrant renal transplant which has been unchanged since 12/2019. -BUN/creatinine 35/2.9 with GFR 18 mL/min -CBC with hemoglobin 14.3, platelets 214K -She  was given oxyIR as needed for abdominal pain.  [facility]on Lips.  We do not have those records currently.  She told me that lipase was checked as well.  She has follow-up appointment with Dr. UHollie Salkthis week.  No previous history of pancreatitis.  No alcohol.  No family history of pancreatitis.  No trauma.  He does complain of constipation.    From previous notes -CT Abdo/pelvis without contrast 11/2019 which showed inflammatory area around renal transplant graft.  [facility] for [facility].  Has been seen by GYN-no GYN etiology.  Sent to GI clinic for any possible further evaluation.  -Also underwent ultrasound as below 01/2020 which showed ? early liver cirrhosis.  Wt Readings from Last 3 Encounters:  10/11/21 169 lb (76.7 kg)  07/19/21 166 lb (75.3 kg)  04/27/20 147 lb 4 oz (66.8 kg)      Previous GI procedures: -Colonoscopy (Dr. BMelina Copa 04/13/2016: neg except for minimal patchy erythema.  Recommend repeating colonoscopy at age 46 Bx- neg R and L colonic bx for microscopic colitis. -EGD 07/31/2017: Distal esophageal ulcers, small HH, erosive gastritis.  Given Prevacid.  Switched to Pepcid after 12 weeks d/t nephrology recommendations. Neg SB Bx for celiac, Neg gastric Bx for HP, Neg eso Bx for CMV/HSV/Candida.  [facility] renal transplant with perirenal and right retroperitoneal stranding.  Findings relatively unchanged since 12/2019.  No transplant hydronephrosis. Normal pancreas.  No gallstones  -CT  Abdo/pelvis without contrast 11/25/2019 1. Again noted is a markedly enlarged right lower quadrant renal graft with diffuse edema and surrounding fat stranding. 2. Increase in volume of ascites. If there is a clinical concern for peritonitis consider further evaluation  with diagnostic paracentesis. 3. Increase in diffuse subcutaneous edema involving the anterior abdominal wall. 4. Bony stigmata of renal osteodystrophy.  [place] 01/08/2020 1. Contracted gallbladder without shadowing stone or biliary dilatation 2. Question subtle contour nodularity of the liver as could be seen with early changes of cirrhosis, correlate with LFTs. Small free fluid in the right upper quadrant adjacent to the liver 3. 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 Small hiatal hernia. Erosive gastritis.   HEMORRHOID SURGERY     2019, and 04/29/2020   INSERTION OF DIALYSIS CATHETER     KIDNEY TRANSPLANT  1995, 2008   KNEE SURGERY Left 2020   PARATHYROIDECTOMY  2004   Portion on the parathyroid gland transplanted in R forearm   REMOVAL OF A DIALYSIS CATHETER     SIMPLE VULVECTOMY  06/19/2011   Partial simple left   SKIN CANCER EXCISION  2016   Per pt, area removed on scalp showed squamous cell carcinoma   TUBAL LIGATION      Family History  Problem Relation Age of Onset   Hypertension  Mother    Hyperlipidemia Mother    Diabetes Father    Rectal cancer Maternal Grandmother    Colon cancer Maternal Grandmother    Esophageal cancer Neg Hx     Social History   Tobacco Use   Smoking status: Never   Smokeless tobacco: Never  Vaping Use   Vaping Use: Never used  Substance Use Topics   Alcohol use: No   Drug use: No    Current Outpatient Medications  Medication Sig Dispense Refill   acetaminophen (TYLENOL) 500 MG tablet Take 500 mg by mouth every 6 (six) hours as needed for headache (pain).     allopurinol (ZYLOPRIM) 100 MG tablet TAKE 1 TABLET(100 MG) BY MOUTH DAILY     ALPRAZolam (XANAX) 0.5 MG tablet Take 0.5 mg by mouth at bedtime.   1   calcitRIOL (ROCALTROL) 0.5 MCG capsule Take 0.5 mcg by mouth 2 (two) times daily.     carvedilol (COREG) 12.5 MG tablet Take 12.5 mg by mouth 2 (two) times daily.  3   epoetin alfa-epbx (RETACRIT) 00762 UNIT/ML injection  20,000 Units. Once monthly     famotidine (PEPCID) 20 MG tablet Take 1 tablet (20 mg total) by mouth daily. 90 tablet 3   furosemide (LASIX) 40 MG tablet Take 40 mg by mouth daily as needed for fluid or edema.      magnesium gluconate (MAGONATE) 500 MG tablet Take 500 mg by mouth 2 (two) times daily.     medroxyPROGESTERone (PROVERA) 10 MG tablet Take 10 mg by mouth daily.  3   Menthol-Zinc Oxide (CALMOSEPTINE) 0.44-20.6 % OINT Apply topically 2 (two) times daily.     mycophenolate (MYFORTIC) 360 MG TBEC EC tablet Take 720 mg by mouth 2 (two) times daily.      ondansetron (ZOFRAN-ODT) 4 MG disintegrating tablet Take by mouth.     Potassium Chloride ER 20 MEQ TBCR Take 20 mEq by mouth daily.  6   predniSONE (DELTASONE) 5 MG tablet Take 5 mg by mouth daily.       sertraline (ZOLOFT) 50 MG tablet Take 50 mg by mouth daily.     simvastatin (ZOCOR) 5 MG tablet Take 5 mg by mouth at bedtime.     sodium bicarbonate 650 MG tablet SMARTSIG:2 Tablet(s) By Mouth Every Evening     tacrolimus (PROGRAF) 1 MG capsule Take 0.5  mg by mouth 2 (two) times daily.  6   traZODone (DESYREL) 50 MG tablet Take 50 mg by mouth at bedtime.      No current facility-administered medications for this visit.    Allergies  Allergen Reactions   Bactrim [Sulfamethoxazole-Trimethoprim] Anaphylaxis   Sulfamethoxazole Anaphylaxis   Hydrogen Peroxide Rash   Labetalol Other (See Comments)   Wound Dressing Adhesive Hives and Other (See Comments)   Zolpidem Other (See Comments)   Hyoscyamine Other (See Comments)    dizziness   Keflex [Cephalexin] Hives and Swelling    Per pt, her face and lips swell up and she develops hives   Neosporin [Bacitracin-Polymyxin B]     Can use that for a little bit but will develop rash    Adhesive [Tape] Rash   Imuran [Azathioprine Sodium] Rash   Tramadol Rash   Warfarin Sodium Rash    Review of Systems:  neg     Physical Exam:    BP 118/78    Pulse 70    Ht 4' 11"  (1.499 m)    Wt 169 lb (76.7 kg)    SpO2 98%    BMI 34.13 kg/m  Wt Readings from Last 3 Encounters:  10/11/21 169 lb (76.7 kg)  07/19/21 166 lb (75.3 kg)  04/27/20 147 lb 4 oz (66.8 kg)   Constitutional:  Well-developed, in no acute distress. Psychiatric: Normal mood and affect. Behavior is normal. HEENT: Pupils normal.  Conjunctivae are normal. No scleral icterus. Neck supple.  Cardiovascular: Normal rate, regular rhythm. No edema Pulmonary/chest: Effort normal and breath sounds normal. No wheezing, rales or rhonchi. Abdominal: Soft, nondistended. Nontender. Bowel sounds active throughout. There are no masses palpable. No hepatomegaly.  Enlarged renal graft right lower quadrant with multiple scars Rectal:  defered Neurological: Alert and oriented to person place and time. Skin: Skin is warm and dry. No rashes noted.  Data Reviewed: I have personally reviewed following labs and imaging studies  CBC: CBC Latest Ref Rng & Units 03/10/2020 01/08/2020 12/10/2017  WBC 4.0 - 10.5 K/uL 9.0 7.3 6.6  Hemoglobin 12.0 - 15.0 g/dL  11.6(L) 11.7(L) 10.9(L)  Hematocrit 36.0 - 46.0 % 38.4 39.5 35.0(L)  Platelets 150 - 400  K/uL 141(L) 269 254    CMP: CMP Latest Ref Rng & Units 03/10/2020 01/08/2020 12/10/2017  Glucose 70 - 99 mg/dL 95 96 91  BUN 6 - 20 mg/dL 28(H) 10 13  Creatinine 0.44 - 1.00 mg/dL 2.80(H) 2.59(H) 1.83(H)  Sodium 135 - 145 mmol/L 135 137 138  Potassium 3.5 - 5.1 mmol/L 4.5 4.2 4.1  Chloride 98 - 111 mmol/L 107 110 109  CO2 22 - 32 mmol/L 17(L) 17(L) 20(L)  Calcium 8.9 - 10.3 mg/dL 7.1(L) 8.7(L) 8.3(L)  Total Protein 6.5 - 8.1 g/dL 5.6(L) 6.4(L) 6.2(L)  Total Bilirubin 0.3 - 1.2 mg/dL 0.8 0.6 0.4  Alkaline Phos 38 - 126 U/L 47 50 46  AST 15 - 41 U/L 15 11(L) 14(L)  ALT 0 - 44 U/L 18 8 10(L)   Hepatic Function Latest Ref Rng & Units 03/10/2020 01/08/2020 12/10/2017  Total Protein 6.5 - 8.1 g/dL 5.6(L) 6.4(L) 6.2(L)  Albumin 3.5 - 5.0 g/dL 2.9(L) 3.8 3.5  AST 15 - 41 U/L 15 11(L) 14(L)  ALT 0 - 44 U/L 18 8 10(L)  Alk Phosphatase 38 - 126 U/L 47 50 46  Total Bilirubin 0.3 - 1.2 mg/dL 0.8 0.6 0.4  Bilirubin, Direct 0.1 - 0.5 mg/dL - - -     Ariel Austria, MD 10/11/2021, 11:32 AM  Cc: [name], PA-C

## 2021-10-11 NOTE — Patient Instructions (Signed)
If you are age 45 or older, your body mass index should be between 23-30. Your Body mass index is 34.13 kg/m. If this is out of the aforementioned range listed, please consider follow up with your Primary Care Provider.  If you are age 24 or younger, your body mass index should be between 19-25. Your Body mass index is 34.13 kg/m. If this is out of the aformentioned range listed, please consider follow up with your Primary Care Provider.   __________________________________________________________  The [facility] providers would like to encourage you to use [facility] to communicate with providers for non-urgent requests or questions.  Due to long hold times on the telephone, sending your provider a message by [facility] may be a faster and more efficient way to get a response.  Please allow 48 business hours for a response.  Please remember that this is for non-urgent requests.  _____________________________________________________ -Increase pepcid 20mg  by mouth twice daily  Continue with your fiber supplements  When you have your blood drawn Friday please have an amylase and lipase  When you see the transplant team please ask for clearance for and EGD/colonoscopy .  Follow up in 12 weeks.  It was a pleasure to see you today!  Jackquline Denmark, M.D.

## 2021-10-13 DIAGNOSIS — Z01818 Encounter for other preprocedural examination: Secondary | ICD-10-CM | POA: Diagnosis not present

## 2021-10-16 DIAGNOSIS — M25511 Pain in right shoulder: Secondary | ICD-10-CM | POA: Diagnosis not present

## 2021-10-17 DIAGNOSIS — D013 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: Secondary | ICD-10-CM | POA: Diagnosis not present

## 2021-10-18 DIAGNOSIS — D631 Anemia in chronic kidney disease: Secondary | ICD-10-CM | POA: Diagnosis not present

## 2021-10-18 DIAGNOSIS — N2581 Secondary hyperparathyroidism of renal origin: Secondary | ICD-10-CM | POA: Diagnosis not present

## 2021-10-18 DIAGNOSIS — I10 Essential (primary) hypertension: Secondary | ICD-10-CM | POA: Diagnosis not present

## 2021-10-18 DIAGNOSIS — E872 Acidosis, unspecified: Secondary | ICD-10-CM | POA: Diagnosis not present

## 2021-10-18 DIAGNOSIS — D013 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: Secondary | ICD-10-CM | POA: Diagnosis not present

## 2021-10-18 DIAGNOSIS — D071 Carcinoma in situ of vulva: Secondary | ICD-10-CM | POA: Diagnosis not present

## 2021-10-18 DIAGNOSIS — Z940 Kidney transplant status: Secondary | ICD-10-CM | POA: Diagnosis not present

## 2021-10-18 DIAGNOSIS — N189 Chronic kidney disease, unspecified: Secondary | ICD-10-CM | POA: Diagnosis not present

## 2021-10-18 DIAGNOSIS — N390 Urinary tract infection, site not specified: Secondary | ICD-10-CM | POA: Diagnosis not present

## 2021-10-29 DIAGNOSIS — N184 Chronic kidney disease, stage 4 (severe): Secondary | ICD-10-CM | POA: Diagnosis not present

## 2021-10-29 DIAGNOSIS — Z7952 Long term (current) use of systemic steroids: Secondary | ICD-10-CM | POA: Diagnosis not present

## 2021-10-29 DIAGNOSIS — Z888 Allergy status to other drugs, medicaments and biological substances status: Secondary | ICD-10-CM | POA: Diagnosis not present

## 2021-10-29 DIAGNOSIS — D631 Anemia in chronic kidney disease: Secondary | ICD-10-CM | POA: Diagnosis present

## 2021-10-29 DIAGNOSIS — N3000 Acute cystitis without hematuria: Secondary | ICD-10-CM | POA: Diagnosis not present

## 2021-10-29 DIAGNOSIS — K219 Gastro-esophageal reflux disease without esophagitis: Secondary | ICD-10-CM | POA: Diagnosis not present

## 2021-10-29 DIAGNOSIS — B952 Enterococcus as the cause of diseases classified elsewhere: Secondary | ICD-10-CM | POA: Diagnosis not present

## 2021-10-29 DIAGNOSIS — F32A Depression, unspecified: Secondary | ICD-10-CM | POA: Diagnosis not present

## 2021-10-29 DIAGNOSIS — A419 Sepsis, unspecified organism: Secondary | ICD-10-CM | POA: Diagnosis not present

## 2021-10-29 DIAGNOSIS — I160 Hypertensive urgency: Secondary | ICD-10-CM | POA: Diagnosis not present

## 2021-10-29 DIAGNOSIS — N12 Tubulo-interstitial nephritis, not specified as acute or chronic: Secondary | ICD-10-CM | POA: Diagnosis not present

## 2021-10-29 DIAGNOSIS — Z940 Kidney transplant status: Secondary | ICD-10-CM | POA: Diagnosis not present

## 2021-10-29 DIAGNOSIS — Z20822 Contact with and (suspected) exposure to covid-19: Secondary | ICD-10-CM | POA: Diagnosis not present

## 2021-10-29 DIAGNOSIS — Z881 Allergy status to other antibiotic agents status: Secondary | ICD-10-CM | POA: Diagnosis not present

## 2021-10-29 DIAGNOSIS — Z792 Long term (current) use of antibiotics: Secondary | ICD-10-CM | POA: Diagnosis not present

## 2021-10-29 DIAGNOSIS — Z8701 Personal history of pneumonia (recurrent): Secondary | ICD-10-CM | POA: Diagnosis not present

## 2021-10-29 DIAGNOSIS — Z882 Allergy status to sulfonamides status: Secondary | ICD-10-CM | POA: Diagnosis not present

## 2021-10-29 DIAGNOSIS — Z87440 Personal history of urinary (tract) infections: Secondary | ICD-10-CM | POA: Diagnosis not present

## 2021-10-29 DIAGNOSIS — M109 Gout, unspecified: Secondary | ICD-10-CM | POA: Diagnosis present

## 2021-10-29 DIAGNOSIS — I10 Essential (primary) hypertension: Secondary | ICD-10-CM | POA: Diagnosis not present

## 2021-10-29 DIAGNOSIS — E7800 Pure hypercholesterolemia, unspecified: Secondary | ICD-10-CM | POA: Diagnosis not present

## 2021-10-29 DIAGNOSIS — F419 Anxiety disorder, unspecified: Secondary | ICD-10-CM | POA: Diagnosis not present

## 2021-10-29 DIAGNOSIS — Z79899 Other long term (current) drug therapy: Secondary | ICD-10-CM | POA: Diagnosis not present

## 2021-10-29 DIAGNOSIS — I129 Hypertensive chronic kidney disease with stage 1 through stage 4 chronic kidney disease, or unspecified chronic kidney disease: Secondary | ICD-10-CM | POA: Diagnosis not present

## 2021-10-29 DIAGNOSIS — R002 Palpitations: Secondary | ICD-10-CM | POA: Diagnosis not present

## 2021-10-30 DIAGNOSIS — A419 Sepsis, unspecified organism: Secondary | ICD-10-CM | POA: Diagnosis not present

## 2021-10-30 DIAGNOSIS — N3000 Acute cystitis without hematuria: Secondary | ICD-10-CM | POA: Diagnosis not present

## 2021-10-30 DIAGNOSIS — Z940 Kidney transplant status: Secondary | ICD-10-CM | POA: Diagnosis not present

## 2021-10-31 DIAGNOSIS — Z940 Kidney transplant status: Secondary | ICD-10-CM | POA: Diagnosis not present

## 2021-10-31 DIAGNOSIS — N3000 Acute cystitis without hematuria: Secondary | ICD-10-CM | POA: Diagnosis not present

## 2021-10-31 DIAGNOSIS — A419 Sepsis, unspecified organism: Secondary | ICD-10-CM | POA: Diagnosis not present

## 2021-11-03 ENCOUNTER — Other Ambulatory Visit: Payer: Self-pay

## 2021-11-03 DIAGNOSIS — C801 Malignant (primary) neoplasm, unspecified: Secondary | ICD-10-CM | POA: Insufficient documentation

## 2021-11-03 DIAGNOSIS — A630 Anogenital (venereal) warts: Secondary | ICD-10-CM | POA: Insufficient documentation

## 2021-11-03 DIAGNOSIS — E876 Hypokalemia: Secondary | ICD-10-CM | POA: Insufficient documentation

## 2021-11-03 DIAGNOSIS — F32A Depression, unspecified: Secondary | ICD-10-CM | POA: Insufficient documentation

## 2021-11-03 DIAGNOSIS — R600 Localized edema: Secondary | ICD-10-CM | POA: Insufficient documentation

## 2021-11-03 DIAGNOSIS — N390 Urinary tract infection, site not specified: Secondary | ICD-10-CM | POA: Insufficient documentation

## 2021-11-03 DIAGNOSIS — M109 Gout, unspecified: Secondary | ICD-10-CM | POA: Insufficient documentation

## 2021-11-03 DIAGNOSIS — K859 Acute pancreatitis without necrosis or infection, unspecified: Secondary | ICD-10-CM | POA: Insufficient documentation

## 2021-11-03 DIAGNOSIS — N251 Nephrogenic diabetes insipidus: Secondary | ICD-10-CM | POA: Insufficient documentation

## 2021-11-03 DIAGNOSIS — K589 Irritable bowel syndrome without diarrhea: Secondary | ICD-10-CM | POA: Insufficient documentation

## 2021-11-03 DIAGNOSIS — R002 Palpitations: Secondary | ICD-10-CM | POA: Insufficient documentation

## 2021-11-03 DIAGNOSIS — N189 Chronic kidney disease, unspecified: Secondary | ICD-10-CM

## 2021-11-03 DIAGNOSIS — Z940 Kidney transplant status: Secondary | ICD-10-CM | POA: Insufficient documentation

## 2021-11-03 DIAGNOSIS — N2581 Secondary hyperparathyroidism of renal origin: Secondary | ICD-10-CM | POA: Insufficient documentation

## 2021-11-03 DIAGNOSIS — R5383 Other fatigue: Secondary | ICD-10-CM | POA: Insufficient documentation

## 2021-11-03 DIAGNOSIS — C4442 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: Secondary | ICD-10-CM | POA: Insufficient documentation

## 2021-11-03 DIAGNOSIS — Z992 Dependence on renal dialysis: Secondary | ICD-10-CM | POA: Insufficient documentation

## 2021-11-03 DIAGNOSIS — E872 Acidosis, unspecified: Secondary | ICD-10-CM | POA: Insufficient documentation

## 2021-11-03 DIAGNOSIS — A770 Spotted fever due to Rickettsia rickettsii: Secondary | ICD-10-CM | POA: Insufficient documentation

## 2021-11-03 DIAGNOSIS — D0730 Carcinoma in situ of unspecified female genital organs: Secondary | ICD-10-CM | POA: Insufficient documentation

## 2021-11-03 DIAGNOSIS — M549 Dorsalgia, unspecified: Secondary | ICD-10-CM | POA: Insufficient documentation

## 2021-11-03 DIAGNOSIS — R300 Dysuria: Secondary | ICD-10-CM | POA: Insufficient documentation

## 2021-11-03 DIAGNOSIS — K2900 Acute gastritis without bleeding: Secondary | ICD-10-CM | POA: Insufficient documentation

## 2021-11-03 DIAGNOSIS — E7800 Pure hypercholesterolemia, unspecified: Secondary | ICD-10-CM | POA: Insufficient documentation

## 2021-11-03 DIAGNOSIS — N904 Leukoplakia of vulva: Secondary | ICD-10-CM | POA: Insufficient documentation

## 2021-11-03 DIAGNOSIS — I10 Essential (primary) hypertension: Secondary | ICD-10-CM

## 2021-11-03 DIAGNOSIS — N185 Chronic kidney disease, stage 5: Secondary | ICD-10-CM | POA: Insufficient documentation

## 2021-11-03 DIAGNOSIS — N186 End stage renal disease: Secondary | ICD-10-CM | POA: Insufficient documentation

## 2021-11-03 HISTORY — DX: Chronic kidney disease, stage 5: N18.5

## 2021-11-03 HISTORY — DX: Chronic kidney disease, unspecified: N18.9

## 2021-11-03 HISTORY — DX: Essential (primary) hypertension: I10

## 2021-11-06 ENCOUNTER — Encounter: Payer: Self-pay | Admitting: Cardiology

## 2021-11-06 ENCOUNTER — Ambulatory Visit (INDEPENDENT_AMBULATORY_CARE_PROVIDER_SITE_OTHER): Payer: Medicare Other | Admitting: Cardiology

## 2021-11-06 ENCOUNTER — Other Ambulatory Visit: Payer: Self-pay

## 2021-11-06 VITALS — BP 130/88 | HR 75 | Ht 59.0 in | Wt 170.6 lb

## 2021-11-06 DIAGNOSIS — I10 Essential (primary) hypertension: Secondary | ICD-10-CM | POA: Diagnosis not present

## 2021-11-06 DIAGNOSIS — Z79899 Other long term (current) drug therapy: Secondary | ICD-10-CM | POA: Diagnosis not present

## 2021-11-06 DIAGNOSIS — Z6834 Body mass index (BMI) 34.0-34.9, adult: Secondary | ICD-10-CM | POA: Diagnosis not present

## 2021-11-06 DIAGNOSIS — F419 Anxiety disorder, unspecified: Secondary | ICD-10-CM | POA: Diagnosis not present

## 2021-11-06 DIAGNOSIS — Z940 Kidney transplant status: Secondary | ICD-10-CM | POA: Diagnosis not present

## 2021-11-06 DIAGNOSIS — T8612 Kidney transplant failure: Secondary | ICD-10-CM

## 2021-11-06 DIAGNOSIS — G4700 Insomnia, unspecified: Secondary | ICD-10-CM | POA: Diagnosis not present

## 2021-11-06 DIAGNOSIS — E0590 Thyrotoxicosis, unspecified without thyrotoxic crisis or storm: Secondary | ICD-10-CM | POA: Diagnosis not present

## 2021-11-06 DIAGNOSIS — E7800 Pure hypercholesterolemia, unspecified: Secondary | ICD-10-CM | POA: Diagnosis not present

## 2021-11-06 DIAGNOSIS — N390 Urinary tract infection, site not specified: Secondary | ICD-10-CM | POA: Diagnosis not present

## 2021-11-06 DIAGNOSIS — R7989 Other specified abnormal findings of blood chemistry: Secondary | ICD-10-CM | POA: Diagnosis not present

## 2021-11-06 DIAGNOSIS — N184 Chronic kidney disease, stage 4 (severe): Secondary | ICD-10-CM | POA: Diagnosis not present

## 2021-11-06 MED ORDER — ISOSORBIDE MONONITRATE ER 30 MG PO TB24: 30.0000 mg | ORAL_TABLET | Freq: Every morning | ORAL | 3 refills | Status: DC

## 2021-11-06 NOTE — Patient Instructions (Signed)
Medication Instructions:  ?Your physician has recommended you make the following change in your medication:  ? ?Increase your Imdur to 30 mg daily.  ? ?*If you need a refill on your cardiac medications before your next appointment, please call your pharmacy* ? ? ?Lab Work: ?None ordered ?If you have labs (blood work) drawn today and your tests are completely normal, you will receive your results only by: ?MyChart Message (if you have MyChart) OR ?A paper copy in the mail ?If you have any lab test that is abnormal or we need to change your treatment, we will call you to review the results. ? ? ?Testing/Procedures: ?None ordered ? ? ?Follow-Up: ?At [facility], you and your health needs are our priority.  As part of our continuing mission to provide you with exceptional heart care, we have created designated Provider Care Teams.  These Care Teams include your primary Cardiologist (physician) and Advanced Practice Providers (APPs -  Physician Assistants and Nurse Practitioners) who all work together to provide you with the care you need, when you need it. ? ?We recommend signing up for the patient portal called "MyChart".  Sign up information is provided on this After Visit Summary.  MyChart is used to connect with patients for Virtual Visits (Telemedicine).  Patients are able to view lab/test results, encounter notes, upcoming appointments, etc.  Non-urgent messages can be sent to your provider as well.   ?To learn more about what you can do with MyChart, go to [email or web address].   ? ?Your next appointment:   ?6 month(s) ? ?The format for your next appointment:   ?In Person ? ?Provider:   ?Jyl Heinz, MD ? ? ?Other Instructions ? ?Blood Pressure Record Sheet ?To take your blood pressure, you will need a blood pressure machine. You can buy a blood pressure machine (blood pressure monitor) at your clinic, drug store, or online. When choosing one, consider: ?An automatic monitor that has an arm cuff. ?A cuff  that wraps snugly around your upper arm. You should be able to fit only one finger between your arm and the cuff. ?A device that stores blood pressure reading results. ?Do not choose a monitor that measures your blood pressure from your wrist or finger. ?Follow your health care provider's instructions for how to take your blood pressure. To use this form: ?Get one reading in the morning (a.m.) 1-2 hours after you take any medicines. ?Get one reading in the evening (p.m.) before supper. ?Take at least 2 readings with each blood pressure check. This makes sure the results are correct. Wait 1-2 minutes between measurements. ?Write down the results in the spaces on this form. ?Repeat this once a week, or as told by your health care provider. ? ?Make a follow-up appointment with your health care provider to discuss the results. ?Blood pressure log ?Date: _______________________ ?a.m. _____________________(1st reading) HR___________ ? ?          p.m. _____________________(2nd reading) HR__________ ? ?Date: _______________________ ?a.m. _____________________(1st reading) HR___________ ? ?          p.m. _____________________(2nd reading) HR__________ ?Date: _______________________ ?a.m. _____________________(1st reading) HR___________ ? ?          p.m. _____________________(2nd reading) HR__________ ?Date: _______________________ ?a.m. _____________________(1st reading) HR___________ ? ?          p.m. _____________________(2nd reading) HR__________ ? ?Date: _______________________ ?a.m. _____________________(1st reading) HR___________ ? ?          p.m. _____________________(2nd reading) HR__________ ? ?Date: _______________________ ?a.m. _____________________(1st reading) HR___________ ? ?  p.m. _____________________(2nd reading) HR__________ ? ?Date: _______________________ ?a.m. _____________________(1st reading) HR___________ ? ?          p.m. _____________________(2nd reading) HR__________ ? ? ?This information  is not intended to replace advice given to you by your health care provider. Make sure you discuss any questions you have with your health care provider. ?Document Revised: 12/09/2019 Document Reviewed: 12/09/2019 ?Elsevier Patient Education ? [place].  ?

## 2021-11-06 NOTE — Progress Notes (Signed)
Cardiology Office Note:    Date:  11/06/2021   ID:  [place], DOB [birth year], MRN [id number]  PCP:  [name], PA-C  Cardiologist:  Jenean Lindau, MD   Referring MD: [name], PA-C    ASSESSMENT:    1. Essential hypertension with goal blood pressure less than 130/80   2. Hypercholesterolemia   3. History of renal transplant   4. Failed kidney transplant    PLAN:    In order of problems listed above:  Primary prevention stressed to the patient.  Importance of compliance with diet and medication stressed and she vocalized understanding.  She was advised to walk on a regular basis to the best of her ability. I reviewed [place] hospital evaluation in the past few months.  Echocardiogram and stress test report were reviewed and discussed with the patient at length. Essential hypertension: Blood pressure is elevated.  I would double her isosorbide from 15 mg daily to 30 mg daily.  She will keep a track of her blood pressures and come back in a week to 2 weeks for a nurse visit here.  She will bring her blood pressure machine from home also. End-stage renal disease, post to renal transplant and now on the list for the third transplant according to the patient.  This is managed by her nephrology team. Mixed dyslipidemia and obesity: Diet emphasized.  Lifestyle modification urged and she promises to do better.  This is followed by primary care.Patient will be seen in follow-up appointment in 6 months or earlier if the patient has any concerns    Medication Adjustments/Labs and Tests Ordered: Current medicines are reviewed at length with the patient today.  Concerns regarding medicines are outlined above.  No orders of the defined types were placed in this encounter.  No orders of the defined types were placed in this encounter.    History of Present Illness:    Ariel [name] is a 46 y.o. female who is being seen today for the evaluation of essential  hypertension at the request of [place], PA-C.  Patient is a pleasant 46 year old female.  She has past medical history of essential hypertension, dyslipidemia.  She has had renal transplant x2 and has been told that the current kidney also is failing.  She is on the list for renal transplant and therefore she is here for evaluation.  She denies any chest pain orthopnea or PND.  At the time of my evaluation, the patient is alert awake oriented and in no distress.  She mentions to me that her blood pressure runs high at home.  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   AKI (acute kidney injury) ([place]) 08/18/2021   Anemia    Anemia associated with chronic renal failure    Anorectal disorder 06/08/2020   Anorectal pain 06/08/2020   Anxiety    Back pain    Bloating 04/16/2018   Cancer ([place])    pre cervical   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    of the [place]   Chronic kidney disease    Complications due to cardiac device, implant, and graft 09/02/2012   Condyloma of female genitalia    Dehydration 01/19/2019   Depression    Dialysis patient [facility])    Drug-induced bleeding disorder ([place]) 01/19/2019   DVT of axillary vein, acute left ([place]) 08/02/2012   Dysmenorrhea  10/19/2015   Dyspnea on exertion 01/19/2019   Dysuria    End stage renal disease ([place]) 09/02/2012   ESRD (end stage renal disease) ([place])    Essential hypertension with goal blood pressure less than 130/80    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   Fatigue    Gastritis, acute    Gout    History of parathyroidectomy ([place]) 10/29/2018   History of renal transplant    HTN (hypertension) 01/19/2019   Hypercalcemia    Hypercholesterolemia    Hyperlipidemia    Hypokalemia    Hypomagnesemia    Hypothyroid 01/19/2019   IBS (irritable bowel syndrome)    Immunosuppressive management encounter following kidney transplant 10/29/2018    Internal hemorrhoid 04/22/2018   Iron deficiency anemia 10/19/2015   Irregular uterine bleeding 02/13/2016   Kidney disorder 06/08/2020   Leg edema    Mechanical complication of other vascular device, implant, and graft 42/59/5638   Metabolic acidosis    Murmur 01/19/2019   Nausea and vomiting 01/19/2019   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   Other chronic sinusitis 10/29/2018   Palpitations    Pancreatitis    Perianal lesion 04/22/2018   Phlebitis and thrombophlebitis of other sites 08/02/2012   Plantar fasciitis 07/03/2018   Pruritus ani 06/26/2018   Renal failure, chronic    Dialysis in the past   Right lower quadrant abdominal pain 10/29/2018   [facility] spotted fever    S/p cadaver renal transplant    Secondary hyperparathyroidism of renal origin [facility])    Secondary hypertension due to renal disease 10/29/2018   Sesamoiditis 07/03/2018   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    Transplanted organ removal status 10/29/2018   Unstable angina ([place]) 01/19/2019   Urinary tract infection    Vulvar dystrophy    Yeast dermatitis 08/14/2018    Past Surgical History:  Procedure Laterality Date   AV FISTULA PLACEMENT     COLONOSCOPY  11/28/2005   [facility]   COLONOSCOPY  04/13/2016   Dr Orlena Sheldon   ESOPHAGOGASTRODUODENOSCOPY  07/31/2017   Distal esophageal ulcers (likely due to gastroeesphageal reflix-biopsied). Small hiatal hernia. Erosive gastritis.   HEMORRHOID SURGERY     2019, and 04/29/2020   INSERTION OF DIALYSIS CATHETER     KIDNEY TRANSPLANT  1995, 2008   KNEE SURGERY Left 2020   PARATHYROIDECTOMY  2004   Portion on the parathyroid gland transplanted in R forearm   REMOVAL OF A DIALYSIS CATHETER     SIMPLE VULVECTOMY  06/19/2011   Partial simple left   SKIN CANCER EXCISION  2016   Per pt, area removed on scalp showed squamous cell carcinoma   TUBAL LIGATION      Current Medications: Current Meds  Medication Sig   acetaminophen  (TYLENOL) 500 MG tablet Take 500 mg by mouth every 6 (six) hours as needed for headache (pain).   allopurinol (ZYLOPRIM) 100 MG tablet TAKE 1 TABLET(100 MG) BY MOUTH DAILY   ALPRAZolam (XANAX) 0.5 MG tablet Take 0.5 mg by mouth at bedtime.    calcitRIOL (ROCALTROL) 0.5 MCG capsule Take 0.5 mcg by mouth 2 (two) times daily.   carvedilol (COREG) 25 MG tablet Take 25 mg by mouth 2 (two) times daily.   famotidine (PEPCID) 20 MG tablet Take 1 tablet (20 mg total) by mouth daily.   furosemide (LASIX) 40 MG tablet Take 40 mg by mouth daily as needed for fluid or edema.    hydrOXYzine (ATARAX) 25  MG tablet Take 25-50 mg by mouth 2 (two) times daily as needed for dizziness.   isosorbide mononitrate (IMDUR) 30 MG 24 hr tablet Take 15 mg by mouth every morning.   magnesium gluconate (MAGONATE) 500 MG tablet Take 500 mg by mouth 2 (two) times daily.   medroxyPROGESTERone (PROVERA) 10 MG tablet Take 10 mg by mouth daily.   Menthol-Zinc Oxide (CALMOSEPTINE) 0.44-20.6 % OINT Apply 1 application topically 2 (two) times daily.   mycophenolate (MYFORTIC) 360 MG TBEC EC tablet Take 720 mg by mouth 2 (two) times daily.    ondansetron (ZOFRAN-ODT) 4 MG disintegrating tablet Take 4 mg by mouth as needed for nausea/vomiting.   Potassium Chloride ER 20 MEQ TBCR Take 20 mEq by mouth daily.   predniSONE (DELTASONE) 5 MG tablet Take 5 mg by mouth daily.     sertraline (ZOLOFT) 50 MG tablet Take 50 mg by mouth daily.   simvastatin (ZOCOR) 5 MG tablet Take 5 mg by mouth at bedtime.   sodium bicarbonate 650 MG tablet Take 2 tablets by mouth every evening.   tacrolimus (PROGRAF) 0.5 MG capsule Take 0.5 mg by mouth 2 (two) times daily.   tacrolimus (PROGRAF) 1 MG capsule Take 1 mg by mouth 2 (two) times daily.   traZODone (DESYREL) 100 MG tablet Take 100 mg by mouth at bedtime as needed for sleep.     Allergies:   Bactrim [sulfamethoxazole-trimethoprim], Sulfamethoxazole, Hydrogen peroxide, Labetalol, Wound dressing  adhesive, Wound dressings, Zolpidem, Amlodipine, Hyoscyamine, Keflex [cephalexin], Neosporin [bacitracin-polymyxin b], Adhesive [tape], Imuran [azathioprine sodium], Tramadol, and Warfarin sodium   Social History   Socioeconomic History   Marital status: Divorced    Spouse name: Not on file   Number of children: Not on file   Years of education: Not on file   Highest education level: Not on file  Occupational History   Not on file  Tobacco Use   Smoking status: Never   Smokeless tobacco: Never  Vaping Use   Vaping Use: Never used  Substance and Sexual Activity   Alcohol use: No   Drug use: No   Sexual activity: Not on file  Other Topics Concern   Not on file  Social History Narrative   Not on file   Social Determinants of Health   Financial Resource Strain: Not on file  Food Insecurity: Not on file  Transportation Needs: Not on file  Physical Activity: Not on file  Stress: Not on file  Social Connections: Not on file     Family History: The patient's family history includes Colon cancer in her maternal grandmother; Diabetes in her father; Hyperlipidemia in her mother; Hypertension in her mother; Rectal cancer in her maternal grandmother. There is no history of Esophageal cancer.  ROS:   Please see the history of present illness.    All other systems reviewed and are negative.  EKGs/Labs/Other Studies Reviewed:    The following studies were reviewed today: EKG was sinus rhythm and nonspecific ST-T changes   Recent Labs: No results found for requested labs within last 8760 hours.  Recent Lipid Panel No results found for: CHOL, TRIG, HDL, CHOLHDL, VLDL, LDLCALC, LDLDIRECT  Physical Exam:    VS:  BP 130/88    Pulse 75    Ht '4\' 11"'$  (1.499 m)    Wt 170 lb 9.6 oz (77.4 kg)    SpO2 99%    BMI 34.46 kg/m     Wt Readings from Last 3 Encounters:  11/06/21 170 lb 9.6  oz (77.4 kg)  10/11/21 169 lb (76.7 kg)  07/19/21 166 lb (75.3 kg)     GEN: Patient is in no acute  distress HEENT: Normal NECK: No JVD; No carotid bruits LYMPHATICS: No lymphadenopathy CARDIAC: S1 S2 regular, 2/6 systolic murmur at the apex. RESPIRATORY:  Clear to auscultation without rales, wheezing or rhonchi  ABDOMEN: Soft, non-tender, non-distended MUSCULOSKELETAL:  No edema; No deformity  SKIN: Warm and dry NEUROLOGIC:  Alert and oriented x 3 PSYCHIATRIC:  Normal affect    Signed, Jenean Lindau, MD  11/06/2021 11:04 AM    [place]

## 2021-11-16 ENCOUNTER — Emergency Department (HOSPITAL_COMMUNITY): Payer: Medicare Other

## 2021-11-16 ENCOUNTER — Other Ambulatory Visit: Payer: Self-pay

## 2021-11-16 ENCOUNTER — Encounter (HOSPITAL_COMMUNITY): Payer: Self-pay | Admitting: *Deleted

## 2021-11-16 ENCOUNTER — Emergency Department (HOSPITAL_COMMUNITY)
Admission: EM | Admit: 2021-11-16 | Discharge: 2021-11-16 | Disposition: A | Discharge status: Home / Self Care | Payer: Medicare Other | Attending: Emergency Medicine | Admitting: Emergency Medicine

## 2021-11-16 DIAGNOSIS — Z79899 Other long term (current) drug therapy: Secondary | ICD-10-CM | POA: Diagnosis not present

## 2021-11-16 DIAGNOSIS — N281 Cyst of kidney, acquired: Secondary | ICD-10-CM | POA: Diagnosis not present

## 2021-11-16 DIAGNOSIS — R109 Unspecified abdominal pain: Secondary | ICD-10-CM | POA: Diagnosis present

## 2021-11-16 DIAGNOSIS — I10 Essential (primary) hypertension: Secondary | ICD-10-CM | POA: Diagnosis not present

## 2021-11-16 DIAGNOSIS — K5730 Diverticulosis of large intestine without perforation or abscess without bleeding: Secondary | ICD-10-CM | POA: Diagnosis not present

## 2021-11-16 DIAGNOSIS — N3001 Acute cystitis with hematuria: Secondary | ICD-10-CM | POA: Diagnosis not present

## 2021-11-16 DIAGNOSIS — R519 Headache, unspecified: Secondary | ICD-10-CM | POA: Diagnosis not present

## 2021-11-16 LAB — URINALYSIS, ROUTINE W REFLEX MICROSCOPIC
Bilirubin Urine: NEGATIVE
Glucose, UA: NEGATIVE mg/dL
Ketones, ur: NEGATIVE mg/dL
Nitrite: NEGATIVE
Protein, ur: 100 mg/dL — AB
Specific Gravity, Urine: 1.012 (ref 1.005–1.030)
pH: 6 (ref 5.0–8.0)

## 2021-11-16 LAB — CBC WITH DIFFERENTIAL/PLATELET
Abs Immature Granulocytes: 0.07 10*3/uL (ref 0.00–0.07)
Basophils Absolute: 0 10*3/uL (ref 0.0–0.1)
Basophils Relative: 0 %
Eosinophils Absolute: 0.1 10*3/uL (ref 0.0–0.5)
Eosinophils Relative: 1 %
HCT: 36 % (ref 36.0–46.0)
Hemoglobin: 11.9 g/dL — ABNORMAL LOW (ref 12.0–15.0)
Immature Granulocytes: 1 %
Lymphocytes Relative: 7 %
Lymphs Abs: 0.8 10*3/uL (ref 0.7–4.0)
MCH: 30.9 pg (ref 26.0–34.0)
MCHC: 33.1 g/dL (ref 30.0–36.0)
MCV: 93.5 fL (ref 80.0–100.0)
Monocytes Absolute: 0.8 10*3/uL (ref 0.1–1.0)
Monocytes Relative: 7 %
Neutro Abs: 10.7 10*3/uL — ABNORMAL HIGH (ref 1.7–7.7)
Neutrophils Relative %: 84 %
Platelets: 169 10*3/uL (ref 150–400)
RBC: 3.85 MIL/uL — ABNORMAL LOW (ref 3.87–5.11)
RDW: 14.5 % (ref 11.5–15.5)
WBC: 12.5 10*3/uL — ABNORMAL HIGH (ref 4.0–10.5)
nRBC: 0 % (ref 0.0–0.2)

## 2021-11-16 LAB — COMPREHENSIVE METABOLIC PANEL
ALT: 14 U/L (ref 0–44)
AST: 19 U/L (ref 15–41)
Albumin: 3.3 g/dL — ABNORMAL LOW (ref 3.5–5.0)
Alkaline Phosphatase: 38 U/L (ref 38–126)
Anion gap: 15 (ref 5–15)
BUN: 50 mg/dL — ABNORMAL HIGH (ref 6–20)
CO2: 21 mmol/L — ABNORMAL LOW (ref 22–32)
Calcium: 10.5 mg/dL — ABNORMAL HIGH (ref 8.9–10.3)
Chloride: 100 mmol/L (ref 98–111)
Creatinine, Ser: 3.82 mg/dL — ABNORMAL HIGH (ref 0.44–1.00)
GFR, Estimated: 14 mL/min — ABNORMAL LOW (ref >60)
Glucose, Bld: 107 mg/dL — ABNORMAL HIGH (ref 70–99)
Potassium: 4.4 mmol/L (ref 3.5–5.1)
Sodium: 136 mmol/L (ref 135–145)
Total Bilirubin: 0.7 mg/dL (ref 0.3–1.2)
Total Protein: 5.8 g/dL — ABNORMAL LOW (ref 6.5–8.1)

## 2021-11-16 LAB — LIPASE, BLOOD: Lipase: 357 U/L — ABNORMAL HIGH (ref 11–51)

## 2021-11-16 MED ORDER — AMOXICILLIN-POT CLAVULANATE 500-125 MG PO TABS: 1.0000 | ORAL_TABLET | Freq: Two times a day (BID) | ORAL | 0 refills | Status: AC

## 2021-11-16 MED ORDER — OXYCODONE-ACETAMINOPHEN 5-325 MG PO TABS
1.0000 | ORAL_TABLET | Freq: Once | ORAL | Status: AC
  Administered 2021-11-16: 1 via ORAL
  Filled 2021-11-16: qty 1

## 2021-11-16 NOTE — Discharge Instructions (Addendum)
It was a pleasure taking care of you today! ? ?Your labs were overall unremarkable.  You did have an elevated lipase today however your CT scan did not show any acute abnormalities today.  Your urine was notable for a UTI. You will be sent a prescription for Augmentin, take as prescribed. Ensure that you complete the entire course of antibiotic. Ensure to maintain fluid intake.  You may follow-up with your primary care provider as needed. It is important that you call your nephrologist regarding todays ED visit. Return to the emergency department if you are experiencing increasing or worsening abdominal pain, urinary symptoms, fever, or decreased fluid intake. ?

## 2021-11-16 NOTE — ED Triage Notes (Signed)
Patient presents to ed c/o problems with blood pressure for several months, states she has had 2 renal transplants and is currently on the list for another transplant now.  C/o right flank and back pain , states her legs are also hurting her.  ?

## 2021-11-16 NOTE — ED Provider Notes (Signed)
?[place] [facility] ?Provider Note ? ? ?CSN: [id number] ?Arrival date & time: 11/16/21  1045 ? ?  ? ?History ? ?Chief Complaint  ?Patient presents with  ? Abdominal Pain  ? ? ?Ariel [name] is a 46 y.o. female who presents to the Emergency Department complaining of right-sided abdominal pain onset yesterday. Has associated right sided abdominal pain, nausea (this is chronic per patient), headache (onset last night and bilateral).  Has tried Tylenol with no relief for her symptoms. Patient denies chest pain, shortness of breath, vomiting.  Denies injury, trauma, fall. ? ?Patient notes that she is concerned for her blood pressure and notes that it has been a little "out of whack" for several months.  She was evaluated at her nephrologist office.  Her blood pressures were normal and she is currently in the process of tracking her blood pressures for 2 weeks to present back to her cardiologist.  She is compliant with her medications. Blood pressure while in the room with patient at 148/87.  Patient notes that she has had 2 prior renal transplants, her left kidney was transplanted in 1995.  Her right kidney was transplanted in 2008.  Patient still has her right transplant kidney.  The left transplant kidney was removed in 2000 due to pain. ? ? ?The history is provided by the patient. No language interpreter was used.  ? ?  ? ?Home Medications ?Prior to Admission medications   ?Medication Sig Start Date End Date Taking? Authorizing Provider  ?amoxicillin-clavulanate (AUGMENTIN) 500-125 MG tablet Take 1 tablet (500 mg total) by mouth 2 (two) times daily for 7 days. 11/16/21 11/23/21 Yes Kimiyo Carmicheal A, PA-C  ?acetaminophen (TYLENOL) 500 MG tablet Take 500 mg by mouth every 6 (six) hours as needed for headache (pain).    [provider]  ?allopurinol (ZYLOPRIM) 100 MG tablet TAKE 1 TABLET(100 MG) BY MOUTH DAILY 10/13/20   [provider]  ?ALPRAZolam Duanne Moron) 0.5 MG  tablet Take 0.5 mg by mouth at bedtime.  05/31/15   [provider]  ?calcitRIOL (ROCALTROL) 0.5 MCG capsule Take 0.5 mcg by mouth 2 (two) times daily.    [provider]  ?carvedilol (COREG) 25 MG tablet Take 25 mg by mouth 2 (two) times daily. 10/26/21   [provider]  ?famotidine (PEPCID) 20 MG tablet Take 1 tablet (20 mg total) by mouth daily. 07/19/21   Jackquline Denmark, MD  ?furosemide (LASIX) 40 MG tablet Take 40 mg by mouth daily as needed for fluid or edema.     [provider]  ?hydrOXYzine (ATARAX) 25 MG tablet Take 25-50 mg by mouth 2 (two) times daily as needed for dizziness. 10/25/21   [provider]  ?isosorbide mononitrate (IMDUR) 30 MG 24 hr tablet Take 1 tablet (30 mg total) by mouth every morning. 11/06/21   Revankar, Reita Cliche, MD  ?magnesium gluconate (MAGONATE) 500 MG tablet Take 500 mg by mouth 2 (two) times daily.    [provider]  ?medroxyPROGESTERone (PROVERA) 10 MG tablet Take 10 mg by mouth daily. 04/09/17   [provider]  ?Menthol-Zinc Oxide (CALMOSEPTINE) 0.44-20.6 % OINT Apply 1 application topically 2 (two) times daily. 05/27/20   [provider]  ?mycophenolate (MYFORTIC) 360 MG TBEC EC tablet Take 720 mg by mouth 2 (two) times daily.  02/24/19   [provider]  ?ondansetron (ZOFRAN-ODT) 4 MG disintegrating tablet Take 4 mg by mouth as needed for nausea/vomiting. 03/25/18   [provider]  ?  Potassium Chloride ER 20 MEQ TBCR Take 20 mEq by mouth daily. 09/07/17   [provider]  ?predniSONE (DELTASONE) 5 MG tablet Take 5 mg by mouth daily.      [provider]  ?sertraline (ZOLOFT) 50 MG tablet Take 50 mg by mouth daily.    [provider]  ?simvastatin (ZOCOR) 5 MG tablet Take 5 mg by mouth at bedtime. 12/07/19   [provider]  ?sodium bicarbonate 650 MG tablet Take 2 tablets by mouth every evening. 12/14/20   [provider]  ?tacrolimus (PROGRAF) 0.5 MG  capsule Take 0.5 mg by mouth 2 (two) times daily.    [provider]  ?tacrolimus (PROGRAF) 1 MG capsule Take 1 mg by mouth 2 (two) times daily. 03/20/17   [provider]  ?traZODone (DESYREL) 100 MG tablet Take 100 mg by mouth at bedtime as needed for sleep. 10/05/21   [provider]  ?Colchicine 0.6 MG CAPS Take 1 capsule by mouth daily. 01/24/20 02/28/20  [provider]  ?   ? ?Allergies    ?Bactrim [sulfamethoxazole-trimethoprim], Sulfamethoxazole, Hydrogen peroxide, Labetalol, Wound dressing adhesive, Zolpidem, Amlodipine, Hyoscyamine, Keflex [cephalexin], Adhesive [tape], Imuran [azathioprine sodium], Neosporin [bacitracin-polymyxin b], Tramadol, and Warfarin sodium   ? ?Review of Systems   ?Review of Systems  ?Eyes:  Negative for visual disturbance.  ?Respiratory:  Negative for shortness of breath.   ?Cardiovascular:  Negative for chest pain.  ?Gastrointestinal:  Positive for abdominal pain and nausea. Negative for vomiting.  ?Neurological:  Positive for headaches.  ?All other systems reviewed and are negative. ? ?Physical Exam ?Updated Vital Signs ?BP (!) 151/101 (BP Location: Right Arm)   Pulse 91   Temp 98.4 ?F (36.9 ?C) (Oral)   Resp 17   Ht '4\' 11"'$  (1.499 m)   Wt 77.1 kg   SpO2 97%   BMI 34.34 kg/m?  ?Physical Exam ?Vitals and nursing note reviewed.  ?Constitutional:   ?   General: She is not in acute distress. ?   Appearance: She is not diaphoretic.  ?HENT:  ?   Head: Normocephalic and atraumatic.  ?   Mouth/Throat:  ?   Pharynx: No oropharyngeal exudate.  ?Eyes:  ?   General: No scleral icterus. ?   Conjunctiva/sclera: Conjunctivae normal.  ?Cardiovascular:  ?   Rate and Rhythm: Normal rate and regular rhythm.  ?   Pulses: Normal pulses.  ?   Heart sounds: Normal heart sounds.  ?Pulmonary:  ?   Effort: Pulmonary effort is normal. No respiratory distress.  ?   Breath sounds: Normal breath sounds. No wheezing.  ?Abdominal:  ?   General: Bowel sounds are normal.  ?    Palpations: Abdomen is soft. There is no mass.  ?   Tenderness: There is abdominal tenderness in the right upper quadrant and right lower quadrant. There is right CVA tenderness. There is no left CVA tenderness, guarding or rebound.  ?   Comments: Right CVA tenderness to palpation.  Right-sided abdominal tenderness to palpation without overlying skin changes.  ?Musculoskeletal:     ?   General: Normal range of motion.  ?   Cervical back: Normal range of motion and neck supple.  ?   Comments: Strength and sensation intact to bilateral upper and lower extremities.  ?Skin: ?   General: Skin is warm and dry.  ?Neurological:  ?   Mental Status: She is alert.  ?Psychiatric:     ?   Behavior: Behavior normal.  ? ? ?  ED Results / Procedures / Treatments   ?Labs ?(all labs ordered are listed, but only abnormal results are displayed) ?Labs Reviewed  ?CBC WITH DIFFERENTIAL/PLATELET - Abnormal; Notable for the following components:  ?    Result Value  ? WBC 12.5 (*)   ? RBC 3.85 (*)   ? Hemoglobin 11.9 (*)   ? Neutro Abs 10.7 (*)   ? All other components within normal limits  ?COMPREHENSIVE METABOLIC PANEL - Abnormal; Notable for the following components:  ? CO2 21 (*)   ? Glucose, Bld 107 (*)   ? BUN 50 (*)   ? Creatinine, Ser 3.82 (*)   ? Calcium 10.5 (*)   ? Total Protein 5.8 (*)   ? Albumin 3.3 (*)   ? GFR, Estimated 14 (*)   ? All other components within normal limits  ?LIPASE, BLOOD - Abnormal; Notable for the following components:  ? Lipase 357 (*)   ? All other components within normal limits  ?URINALYSIS, ROUTINE W REFLEX MICROSCOPIC - Abnormal; Notable for the following components:  ? APPearance CLOUDY (*)   ? Hgb urine dipstick MODERATE (*)   ? Protein, ur 100 (*)   ? Leukocytes,Ua MODERATE (*)   ? Bacteria, UA MANY (*)   ? All other components within normal limits  ?URINE CULTURE  ? ? ?EKG ?None ? ?Radiology ?CT ABDOMEN PELVIS WO CONTRAST ? ?Result Date: 11/16/2021 ?CLINICAL DATA:  Right lower quadrant abdominal  pain, history of renal allograft EXAM: CT ABDOMEN AND PELVIS WITHOUT CONTRAST TECHNIQUE: Multidetector CT imaging of the abdomen and pelvis was performed following the standard protocol without IV contrast. RADIATION

## 2021-11-17 ENCOUNTER — Telehealth: Payer: Self-pay

## 2021-11-17 DIAGNOSIS — K859 Acute pancreatitis without necrosis or infection, unspecified: Secondary | ICD-10-CM

## 2021-11-17 DIAGNOSIS — K582 Mixed irritable bowel syndrome: Secondary | ICD-10-CM

## 2021-11-17 DIAGNOSIS — K449 Diaphragmatic hernia without obstruction or gangrene: Secondary | ICD-10-CM

## 2021-11-17 DIAGNOSIS — K2100 Gastro-esophageal reflux disease with esophagitis, without bleeding: Secondary | ICD-10-CM

## 2021-11-17 NOTE — Telephone Encounter (Signed)
LVM for patient to call back.  ? ?Clearance from [facility] came back stating that patient may proceed with EGD/colon. There are no renal contraindications. ?

## 2021-11-19 DIAGNOSIS — R102 Pelvic and perineal pain: Secondary | ICD-10-CM | POA: Diagnosis not present

## 2021-11-19 DIAGNOSIS — N390 Urinary tract infection, site not specified: Secondary | ICD-10-CM | POA: Diagnosis not present

## 2021-11-19 DIAGNOSIS — N2889 Other specified disorders of kidney and ureter: Secondary | ICD-10-CM | POA: Diagnosis not present

## 2021-11-19 DIAGNOSIS — Z940 Kidney transplant status: Secondary | ICD-10-CM | POA: Diagnosis not present

## 2021-11-19 DIAGNOSIS — N261 Atrophy of kidney (terminal): Secondary | ICD-10-CM | POA: Diagnosis not present

## 2021-11-19 DIAGNOSIS — K5730 Diverticulosis of large intestine without perforation or abscess without bleeding: Secondary | ICD-10-CM | POA: Diagnosis not present

## 2021-11-19 DIAGNOSIS — T8612 Kidney transplant failure: Secondary | ICD-10-CM | POA: Diagnosis not present

## 2021-11-19 LAB — URINE CULTURE: Culture: 60000 — AB

## 2021-11-20 ENCOUNTER — Other Ambulatory Visit: Payer: Self-pay

## 2021-11-20 ENCOUNTER — Ambulatory Visit: Payer: Medicare Other

## 2021-11-20 ENCOUNTER — Telehealth: Payer: Self-pay | Admitting: Emergency Medicine

## 2021-11-20 VITALS — BP 138/98 | HR 78 | Resp 18 | Ht 59.0 in | Wt 174.0 lb

## 2021-11-20 DIAGNOSIS — I10 Essential (primary) hypertension: Secondary | ICD-10-CM

## 2021-11-20 NOTE — Progress Notes (Signed)
? ?  Nurse Visit  ?  ?Date of Encounter: 11/20/2021 ?ID: Ariel [name], DOB [id number], MRN [id number] ? ?PCP:  [name], PA-C ?  ?Cardiologist:  Revankar ?Advanced Practice Provider:  No care team member to display ?Electrophysiologist:  None  ?   ? ? ?Visit Details  ? ?VS:  BP (!) 138/98 (BP Location: Right Arm, Patient Position: Sitting, Cuff Size: Normal)   Pulse 78   Resp 18   Ht '4\' 11"'$  (1.499 m)   Wt 174 lb (78.9 kg)   SpO2 99%   BMI 35.14 kg/m?  , BMI Body mass index is 35.14 kg/m?. ? ?Wt Readings from Last 3 Encounters:  ?11/20/21 174 lb (78.9 kg)  ?11/16/21 170 lb (77.1 kg)  ?11/06/21 170 lb 9.6 oz (77.4 kg)  ?  ? ?Reason for visit: Vital signs ?Performed today: Vitals, Provider consulted: , and Education ?Changes (medications, testing, etc.) : Dr. Geraldo Pitter will review and make recommendations. ?Length of Visit: 5 minutes ? ?Medications Adjustments/Labs and Tests Ordered: ?No orders of the defined types were placed in this encounter. ? ?No orders of the defined types were placed in this encounter. ? ? ?Signed, ?Truddie Hidden, RN  ?11/20/2021 3:45 PM ? ? ? ? ?

## 2021-11-20 NOTE — Telephone Encounter (Signed)
Post ED Visit - Positive Culture Follow-up: Successful Patient Follow-Up ? ?Culture assessed and recommendations reviewed by: ? ?'[]'$  Elenor Quinones, Pharm.D. ?'[]'$  Heide Guile, Pharm.D., BCPS AQ-ID ?'[]'$  Parks Neptune, Pharm.D., BCPS ?'[]'$  Alycia Rossetti, Pharm.D., BCPS ?'[]'$  [place], Pharm.D., BCPS, AAHIVP ?'[]'$  Legrand Como, Pharm.D., BCPS, AAHIVP ?'[]'$  Salome Arnt, PharmD, BCPS ?'[]'$  Johnnette Gourd, PharmD, BCPS ?'[]'$  Hughes Better, PharmD, BCPS ?'[]'$  Leeroy Cha, PharmD ? ?Positive urine culture ? ?'[]'$  Patient discharged without antimicrobial prescription and treatment is now indicated ?'[]'$  Organism is resistant to prescribed ED discharge antimicrobial ?'[]'$  Patient with positive blood cultures ? ?Changes discussed with ED provider: Lance Muss  ?New antibiotic prescription verify patient is taking the lower dose of augmentin as prescribed Amoxicillin-pot Clavulanate(Augmentin) 500-'125mg'$  every 12 hours as prescribed, if has Augmentin 875-125 needs to stop taking that dose based on renal function ? ?Attempting to contact patient  ? ? ?Ariel [name] ?11/20/2021, 10:28 AM ? ?  ?

## 2021-11-20 NOTE — Telephone Encounter (Signed)
LVM ? ?Letter sent as well ?

## 2021-11-22 DIAGNOSIS — M1712 Unilateral primary osteoarthritis, left knee: Secondary | ICD-10-CM | POA: Diagnosis not present

## 2021-11-24 MED ORDER — NA SULFATE-K SULFATE-MG SULF 17.5-3.13-1.6 GM/177ML PO SOLN: 1.0000 | Freq: Once | ORAL | 0 refills | Status: AC

## 2021-11-24 NOTE — Addendum Note (Signed)
Addended by: Curlene Labrum E on: 11/24/2021 11:49 AM ? ? Modules accepted: Orders ? ?

## 2021-11-24 NOTE — Addendum Note (Signed)
Addended by: Curlene Labrum E on: 11/24/2021 02:00 PM ? ? Modules accepted: Orders ? ?

## 2021-11-24 NOTE — Telephone Encounter (Signed)
Patient choose 5-11. Suprep was sent in and instruction mailed and patient is to call in 2 week or by April 12th with any questions. She voiced understanding. ?

## 2021-11-24 NOTE — Telephone Encounter (Signed)
Patient is returning your call.  

## 2021-11-28 DIAGNOSIS — H61303 Acquired stenosis of external ear canal, unspecified, bilateral: Secondary | ICD-10-CM | POA: Diagnosis not present

## 2021-11-28 DIAGNOSIS — H9190 Unspecified hearing loss, unspecified ear: Secondary | ICD-10-CM | POA: Diagnosis not present

## 2021-11-28 DIAGNOSIS — J342 Deviated nasal septum: Secondary | ICD-10-CM | POA: Diagnosis not present

## 2021-11-28 DIAGNOSIS — H6123 Impacted cerumen, bilateral: Secondary | ICD-10-CM | POA: Diagnosis not present

## 2021-11-28 NOTE — Telephone Encounter (Signed)
LVM for patient to call to back to see if she did pick up her prep ?

## 2021-11-29 DIAGNOSIS — M1712 Unilateral primary osteoarthritis, left knee: Secondary | ICD-10-CM | POA: Diagnosis not present

## 2021-11-30 NOTE — Telephone Encounter (Signed)
Patient returned your call, stated she has her prep ?

## 2021-12-01 DIAGNOSIS — Z940 Kidney transplant status: Secondary | ICD-10-CM | POA: Diagnosis not present

## 2021-12-01 DIAGNOSIS — N390 Urinary tract infection, site not specified: Secondary | ICD-10-CM | POA: Diagnosis not present

## 2021-12-06 ENCOUNTER — Ambulatory Visit: Payer: Medicare Other | Admitting: Cardiology

## 2021-12-06 DIAGNOSIS — M1712 Unilateral primary osteoarthritis, left knee: Secondary | ICD-10-CM | POA: Diagnosis not present

## 2021-12-13 DIAGNOSIS — M25511 Pain in right shoulder: Secondary | ICD-10-CM | POA: Diagnosis not present

## 2021-12-13 DIAGNOSIS — M5030 Other cervical disc degeneration, unspecified cervical region: Secondary | ICD-10-CM | POA: Diagnosis not present

## 2021-12-15 DIAGNOSIS — A630 Anogenital (venereal) warts: Secondary | ICD-10-CM | POA: Diagnosis not present

## 2021-12-16 DIAGNOSIS — M25511 Pain in right shoulder: Secondary | ICD-10-CM | POA: Diagnosis not present

## 2021-12-20 ENCOUNTER — Inpatient Hospital Stay (HOSPITAL_COMMUNITY)
Admission: EM | Admit: 2021-12-20 | Discharge: 2021-12-22 | DRG: 698 | Disposition: A | Discharge status: Home / Self Care | Payer: Medicare Other | Attending: Internal Medicine | Admitting: Internal Medicine

## 2021-12-20 ENCOUNTER — Other Ambulatory Visit: Payer: Self-pay

## 2021-12-20 ENCOUNTER — Encounter (HOSPITAL_COMMUNITY): Payer: Self-pay

## 2021-12-20 DIAGNOSIS — Z8249 Family history of ischemic heart disease and other diseases of the circulatory system: Secondary | ICD-10-CM

## 2021-12-20 DIAGNOSIS — Z9079 Acquired absence of other genital organ(s): Secondary | ICD-10-CM | POA: Diagnosis not present

## 2021-12-20 DIAGNOSIS — R9431 Abnormal electrocardiogram [ECG] [EKG]: Secondary | ICD-10-CM | POA: Diagnosis not present

## 2021-12-20 DIAGNOSIS — Z833 Family history of diabetes mellitus: Secondary | ICD-10-CM

## 2021-12-20 DIAGNOSIS — M109 Gout, unspecified: Secondary | ICD-10-CM | POA: Diagnosis present

## 2021-12-20 DIAGNOSIS — E782 Mixed hyperlipidemia: Secondary | ICD-10-CM | POA: Diagnosis not present

## 2021-12-20 DIAGNOSIS — N2581 Secondary hyperparathyroidism of renal origin: Secondary | ICD-10-CM | POA: Diagnosis present

## 2021-12-20 DIAGNOSIS — Z7682 Awaiting organ transplant status: Secondary | ICD-10-CM

## 2021-12-20 DIAGNOSIS — N281 Cyst of kidney, acquired: Secondary | ICD-10-CM | POA: Diagnosis not present

## 2021-12-20 DIAGNOSIS — E860 Dehydration: Secondary | ICD-10-CM | POA: Diagnosis present

## 2021-12-20 DIAGNOSIS — Z6834 Body mass index (BMI) 34.0-34.9, adult: Secondary | ICD-10-CM | POA: Diagnosis not present

## 2021-12-20 DIAGNOSIS — I5032 Chronic diastolic (congestive) heart failure: Secondary | ICD-10-CM | POA: Diagnosis present

## 2021-12-20 DIAGNOSIS — Z8672 Personal history of thrombophlebitis: Secondary | ICD-10-CM

## 2021-12-20 DIAGNOSIS — D631 Anemia in chronic kidney disease: Secondary | ICD-10-CM | POA: Diagnosis present

## 2021-12-20 DIAGNOSIS — Z86718 Personal history of other venous thrombosis and embolism: Secondary | ICD-10-CM | POA: Diagnosis not present

## 2021-12-20 DIAGNOSIS — Z86008 Personal history of in-situ neoplasm of other site: Secondary | ICD-10-CM

## 2021-12-20 DIAGNOSIS — Z8719 Personal history of other diseases of the digestive system: Secondary | ICD-10-CM

## 2021-12-20 DIAGNOSIS — T8612 Kidney transplant failure: Secondary | ICD-10-CM | POA: Diagnosis not present

## 2021-12-20 DIAGNOSIS — K219 Gastro-esophageal reflux disease without esophagitis: Secondary | ICD-10-CM | POA: Diagnosis present

## 2021-12-20 DIAGNOSIS — N179 Acute kidney failure, unspecified: Secondary | ICD-10-CM | POA: Diagnosis not present

## 2021-12-20 DIAGNOSIS — Z85828 Personal history of other malignant neoplasm of skin: Secondary | ICD-10-CM

## 2021-12-20 DIAGNOSIS — Z940 Kidney transplant status: Secondary | ICD-10-CM | POA: Diagnosis not present

## 2021-12-20 DIAGNOSIS — Z8349 Family history of other endocrine, nutritional and metabolic diseases: Secondary | ICD-10-CM

## 2021-12-20 DIAGNOSIS — F419 Anxiety disorder, unspecified: Secondary | ICD-10-CM | POA: Diagnosis not present

## 2021-12-20 DIAGNOSIS — T8619 Other complication of kidney transplant: Principal | ICD-10-CM | POA: Diagnosis present

## 2021-12-20 DIAGNOSIS — E872 Acidosis, unspecified: Secondary | ICD-10-CM | POA: Diagnosis present

## 2021-12-20 DIAGNOSIS — N185 Chronic kidney disease, stage 5: Secondary | ICD-10-CM | POA: Diagnosis not present

## 2021-12-20 DIAGNOSIS — I10 Essential (primary) hypertension: Secondary | ICD-10-CM | POA: Diagnosis not present

## 2021-12-20 DIAGNOSIS — Z7969 Long term (current) use of other immunomodulators and immunosuppressants: Secondary | ICD-10-CM

## 2021-12-20 DIAGNOSIS — D013 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: Secondary | ICD-10-CM | POA: Diagnosis not present

## 2021-12-20 DIAGNOSIS — E669 Obesity, unspecified: Secondary | ICD-10-CM | POA: Diagnosis present

## 2021-12-20 DIAGNOSIS — F32A Depression, unspecified: Secondary | ICD-10-CM | POA: Diagnosis present

## 2021-12-20 DIAGNOSIS — E039 Hypothyroidism, unspecified: Secondary | ICD-10-CM | POA: Diagnosis present

## 2021-12-20 DIAGNOSIS — I120 Hypertensive chronic kidney disease with stage 5 chronic kidney disease or end stage renal disease: Secondary | ICD-10-CM | POA: Diagnosis not present

## 2021-12-20 DIAGNOSIS — E66811 Obesity, class 1: Secondary | ICD-10-CM

## 2021-12-20 DIAGNOSIS — E869 Volume depletion, unspecified: Secondary | ICD-10-CM | POA: Diagnosis not present

## 2021-12-20 DIAGNOSIS — Y830 Surgical operation with transplant of whole organ as the cause of abnormal reaction of the patient, or of later complication, without mention of misadventure at the time of the procedure: Secondary | ICD-10-CM | POA: Diagnosis present

## 2021-12-20 DIAGNOSIS — D071 Carcinoma in situ of vulva: Secondary | ICD-10-CM | POA: Diagnosis not present

## 2021-12-20 DIAGNOSIS — Z79899 Other long term (current) drug therapy: Secondary | ICD-10-CM

## 2021-12-20 DIAGNOSIS — N186 End stage renal disease: Secondary | ICD-10-CM | POA: Diagnosis present

## 2021-12-20 DIAGNOSIS — Z800 Family history of malignant neoplasm of digestive organs: Secondary | ICD-10-CM

## 2021-12-20 DIAGNOSIS — Z882 Allergy status to sulfonamides status: Secondary | ICD-10-CM

## 2021-12-20 DIAGNOSIS — Z793 Long term (current) use of hormonal contraceptives: Secondary | ICD-10-CM

## 2021-12-20 DIAGNOSIS — D649 Anemia, unspecified: Secondary | ICD-10-CM | POA: Diagnosis not present

## 2021-12-20 DIAGNOSIS — Z888 Allergy status to other drugs, medicaments and biological substances status: Secondary | ICD-10-CM

## 2021-12-20 DIAGNOSIS — Z881 Allergy status to other antibiotic agents status: Secondary | ICD-10-CM

## 2021-12-20 DIAGNOSIS — Z7952 Long term (current) use of systemic steroids: Secondary | ICD-10-CM

## 2021-12-20 HISTORY — DX: Gastro-esophageal reflux disease without esophagitis: K21.9

## 2021-12-20 HISTORY — DX: Other disorders of phosphorus metabolism: E83.39

## 2021-12-20 LAB — CBC
HCT: 38.6 % (ref 36.0–46.0)
Hemoglobin: 12.1 g/dL (ref 12.0–15.0)
MCH: 29.5 pg (ref 26.0–34.0)
MCHC: 31.3 g/dL (ref 30.0–36.0)
MCV: 94.1 fL (ref 80.0–100.0)
Platelets: 245 K/uL (ref 150–400)
RBC: 4.1 MIL/uL (ref 3.87–5.11)
RDW: 13.2 % (ref 11.5–15.5)
WBC: 9.2 K/uL (ref 4.0–10.5)
nRBC: 0 % (ref 0.0–0.2)

## 2021-12-20 LAB — URINALYSIS, ROUTINE W REFLEX MICROSCOPIC
Bilirubin Urine: NEGATIVE
Glucose, UA: NEGATIVE mg/dL
Ketones, ur: NEGATIVE mg/dL
Nitrite: NEGATIVE
Protein, ur: 30 mg/dL — AB
Specific Gravity, Urine: 1.004 — ABNORMAL LOW (ref 1.005–1.030)
pH: 7 (ref 5.0–8.0)

## 2021-12-20 LAB — COMPREHENSIVE METABOLIC PANEL WITH GFR
ALT: 9 U/L (ref 0–44)
AST: 13 U/L — ABNORMAL LOW (ref 15–41)
Albumin: 3.9 g/dL (ref 3.5–5.0)
Alkaline Phosphatase: 41 U/L (ref 38–126)
Anion gap: 10 (ref 5–15)
BUN: 35 mg/dL — ABNORMAL HIGH (ref 6–20)
CO2: 21 mmol/L — ABNORMAL LOW (ref 22–32)
Calcium: 11.8 mg/dL — ABNORMAL HIGH (ref 8.9–10.3)
Chloride: 102 mmol/L (ref 98–111)
Creatinine, Ser: 4.54 mg/dL — ABNORMAL HIGH (ref 0.44–1.00)
GFR, Estimated: 12 mL/min — ABNORMAL LOW
Glucose, Bld: 110 mg/dL — ABNORMAL HIGH (ref 70–99)
Potassium: 4.3 mmol/L (ref 3.5–5.1)
Sodium: 133 mmol/L — ABNORMAL LOW (ref 135–145)
Total Bilirubin: 0.8 mg/dL (ref 0.3–1.2)
Total Protein: 6.8 g/dL (ref 6.5–8.1)

## 2021-12-20 LAB — I-STAT BETA HCG BLOOD, ED (MC, WL, AP ONLY): I-stat hCG, quantitative: <5 m[IU]/mL

## 2021-12-20 LAB — SODIUM, URINE, RANDOM: Sodium, Ur: 12 mmol/L

## 2021-12-20 LAB — PHOSPHORUS: Phosphorus: 7.5 mg/dL — ABNORMAL HIGH (ref 2.5–4.6)

## 2021-12-20 LAB — CREATININE, URINE, RANDOM: Creatinine, Urine: 48.38 mg/dL

## 2021-12-20 LAB — CBG MONITORING, ED: Glucose-Capillary: 116 mg/dL — ABNORMAL HIGH (ref 70–99)

## 2021-12-20 MED ORDER — SERTRALINE HCL 50 MG PO TABS
50.0000 mg | ORAL_TABLET | Freq: Every day | ORAL | Status: DC
  Administered 2021-12-21 – 2021-12-22 (×2): 50 mg via ORAL
  Filled 2021-12-20 (×2): qty 1

## 2021-12-20 MED ORDER — HYDRALAZINE HCL 20 MG/ML IJ SOLN
10.0000 mg | Freq: Four times a day (QID) | INTRAMUSCULAR | Status: DC | PRN
Start: 2021-12-20 — End: 2021-12-22
  Administered 2021-12-22: 10 mg via INTRAVENOUS
  Filled 2021-12-20: qty 1

## 2021-12-20 MED ORDER — ONDANSETRON HCL 4 MG PO TABS: 4.0000 mg | ORAL_TABLET | Freq: Four times a day (QID) | ORAL | Status: DC | PRN

## 2021-12-20 MED ORDER — ACETAMINOPHEN 325 MG PO TABS
650.0000 mg | ORAL_TABLET | Freq: Four times a day (QID) | ORAL | Status: DC | PRN
  Administered 2021-12-21 – 2021-12-22 (×4): 650 mg via ORAL
  Filled 2021-12-20 (×4): qty 2

## 2021-12-20 MED ORDER — CINACALCET HCL 30 MG PO TABS
30.0000 mg | ORAL_TABLET | Freq: Every day | ORAL | Status: DC
  Administered 2021-12-20 – 2021-12-21 (×2): 30 mg via ORAL
  Filled 2021-12-20 (×3): qty 1

## 2021-12-20 MED ORDER — TACROLIMUS 1 MG PO CAPS
1.0000 mg | ORAL_CAPSULE | Freq: Two times a day (BID) | ORAL | Status: DC
  Administered 2021-12-20 – 2021-12-22 (×4): 1 mg via ORAL
  Filled 2021-12-20 (×4): qty 1

## 2021-12-20 MED ORDER — ACETAMINOPHEN 650 MG RE SUPP: 650.0000 mg | Freq: Four times a day (QID) | RECTAL | Status: DC | PRN

## 2021-12-20 MED ORDER — ALPRAZOLAM 0.5 MG PO TABS
0.5000 mg | ORAL_TABLET | Freq: Every day | ORAL | Status: DC
  Administered 2021-12-20 – 2021-12-21 (×2): 0.5 mg via ORAL
  Filled 2021-12-20 (×2): qty 1

## 2021-12-20 MED ORDER — SEVELAMER CARBONATE 800 MG PO TABS
1200.0000 mg | ORAL_TABLET | Freq: Three times a day (TID) | ORAL | Status: DC
  Administered 2021-12-20 – 2021-12-22 (×5): 1200 mg via ORAL
  Filled 2021-12-20 (×7): qty 1.5

## 2021-12-20 MED ORDER — POLYETHYLENE GLYCOL 3350 17 G PO PACK: 17.0000 g | PACK | Freq: Every day | ORAL | Status: DC | PRN

## 2021-12-20 MED ORDER — MEDROXYPROGESTERONE ACETATE 10 MG PO TABS
10.0000 mg | ORAL_TABLET | Freq: Every day | ORAL | Status: DC
  Administered 2021-12-21 – 2021-12-22 (×2): 10 mg via ORAL
  Filled 2021-12-20 (×2): qty 1

## 2021-12-20 MED ORDER — FAMOTIDINE 20 MG PO TABS
10.0000 mg | ORAL_TABLET | ORAL | Status: DC
  Administered 2021-12-22: 10 mg via ORAL
  Filled 2021-12-20: qty 1

## 2021-12-20 MED ORDER — ALLOPURINOL 100 MG PO TABS
50.0000 mg | ORAL_TABLET | ORAL | Status: DC
  Administered 2021-12-22: 50 mg via ORAL
  Filled 2021-12-20: qty 1

## 2021-12-20 MED ORDER — MAGNESIUM GLUCONATE 500 MG PO TABS
500.0000 mg | ORAL_TABLET | Freq: Every evening | ORAL | Status: DC
  Administered 2021-12-21: 500 mg via ORAL
  Filled 2021-12-20 (×2): qty 1

## 2021-12-20 MED ORDER — HYDROXYZINE HCL 25 MG PO TABS: 25.0000 mg | ORAL_TABLET | Freq: Two times a day (BID) | ORAL | Status: DC | PRN

## 2021-12-20 MED ORDER — CARVEDILOL 25 MG PO TABS
25.0000 mg | ORAL_TABLET | Freq: Two times a day (BID) | ORAL | Status: DC
  Administered 2021-12-21 – 2021-12-22 (×3): 25 mg via ORAL
  Filled 2021-12-20 (×3): qty 1

## 2021-12-20 MED ORDER — DIPHENHYDRAMINE HCL 50 MG/ML IJ SOLN
12.5000 mg | Freq: Once | INTRAMUSCULAR | Status: AC
  Administered 2021-12-20: 12.5 mg via INTRAVENOUS
  Filled 2021-12-20: qty 1

## 2021-12-20 MED ORDER — SODIUM BICARBONATE 650 MG PO TABS
1300.0000 mg | ORAL_TABLET | Freq: Every evening | ORAL | Status: DC
  Administered 2021-12-21: 1300 mg via ORAL
  Filled 2021-12-20: qty 2

## 2021-12-20 MED ORDER — HEPARIN SODIUM (PORCINE) 5000 UNIT/ML IJ SOLN
5000.0000 [IU] | Freq: Three times a day (TID) | INTRAMUSCULAR | Status: DC
  Administered 2021-12-21 – 2021-12-22 (×4): 5000 [IU] via SUBCUTANEOUS
  Filled 2021-12-20 (×4): qty 1

## 2021-12-20 MED ORDER — SODIUM CHLORIDE 0.9 % IV BOLUS
1000.0000 mL | Freq: Once | INTRAVENOUS | Status: AC
  Administered 2021-12-20: 1000 mL via INTRAVENOUS

## 2021-12-20 MED ORDER — ONDANSETRON HCL 4 MG/2ML IJ SOLN: 4.0000 mg | Freq: Four times a day (QID) | INTRAMUSCULAR | Status: DC | PRN

## 2021-12-20 MED ORDER — ISOSORBIDE MONONITRATE ER 30 MG PO TB24
30.0000 mg | ORAL_TABLET | Freq: Every morning | ORAL | Status: DC
  Administered 2021-12-21 – 2021-12-22 (×2): 30 mg via ORAL
  Filled 2021-12-20 (×2): qty 1

## 2021-12-20 MED ORDER — TACROLIMUS 0.5 MG PO CAPS
0.5000 mg | ORAL_CAPSULE | Freq: Two times a day (BID) | ORAL | Status: DC
  Administered 2021-12-20 – 2021-12-22 (×4): 0.5 mg via ORAL
  Filled 2021-12-20 (×4): qty 1

## 2021-12-20 MED ORDER — MYCOPHENOLATE SODIUM 180 MG PO TBEC
360.0000 mg | DELAYED_RELEASE_TABLET | Freq: Two times a day (BID) | ORAL | Status: DC
  Administered 2021-12-20 – 2021-12-22 (×4): 360 mg via ORAL
  Filled 2021-12-20 (×4): qty 2

## 2021-12-20 MED ORDER — TRAZODONE HCL 50 MG PO TABS
100.0000 mg | ORAL_TABLET | Freq: Every day | ORAL | Status: DC
  Administered 2021-12-20 – 2021-12-21 (×2): 100 mg via ORAL
  Filled 2021-12-20 (×2): qty 2

## 2021-12-20 MED ORDER — PREDNISONE 5 MG PO TABS
5.0000 mg | ORAL_TABLET | Freq: Every day | ORAL | Status: DC
  Administered 2021-12-21 – 2021-12-22 (×2): 5 mg via ORAL
  Filled 2021-12-20 (×2): qty 1

## 2021-12-20 MED ORDER — SIMVASTATIN 10 MG PO TABS
5.0000 mg | ORAL_TABLET | Freq: Every day | ORAL | Status: DC
  Administered 2021-12-20 – 2021-12-21 (×2): 5 mg via ORAL
  Filled 2021-12-20 (×2): qty 1

## 2021-12-20 MED ORDER — METOCLOPRAMIDE HCL 5 MG/ML IJ SOLN
10.0000 mg | Freq: Once | INTRAMUSCULAR | Status: AC
Start: 2021-12-20 — End: 2021-12-20
  Administered 2021-12-20: 10 mg via INTRAVENOUS
  Filled 2021-12-20: qty 2

## 2021-12-20 MED ORDER — SODIUM CHLORIDE 0.9 % IV SOLN: INTRAVENOUS | Status: AC

## 2021-12-20 NOTE — Assessment & Plan Note (Signed)
?   Continue home regimen of Xanax ?? Continue home regimen of as needed hydroxyzine ?

## 2021-12-20 NOTE — H&P (Addendum)
?History and Physical  ? ? ?Patient: Ariel [name] MRN: [id number] DOA: 12/20/2021 ? ?Date of Service: the patient was seen and examined on 12/20/2021 ? ?Patient coming from: Home ? ?Chief Complaint:  ?Chief Complaint  ?Patient presents with  ? Abnormal Labs  ? ? ?HPI:  ? 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  Patient is currently following with Dr. Hollie Salk with [facility] and is now also being seen by [facility], last seen 10/2021.   ? ?Of note, patient's creatinine was 3.82 on 3/16 with a GFR of 14.  This is up from a creatinine of 3.0 identified in early February 2023 at [facility]. ? ?Patient now presents to [facility] emergency department with complaints of several weeks of malaise and generalized weakness.  Patient explains that her symptoms were initially mild but progressively have become more and more severe.  This has been associated with increasingly worsening poor appetite.  Patient states that she is compliant with all of her medications.  Patient denies sick contacts, fevers, recent travel, shortness of breath.  Patient denies dysuria or back pain.   ? ?Due to patient's progressively worsening symptoms she eventually presented to see her nephrologist Dr. Hollie Salk at [facility].  Labs were performed and identified the patient's creatinine calcium and phosphorus were markedly elevated and she was instructed to go to the emergency department for evaluation. ? ?Upon evaluation in the emergency department repeat labs identified patient's creatinine continued to exhibit a significant rise, now  4.54.  Patient also was exhibiting substantial hypercalcemia 11.8 as well as hyperphosphatemia of 7.5.  ER provider discussed case with Dr. Johnney Ou with nephrology who stated that admission to [facility] long was appropriate and that nephrology would consult on the patient in the morning and recommended hospitalist admission overnight for management of electrolyte abnormalities.  The hospitalist group has now been called to assess the patient for admission to the hospital. ? ?Review of Systems: Review of Systems  ?Constitutional:  Positive for malaise/fatigue.  ?Gastrointestinal:  Positive for nausea.  ?Neurological:  Positive for dizziness and weakness.  ? ? ?Past Medical History:  ?Diagnosis Date  ? Abdominal distension (gaseous) 04/16/2018  ? AIN grade II   ? AIN grade III 04/05/2020  ? AKI (acute kidney injury) ([place]) 08/18/2021  ? Anemia   ? Anemia associated with chronic renal failure   ? Anorectal disorder 06/08/2020  ? Anorectal pain 06/08/2020  ? Anxiety   ? Back pain   ? Bloating 04/16/2018  ? Cancer [facility])   ? pre cervical  ? 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 of the [place]  ? Chronic kidney disease   ? Complications due to cardiac device, implant, and graft 09/02/2012  ? Condyloma of female genitalia   ? Dehydration 01/19/2019  ? Depression   ? Dialysis patient [facility])   ? Drug-induced bleeding disorder ([place]) 01/19/2019  ? DVT of axillary vein, acute left ([place]) 08/02/2012  ? Dysmenorrhea 10/19/2015  ? Dyspnea on exertion 01/19/2019  ? Dysuria   ? End stage renal disease ([place]) 09/02/2012  ? ESRD (end stage renal disease) ([place])   ? Essential hypertension with goal  blood pressure less than 130/80   ? 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 Fatigue   ? Gastritis, acute   ? Gout   ? History of parathyroidectomy ([place]) 10/29/2018  ? History of renal transplant   ? HTN (hypertension) 01/19/2019  ? Hypercalcemia   ? Hypercholesterolemia   ? Hyperlipidemia   ? Hypokalemia   ? Hypomagnesemia   ? Hypothyroid  01/19/2019  ? IBS (irritable bowel syndrome)   ? Immunosuppressive management encounter following kidney transplant 10/29/2018  ? Internal hemorrhoid 04/22/2018  ? Iron deficiency anemia 10/19/2015  ? Irregular uterine bleeding 02/13/2016  ? Kidney disorder 06/08/2020  ? Leg edema   ? Mechanical complication of other vascular device, implant, and graft 09/02/2012  ? Metabolic acidosis   ? Murmur 01/19/2019  ? Nausea and vomiting 01/19/2019  ? 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 Other chronic sinusitis 10/29/2018  ? Palpitations   ? Pancreatitis   ? Perianal lesion 04/22/2018  ? Phlebitis and thrombophlebitis of other sites 08/02/2012  ? Plantar fasciitis 07/03/2018  ? Pruritus ani 06/26/2018  ? Renal failure, chronic   ? Dialysis in the past  ? Right lower quadrant abdominal pain 10/29/2018  ? [facility] spotted fever   ? S/p cadaver renal transplant   ? Secondary hyperparathyroidism of renal origin [facility])   ? Secondary hypertension due to renal disease 10/29/2018  ? Sesamoiditis 07/03/2018  ? 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   ? Transplanted organ removal status 10/29/2018  ? Unstable angina ([place]) 01/19/2019  ? Urinary tract infection   ? Vulvar dystrophy   ? Yeast dermatitis 08/14/2018  ? ? ?Past Surgical History:  ?Procedure Laterality Date  ? AV FISTULA PLACEMENT    ? COLONOSCOPY  11/28/2005  ? [facility]  ? COLONOSCOPY  04/13/2016  ? Dr Orlena Sheldon  ? ESOPHAGOGASTRODUODENOSCOPY  07/31/2017  ? Distal esophageal ulcers (likely due to gastroeesphageal reflix-biopsied). Small hiatal hernia. Erosive gastritis.  ? HEMORRHOID SURGERY    ? 2019, and 04/29/2020  ? INSERTION OF DIALYSIS CATHETER    ? [place], 2008  ? KNEE SURGERY Left 2020  ? PARATHYROIDECTOMY  2004  ? Portion on the parathyroid gland transplanted in R forearm  ? REMOVAL OF A DIALYSIS CATHETER    ? SIMPLE VULVECTOMY  06/19/2011  ? Partial simple left  ? SKIN CANCER EXCISION  2016  ? Per pt, area removed on scalp showed  squamous cell carcinoma  ? TUBAL LIGATION    ? ? ?Social History:  reports that she has never smoked. She has never used smokeless tobacco. She reports that she does not drink alcohol and does not use drugs. ? ?Allergies  ?Allergen Reactions  ? Bactrim [Sulfamethoxazole-Trimethoprim] Anaphylaxis  ? Sulfamethoxazole Anaphylaxis  ? Hydrogen Peroxide Rash  ? Labetalol   ? Wound Dressing Adhesive Hives  ? Zolpidem   ? Amlodipine Swelling  ? Hyoscyamine Other (See Comments)  ?  Dizziness ?  ? Keflex [Cephalexin] Hives and Swelling  ?  Per pt, her face and lips swell up and she develops hives  ? Adhesive [Tape] Rash  ? Imuran [Azathioprine Sodium] Rash  ? Neosporin [Bacitracin-Polymyxin B] Rash and Other (See Comments)  ?  Can use that for awhile, but will eventually develop a rash   ? Tramadol Rash  ? Warfarin Sodium Rash  ? ? ?Family History  ?Problem Relation Age of Onset  ? Hypertension Mother   ? Hyperlipidemia Mother   ?  Diabetes Father   ? Rectal cancer Maternal Grandmother   ? Colon cancer Maternal Grandmother   ? Esophageal cancer Neg Hx   ? ? ?Prior to Admission medications   ?Medication Sig Start Date End Date Taking? Authorizing Provider  ?acetaminophen (TYLENOL) 500 MG tablet Take 500 mg by mouth every 6 (six) hours as needed for headache (pain).    [provider]  ?allopurinol (ZYLOPRIM) 100 MG tablet TAKE 1 TABLET(100 MG) BY MOUTH DAILY 10/13/20   [provider]  ?ALPRAZolam Duanne Moron) 0.5 MG tablet Take 0.5 mg by mouth at bedtime.  05/31/15   [provider]  ?calcitRIOL (ROCALTROL) 0.5 MCG capsule Take 0.5 mcg by mouth 2 (two) times daily.    [provider]  ?carvedilol (COREG) 25 MG tablet Take 25 mg by mouth 2 (two) times daily. 10/26/21   [provider]  ?famotidine (PEPCID) 20 MG tablet Take 1 tablet (20 mg total) by mouth daily. 07/19/21   Jackquline Denmark, MD  ?furosemide (LASIX) 40 MG tablet Take 40 mg by mouth daily as needed for fluid or edema.      [provider]  ?hydrOXYzine (ATARAX) 25 MG tablet Take 25-50 mg by mouth 2 (two) times daily as needed for dizziness. 10/25/21   [provider]  ?isosorbide mononitrate (IMDUR) 30 MG 24 hr tablet Ta

## 2021-12-20 NOTE — Assessment & Plan Note (Deleted)
·   Please see assessment and plan above °

## 2021-12-20 NOTE — Assessment & Plan Note (Addendum)
?   Patient has known history of secondary hyperparathyroidism secondary to known renal disease ?? Patient is typically on calcitriol in the outpatient setting for mild disease ?? Patient now presenting with significant worsening of hyperphosphatemia and hypocalcemia in the setting of worsening renal function ?? Treating underlying renal injury with gentle intravenous hydration ?? Holding home regimen of calcitriol ?? Considering severity of elevation of phosphate and calcium instead placing patient on modest dosing of Cinacalcet as well as moderate dosing of Renvela ?? Obtaining vitamin D levels as well as serial chemistries which will include serial phosphate and serial calcium levels ?? Obtain a PTH ?? Appreciate nephrology's guidance on further management of this in the morning. ? ?

## 2021-12-20 NOTE — ED Provider Notes (Addendum)
?[facility] DEPT ?Provider Note ? ? ?CSN: [id number] ?Arrival date & time: 12/20/21  1803 ? ?  ? ?History ? ?Chief Complaint  ?Patient presents with  ? Abnormal Labs  ? ? ?Ariel [name] is a 46 y.o. female history of CKD not on dialysis, here presenting with abnormal labs.  Patient states that she has been weaker and more tired recently.  She saw her nephrologist, Dr. Hollie Salk this morning.  She had labs checked and was called because she has hypercalcemia and hyper phosphatemia and has worsening renal function.  She states that she still urinates and urinates at least 3-4 times a day.  Patient is on the renal transplant list and is not currently on dialysis.  She states that she had a left forearm graft that was clotted.  ? ?The history is provided by the patient.  ? ?  ? ?Home Medications ?Prior to Admission medications   ?Medication Sig Start Date End Date Taking? Authorizing Provider  ?acetaminophen (TYLENOL) 500 MG tablet Take 500 mg by mouth every 6 (six) hours as needed for headache (pain).    [provider]  ?allopurinol (ZYLOPRIM) 100 MG tablet TAKE 1 TABLET(100 MG) BY MOUTH DAILY 10/13/20   [provider]  ?ALPRAZolam Duanne Moron) 0.5 MG tablet Take 0.5 mg by mouth at bedtime.  05/31/15   [provider]  ?calcitRIOL (ROCALTROL) 0.5 MCG capsule Take 0.5 mcg by mouth 2 (two) times daily.    [provider]  ?carvedilol (COREG) 25 MG tablet Take 25 mg by mouth 2 (two) times daily. 10/26/21   [provider]  ?famotidine (PEPCID) 20 MG tablet Take 1 tablet (20 mg total) by mouth daily. 07/19/21   Jackquline Denmark, MD  ?furosemide (LASIX) 40 MG tablet Take 40 mg by mouth daily as needed for fluid or edema.     [provider]  ?hydrOXYzine (ATARAX) 25 MG tablet Take 25-50 mg by mouth 2 (two) times daily as needed for dizziness. 10/25/21   [provider]  ?isosorbide mononitrate (IMDUR) 30 MG 24 hr tablet Take 1 tablet (30  mg total) by mouth every morning. 11/06/21   Revankar, Reita Cliche, MD  ?magnesium gluconate (MAGONATE) 500 MG tablet Take 500 mg by mouth 2 (two) times daily.    [provider]  ?medroxyPROGESTERone (PROVERA) 10 MG tablet Take 10 mg by mouth daily. 04/09/17   [provider]  ?Menthol-Zinc Oxide (CALMOSEPTINE) 0.44-20.6 % OINT Apply 1 application topically 2 (two) times daily. 05/27/20   [provider]  ?mycophenolate (MYFORTIC) 360 MG TBEC EC tablet Take 720 mg by mouth 2 (two) times daily.  02/24/19   [provider]  ?ondansetron (ZOFRAN-ODT) 4 MG disintegrating tablet Take 4 mg by mouth as needed for nausea/vomiting. 03/25/18   [provider]  ?Potassium Chloride ER 20 MEQ TBCR Take 20 mEq by mouth daily. 09/07/17   [provider]  ?predniSONE (DELTASONE) 5 MG tablet Take 5 mg by mouth daily.      [provider]  ?sertraline (ZOLOFT) 50 MG tablet Take 50 mg by mouth daily.    [provider]  ?simvastatin (ZOCOR) 5 MG tablet Take 5 mg by mouth at bedtime. 12/07/19   [provider]  ?sodium bicarbonate 650 MG tablet Take 2 tablets by mouth every evening. 12/14/20   [provider]  ?tacrolimus (PROGRAF) 0.5 MG capsule Take 0.5 mg by mouth 2 (two) times daily.    [provider]  ?tacrolimus (  PROGRAF) 1 MG capsule Take 1 mg by mouth 2 (two) times daily. 03/20/17   [provider]  ?traZODone (DESYREL) 100 MG tablet Take 100 mg by mouth at bedtime as needed for sleep. 10/05/21   [provider]  ?Colchicine 0.6 MG CAPS Take 1 capsule by mouth daily. 01/24/20 02/28/20  [provider]  ?   ? ?Allergies    ?Bactrim [sulfamethoxazole-trimethoprim], Sulfamethoxazole, Hydrogen peroxide, Labetalol, Wound dressing adhesive, Zolpidem, Amlodipine, Hyoscyamine, Keflex [cephalexin], Adhesive [tape], Imuran [azathioprine sodium], Neosporin [bacitracin-polymyxin b], Tramadol, and Warfarin sodium   ? ?Review of Systems    ?Review of Systems  ?Neurological:  Positive for weakness.  ?All other systems reviewed and are negative. ? ?Physical Exam ?Updated Vital Signs ?BP 126/78   Pulse 79   Temp 98.2 ?F (36.8 ?C) (Oral)   Resp (!) 22   SpO2 100%  ?Physical Exam ?Vitals and nursing note reviewed.  ?HENT:  ?   Head: Normocephalic.  ?   Nose: Nose normal.  ?   Mouth/Throat:  ?   Mouth: Mucous membranes are moist.  ?Eyes:  ?   Extraocular Movements: Extraocular movements intact.  ?   Pupils: Pupils are equal, round, and reactive to light.  ?Cardiovascular:  ?   Rate and Rhythm: Normal rate and regular rhythm.  ?   Pulses: Normal pulses.  ?   Heart sounds: Normal heart sounds.  ?Pulmonary:  ?   Effort: Pulmonary effort is normal.  ?   Breath sounds: Normal breath sounds.  ?Abdominal:  ?   General: Abdomen is flat.  ?   Palpations: Abdomen is soft.  ?Musculoskeletal:     ?   General: Normal range of motion.  ?   Cervical back: Normal range of motion and neck supple.  ?   Comments: L forearm with graft with no thrill   ?Skin: ?   General: Skin is warm.  ?   Capillary Refill: Capillary refill takes less than 2 seconds.  ?Neurological:  ?   General: No focal deficit present.  ?   Mental Status: She is alert and oriented to person, place, and time.  ?Psychiatric:     ?   Mood and Affect: Mood normal.     ?   Behavior: Behavior normal.  ? ? ?ED Results / Procedures / Treatments   ?Labs ?(all labs ordered are listed, but only abnormal results are displayed) ?Labs Reviewed  ?PHOSPHORUS - Abnormal; Notable for the following components:  ?    Result Value  ? Phosphorus 7.5 (*)   ? All other components within normal limits  ?COMPREHENSIVE METABOLIC PANEL - Abnormal; Notable for the following components:  ? Sodium 133 (*)   ? CO2 21 (*)   ? Glucose, Bld 110 (*)   ? BUN 35 (*)   ? Creatinine, Ser 4.54 (*)   ? Calcium 11.8 (*)   ? AST 13 (*)   ? GFR, Estimated 12 (*)   ? All other components within normal limits  ?CBG MONITORING, ED - Abnormal;  Notable for the following components:  ? Glucose-Capillary 116 (*)   ? All other components within normal limits  ?CBC  ?URINALYSIS, ROUTINE W REFLEX MICROSCOPIC  ?I-STAT BETA HCG BLOOD, ED ([facility], [facility], [facility] ONLY)  ? ? ?EKG ?EKG Interpretation ? ?Date/Time:  Wednesday December 20 2021 20:14:49 EDT ?Ventricular Rate:  80 ?PR Interval:  146 ?QRS Duration: 79 ?QT Interval:  370 ?QTC Calculation: 427 ?R Axis:   7 ?Text Interpretation:  Sinus rhythm Low voltage, precordial leads RSR' in V1 or V2, probably normal variant No significant change since last tracing Confirmed by Wandra Arthurs [phone]) on 12/20/2021 8:42:02 PM ? ?Radiology ?No results found. ? ?Procedures ?Procedures  ? ? ?CRITICAL CARE ?Performed by: Wandra Arthurs ? ? ?Total critical care time: 30 minutes ? ?Critical care time was exclusive of separately billable procedures and treating other patients. ? ?Critical care was necessary to treat or prevent imminent or life-threatening deterioration. ? ?Critical care was time spent personally by me on the following activities: development of treatment plan with patient and/or surrogate as well as nursing, discussions with consultants, evaluation of patient's response to treatment, examination of patient, obtaining history from patient or surrogate, ordering and performing treatments and interventions, ordering and review of laboratory studies, ordering and review of radiographic studies, pulse oximetry and re-evaluation of patient's condition. ? ? ?Medications Ordered in ED ?Medications  ?sodium chloride 0.9 % bolus 1,000 mL (1,000 mLs Intravenous New Bag/Given 12/20/21 2018)  ? ? ?ED Course/ Medical Decision Making/ A&P ?  ?                        ?Medical Decision Making ?Ariel [name] is a 46 y.o. female here with abnormal labs.  Patient had labs drawn this morning and had acute renal failure along with hypercalcemia and hyper phosphatemia.  Patient is still urinating and appears dehydrated.  We will recheck chemistry  and also phosphorus level ? ?9:31 PM ?Patient's creatinine is up to 4.5 and calcium is up to 11.8 and phosphorus is 7.5.  I discussed case with Dr. Johnney Ou from nephrology.  She recommend IV fluids since patie

## 2021-12-20 NOTE — ED Notes (Signed)
Pt bruises easily and states that she usually needs Ultrasound. ?

## 2021-12-20 NOTE — ED Provider Triage Note (Signed)
Emergency Medicine Provider Triage Evaluation Note ? ?[place] , a 46 y.o. female  was evaluated in triage.  Pt sent here for abnormal labs. Hx of 2 kidney transplants, on list for 3rd. Was seen at [facility] for 2 weeks of lightheadedness and fatigue. She was told her calcium and phosphorous were extremely high.  ? ?Review of Systems  ?Positive: As above, shoulder/neck/back pain ?Negative: Fever, abdominal pain ? ?Physical Exam  ?BP 116/79 (BP Location: Right Arm)   Pulse 89   Temp 98.2 ?F (36.8 ?C) (Oral)   Resp 16   SpO2 99%  ?Gen:   Awake, no distress   ?Resp:  Normal effort  ?MSK:   Moves extremities without difficulty  ?Other:   ? ?Medical Decision Making  ?Medically screening exam initiated at 6:52 PM.  Appropriate orders placed.  Donielle Dawn Henney was informed that the remainder of the evaluation will be completed by another provider, this initial triage assessment does not replace that evaluation, and the importance of remaining in the ED until their evaluation is complete. ? ? ?  ?Kateri Plummer, PA-C ?12/20/21 1852 ? ?

## 2021-12-20 NOTE — ED Notes (Signed)
ED TO INPATIENT HANDOFF REPORT  ED Nurse Name and Phone #: Louie Casa Name/Age/Gender Ariel [name] 46 y.o. female Room/Bed: RESA/RESA  Code Status   Code Status: Prior  Home/SNF/Other Home Patient oriented to: self, place, time, and situation Is this baseline? Yes   Triage Complete: Triage complete  Chief Complaint Acute renal failure superimposed on stage 4 chronic kidney disease (HCC) [N17.9, N18.4]  Triage Note Pt presents d/t abnormal labs.  Pt reports she is 2x kidney transplant recipient and is on the list for a 3rd.  Pt c/o feeling "foggy headed" x2 weeks.  Denies new pain.    Allergies Allergies  Allergen Reactions   Bactrim [Sulfamethoxazole-Trimethoprim] Anaphylaxis   Sulfamethoxazole Anaphylaxis   Hydrogen Peroxide Rash   Labetalol    Wound Dressing Adhesive Hives   Zolpidem    Amlodipine Swelling   Hyoscyamine Other (See Comments)    Dizziness    Keflex [Cephalexin] Hives and Swelling    Per pt, her face and lips swell up and she develops hives   Adhesive [Tape] Rash   Imuran [Azathioprine Sodium] Rash   Neosporin [Bacitracin-Polymyxin B] Rash and Other (See Comments)    Can use that for awhile, but will eventually develop a rash    Tramadol Rash   Warfarin Sodium Rash    Level of Care/Admitting Diagnosis ED Disposition     ED Disposition  Admit   Condition  --   Comment  Hospital Area: [facility] [[id number]]  Level of Care: Telemetry [5]  Admit to tele based on following criteria: Monitor for Ischemic changes  May admit patient to [facility] or [facility] if equivalent level of care is available:: No  Covid Evaluation: Asymptomatic - no recent exposure (last 10 days) testing not required  Diagnosis: Acute renal failure superimposed on stage 4 chronic kidney disease [facility]) [[id number]]  Admitting Physician: Marinda Elk [[id number]]  Attending Physician: Marinda Elk [[id number]]  Estimated length of stay: 3 - 4  days  Certification:: I certify this patient will need inpatient services for at least 2 midnights          B Medical/Surgery History 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   AKI (acute kidney injury) (HCC) 08/18/2021   Anemia    Anemia associated with chronic renal failure    Anorectal disorder 06/08/2020   Anorectal pain 06/08/2020   Anxiety    Back pain    Bloating 04/16/2018   Cancer (HCC)    pre cervical   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    of the Labia Minora   Chronic kidney disease    Complications due to cardiac device, implant, and graft 09/02/2012   Condyloma of female genitalia    Dehydration 01/19/2019   Depression    Dialysis patient [facility])    Drug-induced bleeding disorder (HCC) 01/19/2019   DVT of axillary vein, acute left (HCC) 08/02/2012   Dysmenorrhea 10/19/2015   Dyspnea on exertion 01/19/2019   Dysuria    End stage renal disease (HCC) 09/02/2012   ESRD (end stage renal disease) (HCC)    Essential hypertension with goal blood pressure less than 130/80    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   Fatigue    Gastritis, acute    Gout    History of parathyroidectomy (HCC) 10/29/2018   History of renal transplant    HTN (  hypertension) 01/19/2019   Hypercalcemia    Hypercholesterolemia    Hyperlipidemia    Hypokalemia    Hypomagnesemia    Hypothyroid 01/19/2019   IBS (irritable bowel syndrome)    Immunosuppressive management encounter following kidney transplant 10/29/2018   Internal hemorrhoid 04/22/2018   Iron deficiency anemia 10/19/2015   Irregular uterine bleeding 02/13/2016   Kidney disorder 06/08/2020   Leg edema    Mechanical complication of other vascular device, implant, and graft 09/02/2012   Metabolic acidosis    Murmur 01/19/2019   Nausea and vomiting 01/19/2019   Nephrogenic diabetes insipidus (HCC)    congenital   Other chronic sinusitis 10/29/2018    Palpitations    Pancreatitis    Perianal lesion 04/22/2018   Phlebitis and thrombophlebitis of other sites 08/02/2012   Plantar fasciitis 07/03/2018   Pruritus ani 06/26/2018   Renal failure, chronic    Dialysis in the past   Right lower quadrant abdominal pain 10/29/2018   [facility] spotted fever    S/p cadaver renal transplant    Secondary hyperparathyroidism of renal origin [facility])    Secondary hypertension due to renal disease 10/29/2018   Sesamoiditis 07/03/2018   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    Transplanted organ removal status 10/29/2018   Unstable angina (HCC) 01/19/2019   Urinary tract infection    Vulvar dystrophy    Yeast dermatitis 08/14/2018   Past Surgical History:  Procedure Laterality Date   AV FISTULA PLACEMENT     COLONOSCOPY  11/28/2005   [facility]   COLONOSCOPY  04/13/2016   Dr Rayfield Citizen   ESOPHAGOGASTRODUODENOSCOPY  07/31/2017   Distal esophageal ulcers (likely due to gastroeesphageal reflix-biopsied). Small hiatal hernia. Erosive gastritis.   HEMORRHOID SURGERY     2019, and 04/29/2020   INSERTION OF DIALYSIS CATHETER     KIDNEY TRANSPLANT  1995, 2008   KNEE SURGERY Left 2020   PARATHYROIDECTOMY  2004   Portion on the parathyroid gland transplanted in R forearm   REMOVAL OF A DIALYSIS CATHETER     SIMPLE VULVECTOMY  06/19/2011   Partial simple left   SKIN CANCER EXCISION  2016   Per pt, area removed on scalp showed squamous cell carcinoma   TUBAL LIGATION       A IV Location/Drains/Wounds Patient Lines/Drains/Airways Status     Active Line/Drains/Airways     Name Placement date Placement time Site Days   Peripheral IV 12/20/21 22 G 1" Right Hand 12/20/21  2013  Hand  less than 1            Intake/Output Last 24 hours No intake or output data in the 24 hours ending 12/20/21 2242  Labs/Imaging Results for orders placed or performed during the hospital encounter of 12/20/21 (from the past 48 hour(s))  CBC      Status: None   Collection Time: 12/20/21  8:15 PM  Result Value Ref Range   WBC 9.2 4.0 - 10.5 K/uL   RBC 4.10 3.87 - 5.11 MIL/uL   Hemoglobin 12.1 12.0 - 15.0 g/dL   HCT 16.1 09.6 - 04.5 %   MCV 94.1 80.0 - 100.0 fL   MCH 29.5 26.0 - 34.0 pg   MCHC 31.3 30.0 - 36.0 g/dL   RDW 40.9 81.1 - 91.4 %   Platelets 245 150 - 400 K/uL   nRBC 0.0 0.0 - 0.2 %    Comment: Performed at [facility], [address], [place], [place] [address]  Phosphorus     Status: Abnormal   Collection Time: 12/20/21  8:15 PM  Result Value Ref Range   Phosphorus 7.5 (H) 2.5 - 4.6 mg/dL    Comment: Performed at [facility], [address], [place], [place] [address]  Comprehensive metabolic panel     Status: Abnormal   Collection Time: 12/20/21  8:15 PM  Result Value Ref Range   Sodium 133 (L) 135 - 145 mmol/L   Potassium 4.3 3.5 - 5.1 mmol/L   Chloride 102 98 - 111 mmol/L   CO2 21 (L) 22 - 32 mmol/L   Glucose, Bld 110 (H) 70 - 99 mg/dL    Comment: Glucose reference range applies only to samples taken after fasting for at least 8 hours.   BUN 35 (H) 6 - 20 mg/dL   Creatinine, Ser 6.04 (H) 0.44 - 1.00 mg/dL   Calcium 54.0 (H) 8.9 - 10.3 mg/dL   Total Protein 6.8 6.5 - 8.1 g/dL   Albumin 3.9 3.5 - 5.0 g/dL   AST 13 (L) 15 - 41 U/L   ALT 9 0 - 44 U/L   Alkaline Phosphatase 41 38 - 126 U/L   Total Bilirubin 0.8 0.3 - 1.2 mg/dL   GFR, Estimated 12 (L) >60 mL/min    Comment: (NOTE) Calculated using the CKD-EPI Creatinine Equation (2021)    Anion gap 10 5 - 15    Comment: Performed at [facility], [address], [place], [place] [address]  I-Stat beta hCG blood, ED     Status: None   Collection Time: 12/20/21  8:17 PM  Result Value Ref Range   I-stat hCG, quantitative <5.0 <5 mIU/mL   Comment 3            Comment:   GEST. AGE      CONC.  (mIU/mL)   <=1 WEEK        5 - 50     2 WEEKS       50 - 500     3 WEEKS       100 - 10,000     4 WEEKS      1,000 - 30,000        FEMALE AND NON-PREGNANT FEMALE:     LESS THAN 5 mIU/mL   CBG monitoring, ED     Status: Abnormal   Collection Time: 12/20/21  8:21 PM  Result Value Ref Range   Glucose-Capillary 116 (H) 70 - 99 mg/dL    Comment: Glucose reference range applies only to samples taken after fasting for at least 8 hours.   No results found.  Pending Labs Unresulted Labs (From admission, onward)     Start     Ordered   12/21/21 0500  Phosphorus  Tomorrow morning,   R        12/20/21 2202   12/20/21 2202  Parathyroid hormone, intact (no Ca)  Once,   R        12/20/21 2201   12/20/21 2202  Calcitriol (1,25 di-OH Vit D)  Once,   R        12/20/21 2202   12/20/21 1851  Urinalysis, Routine w reflex microscopic Urine, Clean Catch  Once,   URGENT        12/20/21 1850            Vitals/Pain Today's Vitals   12/20/21 2145 12/20/21 2152 12/20/21 2215 12/20/21 2230  BP: 138/88  (!) 127/91 (!) 135/93  Pulse: 74  89 83  Resp: 20   20  Temp:    98.8 F (37.1 C)  TempSrc:    Oral  SpO2: 100%  100% 98%  PainSc:  9       Isolation Precautions No active isolations  Medications Medications  sodium chloride 0.9 % bolus 1,000 mL (1,000 mLs Intravenous New Bag/Given 12/20/21 2018)  metoCLOPramide (REGLAN) injection 10 mg (10 mg Intravenous Given 12/20/21 2149)  diphenhydrAMINE (BENADRYL) injection 12.5 mg (12.5 mg Intravenous Given 12/20/21 2149)    Mobility walks Low fall risk   Focused Assessments Renal Assessment Handoff:  Hemodialysis Schedule: Not currently, post kidney transplant 1995 and 2008, trending decrease kidney function, considering returning to dialysis Last Hemodialysis date and time: -NA   Restricted appendage: left arm  Still makes urine  R Recommendations: See Admitting Provider Note  Report given to:   Additional Notes: - difficult IV access, had 22G R Hand, currently infusing bolus

## 2021-12-20 NOTE — Assessment & Plan Note (Signed)
.   Continuing home regimen of lipid lowering therapy.  

## 2021-12-20 NOTE — Assessment & Plan Note (Signed)
?   Continue home regimen of allopurinol ?

## 2021-12-20 NOTE — Assessment & Plan Note (Signed)
?   Known history of renal transplant x2, last transplant being in 2015 ?? Patient is unfortunately experienced rejection with both transplants with ongoing decline in renal function since the last transplant ?? Despite this, patient continues to be on a tacrolimus based immunosuppressive therapy including tacrolimus, mycophenolate and low-dose prednisone.  These will be continued at this time ?? Patient to continue following as an outpatient with [facility] transplant [facility] ?

## 2021-12-20 NOTE — Assessment & Plan Note (Addendum)
?   Patient exhibiting evidence of acute kidney injury superimposed on stage V chronic kidney disease ?? 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 ?? Etiology currently unclear ?? Creatinine is currently 4.54 an increase compared to baseline of 3.82 ?? Hydrating patient gently with intravenous isotonic fluids. ?? Strict input and output monitoring ?? Monitoring renal function and electrolytes with serial chemistries ?? Avoiding nephrotoxic agents if at all possible ?? Obtaining urine electrolytes and urinalysis. ?? ER provider has discussed case with Dr. Johnney Ou with nephrology who recommended hospitalist admission with nephrology to consult tomorrow.  It was felt the patient did not require hospitalization at [facility] at this time. ?

## 2021-12-20 NOTE — ED Triage Notes (Signed)
Pt presents d/t abnormal labs.  Pt reports she is 2x kidney transplant recipient and is on the list for a 3rd.  Pt c/o feeling "foggy headed" x2 weeks.  Denies new pain.  ?

## 2021-12-20 NOTE — Assessment & Plan Note (Signed)
?   Continue home regimen of H2 blocker ?

## 2021-12-20 NOTE — Assessment & Plan Note (Signed)
.   Resume patients home regimen of oral antihypertensives . Titrate antihypertensive regimen as necessary to achieve adequate BP control . PRN intravenous antihypertensives for excessively elevated blood pressure   

## 2021-12-21 ENCOUNTER — Inpatient Hospital Stay (HOSPITAL_COMMUNITY): Payer: Medicare Other

## 2021-12-21 ENCOUNTER — Encounter (HOSPITAL_COMMUNITY): Payer: Self-pay | Admitting: Internal Medicine

## 2021-12-21 DIAGNOSIS — N179 Acute kidney failure, unspecified: Secondary | ICD-10-CM | POA: Diagnosis not present

## 2021-12-21 DIAGNOSIS — E66811 Obesity, class 1: Secondary | ICD-10-CM

## 2021-12-21 DIAGNOSIS — E669 Obesity, unspecified: Secondary | ICD-10-CM

## 2021-12-21 DIAGNOSIS — N185 Chronic kidney disease, stage 5: Secondary | ICD-10-CM | POA: Diagnosis not present

## 2021-12-21 HISTORY — DX: Obesity, class 1: E66.811

## 2021-12-21 HISTORY — DX: Obesity, unspecified: E66.9

## 2021-12-21 LAB — CBC WITH DIFFERENTIAL/PLATELET
Abs Immature Granulocytes: 0.07 10*3/uL (ref 0.00–0.07)
Basophils Absolute: 0 10*3/uL (ref 0.0–0.1)
Basophils Relative: 0 %
Eosinophils Absolute: 0 10*3/uL (ref 0.0–0.5)
Eosinophils Relative: 0 %
HCT: 32.4 % — ABNORMAL LOW (ref 36.0–46.0)
Hemoglobin: 10.2 g/dL — ABNORMAL LOW (ref 12.0–15.0)
Immature Granulocytes: 1 %
Lymphocytes Relative: 24 %
Lymphs Abs: 1.7 10*3/uL (ref 0.7–4.0)
MCH: 29.6 pg (ref 26.0–34.0)
MCHC: 31.5 g/dL (ref 30.0–36.0)
MCV: 93.9 fL (ref 80.0–100.0)
Monocytes Absolute: 0.8 10*3/uL (ref 0.1–1.0)
Monocytes Relative: 11 %
Neutro Abs: 4.5 10*3/uL (ref 1.7–7.7)
Neutrophils Relative %: 64 %
Platelets: 202 10*3/uL (ref 150–400)
RBC: 3.45 MIL/uL — ABNORMAL LOW (ref 3.87–5.11)
RDW: 13.3 % (ref 11.5–15.5)
WBC: 7.1 10*3/uL (ref 4.0–10.5)
nRBC: 0 % (ref 0.0–0.2)

## 2021-12-21 LAB — COMPREHENSIVE METABOLIC PANEL
ALT: 8 U/L (ref 0–44)
AST: 9 U/L — ABNORMAL LOW (ref 15–41)
Albumin: 3.2 g/dL — ABNORMAL LOW (ref 3.5–5.0)
Alkaline Phosphatase: 30 U/L — ABNORMAL LOW (ref 38–126)
Anion gap: 8 (ref 5–15)
BUN: 32 mg/dL — ABNORMAL HIGH (ref 6–20)
CO2: 21 mmol/L — ABNORMAL LOW (ref 22–32)
Calcium: 10.1 mg/dL (ref 8.9–10.3)
Chloride: 110 mmol/L (ref 98–111)
Creatinine, Ser: 4.29 mg/dL — ABNORMAL HIGH (ref 0.44–1.00)
GFR, Estimated: 12 mL/min — ABNORMAL LOW (ref >60)
Glucose, Bld: 87 mg/dL (ref 70–99)
Potassium: 3.9 mmol/L (ref 3.5–5.1)
Sodium: 139 mmol/L (ref 135–145)
Total Bilirubin: 0.4 mg/dL (ref 0.3–1.2)
Total Protein: 5.6 g/dL — ABNORMAL LOW (ref 6.5–8.1)

## 2021-12-21 LAB — HIV ANTIBODY (ROUTINE TESTING W REFLEX): HIV Screen 4th Generation wRfx: NONREACTIVE

## 2021-12-21 LAB — PHOSPHORUS: Phosphorus: 6.2 mg/dL — ABNORMAL HIGH (ref 2.5–4.6)

## 2021-12-21 LAB — MAGNESIUM: Magnesium: 1.4 mg/dL — ABNORMAL LOW (ref 1.7–2.4)

## 2021-12-21 MED ORDER — MAGNESIUM SULFATE 2 GM/50ML IV SOLN
2.0000 g | Freq: Once | INTRAVENOUS | Status: AC
  Administered 2021-12-21: 2 g via INTRAVENOUS
  Filled 2021-12-21: qty 50

## 2021-12-21 NOTE — Consult Note (Signed)
Renal Service ?Consult Note ?[facility] ? ?[place] ?12/21/2021 ?Sol Blazing, MD ?Requesting Physician: Dr Lupita Leash, R. ? ?Reason for Consult: Renal failure ?HPI: The patient is a 46 y.o. year-old w/ hx of anemia, esrd sp renal transplant w/ stage 5 CKD, HTN who presented 4/19 w/ c/o abnormal kidney labs (Ca > 14 and phos > 10, drawn 14/19 at [place]). Pt also feeling "foggy headed" for 2 wks. Pt seen in ED. VSS. Labs showed Ca 11.8, creat 4.5 and phos 7.5.  ? ?Has hx of medullary cystic kidney disease onset at age 41-8 w/ renal failure and was f/b [facility]. Had her 1st transplant 1995 at [facility], lasted until 2001. In 2001 went on HD then PD until 2008 and then had 2nd transplant at [facility] in 2008. She is f/b by Dr Hollie Salk at [facility] and by [facility] for her transplant. Last creat in this system is March 2028 was up to 3.8.  ? ?Pt denies every having cancer or taking chemoRx.  States she has recurrent peri-anal rashes related to prior hemorrhoids and sees GI colon/ rectal doctor in [place] for this (Dr. Constance Holster).  She is followed for her transplant by Dr Hollie Salk here and also has appts every 3-4 mos in [place].  ? ?Pt lives by herself, no kids, single. Lives in [place]. She has not been taking care of herself recently very well because her mother has been sick and requiring much attention. Her mother and father are in [place] as well.  ? ?Pt had parathyroidectomy "sometime while I was on HD" so between 2001 and 2008, that was done at [facility]. Her residual gland tissue was put in the R forearm.  If she needed further parathyroid surgery she would rather just do it here in [place].   ? ?Per Dr Hollie Salk, her PTH's have been "very high, around 3000" for a good while.  ? ?ROS - denies CP, no joint pain, no HA, no blurry vision, no rash, no diarrhea, no nausea/ vomiting, no dysuria, no difficulty voiding ? ? ?Past Medical History  ?Past Medical History:  ?Diagnosis Date  ? Abdominal distension (gaseous) 04/16/2018  ? AIN  grade II   ? AIN grade III 04/05/2020  ? AKI (acute kidney injury) ([place]) 08/18/2021  ? Anemia   ? Anemia associated with chronic renal failure   ? Anorectal disorder 06/08/2020  ? Anorectal pain 06/08/2020  ? Anxiety   ? Back pain   ? Bloating 04/16/2018  ? Cancer [facility])   ? pre cervical  ? 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 of the Labia Minora  ? Chronic kidney disease   ? Complications due to cardiac device, implant, and graft 09/02/2012  ? Condyloma of female genitalia   ? Dehydration 01/19/2019  ? Depression   ? Dialysis patient [facility])   ? Drug-induced bleeding disorder ([place]) 01/19/2019  ? DVT of axillary vein, acute left ([place]) 08/02/2012  ? Dysmenorrhea 10/19/2015  ? Dyspnea on exertion 01/19/2019  ? Dysuria   ? End stage renal disease ([place]) 09/02/2012  ? ESRD (end stage renal disease) ([place])   ? Essential hypertension with goal blood pressure less than 130/80   ? 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 Fatigue   ? Gastritis, acute   ? Gout   ? History of parathyroidectomy ([place]) 10/29/2018  ? History of renal transplant   ? HTN (hypertension) 01/19/2019  ? Hypercalcemia   ? Hypercholesterolemia   ? Hyperlipidemia   ?  Hypokalemia   ? Hypomagnesemia   ? Hypothyroid 01/19/2019  ? IBS (irritable bowel syndrome)   ? Immunosuppressive management encounter following kidney transplant 10/29/2018  ? Internal hemorrhoid 04/22/2018  ? Iron deficiency anemia 10/19/2015  ? Irregular uterine bleeding 02/13/2016  ? Kidney disorder 06/08/2020  ? Leg edema   ? Mechanical complication of other vascular device, implant, and graft 09/02/2012  ? Metabolic acidosis   ? Murmur 01/19/2019  ? Nausea and vomiting 01/19/2019  ? 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 Other chronic sinusitis 10/29/2018  ? Palpitations   ? Pancreatitis   ? Perianal lesion 04/22/2018  ? Phlebitis and thrombophlebitis of other sites 08/02/2012  ? Plantar fasciitis 07/03/2018  ? Pruritus ani 06/26/2018  ? Renal failure, chronic   ? Dialysis in the past   ? Right lower quadrant abdominal pain 10/29/2018  ? [facility] spotted fever   ? S/p cadaver renal transplant   ? Secondary hyperparathyroidism of renal origin [facility])   ? Secondary hypertension due to renal disease 10/29/2018  ? Sesamoiditis 07/03/2018  ? 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   ? Transplanted organ removal status 10/29/2018  ? Unstable angina ([place]) 01/19/2019  ? Urinary tract infection   ? Vulvar dystrophy   ? Yeast dermatitis 08/14/2018  ? ?Past Surgical History  ?Past Surgical History:  ?Procedure Laterality Date  ? AV FISTULA PLACEMENT    ? COLONOSCOPY  11/28/2005  ? [facility]  ? COLONOSCOPY  04/13/2016  ? Dr Orlena Sheldon  ? ESOPHAGOGASTRODUODENOSCOPY  07/31/2017  ? Distal esophageal ulcers (likely due to gastroeesphageal reflix-biopsied). Small hiatal hernia. Erosive gastritis.  ? HEMORRHOID SURGERY    ? 2019, and 04/29/2020  ? INSERTION OF DIALYSIS CATHETER    ? [place], 2008  ? KNEE SURGERY Left 2020  ? PARATHYROIDECTOMY  2004  ? Portion on the parathyroid gland transplanted in R forearm  ? REMOVAL OF A DIALYSIS CATHETER    ? SIMPLE VULVECTOMY  06/19/2011  ? Partial simple left  ? SKIN CANCER EXCISION  2016  ? Per pt, area removed on scalp showed squamous cell carcinoma  ? TUBAL LIGATION    ? ?Family History  ?Family History  ?Problem Relation Age of Onset  ? Hypertension Mother   ? Hyperlipidemia Mother   ? Diabetes Father   ? Rectal cancer Maternal Grandmother   ? Colon cancer Maternal Grandmother   ? Esophageal cancer Neg Hx   ? ?Social History  reports that she has never smoked. She has never used smokeless tobacco. She reports that she does not drink alcohol and does not use drugs. ?Allergies  ?Allergies  ?Allergen Reactions  ? Bactrim [Sulfamethoxazole-Trimethoprim] Anaphylaxis  ? Sulfamethoxazole Anaphylaxis  ? Hydrogen Peroxide Rash  ? Labetalol   ? Wound Dressing Adhesive Hives  ? Zolpidem   ? Amlodipine Swelling  ? Hyoscyamine Other (See Comments)  ?   Dizziness ?  ? Keflex [Cephalexin] Hives and Swelling  ?  Per pt, her face and lips swell up and she develops hives  ? Adhesive [Tape] Rash  ? Imuran [Azathioprine Sodium] Rash  ? Neosporin [Bacitracin-Polymyxin B] Rash and Other (See Comments)  ?  Can use that for awhile, but will eventually develop a rash   ? Tramadol Rash  ? Warfarin Sodium Rash  ? ?Home medications ?Prior to Admission medications   ?Medication Sig Start Date End Date Taking? Authorizing Provider  ?acetaminophen (TYLENOL) 500 MG tablet Take 500  mg by mouth every 6 (six) hours as needed for headache (pain).   Yes [provider]  ?allopurinol (ZYLOPRIM) 100 MG tablet TAKE 1 TABLET(100 MG) BY MOUTH DAILY 10/13/20  Yes [provider]  ?ALPRAZolam (XANAX) 0.5 MG tablet Take 0.5 mg by mouth at bedtime.  05/31/15  Yes [provider]  ?calcitRIOL (ROCALTROL) 0.5 MCG capsule Take 1 mcg by mouth daily. Take 2 capsules (1 mcg) daily   Yes [provider]  ?carvedilol (COREG) 25 MG tablet Take 25 mg by mouth 2 (two) times daily with a meal. 10/26/21  Yes [provider]  ?famotidine (PEPCID) 20 MG tablet Take 1 tablet (20 mg total) by mouth daily. 07/19/21  Yes Jackquline Denmark, MD  ?furosemide (LASIX) 40 MG tablet Take 40 mg by mouth daily as needed for fluid or edema.    Yes [provider]  ?hydrOXYzine (ATARAX) 25 MG tablet Take 25-50 mg by mouth 2 (two) times daily as needed for dizziness or anxiety. 10/25/21  Yes [provider]  ?isosorbide mononitrate (IMDUR) 30 MG 24 hr tablet Take 1 tablet (30 mg total) by mouth every morning. 11/06/21  Yes Revankar, Reita Cliche, MD  ?magnesium gluconate (MAGONATE) 500 MG tablet Take 500 mg by mouth every evening.   Yes [provider]  ?medroxyPROGESTERone (PROVERA) 10 MG tablet Take 10 mg by mouth daily. 04/09/17  Yes [provider]  ?mycophenolate (MYFORTIC) 360 MG TBEC EC tablet Take 360 mg by mouth 2 (two) times daily. 02/24/19  Yes [provider]  ?ondansetron (ZOFRAN-ODT) 4 MG disintegrating tablet Take 4 mg by mouth as needed for nausea/vomiting. 03/25/18  Yes [provider]  ?Potassium Chloride ER 20 MEQ TBCR Take 20 mEq by

## 2021-12-21 NOTE — Hospital Course (Addendum)
89ATF W/ complicated renal history including renal transplant x2 with rejection both times, follows with Dr. Hollie Salk presents with malaise and weakness sent to the ED by nephrology due to worsening electrolytes including hyperphosphatemia and hypercalcemia as well as worsening renal function.  ? Of note, patient's creatinine was 3.82 on 3/16 with a GFR of 14.  This is up from a creatinine of 3.0 identified in early February 2023 at [facility].Patient admitted for AKI on CKD stage V, secondary hyperparathyroidism, Dr. [name] was consulted.,  Started on cinacalcet and Renvela as well as intravenous fluids to address hyperphosphatemia and hypercalcemia.  At this time calcium has improved, PTH pending.  Creatinine is stable.  Patient will continue on her current medication Rocaltrol discontinued, continue Renvela, sensipar.  Patient will need follow-up with nephrology PCP outpatient. Stable for discahrge home ? ?

## 2021-12-21 NOTE — Progress Notes (Signed)
?PROGRESS NOTE ?Ariel [name]  HYQ:[id number] DOB: [birth year] DOA: 12/20/2021 ?PCP: [name], PA-C  ? ?Brief Narrative/Hospital Course: ?89[facility] W/ complicated renal history including renal transplant x2 with rejection both times, follows with Dr. Hollie Salk presents with malaise and weakness sent to the ED by nephrology due to worsening electrolytes including hyperphosphatemia and hypercalcemia as well as worsening renal function.  ? Of note, patient's creatinine was 3.82 on 3/16 with a GFR of 14.  This is up from a creatinine of 3.0 identified in early February 2023 at [facility]. ?Patient admitted for AKI on CKD stage V, secondary hyperparathyroidism, Dr. [name] was consulted.,  Started on cinacalcet and Renvela as well as intravenous fluids to address hyperphosphatemia and hypercalcemia.  ?  ?Subjective: ?Seen this am aa0x3, no complaints ?Creat slightly better  ? ?Assessment and Plan: ?Principal Problem: ?  Acute renal failure superimposed on stage 5 chronic kidney disease, not on chronic dialysis ([place]) ?Active Problems: ?  Secondary hyperparathyroidism of renal origin ([place]) ?  Hypercalcemia ?  Hyperphosphatemia ?  Failed kidney transplant ?  Essential hypertension ?  Mixed hyperlipidemia ?  Anxiety disorder ?  GERD without esophagitis ?  Gout ?  Hypomagnesemia ?  Obesity, Class I, BMI 30-34.9 ? 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  With some worsening creatinine unclear etiology, avoid nephrotoxic, continue gentle IV hydration follow-up urine and lites further work-up per nephrology who has been consulted.  Monitor intake output. ?Pt on  tacrolimus, mycophenolate and low-dose prednisone.  Continue bicarb. ?Ultrasound Doppler renal-"loculated postoperative changes diminished in size considerably  when compared to imaging dating back to December of 2022. Marked increased cortical echogenicity could be seen in the setting of transplant rejection or drug toxicity. Patent transplant artery and vein with normal resistive indices. RIGHT renal cyst" ?Recent Labs  ?Lab 12/20/21 ?2015 12/21/21 ?0552  ?BUN 35* 32*  ?CREATININE 4.54* 4.29*  ?  ?Secondary hyperparathyroidism on calcitriol at home, now with significant worsening of hyperphosphatemia and hypocalcemia.  Holding home calcitriol, monitor electrolytes, now on Cinacalcet, Renvela follow-up vitamin D PTH, nephrology to manage ? ?Hypomagnesemia repleted IV and p.o. ?Essential hypertension:Borderline controlled, continue home Coreg 25, Imdur, simvastatin. Cont prns  ?Anxiety disorder continue home Xanax, Zoloft, trazodone  ?HLD on simvastatin ?GERD on H2 blocker ?Gout continue allopurinol ? ?Class I Obesity:Patient's Body mass index is 34.42 kg/m?. : Will benefit with PCP follow-up, weight loss  healthy lifestyle and outpatient sleep evaluation. ? ? ?DVT prophylaxis: heparin injection 5,000 Units Start: 12/21/21 0600 ?Code Status:   Code Status: Full Code ?Family Communication: plan of care discussed with patient at bedside. ?Patient status is: Inpatient level of care: Telemetry  ?Remains inpatient because: Ongoing management of renal failure ?Patient currently not stable ? ?Dispo: The patient is from: Home ?           Anticipated disposition: Home in 2 to 3 days once cleared by nephrology ? ?Mobility Assessment (last 72 hours)   ? ? Mobility Assessment   ? ? [place] Name 12/20/21 2300  ?  ?  ?  ?  ? Does patient have an order for bedrest or is patient medically unstable No - Continue assessment      ? What is the highest level of mobility based on the progressive mobility assessment? Level 6 (Walks independently in room  and hall) - Balance while walking in room without assist - Complete      ? ?  ?  ? ?  ?  ? ?Objective: ?Vitals last 24 hrs: ?Vitals:  ? 2021/12/24  2307 24-Dec-2021 2315 12/21/21 0506 12/21/21 4268  ?BP: 129/81  (!) 145/101 (!) 166/107  ?Pulse: 86  80 81  ?Resp: '18  16 18  '$ ?Temp: 98.2 ?F (36.8 ?C)  98.1 ?F (36.7 ?C) 98.2 ?F (36.8 ?C)  ?TempSrc: Oral  Oral Oral  ?SpO2: 98%  97% 95%  ?Weight:  77.3 kg    ?Height:  '4\' 11"'$  (1.499 m)    ? ?Weight change:  ? ?Physical Examination: ?General exam: AA,older than stated age, weak appearing. ?HEENT:Oral mucosa moist, Ear/Nose WNL grossly, dentition normal. ?Respiratory system: bilaterally  clear BS, no use of accessory muscle ?Cardiovascular system: S1 & S2 +, No JVD,. ?Gastrointestinal system: Abdomen soft,NT,ND, BS+ ?Nervous System:Alert, awake, moving extremities and grossly nonfocal ?Extremities: LE edema none,distal peripheral pulses palpable.  ?Skin: No rashes,no icterus. ?MSK: Normal muscle bulk,tone, power ? ?Medications reviewed:  ?Scheduled Meds: ? [START ON 12/22/2021] allopurinol  50 mg Oral Q48H  ? ALPRAZolam  0.5 mg Oral QHS  ? carvedilol  25 mg Oral BID WC  ? cinacalcet  30 mg Oral Q supper  ? [START ON 12/22/2021] famotidine  10 mg Oral Q48H  ? heparin  5,000 Units Subcutaneous Q8H  ? isosorbide mononitrate  30 mg Oral q morning  ? magnesium gluconate  500 mg Oral QPM  ? medroxyPROGESTERone  10 mg Oral Daily  ? mycophenolate  360 mg Oral BID  ? predniSONE  5 mg Oral Daily  ? sertraline  50 mg Oral Daily  ? sevelamer carbonate  1,200 mg Oral TID WC  ? simvastatin  5 mg Oral QHS  ? sodium bicarbonate  1,300 mg Oral QPM  ? tacrolimus  0.5 mg Oral BID  ? tacrolimus  1 mg Oral BID  ? traZODone  100 mg Oral QHS  ? ?Continuous Infusions: ? sodium chloride 75 mL/hr at 12-24-21 2354  ? magnesium sulfate bolus IVPB 2 g (12/21/21 0852)  ? ? ?  ?Diet Order   ? ?       ?  Diet renal with fluid restriction Room service appropriate? Yes; Fluid consistency: Thin  Diet effective now       ?  ? ?  ?  ? ?  ? ?No intake or output data in the 24 hours ending 12/21/21 0912 ?Net IO Since Admission: No IO data has been entered for  this period [12/21/21 0912]  ?Wt Readings from Last 3 Encounters:  ?24-Dec-2021 77.3 kg  ?11/20/21 78.9 kg  ?11/16/21 77.1 kg  ?  ? ?Unresulted Labs (From admission, onward)  ? ?  Start     Ordered  ? 12-24-21 2314  Urea nitrogen, urine  Once,   R       ? 12-24-21 2314  ? Dec 24, 2021 2202  Parathyroid hormone, intact (no Ca)  Once,   R       ? Dec 24, 2021 2201  ? Dec 24, 2021 2202  Calcitriol (1,25 di-OH Vit D)  Once,   R       ? 12-24-2021 2202  ? ?  ?  ? ?  ?Data Reviewed: I have personally reviewed following labs and imaging studies ?CBC: ?Recent Labs  ?Lab 12-24-21 ?2015 12/21/21 ?0552  ?WBC 9.2 7.1  ?NEUTROABS  --  4.5  ?HGB 12.1 10.2*  ?HCT 38.6  32.4*  ?MCV 94.1 93.9  ?PLT 245 202  ? ?Basic Metabolic Panel: ?Recent Labs  ?Lab 12/20/21 ?2015 12/21/21 ?0552  ?NA 133* 139  ?K 4.3 3.9  ?CL 102 110  ?CO2 21* 21*  ?GLUCOSE 110* 87  ?BUN 35* 32*  ?CREATININE 4.54* 4.29*  ?CALCIUM 11.8* 10.1  ?MG  --  1.4*  ?PHOS 7.5* 6.2*  ? ?GFR: ?Estimated Creatinine Clearance: 15 mL/min (A) (by C-G formula based on SCr of 4.29 mg/dL (H)). ?Liver Function Tests: ?Recent Labs  ?Lab 12/20/21 ?2015 12/21/21 ?0552  ?AST 13* 9*  ?ALT 9 8  ?ALKPHOS 41 30*  ?BILITOT 0.8 0.4  ?PROT 6.8 5.6*  ?ALBUMIN 3.9 3.2*  ? ?No results for input(s): LIPASE, AMYLASE in the last 168 hours. ?No results for input(s): AMMONIA in the last 168 hours. ?Coagulation Profile: ?No results for input(s): INR, PROTIME in the last 168 hours. ?BNP (last 3 results) ?No results for input(s): PROBNP in the last 8760 hours. ?HbA1C: ?No results for input(s): HGBA1C in the last 72 hours. ?CBG: ?Recent Labs  ?Lab 12/20/21 ?2021  ?GLUCAP 116*  ? ?Lipid Profile: ?No results for input(s): CHOL, HDL, LDLCALC, TRIG, CHOLHDL, LDLDIRECT in the last 72 hours. ?Thyroid Function Tests: ?No results for input(s): TSH, T4TOTAL, FREET4, T3FREE, THYROIDAB in the last 72 hours. ?Sepsis Labs: ?No results for input(s): PROCALCITON, LATICACIDVEN in the last 168 hours. ? ?No results found for this or any  previous visit (from the past 240 hour(s)).  ?Antimicrobials: ?Anti-infectives (From admission, onward)  ? ? None  ? ?  ? ?Culture/Microbiology ?   ?Component Value Date/Time  ? [place], CLEAN CATCH 11/16/2021

## 2021-12-22 DIAGNOSIS — N185 Chronic kidney disease, stage 5: Secondary | ICD-10-CM | POA: Diagnosis not present

## 2021-12-22 DIAGNOSIS — N179 Acute kidney failure, unspecified: Secondary | ICD-10-CM | POA: Diagnosis not present

## 2021-12-22 LAB — CBC
HCT: 34.9 % — ABNORMAL LOW (ref 36.0–46.0)
Hemoglobin: 11.2 g/dL — ABNORMAL LOW (ref 12.0–15.0)
MCH: 30 pg (ref 26.0–34.0)
MCHC: 32.1 g/dL (ref 30.0–36.0)
MCV: 93.6 fL (ref 80.0–100.0)
Platelets: 214 10*3/uL (ref 150–400)
RBC: 3.73 MIL/uL — ABNORMAL LOW (ref 3.87–5.11)
RDW: 13.4 % (ref 11.5–15.5)
WBC: 6.8 10*3/uL (ref 4.0–10.5)
nRBC: 0 % (ref 0.0–0.2)

## 2021-12-22 LAB — BASIC METABOLIC PANEL
Anion gap: 10 (ref 5–15)
BUN: 32 mg/dL — ABNORMAL HIGH (ref 6–20)
CO2: 19 mmol/L — ABNORMAL LOW (ref 22–32)
Calcium: 10.1 mg/dL (ref 8.9–10.3)
Chloride: 109 mmol/L (ref 98–111)
Creatinine, Ser: 4.23 mg/dL — ABNORMAL HIGH (ref 0.44–1.00)
GFR, Estimated: 13 mL/min — ABNORMAL LOW (ref >60)
Glucose, Bld: 76 mg/dL (ref 70–99)
Potassium: 3.8 mmol/L (ref 3.5–5.1)
Sodium: 138 mmol/L (ref 135–145)

## 2021-12-22 LAB — CALCITRIOL (1,25 DI-OH VIT D): Vit D, 1,25-Dihydroxy: 62.7 pg/mL (ref 24.8–81.5)

## 2021-12-22 LAB — UREA NITROGEN, URINE: Urea Nitrogen, Ur: 160 mg/dL

## 2021-12-22 LAB — PARATHYROID HORMONE, INTACT (NO CA): PTH: 16 pg/mL (ref 15–65)

## 2021-12-22 MED ORDER — ASPIRIN-ACETAMINOPHEN-CAFFEINE 250-250-65 MG PO TABS: 1.0000 | ORAL_TABLET | Freq: Four times a day (QID) | ORAL | 0 refills | Status: DC | PRN

## 2021-12-22 MED ORDER — SEVELAMER CARBONATE 800 MG PO TABS: 1200.0000 mg | ORAL_TABLET | Freq: Three times a day (TID) | ORAL | 0 refills | Status: AC

## 2021-12-22 MED ORDER — BUTALBITAL-APAP-CAFFEINE 50-325-40 MG PO TABS: 1.0000 | ORAL_TABLET | Freq: Four times a day (QID) | ORAL | Status: DC | PRN

## 2021-12-22 MED ORDER — CINACALCET HCL 30 MG PO TABS
30.0000 mg | ORAL_TABLET | Freq: Every day | ORAL | 0 refills | Status: AC
Start: 2021-12-22 — End: 2022-01-21

## 2021-12-22 MED ORDER — ASPIRIN-ACETAMINOPHEN-CAFFEINE 250-250-65 MG PO TABS
1.0000 | ORAL_TABLET | Freq: Four times a day (QID) | ORAL | Status: DC | PRN
  Administered 2021-12-22: 1 via ORAL
  Filled 2021-12-22 (×2): qty 1

## 2021-12-22 NOTE — Progress Notes (Signed)
[facility] ?Progress Note ? ?Subjective: seen in room. Good UOP, creat 4.2, no change. Ca 10.1 and phos 6.8 ? ?Vitals:  ? 12/22/21 0432 12/22/21 0524 12/22/21 0700 12/22/21 0810  ?BP: (!) 158/111 (!) 157/96  (!) 152/97  ?Pulse: 81   90  ?Resp: 18     ?Temp: 97.7 ?F (36.5 ?C)     ?TempSrc: Oral     ?SpO2: 96%   97%  ?Weight:   77.3 kg   ?Height:      ? ? ?Exam: ?Gen alert, no distress ?No jvd or bruits ?Chest clear bilat to bases ?RRR no MRG ?Abd soft ntnd no mass or ascites +bs ?Ext no LE edema ?Old nonfunctioning AVF/ AVG's in LUE ?Neuro is alert, Ox 3 , nf ?  ? Home meds include - zyloprim, xanax, rocaltrol 2 ug qd, coreg 25 bid, pepcid, lasix 40 prn, imdur 30, provera, myfortic 360 bid, pred 5 qd, zoloft, zocor, sof bicarb 1300 hs, prograf 1 mg bid, trazodone 100 hs, prns/ vits/ supps ?  ?     Date                       Creat               eGFR ?    2019                        1.8- 2.1                                                 ?    2021                        2.59- 2.80        20- 22 ml/min, stage IV                           ?    11/16/21                    3.82                 14 ?    4/19                         4.54                 12 ?    4/20                         4.29                              ?                                     ?    4/19 UA - 30 prot, 0-5 rbc/ wbc ?    UNa 12,  UCr 48 ?     Renal US transplant - 13.8 cm RLQ, marked ^echogenicity, no hydro, patent renal a/v, normal resistive indices ?  Ca++ 11.8 > 10.1 >  today ?     Phos 7.5 > 6.2 >  today ?     Creat 4.5 > 4.2 >  today  ?  ?Assessment/ Plan: ?AKI on CKD 5 transplant - b/l creat 3.82 from march 2023, egfr 20-22 ml/min. Creat here 4.5 on admit and was down to 4.2 yest and is 4.2 again today. Good UOP, no uremic sx's. Hypercalcemia resolved and any vol deficit probably treated. May have new higher baseline creatinine.  Will f/u w/ Dr Hollie Salk in 2-4 wks. OK for discharge.  ?Hypercalcemia - holding home rocaltrol  dose, PTH and vit D 1,25 levels pending. 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 She sees GYN every year in f/u of her in situ carcinoma, next appt is in May. 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 Hx of parathyroidectomy early 2000's, but now runs very high PTH in OP setting: > 3000.  Suspect hyperCa++ secondary to vit D supplementation and probable tertiary hyperparathyroidism. Ca is stable in 10- 11 range. Will need to keep off vdra for now. Have d/w Dr Harlow Asa who recommended outpt sestamibi scan (w/ attention to he neck and R forearm especially) and said he would be happy to see her in consultation for hyperparathyroidism. ?Hyperphosphatemia - likely related to loss of kidney function. Pt agreeable to renvela 2 ac. Phos down to 6.8.  ?Renal transplant - cont home IS meds , pred 5 qd / prograf 1 mg bid / myfortic 360 bid.  ?HTN - cont coreg ?HL - on statin ?Anxiety/ depression - trazodone, prn xanax, zoloft ?Gout - on zyloprim low dose ?  ?  ? ? ? ? ?Rob Doctor, hospital ?12/22/2021, 10:22 AM ? ? ?Recent Labs  ?Lab 12/20/21 ?2015 12/21/21 ?2094 12/22/21 ?0542  ?HGB 12.1 10.2* 11.2*  ?ALBUMIN 3.9 3.2*  --   ?CALCIUM 11.8* 10.1 10.1  ?PHOS 7.5* 6.2*  --   ?CREATININE 4.54* 4.29* 4.23*  ?K 4.3 3.9 3.8  ? ? ?Inpatient medications: ? allopurinol  50 mg Oral Q48H  ? ALPRAZolam  0.5 mg Oral QHS  ? carvedilol  25 mg Oral BID WC  ? cinacalcet  30 mg Oral Q supper  ? famotidine  10 mg Oral Q48H  ? heparin  5,000 Units Subcutaneous Q8H  ? isosorbide mononitrate  30 mg Oral q morning  ? magnesium gluconate  500 mg Oral QPM  ? medroxyPROGESTERone  10 mg Oral Daily  ? mycophenolate  360 mg Oral BID  ? predniSONE  5 mg Oral Daily  ? sertraline  50 mg Oral Daily  ? sevelamer carbonate  1,200 mg Oral TID WC  ? simvastatin  5 mg Oral QHS  ? sodium bicarbonate  1,300 mg Oral QPM  ? tacrolimus   0.5 mg Oral BID  ? tacrolimus  1 mg Oral BID  ? traZODone  100 mg Oral QHS  ? ? ?acetaminophen **OR** acetaminophen, aspirin-acetaminophen-caffeine, hydrALAZINE, hydrOXYzine, ondansetron **OR** ondansetron (ZOFRAN) IV, polyethylene glycol ? ? ? ? ? ? ?

## 2021-12-22 NOTE — Discharge Summary (Signed)
Physician Discharge Summary  ?Ariel [name] CXK:[id number] DOB: 07/31/1976 DOA: 12/20/2021 ? ?PCP: [name], PA-C ? ?Admit date: 12/20/2021 ?Discharge date: 12/22/2021 ?Recommendations for Outpatient Follow-up:  ?Follow up with PCP in 1 weeks-call for appointment ?Please obtain BMP/CBC in one week ? ?Discharge Dispo: hom ?Discharge Condition: Stable ?Code Status:   Code Status: Full Code ?Diet recommendation:  ?Diet Order   ? ?       ?  Diet renal with fluid restriction Room service appropriate? Yes; Fluid consistency: Thin  Diet effective now       ?  ? ?  ?  ? ?  ?  ? 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 Hollie Salk presents with malaise and weakness sent to the ED by nephrology due to worsening electrolytes including hyperphosphatemia and hypercalcemia as well as worsening renal function.  ? Of note, patient's creatinine was 3.82 on 3/16 with a GFR of 14.  This is up from a creatinine of 3.0 identified in early February 2023 at [facility].Patient admitted for AKI on CKD stage V, secondary hyperparathyroidism, Dr. [name] was consulted.,  Started on cinacalcet and Renvela as well as intravenous fluids to address hyperphosphatemia and hypercalcemia.  At this time calcium has improved, PTH pending.  Creatinine is stable.  Patient will continue on her current medication Rocaltrol discontinued, continue Renvela, sensipar.  Patient will need follow-up with nephrology PCP outpatient. Stable for discahrge home ?  ? ?Discharge Diagnoses:  ?Principal Problem: ?  Acute renal failure superimposed on stage 5 chronic kidney disease, not on chronic dialysis ([place]) ?Active Problems: ?  Secondary hyperparathyroidism of renal origin ([name]) ?  Hypercalcemia ?  Hyperphosphatemia ?  Failed kidney transplant ?  Essential hypertension ?  Mixed hyperlipidemia ?  Anxiety disorder ?  GERD without esophagitis ?  Gout ?   Hypomagnesemia ?  Obesity, Class I, BMI 30-34.9 ? ? 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  AKI in the setting of new hypercalcemia/hyperphosphatemia no urinary signs and symptoms.  Creatinine level is stable and better, nephrology has cleared the patient for discharge home with outpatient follow-up.Ultrasound Doppler renal imaging reviewed as below.  Continue oral bicarb.   ? ?Hypercalcemia ?Hyperparathyroidism: ?History of parathyroidectomy in early 2009 with high PTH outpatient more than 3000 question per surgery hyperparathyroidism.  Holding home Rocaltrol.  Follow-up intact PTH and calcium.  Level improving.  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  Again holding Rocaltrol patient will continue Renvela, and Sensipar at home and follow-up with nephrology and PCP as outpatient ? ?S/P renal transplant failed:Pt on  tacrolimus, mycophenolate and low-dose prednisone.  Continue bicarb. ?Ultrasound Doppler renal-"loculated postoperative changes diminished in size considerably when compared to imaging dating back to December of 2022. Marked increased cortical echogenicity could be seen in the setting of transplant rejection or drug toxicity. Patent transplant artery and vein with normal resistive indices. RIGHT renal cyst" ?  ?Hypomagnesemia repleted.  Recheck.[name] ?Essential hypertension: Well-controlled on home Coreg 25, Imdur, simvastatin. Cont prns  ?Anxiety disorder stable on Xanax, Zoloft, trazodone  ?HLD cont simvastatin ?GERD on H2 blocker ?Gout on allopurinol ?Class I Obesity:Patient's Body mass index  is 34.42 kg/m?. : Will benefit with PCP follow-up, weight loss  healthy lifestyle and outpatient sleep evaluation ? ? ?  Consults: ?Nephrology ?Subjective: ?Alert awake oriented some headache this morning but improved with Excedrin. ? ?Discharge Exam: ?Vitals:  ? 12/22/21 0524 12/22/21 0810  ?BP: (!) 157/96 (!) 152/97  ?Pulse:  90  ?Resp:    ?Temp:    ?SpO2:  97%  ? ?General: Pt is alert, awake, not in acute distress ?Cardiovascular: RRR, S1/S2 +, no rubs, no gallops ?Respiratory: CTA bilaterally, no wheezing, no rhonchi ?Abdominal: Soft, NT, ND, bowel sounds + ?Extremities: no edema, no cyanosis ? ?Discharge Instructions ? ?Discharge Instructions   ? ? Discharge instructions   Complete by: As directed ?  ? Follow-up with PCP and nephrology, check your renal function BMP in 5 to 7 days ? ?Please call call MD or return to ER for similar or worsening recurring problem that brought you to hospital or if any fever,nausea/vomiting,abdominal pain, uncontrolled pain, chest pain,  shortness of breath or any other alarming symptoms. ? ?Please follow-up your doctor as instructed in a week time and call the office for appointment. ? ?Please avoid alcohol, smoking, or any other illicit substance and maintain healthy habits including taking your regular medications as prescribed. ? ?You were cared for by a hospitalist during your hospital stay. If you have any questions about your discharge medications or the care you received while you were in the hospital after you are discharged, you can call the unit and ask to speak with the hospitalist on call if the hospitalist that took care of you is not available. ? ?Once you are discharged, your primary care physician will handle any further medical issues. Please note that NO REFILLS for any discharge medications will be authorized once you are discharged, as it is imperative that you return to your primary care physician (or establish a relationship with a primary care physician if you do not have one) for your aftercare needs so that they can reassess your need for medications and monitor your lab  values  ? Increase activity slowly   Complete by: As directed ?  ? ?  ? ?Allergies as of 12/22/2021   ? ?   Reactions  ? Bactrim [sulfamethoxazole-trimethoprim] Anaphylaxis  ? Sulfamethoxazole Anaphylaxis  ? Hydrogen Peroxide Rash  ? Labetalol   ? Wound Dressing Adhesive Hives  ? Zolpidem   ? Amlodipine Swelling  ? Hyoscyamine Other (See Comments)  ? Dizziness  ? Keflex [cephalexin] Hives, Swelling  ? Per pt, her face and lips swell up and she develops hives  ? Adhesive [tape] Rash  ? Imuran [azathioprine Sodium] Rash  ? Neosporin [bacitracin-polymyxin B] Rash, Other (See Comments)  ? Can use that for awhile, but will eventually develop a rash   ? Tramadol Rash  ? Warfarin Sodium Rash  ? ?  ? ?  ?Medication List  ?  ? ?STOP taking these medications   ? ?calcitRIOL 0.5 MCG capsule ?Commonly known as: ROCALTROL ?  ? ?  ? ?TAKE these medications   ? ?acetaminophen 500 MG tablet ?Commonly known as: TYLENOL ?Take 500 mg by mouth every 6 (six) hours as needed for headache (pain). ?  ?allopurinol 100 MG tablet ?Commonly known as: ZYLOPRIM ?TAKE 1 TABLET(100 MG) BY MOUTH DAILY ?  ?ALPRAZolam 0.5 MG tablet ?Commonly known as: Duanne Moron ?Take 0.5 mg by mouth at bedtime. ?  ?aspirin-acetaminophen-caffeine 250-250-65 MG tablet ?Commonly known as: [place] ?Take 1 tablet by mouth  every 6 (six) hours as needed for headache. ?  ?carvedilol 25 MG tablet ?Commonly known as: COREG ?Take 25 mg by mouth 2 (two) times daily with a meal. ?  ?cinacalcet 30 MG tablet ?Commonly known as: SENSIPAR ?Take 1 tablet (30 mg total) by mouth daily with supper. ?  ?famotidine 20 MG tablet ?Commonly known as: PEPCID ?Take 1 tablet (20 mg total) by mouth daily. ?  ?furosemide 40 MG tablet ?Commonly known as: LASIX ?Take 40 mg by mouth daily as needed for fluid or edema. ?  ?hydrOXYzine 25 MG tablet ?Commonly known as: ATARAX ?Take 25-50 mg by mouth 2 (two) times daily as needed for dizziness or anxiety. ?  ?isosorbide mononitrate 30 MG 24 hr  tablet ?Commonly known as: IMDUR ?Take 1 tablet (30 mg total) by mouth every morning. ?  ?magnesium gluconate 500 MG tablet ?Commonly known as: MAGONATE ?Take 500 mg by mouth every evening. ?  ?medroxyPROGESTERone 10 MG tablet ?C

## 2021-12-22 NOTE — Progress Notes (Signed)
Patient being discharged home, father is patients ride. Discharge instructions reviewed including medications. Pt verbalized full understanding.  ?

## 2021-12-25 LAB — PROTEIN ELECTROPHORESIS, SERUM
A/G Ratio: 1.2 (ref 0.7–1.7)
Albumin ELP: 3.1 g/dL (ref 2.9–4.4)
Alpha-1-Globulin: 0.2 g/dL (ref 0.0–0.4)
Alpha-2-Globulin: 1 g/dL (ref 0.4–1.0)
Beta Globulin: 0.7 g/dL (ref 0.7–1.3)
Gamma Globulin: 0.7 g/dL (ref 0.4–1.8)
Globulin, Total: 2.6 g/dL (ref 2.2–3.9)
Total Protein ELP: 5.7 g/dL — ABNORMAL LOW (ref 6.0–8.5)

## 2021-12-25 LAB — KAPPA/LAMBDA LIGHT CHAINS
Kappa free light chain: 36 mg/L — ABNORMAL HIGH (ref 3.3–19.4)
Kappa, lambda light chain ratio: 1.23 (ref 0.26–1.65)
Lambda free light chains: 29.3 mg/L — ABNORMAL HIGH (ref 5.7–26.3)

## 2021-12-26 ENCOUNTER — Telehealth: Payer: Self-pay | Admitting: Gastroenterology

## 2021-12-26 NOTE — Telephone Encounter (Signed)
Dr. Lyndel Safe Pt ?Pt states that she recently was admitted to the Hospital for acute kidney injury: Chart reviewed: ?Pt is questioning if she can proceed with EGD/Colon with Dr. Lyndel Safe on 01/11/2022  ?Please advise as DOD ?

## 2021-12-26 NOTE — Telephone Encounter (Signed)
Patient called, states she was seen in the ED 12/20/21 for acute kidney injury. Patient is scheduled to have an EGD and colonoscopy 01/11/22. Patient wants to know if she can still have those procedures done? Please advise. ?

## 2021-12-27 NOTE — Telephone Encounter (Signed)
Pt was made aware of Dr. Lorenso Courier recommendations: ?Pt stated that she has an appointment to see her nephrologist next week: Pt was notified to reach back out to our office after Visit with Nephrologist: ?Pt verbalized understanding with all questions answered.  ? ?

## 2021-12-28 DIAGNOSIS — H61303 Acquired stenosis of external ear canal, unspecified, bilateral: Secondary | ICD-10-CM | POA: Diagnosis not present

## 2021-12-28 DIAGNOSIS — H903 Sensorineural hearing loss, bilateral: Secondary | ICD-10-CM | POA: Diagnosis not present

## 2021-12-28 DIAGNOSIS — Z8669 Personal history of other diseases of the nervous system and sense organs: Secondary | ICD-10-CM | POA: Diagnosis not present

## 2021-12-29 ENCOUNTER — Telehealth: Payer: Self-pay | Admitting: Cardiology

## 2021-12-29 NOTE — Telephone Encounter (Signed)
Called pt mother ok per DPR.  Mother reports concerned that pt BP is elevated.  Pt not with mother.  Mother does not have assessment information.  Advised mother that will need information from pt.  Called pt left a message to call back.  ?

## 2021-12-29 NOTE — Telephone Encounter (Signed)
Pt c/o BP issue: STAT if pt c/o blurred vision, one-sided weakness or slurred speech ? ?1. What are your last 5 BP readings?  ?12/28/21: 180/103 (approximate reading) ?12/27/21: 152/97 ? ?2. Are you having any other symptoms (ex. Dizziness, headache, blurred vision, passed out)? Not sure ? ?3. What is your BP issue? Mother is worried about her BP being high. The patient is on a waiting list to get a Kidney transplant  ? ?Mother is not with the patient at the time of the call  ?

## 2022-01-01 NOTE — Telephone Encounter (Signed)
Left vm for pt to callback 

## 2022-01-02 DIAGNOSIS — Z940 Kidney transplant status: Secondary | ICD-10-CM | POA: Diagnosis not present

## 2022-01-02 DIAGNOSIS — N390 Urinary tract infection, site not specified: Secondary | ICD-10-CM | POA: Diagnosis not present

## 2022-01-02 NOTE — Telephone Encounter (Signed)
Left vm for pt to callback 

## 2022-01-05 ENCOUNTER — Other Ambulatory Visit: Payer: Self-pay | Admitting: Nephrology

## 2022-01-05 ENCOUNTER — Other Ambulatory Visit (HOSPITAL_COMMUNITY): Payer: Self-pay | Admitting: Nephrology

## 2022-01-05 DIAGNOSIS — N2581 Secondary hyperparathyroidism of renal origin: Secondary | ICD-10-CM

## 2022-01-09 ENCOUNTER — Telehealth: Payer: Self-pay | Admitting: Gastroenterology

## 2022-01-09 DIAGNOSIS — Q615 Medullary cystic kidney: Secondary | ICD-10-CM | POA: Insufficient documentation

## 2022-01-09 DIAGNOSIS — Z01818 Encounter for other preprocedural examination: Secondary | ICD-10-CM | POA: Insufficient documentation

## 2022-01-09 DIAGNOSIS — M19011 Primary osteoarthritis, right shoulder: Secondary | ICD-10-CM | POA: Diagnosis not present

## 2022-01-09 HISTORY — DX: Medullary cystic kidney: Q61.5

## 2022-01-09 HISTORY — DX: Encounter for other preprocedural examination: Z01.818

## 2022-01-09 NOTE — Telephone Encounter (Signed)
Patients mother called to reschedule procedure due to scheduling conflict from 19/50/93 to 02/28/22 said she has to see her liver doctor. ?

## 2022-01-11 ENCOUNTER — Encounter: Payer: Medicare Other | Admitting: Gastroenterology

## 2022-01-11 DIAGNOSIS — N390 Urinary tract infection, site not specified: Secondary | ICD-10-CM | POA: Diagnosis not present

## 2022-01-11 DIAGNOSIS — N189 Chronic kidney disease, unspecified: Secondary | ICD-10-CM | POA: Diagnosis not present

## 2022-01-11 DIAGNOSIS — E872 Acidosis, unspecified: Secondary | ICD-10-CM | POA: Diagnosis not present

## 2022-01-11 DIAGNOSIS — I10 Essential (primary) hypertension: Secondary | ICD-10-CM | POA: Diagnosis not present

## 2022-01-11 DIAGNOSIS — D631 Anemia in chronic kidney disease: Secondary | ICD-10-CM | POA: Diagnosis not present

## 2022-01-11 DIAGNOSIS — Z940 Kidney transplant status: Secondary | ICD-10-CM | POA: Diagnosis not present

## 2022-01-11 DIAGNOSIS — N2581 Secondary hyperparathyroidism of renal origin: Secondary | ICD-10-CM | POA: Diagnosis not present

## 2022-01-15 DIAGNOSIS — M5030 Other cervical disc degeneration, unspecified cervical region: Secondary | ICD-10-CM | POA: Diagnosis not present

## 2022-01-15 DIAGNOSIS — M25511 Pain in right shoulder: Secondary | ICD-10-CM | POA: Diagnosis not present

## 2022-01-15 DIAGNOSIS — M67911 Unspecified disorder of synovium and tendon, right shoulder: Secondary | ICD-10-CM | POA: Diagnosis not present

## 2022-01-24 DIAGNOSIS — N179 Acute kidney failure, unspecified: Secondary | ICD-10-CM | POA: Diagnosis not present

## 2022-01-24 DIAGNOSIS — N390 Urinary tract infection, site not specified: Secondary | ICD-10-CM | POA: Diagnosis not present

## 2022-02-06 DIAGNOSIS — F419 Anxiety disorder, unspecified: Secondary | ICD-10-CM | POA: Diagnosis not present

## 2022-02-06 DIAGNOSIS — N184 Chronic kidney disease, stage 4 (severe): Secondary | ICD-10-CM | POA: Diagnosis not present

## 2022-02-06 DIAGNOSIS — I700 Atherosclerosis of aorta: Secondary | ICD-10-CM | POA: Diagnosis not present

## 2022-02-06 DIAGNOSIS — G4700 Insomnia, unspecified: Secondary | ICD-10-CM | POA: Diagnosis not present

## 2022-02-06 DIAGNOSIS — Z6833 Body mass index (BMI) 33.0-33.9, adult: Secondary | ICD-10-CM | POA: Diagnosis not present

## 2022-02-07 ENCOUNTER — Encounter: Payer: Self-pay | Admitting: Podiatrist

## 2022-02-07 ENCOUNTER — Ambulatory Visit (INDEPENDENT_AMBULATORY_CARE_PROVIDER_SITE_OTHER): Payer: Medicare Other | Admitting: Podiatrist

## 2022-02-07 ENCOUNTER — Ambulatory Visit: Payer: Medicare Other

## 2022-02-07 DIAGNOSIS — M25571 Pain in right ankle and joints of right foot: Secondary | ICD-10-CM | POA: Diagnosis not present

## 2022-02-07 DIAGNOSIS — M778 Other enthesopathies, not elsewhere classified: Secondary | ICD-10-CM

## 2022-02-07 DIAGNOSIS — M7750 Other enthesopathy of unspecified foot: Secondary | ICD-10-CM

## 2022-02-07 MED ORDER — TRIAMCINOLONE ACETONIDE 10 MG/ML IJ SUSP
10.0000 mg | Freq: Once | INTRAMUSCULAR | Status: AC
  Administered 2022-02-07: 10 mg

## 2022-02-07 NOTE — Progress Notes (Signed)
Chief Complaint  Patient presents with   Foot Pain    I seen Dr March Rummage before on the right ankle area and was in a boot and it did help and there is some coldness      HPI: Patient is 46 y.o. female who presents today for pain in the right ankle.  She had seen Dr. March Rummage in the past for a similar problem and states that he has put her in a boot.  She states that that the boot did help for a little bit of time.  She relates pain with walking especially on the dorsal lateral aspect of the right foot/ankle.  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 11/03/2021   Chronic kidney disease 11/03/2021   Condyloma of female genitalia 11/03/2021   Depression 11/03/2021   Dialysis patient ([place]) 11/03/2021   Dysuria 11/03/2021   ESRD (end stage renal disease) ([place]) 11/03/2021   Essential hypertension 11/03/2021   Fatigue 11/03/2021   Gastritis, acute 11/03/2021   Gout 11/03/2021   History of renal transplant 11/03/2021   Hypercalcemia 11/03/2021   Hypercholesterolemia 11/03/2021   Hypokalemia 11/03/2021   Hypomagnesemia 11/03/2021   IBS (irritable bowel syndrome) 11/03/2021   Leg edema 85/10/7739   Metabolic acidosis 28/78/6767   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   Palpitations 11/03/2021   Pancreatitis 11/03/2021   Renal failure, chronic 11/03/2021   [facility] spotted fever 11/03/2021   Secondary hyperparathyroidism of renal origin ([place]) 11/03/2021   Squamous cell  carcinoma of skin of scalp and neck 11/03/2021   Urinary tract infection 11/03/2021   Vulvar dystrophy 11/03/2021   AKI (acute kidney injury) ([place]) 08/18/2021   Anorectal disorder 06/08/2020   Anorectal pain 06/08/2020   Kidney disorder 06/08/2020   AIN grade III 04/05/2020   Dehydration 01/19/2019   Nausea and vomiting 01/19/2019   Anemia 01/19/2019   Drug-induced bleeding disorder ([place]) 01/19/2019   Dyspnea on exertion 01/19/2019   HTN (hypertension) 01/19/2019   Hypothyroid 01/19/2019   Murmur 01/19/2019   Unstable angina ([place]) 01/19/2019   Anxiety disorder 10/29/2018   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   History of parathyroidectomy ([place]) 10/29/2018   Immunosuppressive management encounter following kidney transplant 10/29/2018   Other chronic sinusitis 10/29/2018   Right lower quadrant abdominal pain 10/29/2018   Secondary hypertension due to renal disease 10/29/2018   Transplanted organ removal status 10/29/2018   Yeast dermatitis 08/14/2018   Plantar fasciitis 07/03/2018   Sesamoiditis 07/03/2018   AIN grade II 06/26/2018   Pruritus ani 06/26/2018   Internal hemorrhoid 04/22/2018   Perianal lesion 04/22/2018   Abdominal distension (gaseous) 04/16/2018   Bloating 04/16/2018   Irregular uterine bleeding 02/13/2016   Dysmenorrhea 10/19/2015   Iron deficiency anemia 10/19/2015   Mechanical complication of other vascular device, implant, and graft 09/02/2012   End stage renal disease ([place]) 20/94/7096   Complications due to cardiac device, implant, and graft 09/02/2012   DVT of axillary vein, acute left ([place]) 08/02/2012   S/p cadaver renal transplant 08/02/2012  Mixed hyperlipidemia 08/02/2012   Phlebitis and thrombophlebitis of other sites 08/02/2012    Current Outpatient Medications on File Prior to Visit  Medication Sig Dispense Refill   allopurinol (ZYLOPRIM) 100 MG tablet Take by mouth.     potassium chloride SA (KLOR-CON M) 20 MEQ tablet Take by mouth.      sevelamer carbonate (RENVELA) 800 MG tablet Take by mouth.     simvastatin (ZOCOR) 5 MG tablet Take by mouth.     acetaminophen (TYLENOL) 500 MG tablet Take 500 mg by mouth every 6 (six) hours as needed for headache (pain).     ALPRAZolam (XANAX) 0.5 MG tablet Take 0.5 mg by mouth at bedtime.   1   aspirin-acetaminophen-caffeine (EXCEDRIN MIGRAINE) 250-250-65 MG tablet Take 1 tablet by mouth every 6 (six) hours as needed for headache. 30 tablet 0   carvedilol (COREG) 25 MG tablet Take 25 mg by mouth 2 (two) times daily with a meal.     famotidine (PEPCID) 20 MG tablet Take 1 tablet (20 mg total) by mouth daily. 90 tablet 3   furosemide (LASIX) 40 MG tablet Take 40 mg by mouth daily as needed for fluid or edema.      hydrOXYzine (ATARAX) 25 MG tablet Take 25-50 mg by mouth 2 (two) times daily as needed for dizziness or anxiety.     isosorbide mononitrate (IMDUR) 30 MG 24 hr tablet Take 1 tablet (30 mg total) by mouth every morning. 90 tablet 3   Magnesium 200 MG TABS Take by mouth.     magnesium gluconate (MAGONATE) 500 MG tablet Take 500 mg by mouth every evening.     medroxyPROGESTERone (PROVERA) 10 MG tablet Take 10 mg by mouth daily.  3   mycophenolate (MYFORTIC) 360 MG TBEC EC tablet Take 360 mg by mouth 2 (two) times daily.     ondansetron (ZOFRAN-ODT) 4 MG disintegrating tablet Take 4 mg by mouth as needed for nausea/vomiting.     predniSONE (DELTASONE) 5 MG tablet Take 5 mg by mouth daily.       sertraline (ZOLOFT) 50 MG tablet Take 50 mg by mouth daily.     sodium bicarbonate 650 MG tablet Take 1,300 mg by mouth every evening.     tacrolimus (PROGRAF) 0.5 MG capsule Take 0.5 mg by mouth 2 (two) times daily. Take along with 1 mg capsule=1.5 mg BID     tacrolimus (PROGRAF) 1 MG capsule Take 1 mg by mouth 2 (two) times daily. Take along with 0.5 mg capsule=1.5 mg BID  6   traZODone (DESYREL) 100 MG tablet Take 100 mg by mouth at bedtime.     [DISCONTINUED] Colchicine 0.6 MG CAPS Take 1  capsule by mouth daily.     No current facility-administered medications on file prior to visit.    Allergies  Allergen Reactions   Bactrim [Sulfamethoxazole-Trimethoprim] Anaphylaxis   Sulfamethoxazole Anaphylaxis   Hydrogen Peroxide Rash   Labetalol    Wound Dressing Adhesive Hives   Zolpidem    Amlodipine Swelling   Hyoscyamine Other (See Comments)    Dizziness    Keflex [Cephalexin] Hives and Swelling    Per pt, her face and lips swell up and she develops hives   Adhesive [Tape] Rash   Imuran [Azathioprine Sodium] Rash   Neosporin [Bacitracin-Polymyxin B] Rash and Other (See Comments)    Can use that for awhile, but will eventually develop a rash    Tramadol Rash   Warfarin Sodium Rash    Review  of Systems No fevers, chills, nausea, muscle aches, no difficulty breathing, no calf pain, no chest pain or shortness of breath.   Physical Exam  GENERAL APPEARANCE: Alert, conversant. Appropriately groomed. No acute distress.   VASCULAR: Pedal pulses palpable DP and PT bilateral.  Capillary refill time is immediate to all digits,  Proximal to distal cooling it warm to warm.  Digital perfusion adequate.   NEUROLOGIC: sensation is intact to 5.07 monofilament at 5/5 sites bilateral.  Light touch is intact bilateral, vibratory sensation intact bilateral  MUSCULOSKELETAL: acceptable muscle strength, tone and stability bilateral.  Pain on palpation at the sinus tarsi of the right foot/ankle is noted.  No pain along the lateral ankle ligamentous structures is noted.  No pain medially is noted.  DERMATOLOGIC: skin is warm, supple, and dry.  Color, texture, and turgor of skin within normal limits.  No open wounds are noted.  No preulcerative lesions are seen.  Digital nails are asymptomatic.      Assessment     ICD-10-CM   1. Capsulitis of foot, right  M77.8 CANCELED: DG Ankle Complete Right    2. Sinus tarsi syndrome of right foot  M25.571        Plan  Discussed exam  findings with the patient.  I recommended a steroid injection into the sinus tarsi of which she agreed I prepped the skin with alcohol infiltrated 10 mg of Kenalog with Marcaine mixture in a sterile fashion to the sinus tarsi region of the right foot.  She tolerated this well.  I also recommended she wear the boot for the next couple of days.  She will see how this does and call in the next 2 weeks if there is little benefit with the injection.  At that time would recommend physical therapy for her.

## 2022-02-07 NOTE — Patient Instructions (Signed)
  What is the Sinus Tarsi Syndrome?  Definition:  Clinical disorder characterized by specific symptoms and signs localized to the sinus tarsi (known as the "eye of the foot"), which refers to an opening on the outside of the foot between the ankle and heel bone.  History:  First described by Ammie Dalton in [phone]. He also described a surgical procedure to address this problem (called the Ray[place] Go procedure) that involves removal of all or a portion of the contents of the sinus tarsi.  Etiology:  Cause can be due to an inversion (rolling out) ankle sprain (70-80% of the time) or can be due to a "pinching" or impingement of the soft tissues in the sinus tarsi due to a very pronated (rolling in) foot (20-30% of the time).  Clinical Presentation:  Patients present with localize pain to the sinus tarsi region with a feeling of instability and aggravation by weight bearing activity. These patients do poorly on uneven surfaces, i.e., grass and gravel. Physical examination reveals pain to palpation of the sinus tarsi with aggravation on foot inversion (turning in) or eversion (turning out). Looseness and instability of the ankle and foot joints may be present as well.  Diagnostic Testing:  May include x-rays, bone scan, CT scan and MRI evaluation. Injection with local anesthetic is diagnostic for localizing this problem to the sinus tarsi. Many times this is a diagnosis make by excluding other common problems in the foot as definitive diagnostic findings are rarely present. MRI is probably the one best test to shoe changes in the tissues of the sinus tarsi involving either inflammation or scar tissue from previous injury. Ankle arthroscopy may also be beneficial to directly evaluate the sinus for damaged tissue.  Treatment:  After a diagnosis is established conservative treatment can be initiated which is generally very effective in eliminating the problem. Treatment may include anti-inflammatories,  stable shoes, period of immobilization, ankle sleeve and over-the-counter orthoses. Resistant cases may require a course of oral steroids, series of steroid injectionss, physical therapy or custom orthoses. Rarely is surgery indicated and if needed open surgery (through an incision) or closed surgery (via arthroscopy) can be considered. Excellent results should be expected but surgery is not a panacea and should be considered as a last resort.

## 2022-02-14 ENCOUNTER — Emergency Department (HOSPITAL_COMMUNITY): Payer: Medicare Other

## 2022-02-14 ENCOUNTER — Inpatient Hospital Stay (HOSPITAL_COMMUNITY)
Admission: EM | Admit: 2022-02-14 | Discharge: 2022-02-17 | DRG: 438 | Disposition: A | Discharge status: Home / Self Care | Payer: Medicare Other | Attending: Internal Medicine | Admitting: Internal Medicine

## 2022-02-14 ENCOUNTER — Other Ambulatory Visit: Payer: Self-pay

## 2022-02-14 ENCOUNTER — Encounter (HOSPITAL_COMMUNITY): Payer: Self-pay | Admitting: Emergency Medicine

## 2022-02-14 DIAGNOSIS — M722 Plantar fascial fibromatosis: Secondary | ICD-10-CM | POA: Diagnosis present

## 2022-02-14 DIAGNOSIS — F419 Anxiety disorder, unspecified: Secondary | ICD-10-CM | POA: Diagnosis present

## 2022-02-14 DIAGNOSIS — Z882 Allergy status to sulfonamides status: Secondary | ICD-10-CM

## 2022-02-14 DIAGNOSIS — Z85828 Personal history of other malignant neoplasm of skin: Secondary | ICD-10-CM

## 2022-02-14 DIAGNOSIS — D84821 Immunodeficiency due to drugs: Secondary | ICD-10-CM | POA: Diagnosis present

## 2022-02-14 DIAGNOSIS — R933 Abnormal findings on diagnostic imaging of other parts of digestive tract: Secondary | ICD-10-CM | POA: Diagnosis not present

## 2022-02-14 DIAGNOSIS — N186 End stage renal disease: Secondary | ICD-10-CM | POA: Diagnosis present

## 2022-02-14 DIAGNOSIS — K219 Gastro-esophageal reflux disease without esophagitis: Secondary | ICD-10-CM | POA: Diagnosis not present

## 2022-02-14 DIAGNOSIS — R109 Unspecified abdominal pain: Secondary | ICD-10-CM | POA: Diagnosis present

## 2022-02-14 DIAGNOSIS — Z79899 Other long term (current) drug therapy: Secondary | ICD-10-CM

## 2022-02-14 DIAGNOSIS — K828 Other specified diseases of gallbladder: Secondary | ICD-10-CM | POA: Diagnosis not present

## 2022-02-14 DIAGNOSIS — N261 Atrophy of kidney (terminal): Secondary | ICD-10-CM | POA: Diagnosis not present

## 2022-02-14 DIAGNOSIS — Y830 Surgical operation with transplant of whole organ as the cause of abnormal reaction of the patient, or of later complication, without mention of misadventure at the time of the procedure: Secondary | ICD-10-CM | POA: Diagnosis present

## 2022-02-14 DIAGNOSIS — K862 Cyst of pancreas: Secondary | ICD-10-CM | POA: Diagnosis not present

## 2022-02-14 DIAGNOSIS — Z86718 Personal history of other venous thrombosis and embolism: Secondary | ICD-10-CM

## 2022-02-14 DIAGNOSIS — T8611 Kidney transplant rejection: Secondary | ICD-10-CM | POA: Diagnosis present

## 2022-02-14 DIAGNOSIS — K8689 Other specified diseases of pancreas: Secondary | ICD-10-CM | POA: Diagnosis not present

## 2022-02-14 DIAGNOSIS — Z83438 Family history of other disorder of lipoprotein metabolism and other lipidemia: Secondary | ICD-10-CM | POA: Diagnosis not present

## 2022-02-14 DIAGNOSIS — N251 Nephrogenic diabetes insipidus: Secondary | ICD-10-CM | POA: Diagnosis present

## 2022-02-14 DIAGNOSIS — R0602 Shortness of breath: Secondary | ICD-10-CM | POA: Diagnosis not present

## 2022-02-14 DIAGNOSIS — K5730 Diverticulosis of large intestine without perforation or abscess without bleeding: Secondary | ICD-10-CM | POA: Diagnosis not present

## 2022-02-14 DIAGNOSIS — N2581 Secondary hyperparathyroidism of renal origin: Secondary | ICD-10-CM | POA: Diagnosis present

## 2022-02-14 DIAGNOSIS — Z992 Dependence on renal dialysis: Secondary | ICD-10-CM

## 2022-02-14 DIAGNOSIS — Z7960 Long term (current) use of unspecified immunomodulators and immunosuppressants: Secondary | ICD-10-CM

## 2022-02-14 DIAGNOSIS — K449 Diaphragmatic hernia without obstruction or gangrene: Secondary | ICD-10-CM | POA: Diagnosis present

## 2022-02-14 DIAGNOSIS — Z91048 Other nonmedicinal substance allergy status: Secondary | ICD-10-CM

## 2022-02-14 DIAGNOSIS — R935 Abnormal findings on diagnostic imaging of other abdominal regions, including retroperitoneum: Secondary | ICD-10-CM | POA: Diagnosis not present

## 2022-02-14 DIAGNOSIS — D631 Anemia in chronic kidney disease: Secondary | ICD-10-CM | POA: Diagnosis not present

## 2022-02-14 DIAGNOSIS — I120 Hypertensive chronic kidney disease with stage 5 chronic kidney disease or end stage renal disease: Secondary | ICD-10-CM | POA: Diagnosis not present

## 2022-02-14 DIAGNOSIS — I10 Essential (primary) hypertension: Secondary | ICD-10-CM | POA: Diagnosis present

## 2022-02-14 DIAGNOSIS — Z8249 Family history of ischemic heart disease and other diseases of the circulatory system: Secondary | ICD-10-CM | POA: Diagnosis not present

## 2022-02-14 DIAGNOSIS — I5032 Chronic diastolic (congestive) heart failure: Secondary | ICD-10-CM | POA: Diagnosis present

## 2022-02-14 DIAGNOSIS — J329 Chronic sinusitis, unspecified: Secondary | ICD-10-CM | POA: Diagnosis present

## 2022-02-14 DIAGNOSIS — F32A Depression, unspecified: Secondary | ICD-10-CM | POA: Diagnosis present

## 2022-02-14 DIAGNOSIS — K582 Mixed irritable bowel syndrome: Secondary | ICD-10-CM | POA: Diagnosis present

## 2022-02-14 DIAGNOSIS — T8612 Kidney transplant failure: Secondary | ICD-10-CM | POA: Diagnosis present

## 2022-02-14 DIAGNOSIS — N2889 Other specified disorders of kidney and ureter: Secondary | ICD-10-CM | POA: Diagnosis not present

## 2022-02-14 DIAGNOSIS — Z800 Family history of malignant neoplasm of digestive organs: Secondary | ICD-10-CM

## 2022-02-14 DIAGNOSIS — N185 Chronic kidney disease, stage 5: Secondary | ICD-10-CM | POA: Diagnosis present

## 2022-02-14 DIAGNOSIS — E892 Postprocedural hypoparathyroidism: Secondary | ICD-10-CM | POA: Diagnosis present

## 2022-02-14 DIAGNOSIS — Z86008 Personal history of in-situ neoplasm of other site: Secondary | ICD-10-CM

## 2022-02-14 DIAGNOSIS — Z7952 Long term (current) use of systemic steroids: Secondary | ICD-10-CM

## 2022-02-14 DIAGNOSIS — R300 Dysuria: Secondary | ICD-10-CM | POA: Diagnosis not present

## 2022-02-14 DIAGNOSIS — Z881 Allergy status to other antibiotic agents status: Secondary | ICD-10-CM

## 2022-02-14 DIAGNOSIS — N12 Tubulo-interstitial nephritis, not specified as acute or chronic: Secondary | ICD-10-CM | POA: Diagnosis present

## 2022-02-14 DIAGNOSIS — Z940 Kidney transplant status: Secondary | ICD-10-CM | POA: Diagnosis not present

## 2022-02-14 DIAGNOSIS — M109 Gout, unspecified: Secondary | ICD-10-CM | POA: Diagnosis not present

## 2022-02-14 DIAGNOSIS — E782 Mixed hyperlipidemia: Secondary | ICD-10-CM | POA: Diagnosis not present

## 2022-02-14 DIAGNOSIS — R1011 Right upper quadrant pain: Secondary | ICD-10-CM | POA: Diagnosis not present

## 2022-02-14 DIAGNOSIS — Z9851 Tubal ligation status: Secondary | ICD-10-CM

## 2022-02-14 DIAGNOSIS — I132 Hypertensive heart and chronic kidney disease with heart failure and with stage 5 chronic kidney disease, or end stage renal disease: Secondary | ICD-10-CM | POA: Diagnosis present

## 2022-02-14 DIAGNOSIS — Z888 Allergy status to other drugs, medicaments and biological substances status: Secondary | ICD-10-CM

## 2022-02-14 HISTORY — DX: Unspecified abdominal pain: R10.9

## 2022-02-14 LAB — HEPATIC FUNCTION PANEL
ALT: 10 U/L (ref 0–44)
AST: 17 U/L (ref 15–41)
Albumin: 3.7 g/dL (ref 3.5–5.0)
Alkaline Phosphatase: 43 U/L (ref 38–126)
Bilirubin, Direct: 0.1 mg/dL (ref 0.0–0.2)
Indirect Bilirubin: 0.4 mg/dL (ref 0.3–0.9)
Total Bilirubin: 0.5 mg/dL (ref 0.3–1.2)
Total Protein: 6.3 g/dL — ABNORMAL LOW (ref 6.5–8.1)

## 2022-02-14 LAB — BASIC METABOLIC PANEL
Anion gap: 8 (ref 5–15)
BUN: 40 mg/dL — ABNORMAL HIGH (ref 6–20)
CO2: 19 mmol/L — ABNORMAL LOW (ref 22–32)
Calcium: 8.1 mg/dL — ABNORMAL LOW (ref 8.9–10.3)
Chloride: 112 mmol/L — ABNORMAL HIGH (ref 98–111)
Creatinine, Ser: 3.9 mg/dL — ABNORMAL HIGH (ref 0.44–1.00)
GFR, Estimated: 14 mL/min — ABNORMAL LOW (ref >60)
Glucose, Bld: 106 mg/dL — ABNORMAL HIGH (ref 70–99)
Potassium: 4.4 mmol/L (ref 3.5–5.1)
Sodium: 139 mmol/L (ref 135–145)

## 2022-02-14 LAB — URINALYSIS, ROUTINE W REFLEX MICROSCOPIC
Bilirubin Urine: NEGATIVE
Glucose, UA: NEGATIVE mg/dL
Ketones, ur: NEGATIVE mg/dL
Nitrite: NEGATIVE
Protein, ur: 100 mg/dL — AB
Specific Gravity, Urine: 1.009 (ref 1.005–1.030)
pH: 6 (ref 5.0–8.0)

## 2022-02-14 LAB — CBC
HCT: 33.4 % — ABNORMAL LOW (ref 36.0–46.0)
Hemoglobin: 10.5 g/dL — ABNORMAL LOW (ref 12.0–15.0)
MCH: 29.6 pg (ref 26.0–34.0)
MCHC: 31.4 g/dL (ref 30.0–36.0)
MCV: 94.1 fL (ref 80.0–100.0)
Platelets: 198 10*3/uL (ref 150–400)
RBC: 3.55 MIL/uL — ABNORMAL LOW (ref 3.87–5.11)
RDW: 13.6 % (ref 11.5–15.5)
WBC: 14.7 10*3/uL — ABNORMAL HIGH (ref 4.0–10.5)
nRBC: 0 % (ref 0.0–0.2)

## 2022-02-14 LAB — I-STAT BETA HCG BLOOD, ED (MC, WL, AP ONLY): I-stat hCG, quantitative: <5 m[IU]/mL (ref <5)

## 2022-02-14 LAB — TROPONIN I (HIGH SENSITIVITY)
Troponin I (High Sensitivity): 8 ng/L (ref <18)
Troponin I (High Sensitivity): 7 ng/L (ref <18)

## 2022-02-14 LAB — D-DIMER, QUANTITATIVE: D-Dimer, Quant: 0.85 ug/mL-FEU — ABNORMAL HIGH (ref 0.00–0.50)

## 2022-02-14 LAB — LACTIC ACID, PLASMA: Lactic Acid, Venous: 1.3 mmol/L (ref 0.5–1.9)

## 2022-02-14 LAB — CBG MONITORING, ED: Glucose-Capillary: 109 mg/dL — ABNORMAL HIGH (ref 70–99)

## 2022-02-14 MED ORDER — LACTATED RINGERS IV BOLUS
500.0000 mL | Freq: Once | INTRAVENOUS | Status: AC
  Administered 2022-02-14: 500 mL via INTRAVENOUS

## 2022-02-14 MED ORDER — ALBUTEROL SULFATE HFA 108 (90 BASE) MCG/ACT IN AERS: 2.0000 | INHALATION_SPRAY | RESPIRATORY_TRACT | Status: DC | PRN

## 2022-02-14 MED ORDER — FENTANYL CITRATE PF 50 MCG/ML IJ SOSY
50.0000 ug | PREFILLED_SYRINGE | Freq: Once | INTRAMUSCULAR | Status: AC
  Administered 2022-02-14: 50 ug via INTRAVENOUS
  Filled 2022-02-14: qty 1

## 2022-02-14 MED ORDER — SODIUM CHLORIDE 0.9 % IV SOLN: 3.0000 g | Freq: Once | INTRAVENOUS | Status: DC

## 2022-02-14 MED ORDER — HYDRALAZINE HCL 20 MG/ML IJ SOLN: 10.0000 mg | Freq: Four times a day (QID) | INTRAMUSCULAR | Status: DC | PRN

## 2022-02-14 MED ORDER — PIPERACILLIN-TAZOBACTAM 3.375 G IVPB 30 MIN
3.3750 g | Freq: Once | INTRAVENOUS | Status: AC
  Administered 2022-02-14: 3.375 g via INTRAVENOUS
  Filled 2022-02-14: qty 50

## 2022-02-14 NOTE — ED Provider Notes (Signed)
Care was taken over from Dr. Armandina Gemma.  Patient status post kidney transplant x2 with the last one being in 2008.  She has had some recurrent episodes of pyelonephritis.  She came with some weakness and lower abdominal pain that was consistent with her prior episodes of pyelonephritis.  She also had some pain in her right upper quadrant and a little shortness of breath.  She had ultrasound of her gallbladder which shows no acute abnormalities.  CT renal stone study shows evidence of stranding around the kidney consistent with pyelonephritis.  Her urine is consistent with infection.  I reviewed her prior records and her last urine grew out Enterococcus faecalis but is only 60,000 colonies.  I do not see other recent positive urine cultures.  She is afebrile.  Her white count is elevated.  Her urine was sent for culture.  She is allergic to cephalosporins.  Was started on IV Zosyn given her immunocompromise status.  Her blood pressure is stable.  Is actually elevated.  Her creatinine is similar to her prior values.  I spoke to Dr. Cyd Silence who will admit the patient for further treatment.   Malvin Johns, MD 02/14/22 [id number]

## 2022-02-14 NOTE — ED Provider Triage Note (Signed)
Emergency Medicine Provider Triage Evaluation Note  [facility] , a 46 y.o. female  was evaluated in triage.  Pt complains of right-sided back pain with radiation to her right flank.  Patient states the symptoms have been present for approximately the past week.  She also endorses symptoms of dysuria for the past 2 days.  Patient has a history of renal transplant x2 and states her kidney function test have been declining recently.  She secondarily endorses "chest tightness" that is worsened with taking a deep breath that is not exacerbated by physical activity.  She noticed 1 episode of vomiting this morning but no fever. Denies fever, chills, night sweats, shortness of breath, abdominal pain, N/D, vaginal symptoms.  Review of Systems  Positive: See above Negative:   Physical Exam  BP (!) 137/95   Pulse 78   Temp 98.3 F (36.8 C) (Oral)   Resp (!) 25   Ht '4\' 11"'$  (1.499 m)   Wt 75.8 kg   SpO2 97%   BMI 33.73 kg/m  Gen:   Awake, no distress   Resp:  Normal effort  MSK:   Moves extremities without difficulty  Other:    Medical Decision Making  Medically screening exam initiated at 3:05 PM.  Appropriate orders placed.  Tayna Dawn Carriere was informed that the remainder of the evaluation will be completed by another provider, this initial triage assessment does not replace that evaluation, and the importance of remaining in the ED until their evaluation is complete.     Wilnette Kales, [place] 02/14/22 [phone]

## 2022-02-14 NOTE — Progress Notes (Signed)
A consult was received from an ED physician for Zosyn per pharmacy dosing.  The patient's profile has been reviewed for ht/wt/allergies/indication/available labs.   A one time order has been placed for Zosyn 3.375gm IV Q8h to be infused over 4hrs Further antibiotics/pharmacy consults should be ordered by admitting physician if indicated.                       Thank you, Netta Cedars PharmD 02/14/2022  9:38 PM

## 2022-02-14 NOTE — ED Provider Notes (Signed)
[place] DEPT Provider Note   CSN: [id number] Arrival date & time: 02/14/22  1444     History  Chief Complaint  Patient presents with   Chest Pain   Shortness of Breath   Emesis    Ariel [name] is a 46 y.o. female.   Chest Pain Associated symptoms: shortness of breath and vomiting   Shortness of Breath Associated symptoms: chest pain and vomiting   Emesis   46 year old female with medical history significant for HLD, ESRD status post renal transplant, nephrogenic diabetes insipidus, IBS, gastritis, pancreatitis, HTN, DVT, not on anticoagulation, who presents to the emergency department with multiple complaints.  The patient states that she has had symptoms of dysuria and lower abdominal pain for 2 days.  She states that she has a history of renal transplant x2 and has a history of UTIs.  She feels that she may be developing another one.  She also endorses sharp quadrant and right flank pain.  Additionally, the patient complains of some chest tightness with associated right upper quadrant pain and shortness of breath.  She had 1 episode of emesis this morning, denies any fever, chills, night sweats, abdominal pain.  Pain in her right upper quadrant radiates to her right shoulder.  Home Medications Prior to Admission medications   Medication Sig Start Date End Date Taking? Authorizing Provider  acetaminophen (TYLENOL) 500 MG tablet Take 500 mg by mouth every 6 (six) hours as needed for headache (pain).    [provider]  allopurinol (ZYLOPRIM) 100 MG tablet Take by mouth. 10/13/20   [provider]  ALPRAZolam Duanne Moron) 0.5 MG tablet Take 0.5 mg by mouth at bedtime.  05/31/15   [provider]  aspirin-acetaminophen-caffeine (EXCEDRIN MIGRAINE) [phone] MG tablet Take 1 tablet by mouth every 6 (six) hours as needed for headache. 12/22/21   Antonieta Pert, MD  carvedilol (COREG) 25 MG tablet Take 25 mg by mouth 2 (two)  times daily with a meal. 10/26/21   [provider]  famotidine (PEPCID) 20 MG tablet Take 1 tablet (20 mg total) by mouth daily. 07/19/21   Jackquline Denmark, MD  furosemide (LASIX) 40 MG tablet Take 40 mg by mouth daily as needed for fluid or edema.     [provider]  hydrOXYzine (ATARAX) 25 MG tablet Take 25-50 mg by mouth 2 (two) times daily as needed for dizziness or anxiety. 10/25/21   [provider]  isosorbide mononitrate (IMDUR) 30 MG 24 hr tablet Take 1 tablet (30 mg total) by mouth every morning. 11/06/21   Revankar, Reita Cliche, MD  Magnesium 200 MG TABS Take by mouth.    [provider]  magnesium gluconate (MAGONATE) 500 MG tablet Take 500 mg by mouth every evening.    [provider]  medroxyPROGESTERone (PROVERA) 10 MG tablet Take 10 mg by mouth daily. 04/09/17   [provider]  mycophenolate (MYFORTIC) 360 MG TBEC EC tablet Take 360 mg by mouth 2 (two) times daily. 02/24/19   [provider]  ondansetron (ZOFRAN-ODT) 4 MG disintegrating tablet Take 4 mg by mouth as needed for nausea/vomiting. 03/25/18   [provider]  potassium chloride SA (KLOR-CON M) 20 MEQ tablet Take by mouth. 06/08/21   [provider]  predniSONE (DELTASONE) 5 MG tablet Take 5 mg by mouth daily.      [provider]  sertraline (ZOLOFT) 50 MG tablet Take 50 mg by mouth daily.    [provider]  sevelamer carbonate (RENVELA) 800 MG tablet Take by mouth. 01/04/22   [provider]  simvastatin (ZOCOR) 5 MG tablet Take by mouth. 11/08/21   [provider]  sodium bicarbonate 650 MG tablet Take 1,300 mg by mouth every evening. 12/14/20   [provider]  tacrolimus (PROGRAF) 0.5 MG capsule Take 0.5 mg by mouth 2 (two) times daily. Take along with 1 mg capsule=1.5 mg BID    [provider]  tacrolimus (PROGRAF) 1 MG capsule Take 1 mg by mouth 2 (two) times daily. Take along with 0.5 mg capsule=1.5  mg BID 03/20/17   [provider]  traZODone (DESYREL) 100 MG tablet Take 100 mg by mouth at bedtime. 10/05/21   [provider]  Colchicine 0.6 MG CAPS Take 1 capsule by mouth daily. 01/24/20 02/28/20  [provider]      Allergies    Bactrim [sulfamethoxazole-trimethoprim], Sulfamethoxazole, Hydrogen peroxide, Labetalol, Wound dressing adhesive, Zolpidem, Amlodipine, Hyoscyamine, Keflex [cephalexin], Adhesive [tape], Imuran [azathioprine sodium], Neosporin [bacitracin-polymyxin b], Tramadol, and Warfarin sodium    Review of Systems   Review of Systems  Respiratory:  Positive for shortness of breath.   Cardiovascular:  Positive for chest pain.  Gastrointestinal:  Positive for vomiting.  All other systems reviewed and are negative.   Physical Exam Updated Vital Signs BP (!) 149/96   Pulse 76   Temp 98.3 F (36.8 C) (Oral)   Resp (!) 23   Ht '4\' 11"'$  (1.499 m)   Wt 75.8 kg   SpO2 100%   BMI 33.73 kg/m  Physical Exam Vitals and nursing note reviewed.  Constitutional:      General: She is not in acute distress.    Appearance: She is well-developed.  HENT:     Head: Normocephalic and atraumatic.  Eyes:     Conjunctiva/sclera: Conjunctivae normal.  Cardiovascular:     Rate and Rhythm: Normal rate and regular rhythm.     Heart sounds: No murmur heard. Pulmonary:     Effort: Pulmonary effort is normal. No respiratory distress.     Breath sounds: Normal breath sounds.  Abdominal:     Palpations: Abdomen is soft.     Tenderness: There is abdominal tenderness in the right lower quadrant.  Musculoskeletal:        General: No swelling.     Cervical back: Neck supple.  Skin:    General: Skin is warm and dry.     Capillary Refill: Capillary refill takes less than 2 seconds.  Neurological:     Mental Status: She is alert.  Psychiatric:        Mood and Affect: Mood normal.     ED Results / Procedures / Treatments   Labs (all labs ordered are listed,  but only abnormal results are displayed) Labs Reviewed  BASIC METABOLIC PANEL  CBC  I-STAT BETA HCG BLOOD, ED ([facility], [facility], [facility] ONLY)  TROPONIN I (HIGH SENSITIVITY)    EKG None  Radiology DG Chest 2 View  Result Date: 02/14/2022 CLINICAL DATA:  Shortness of breath. EXAM: CHEST - 2 VIEW COMPARISON:  October 29, 2021. FINDINGS: Mild streaky bibasilar opacities. No confluent consolidation no visible pleural effusions or pneumothorax. Similar cardiomediastinal silhouette. No displaced fracture. IMPRESSION: Mild streaky bibasilar opacities, favor atelectasis. No confluent consolidation. Electronically Signed   By: Margaretha Sheffield M.D.   On: 02/14/2022 15:12    Procedures Procedures    Medications Ordered in ED Medications  albuterol (VENTOLIN HFA) 108 (90 Base) MCG/ACT inhaler 2  puff (has no administration in time range)    ED Course/ Medical Decision Making/ A&P                           Medical Decision Making Amount and/or Complexity of Data Reviewed Labs: ordered. Radiology: ordered.  Risk Prescription drug management. Decision regarding hospitalization.    46 year old female with medical history significant for HLD, ESRD status post renal transplant, nephrogenic diabetes insipidus, IBS, gastritis, pancreatitis, HTN, DVT, not on anticoagulation, who presents to the emergency department with multiple complaints.  The patient states that she has had symptoms of dysuria and lower abdominal pain for 2 days.  She states that she has a history of renal transplant x2 and has a history of UTIs.  She feels that she may be developing another one.  She also endorses sharp quadrant and right flank pain.  Additionally, the patient complains of some chest tightness with associated right upper quadrant pain and shortness of breath.  She had 1 episode of emesis this morning, denies any fever, chills, night sweats, abdominal pain.  Pain in her right upper quadrant radiates to her right  shoulder.  On arrival, patient was afebrile, hemodynamically stable, mildly tachypneic and hypertensive, saturating well on room air.  Sinus rhythm noted on cardiac telemetry.  Presenting with right lower quadrant pain in the setting of the patient's prior renal transplants.  She has had multiple episodes of urinary tract infections and pyelonephritis in the past.  Additionally, she presents with right upper quadrant pain/right-sided chest pain and shortness of breath.  Differential diagnosis includes pyelonephritis/UTI, nephrolithiasis, cholecystitis/cholelithiasis, less likely PE, pneumonia, pneumothorax, pleural effusion.  Laboratory work-up initiated to include urinalysis, CBC, BMP, hepatic function panel, hCG, D-dimer.  CT abdomen pelvis without contrast and right upper quadrant ultrasound and chest x-ray was also ordered and pending.  Plan at time of signout to follow-up laboratory results and imaging results.  Signout given to Dr. Tamera Punt at 1700.   Final Clinical Impression(s) / ED Diagnoses Final diagnoses:  None    Rx / DC Orders ED Discharge Orders     None         Regan Lemming, MD 02/15/22 [phone]

## 2022-02-14 NOTE — ED Triage Notes (Signed)
Pt reports chest pain, SHOB, nausea, one episode of vomiting last night.

## 2022-02-15 ENCOUNTER — Inpatient Hospital Stay (HOSPITAL_COMMUNITY): Payer: Medicare Other

## 2022-02-15 ENCOUNTER — Encounter (HOSPITAL_COMMUNITY): Payer: Self-pay | Admitting: Internal Medicine

## 2022-02-15 DIAGNOSIS — I10 Essential (primary) hypertension: Secondary | ICD-10-CM

## 2022-02-15 DIAGNOSIS — R109 Unspecified abdominal pain: Secondary | ICD-10-CM

## 2022-02-15 DIAGNOSIS — K219 Gastro-esophageal reflux disease without esophagitis: Secondary | ICD-10-CM

## 2022-02-15 DIAGNOSIS — N185 Chronic kidney disease, stage 5: Secondary | ICD-10-CM | POA: Diagnosis not present

## 2022-02-15 DIAGNOSIS — N2581 Secondary hyperparathyroidism of renal origin: Secondary | ICD-10-CM

## 2022-02-15 DIAGNOSIS — E782 Mixed hyperlipidemia: Secondary | ICD-10-CM

## 2022-02-15 DIAGNOSIS — T8612 Kidney transplant failure: Secondary | ICD-10-CM | POA: Diagnosis not present

## 2022-02-15 DIAGNOSIS — K8689 Other specified diseases of pancreas: Principal | ICD-10-CM

## 2022-02-15 DIAGNOSIS — M109 Gout, unspecified: Secondary | ICD-10-CM

## 2022-02-15 DIAGNOSIS — R933 Abnormal findings on diagnostic imaging of other parts of digestive tract: Secondary | ICD-10-CM | POA: Diagnosis not present

## 2022-02-15 HISTORY — DX: Other specified diseases of pancreas: K86.89

## 2022-02-15 LAB — HEPATIC FUNCTION PANEL
ALT: 10 U/L (ref 0–44)
AST: 14 U/L — ABNORMAL LOW (ref 15–41)
Albumin: 3.5 g/dL (ref 3.5–5.0)
Alkaline Phosphatase: 42 U/L (ref 38–126)
Bilirubin, Direct: 0.1 mg/dL (ref 0.0–0.2)
Indirect Bilirubin: 0.5 mg/dL (ref 0.3–0.9)
Total Bilirubin: 0.6 mg/dL (ref 0.3–1.2)
Total Protein: 6.2 g/dL — ABNORMAL LOW (ref 6.5–8.1)

## 2022-02-15 LAB — CBC WITH DIFFERENTIAL/PLATELET
Abs Immature Granulocytes: 0.13 10*3/uL — ABNORMAL HIGH (ref 0.00–0.07)
Basophils Absolute: 0 10*3/uL (ref 0.0–0.1)
Basophils Relative: 0 %
Eosinophils Absolute: 0 10*3/uL (ref 0.0–0.5)
Eosinophils Relative: 0 %
HCT: 31.4 % — ABNORMAL LOW (ref 36.0–46.0)
Hemoglobin: 9.9 g/dL — ABNORMAL LOW (ref 12.0–15.0)
Immature Granulocytes: 1 %
Lymphocytes Relative: 14 %
Lymphs Abs: 1.5 10*3/uL (ref 0.7–4.0)
MCH: 29.4 pg (ref 26.0–34.0)
MCHC: 31.5 g/dL (ref 30.0–36.0)
MCV: 93.2 fL (ref 80.0–100.0)
Monocytes Absolute: 0.6 10*3/uL (ref 0.1–1.0)
Monocytes Relative: 5 %
Neutro Abs: 8.8 10*3/uL — ABNORMAL HIGH (ref 1.7–7.7)
Neutrophils Relative %: 80 %
Platelets: 181 10*3/uL (ref 150–400)
RBC: 3.37 MIL/uL — ABNORMAL LOW (ref 3.87–5.11)
RDW: 13.7 % (ref 11.5–15.5)
WBC: 11.1 10*3/uL — ABNORMAL HIGH (ref 4.0–10.5)
nRBC: 0 % (ref 0.0–0.2)

## 2022-02-15 LAB — BASIC METABOLIC PANEL
Anion gap: 7 (ref 5–15)
BUN: 43 mg/dL — ABNORMAL HIGH (ref 6–20)
CO2: 21 mmol/L — ABNORMAL LOW (ref 22–32)
Calcium: 7.7 mg/dL — ABNORMAL LOW (ref 8.9–10.3)
Chloride: 111 mmol/L (ref 98–111)
Creatinine, Ser: 4.05 mg/dL — ABNORMAL HIGH (ref 0.44–1.00)
GFR, Estimated: 13 mL/min — ABNORMAL LOW (ref >60)
Glucose, Bld: 98 mg/dL (ref 70–99)
Potassium: 5.3 mmol/L — ABNORMAL HIGH (ref 3.5–5.1)
Sodium: 139 mmol/L (ref 135–145)

## 2022-02-15 LAB — LIPASE, BLOOD: Lipase: 90 U/L — ABNORMAL HIGH (ref 11–51)

## 2022-02-15 LAB — PROCALCITONIN: Procalcitonin: 0.33 ng/mL

## 2022-02-15 LAB — C-REACTIVE PROTEIN: CRP: <0.5 mg/dL (ref <1.0)

## 2022-02-15 LAB — MAGNESIUM: Magnesium: 1.4 mg/dL — ABNORMAL LOW (ref 1.7–2.4)

## 2022-02-15 LAB — LACTIC ACID, PLASMA: Lactic Acid, Venous: 1.3 mmol/L (ref 0.5–1.9)

## 2022-02-15 LAB — POTASSIUM: Potassium: 5.3 mmol/L — ABNORMAL HIGH (ref 3.5–5.1)

## 2022-02-15 LAB — PHOSPHORUS: Phosphorus: 3.8 mg/dL (ref 2.5–4.6)

## 2022-02-15 MED ORDER — SODIUM BICARBONATE 650 MG PO TABS
1300.0000 mg | ORAL_TABLET | Freq: Every evening | ORAL | Status: DC
  Administered 2022-02-15 – 2022-02-16 (×2): 1300 mg via ORAL
  Filled 2022-02-15 (×2): qty 2

## 2022-02-15 MED ORDER — FAMOTIDINE 20 MG PO TABS
20.0000 mg | ORAL_TABLET | Freq: Every day | ORAL | Status: DC
  Administered 2022-02-16 – 2022-02-17 (×2): 20 mg via ORAL
  Filled 2022-02-15 (×3): qty 1

## 2022-02-15 MED ORDER — TRAZODONE HCL 100 MG PO TABS
100.0000 mg | ORAL_TABLET | Freq: Every day | ORAL | Status: DC
  Administered 2022-02-15 – 2022-02-16 (×2): 100 mg via ORAL
  Filled 2022-02-15 (×2): qty 1

## 2022-02-15 MED ORDER — SEVELAMER CARBONATE 800 MG PO TABS
800.0000 mg | ORAL_TABLET | Freq: Three times a day (TID) | ORAL | Status: DC
  Administered 2022-02-15 – 2022-02-17 (×7): 800 mg via ORAL
  Filled 2022-02-15 (×9): qty 1

## 2022-02-15 MED ORDER — HYDROXYZINE HCL 25 MG PO TABS: 25.0000 mg | ORAL_TABLET | Freq: Two times a day (BID) | ORAL | Status: DC | PRN

## 2022-02-15 MED ORDER — ALPRAZOLAM 0.5 MG PO TABS
0.5000 mg | ORAL_TABLET | Freq: Every day | ORAL | Status: DC
  Administered 2022-02-15 – 2022-02-16 (×2): 0.5 mg via ORAL
  Filled 2022-02-15 (×2): qty 1

## 2022-02-15 MED ORDER — PIPERACILLIN-TAZOBACTAM IN DEX 2-0.25 GM/50ML IV SOLN
2.2500 g | Freq: Three times a day (TID) | INTRAVENOUS | Status: DC
Start: 2022-02-15 — End: 2022-02-17
  Administered 2022-02-15 – 2022-02-17 (×6): 2.25 g via INTRAVENOUS
  Filled 2022-02-15 (×9): qty 50

## 2022-02-15 MED ORDER — PREDNISONE 5 MG PO TABS
5.0000 mg | ORAL_TABLET | Freq: Every day | ORAL | Status: DC
  Administered 2022-02-16 – 2022-02-17 (×2): 5 mg via ORAL
  Filled 2022-02-15 (×3): qty 1

## 2022-02-15 MED ORDER — ISOSORBIDE MONONITRATE ER 30 MG PO TB24
30.0000 mg | ORAL_TABLET | Freq: Every morning | ORAL | Status: DC
  Administered 2022-02-16 – 2022-02-17 (×2): 30 mg via ORAL
  Filled 2022-02-15 (×3): qty 1

## 2022-02-15 MED ORDER — POLYETHYLENE GLYCOL 3350 17 G PO PACK
17.0000 g | PACK | Freq: Every day | ORAL | Status: DC | PRN
  Administered 2022-02-15: 17 g via ORAL
  Filled 2022-02-15: qty 1

## 2022-02-15 MED ORDER — MYCOPHENOLATE SODIUM 180 MG PO TBEC
360.0000 mg | DELAYED_RELEASE_TABLET | Freq: Two times a day (BID) | ORAL | Status: DC
  Administered 2022-02-15 – 2022-02-17 (×4): 360 mg via ORAL
  Filled 2022-02-15 (×6): qty 2

## 2022-02-15 MED ORDER — TACROLIMUS 1 MG PO CAPS: 1.0000 mg | ORAL_CAPSULE | Freq: Two times a day (BID) | ORAL | Status: DC

## 2022-02-15 MED ORDER — TACROLIMUS 0.5 MG PO CAPS: 0.5000 mg | ORAL_CAPSULE | Freq: Two times a day (BID) | ORAL | Status: DC

## 2022-02-15 MED ORDER — SIMVASTATIN 10 MG PO TABS
5.0000 mg | ORAL_TABLET | Freq: Every day | ORAL | Status: DC
  Administered 2022-02-15 – 2022-02-16 (×2): 5 mg via ORAL
  Filled 2022-02-15 (×2): qty 1

## 2022-02-15 MED ORDER — ONDANSETRON HCL 4 MG PO TABS: 4.0000 mg | ORAL_TABLET | Freq: Four times a day (QID) | ORAL | Status: DC | PRN

## 2022-02-15 MED ORDER — ALLOPURINOL 100 MG PO TABS
100.0000 mg | ORAL_TABLET | Freq: Every day | ORAL | Status: DC
  Administered 2022-02-16 – 2022-02-17 (×2): 100 mg via ORAL
  Filled 2022-02-15 (×3): qty 1

## 2022-02-15 MED ORDER — ONDANSETRON HCL 4 MG/2ML IJ SOLN: 4.0000 mg | Freq: Four times a day (QID) | INTRAMUSCULAR | Status: DC | PRN

## 2022-02-15 MED ORDER — TACROLIMUS 1 MG PO CAPS
1.5000 mg | ORAL_CAPSULE | Freq: Two times a day (BID) | ORAL | Status: DC
  Administered 2022-02-15 – 2022-02-17 (×4): 1.5 mg via ORAL
  Filled 2022-02-15 (×5): qty 1

## 2022-02-15 MED ORDER — SERTRALINE HCL 50 MG PO TABS
50.0000 mg | ORAL_TABLET | Freq: Every day | ORAL | Status: DC
  Administered 2022-02-16 – 2022-02-17 (×2): 50 mg via ORAL
  Filled 2022-02-15 (×3): qty 1

## 2022-02-15 MED ORDER — PIPERACILLIN-TAZOBACTAM 3.375 G IVPB 30 MIN
3.3750 g | Freq: Once | INTRAVENOUS | Status: AC
Start: 2022-02-15 — End: 2022-02-15
  Administered 2022-02-15: 3.375 g via INTRAVENOUS
  Filled 2022-02-15: qty 50

## 2022-02-15 MED ORDER — ALBUTEROL SULFATE (2.5 MG/3ML) 0.083% IN NEBU: 2.5000 mg | INHALATION_SOLUTION | RESPIRATORY_TRACT | Status: DC | PRN

## 2022-02-15 MED ORDER — MAGNESIUM SULFATE 2 GM/50ML IV SOLN
2.0000 g | Freq: Once | INTRAVENOUS | Status: AC
  Administered 2022-02-15: 2 g via INTRAVENOUS
  Filled 2022-02-15: qty 50

## 2022-02-15 MED ORDER — CARVEDILOL 25 MG PO TABS
25.0000 mg | ORAL_TABLET | Freq: Two times a day (BID) | ORAL | Status: DC
  Administered 2022-02-15 – 2022-02-17 (×4): 25 mg via ORAL
  Filled 2022-02-15 (×2): qty 1
  Filled 2022-02-15: qty 2
  Filled 2022-02-15 (×2): qty 1

## 2022-02-15 MED ORDER — OXYCODONE HCL 5 MG PO TABS
5.0000 mg | ORAL_TABLET | Freq: Once | ORAL | Status: AC
  Administered 2022-02-15: 5 mg via ORAL
  Filled 2022-02-15: qty 1

## 2022-02-15 NOTE — Assessment & Plan Note (Addendum)
   Notable for new substantial dilation of the pancreatic duct on CT imaging, not present on abdominal imaging several months ago.  Unclear etiology, see remainder of assessment and plan above.

## 2022-02-15 NOTE — Consult Note (Signed)
Renal Service Consult Note [facility]  Oluwadara Gorman Jue [birth year] Sol Blazing, MD Requesting Physician: Dr. Cyd Silence  Reason for Consult: ESRD/ transplant patient w/ flank pain / dysuria HPI: The patient is a 46 y.o. 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 She also has hx of DVT, HTN, UTIs. Pt admitted for suspected UTI/ pyelonephritis. We are asked to see for esrd/ transplant.   Pt seen in ED, states her last creat was 4.5. Here creat is 3.9 here. She denies any chronic fatigue or loss of appetite. She knows all her transplant meds and their dosing.   ROS - denies CP, no joint pain, no HA, no blurry vision, no rash, no diarrhea, no nausea/ vomiting, no dysuria, no difficulty voiding   Past Medical History  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   Anemia    Anemia associated with chronic renal failure    Anorectal disorder 06/08/2020   Anorectal pain 06/08/2020   Anxiety    Back pain    Bloating 04/16/2018   Cancer ([place])    pre cervical   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    of the [place]   Complications due to cardiac device, implant, and graft 09/02/2012   Condyloma of female genitalia    Dehydration 01/19/2019   Depression    Dialysis patient [facility])    Drug-induced bleeding disorder ([address]) 01/19/2019   DVT of axillary vein, acute left ([place]) 08/02/2012   Dysmenorrhea 10/19/2015   Dyspnea on exertion 01/19/2019   Dysuria    ESRD (end stage renal disease) ([place])    sp transplant x 2, did mix of HD and PD from 2001- 2008   Essential hypertension with goal blood pressure less than 130/80    Fatigue    Gastritis, acute    Gout    History of parathyroidectomy ([place]) 10/29/2018   History of renal transplant    HTN (hypertension) 01/19/2019    Hypercalcemia    Hypercholesterolemia    Hyperlipidemia    Hypokalemia    Hypomagnesemia    Hypothyroid 01/19/2019   IBS (irritable bowel syndrome)    Immunosuppressive management encounter following kidney transplant 10/29/2018   Internal hemorrhoid 04/22/2018   Iron deficiency anemia 10/19/2015   Irregular uterine bleeding 02/13/2016   Kidney transplant recipient 10/29/2018   1st transplant [facility]) was 1995- 2001.  Did HD/ PD mix from 2001- 2008. 2nd transplant ([facility]/ [facility]) was in 2008 and is still working as of 12/2021. F/b Dr Hollie Salk ([place].   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   Other chronic sinusitis 10/29/2018   Palpitations    Pancreatitis    Perianal lesion 04/22/2018   Phlebitis and thrombophlebitis of other sites 08/02/2012   Plantar fasciitis 07/03/2018   Pruritus ani 06/26/2018   Right lower quadrant abdominal pain 10/29/2018   [facility] spotted fever    Secondary hyperparathyroidism of renal origin [facility])    Secondary hypertension due to renal disease 10/29/2018   Sesamoiditis 07/03/2018   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  Transplanted organ removal status 10/29/2018   Unstable angina ([place]) 01/19/2019   Urinary tract infection    Vulvar dystrophy    Yeast dermatitis 08/14/2018   Past Surgical History  Past Surgical History:  Procedure Laterality Date   AV FISTULA PLACEMENT     COLONOSCOPY  11/28/2005   [facility]   COLONOSCOPY  04/13/2016   Dr Orlena Sheldon   ESOPHAGOGASTRODUODENOSCOPY  07/31/2017   Distal esophageal ulcers (likely due to gastroeesphageal reflix-biopsied). Small hiatal hernia. Erosive gastritis.   HEMORRHOID SURGERY     2019, and 04/29/2020   INSERTION OF [place],  2008   KNEE SURGERY Left 2020   PARATHYROIDECTOMY  2004   Portion on the parathyroid gland transplanted in R forearm   REMOVAL OF A DIALYSIS CATHETER     SIMPLE VULVECTOMY  06/19/2011   Partial simple left   SKIN CANCER EXCISION  2016   Per pt, area removed on scalp showed squamous cell carcinoma   TUBAL LIGATION     Family History  Family History  Problem Relation Age of Onset   Hypertension Mother    Hyperlipidemia Mother    Diabetes Father    Rectal cancer Maternal Grandmother    Colon cancer Maternal Grandmother    Esophageal cancer Neg Hx    Social History  reports that she has never smoked. She has never used smokeless tobacco. She reports that she does not drink alcohol and does not use drugs. Allergies  Allergies  Allergen Reactions   Bactrim [Sulfamethoxazole-Trimethoprim] Anaphylaxis   Sulfamethoxazole Anaphylaxis   Hydrogen Peroxide Rash   Labetalol    Wound Dressing Adhesive Hives   Zolpidem    Amlodipine Swelling   Hyoscyamine Other (See Comments)    Dizziness    Keflex [Cephalexin] Hives and Swelling    Per pt, her face and lips swell up and she develops hives   Adhesive [Tape] Rash   Imuran [Azathioprine Sodium] Rash   Neosporin [Bacitracin-Polymyxin B] Rash and Other (See Comments)    Can use that for awhile, but will eventually develop a rash    Tramadol Rash   Warfarin Sodium Rash   Home medications Prior to Admission medications   Medication Sig Start Date End Date Taking? Authorizing Provider  acetaminophen (TYLENOL) 500 MG tablet Take 500 mg by mouth every 6 (six) hours as needed for headache (pain).   Yes [provider]  allopurinol (ZYLOPRIM) 100 MG tablet Take 100 mg by mouth daily. 10/13/20  Yes [provider]  ALPRAZolam Duanne Moron) 0.5 MG tablet Take 0.5 mg by mouth at bedtime.  05/31/15  Yes [provider]  aspirin-acetaminophen-caffeine (EXCEDRIN MIGRAINE) [phone] MG tablet Take 1 tablet by mouth every 6 (six)  hours as needed for headache. 12/22/21  Yes Antonieta Pert, MD  carvedilol (COREG) 25 MG tablet Take 25 mg by mouth 2 (two) times daily with a meal. 10/26/21  Yes [provider]  famotidine (PEPCID) 20 MG tablet Take 1 tablet (20 mg total) by mouth daily. 07/19/21  Yes Jackquline Denmark, MD  furosemide (LASIX) 40 MG tablet Take 40 mg by mouth daily as needed for fluid or edema.    Yes [provider]  hydrOXYzine (ATARAX) 25 MG tablet Take 25-50 mg by mouth 2 (two) times daily as needed for dizziness or anxiety. 10/25/21  Yes [provider]  isosorbide mononitrate (IMDUR) 30 MG 24 hr tablet Take 1 tablet (30 mg total) by mouth every morning.  11/06/21  Yes Revankar, Reita Cliche, MD  magnesium gluconate (MAGONATE) 500 MG tablet Take 500 mg by mouth every evening.   Yes [provider]  medroxyPROGESTERone (PROVERA) 10 MG tablet Take 10 mg by mouth daily. 04/09/17  Yes [provider]  mycophenolate (MYFORTIC) 360 MG TBEC EC tablet Take 360 mg by mouth 2 (two) times daily. 02/24/19  Yes [provider]  ondansetron (ZOFRAN-ODT) 4 MG disintegrating tablet Take 4 mg by mouth as needed for nausea/vomiting. 03/25/18  Yes [provider]  potassium chloride SA (KLOR-CON M) 20 MEQ tablet Take 20 mEq by mouth daily. 06/08/21  Yes [provider]  predniSONE (DELTASONE) 5 MG tablet Take 5 mg by mouth daily.     Yes [provider]  sertraline (ZOLOFT) 50 MG tablet Take 50 mg by mouth daily.   Yes [provider]  sevelamer carbonate (RENVELA) 800 MG tablet Take 800 mg by mouth 3 (three) times daily with meals. 01/04/22  Yes [provider]  simvastatin (ZOCOR) 5 MG tablet Take 5 mg by mouth at bedtime. 11/08/21  Yes [provider]  sodium bicarbonate 650 MG tablet Take 1,300 mg by mouth every evening. 12/14/20  Yes [provider]  tacrolimus (PROGRAF) 0.5 MG capsule Take 0.5 mg by mouth 2 (two) times daily. Take along  with 1 mg capsule=1.5 mg BID   Yes [provider]  tacrolimus (PROGRAF) 1 MG capsule Take 1 mg by mouth 2 (two) times daily. Take along with 0.5 mg capsule=1.5 mg BID 03/20/17  Yes [provider]  traZODone (DESYREL) 100 MG tablet Take 100 mg by mouth at bedtime. 10/05/21  Yes [provider]  Colchicine 0.6 MG CAPS Take 1 capsule by mouth daily. 01/24/20 02/28/20  [provider]     Vitals:   02/15/22 1030 02/15/22 1100 02/15/22 1130 02/15/22 1310  BP: (!) 161/70 (!) 150/72 (!) 150/109 (!) 158/78  Pulse: 66 (!) 59 67 71  Resp: 19 (!) 24 (!) 21 18  Temp:    98.2 F (36.8 C)  TempSrc:      SpO2: 100% 100% 100% 99%  Weight:      Height:       Exam Gen alert, no distress No rash, cyanosis or gangrene Sclera anicteric, throat clear  No jvd or bruits Chest clear bilat to bases, no rales/ wheezing RRR no MRG Abd soft ntnd no mass or ascites +bs GU defer MS no joint effusions or deformity Ext no LE or UE edema, no wounds or ulcers Neuro is alert, Ox 3 , nf    No active AVF./ AVG      Home meds:   allopurinol  alprazolam  carvedilol 25 bid  pepcid  furosemide 40 qd prn  isosorbide mononitrate  medroxyprogesterone  mycophenolate 360 bid  klor con 20  prednisone 5  sertraline  sevelamer 800 tid  simvastatin  sod bicarb 1300 hs  prograf 1.5 bid  traozodone        Assessment/ Plan: CKD 5 renal transplant - this transplant was done in 2008, failing slowly, she is f/[facility] Dr [facility] She has upcoming [facility] appt to get a new HD access. She is taking all her transplant medications properly and at the right dosing. Does not looks vol overloaded or dehydrated. Creat is close to current caseline at 4.0 here. No signs of uremia. No new suggestions. Would hold the prn lasix for now w/ likely UTI/ infection.  Abd pain -  suspected pyelonephritis, on IV abx, cx's pending. Not toxic in appearance. HTN - getting home coreg, imdur here      Kelly Splinter  MD 02/15/2022, 4:15 PM Recent Labs  Lab 02/14/22 1539 02/15/22 0330 02/15/22 0443 02/15/22 1354  HGB 10.5*  --  9.9*  --   ALBUMIN 3.7 3.5  --   --   CALCIUM 8.1* 7.7*  --   --   PHOS  --  3.8  --   --   CREATININE 3.90* 4.05*  --   --   K 4.4 5.3*  --  5.3*

## 2022-02-15 NOTE — Care Plan (Signed)
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  Patient is following up with Dr. Hollie Salk as well as [facility] transplant clinic seeking a third kidney transplant.  Patient presented in the ED with multiple complaints (abdominal pain, right flank pain with dysuria). CT renal protocol reveals mild dilatation of pancreatic duct within the head of pancreas measuring up to 12 mm in diameter concerning for possible postinflammatory stricture or neoplasm.  Patient was admitted for abdominal pain.  GI is consulted recommended MRCP.  Nephrology is also consulted.  Patient was seen and examined at bedside.  Overnight events noted.  Patient reports feeling better.

## 2022-02-15 NOTE — Assessment & Plan Note (Addendum)
.   Extremely advanced chronic kidney disease due to longstanding graft ejection. . This is the patient's second graft rejection patient is currently being seen transplant clinic for consideration of a third transplant . Strict intake and output monitoring . Creatinine near baseline although overall creatinine has been worsening over the past several months. . Minimizing nephrotoxic agents as much as possible . Serial chemistries to monitor renal function and electrolytes . We will obtain nephrology consultation in the morning considering patient's advanced disease

## 2022-02-15 NOTE — Assessment & Plan Note (Signed)
Continue famotidine 

## 2022-02-15 NOTE — Assessment & Plan Note (Signed)
   Patient has undergone 2 separate transplants in the past.  Most recent transplant was in 2015 patient since having developed Rejection once again  Patient still continues to be on immunosuppressants including tacrolimus, mycophenolate and low-dose prednisone  Overall patient's kidney function has continued to decline.    Patient follows with Dr. Hollie Salk in nephrology clinic.  Patient additionally follows up with [facility]

## 2022-02-15 NOTE — Assessment & Plan Note (Addendum)
   Complicated presentation.  Patient with known history of failed renal transplant x2, now essentially CKD stage V with a chronically inflamed and edematous transplanted kidney with multiple bouts of pyelonephritis in the past.  Urinalysis is equivocal however this immunocompromised patient is exhibiting a substantial leukocytosis with elevated procalcitonin being about concern for recurrent pyelonephritis.   Ucx 11/2021 grew Enterococcus   Furthermore, patient has a notably 12 mm dilated pancreatic duct and patient's symptoms of pain (epigastric , wrapping around to thoracic back at times) seem like this could be the culprit instead.  Patient has had intermittently elevated lipase levels in the past several months with Dr. Lyndel Safe mentioning late last year that it was in the thousands at one point .   In the meantime, urine cultures have been obtained and empiric intravenous Zosyn has been  Noncontrast ERCP has been ordered after discussion with Dr. Carlean Purl with [facility], his team will consult on this patient later in the day  If urine cultures remain without growth after 48 hours then antibiotic discontinuation can be considered

## 2022-02-15 NOTE — Consult Note (Signed)
Referring Provider: Dr. Inda Merlin Primary Care Physician:  [name], PA-C Primary Gastroenterologist:  Dr. Carmell Austria   Reason for Consultation:  Dilated pancreatic duct   HPI: Ariel [name] is a 46 y.o. female with a past medical history of anxiety, depression, pretension, ESRD s/p renal transplant x 2, hypothyroidism, hyperparathyroidism left axillary vein DVT 2013, gout, gastritis pancreatitis and IBS.  She developed chest pain, shortness of breath, nausea and vomiting on 02/14/2022 therefore she presented to [facility] ED for further evaluation.  She also did having right sided back pain which radiated to her right flank area.  Dysuria x 2 days.  Labs in the ED showed a WBC count of 14.7.  Hemoglobin 10.5 (baseline Hg 11.2 - 11/9).  Hematocrit 33.4.  Platelet 198.  Sodium 139.  Potassium 4.4.  Glucose 106.  BUN 40.  Creatinine 3.90 (Cr 4.23 on 12/22/2021).  Calcium 8.1.  Albumin 3.7.  Alk phos 43.  Total bili 0.5.  AST 17.  ALT 10.  Troponin 8.  Elevated D-dimer 0.85.  Beta hCG negative.  Urine with all large amount of leukocytes and protein level 100.  Urine and blood cultures pending.  Chest x-ray showed mild streaky bibasilar opacities suggestive of atelectasis.  RUQ sonogram without evidence of cholelithiasis or acute cholecystitis but showed a course contour of the liver with increased parenchymal echogenicity suggestive of cirrhosis. CTAP without contrast identified a stable right iliac fossa transplant kidney with marked enlargement, sigmoid diverticulosis, asymmetric subcutaneous edema to the RLQ abdominal wall and marked dilatation of the pancreatic duct within the head of the pancreas measuring up to 12 mm in diameter which was not well characterized on noncontrast study.Differential considerations include a main duct IPMN, postinflammatory stricture, or possibly a pancreatic neoplasm.   A GI consult was requested for further evaluation regarding pancreatic ductal  dilatation.  An abdominal MRI/MRCP without contrast ordered, not yet completed  Labs today show a WBC count of 11.1.  Hemoglobin 9.9.  Calcium 5.3.  BUN 43.  Creatinine 4.05.  Calcium 7.7.  Lipase 90 (Lipase 357 on 11/16/2021).  Alk phos 42.  Total bili 0.6.  AST 14.  ALT 10.  She awakened yesterday morning and felt choked, she felt as if she needed to vomit but could not.  She continued to have dry heaves with right-sided chest pain with inspiration.  She described having lower back pain which radiated across to the right and left flank area to her central abdomen which was more severe than her chronic back and abdominal pain.  She has a history of GERD.  She continues to have daily heartburn despite taking Famotidine 20 mg bid.  No dysphagia.  She takes Excedrin as needed for headaches, last dose was 1 month ago.  No other NSAIDs.  Her bowel pattern varies, she has alternating constipation and diarrhea.  She passed a large hard bowel movement yesterday with a small to moderate amount of bright red blood seen on the stool and in the toilet water. No rectal pain.  She underwent hemorrhoidectomy surgery in the past.  She was seen by Dr. Lyndel Safe 10/11/2021 as an outpatient for GERD, chronic RUQ pain and IBS follow-up.  She was scheduled for an EGD and colonoscopy which were canceled due to her hospitalization 12/2021.  Her most recent colonoscopy was 04/13/2016 by Dr. Melina Copa which showed isolated areas of inflammation in the rectum, sigmoid and descending colon with several small superficial erosions.  Biopsies of the right colon  were consistent with normal colonic mucosa without evidence of colitis or IBD.  Left colon biopsies were consistent with benign lymphoid aggregates.  She underwent her first kidney transplant at [facility] in 1995 which subsequently failed and she required dialysis. She underwent a second kidney transplant at [facility] in [place] in 2008.  She remains on Prograf, Mycophenolate and  Prednisone.  She is had a decline in her transplanted kidney function since April 2023.  She was admitted to the hospital 4/19 -12/22/2021 with progressive weakness associated with hyperphosphatemia hypercalcemia and worsening renal function.  She received IV fluids, Cinacalcet and Renvela.  Her renal status and electrolyte abnormalities stabilized and she was discharged home with continued nephrology follow-up with Dr. Madelon Lips.  PAST GI PROCEDURES:   EGD 12/21/2005 by Dr. Deatra Ina: Normal EGD  Colonoscopy 11/28/2005 by Dr. Deatra Ina: 6 mm polyp removed from the descending colon  Colonoscopy 04/13/2016 by Dr. Melina Copa: After numerous attempts the TI could not be intubated There were isolated small areas of focal inflammation in the rectum, sigmoid and descending colon.  Several had very small superficial erosions. Multiple biopsies taken. The right colon appeared normal.  Multiple biopsies taken.  Past Image Studies:  CTAP without contrast 06/13/2021 at [facility] Persistent edematous appearance of right lower quadrant renal transplant with perirenal and right retroperitoneal stranding.  Findings relatively unchanged since 12/2019.  No transplant hydronephrosis. Normal pancreas.  No gallstones   CT Abdo/pelvis without contrast 11/25/2019 1. Again noted is a markedly enlarged right lower quadrant renal graft with diffuse edema and surrounding fat stranding. 2. Increase in volume of ascites. If there is a clinical concern for peritonitis consider further evaluation with diagnostic paracentesis. 3. Increase in diffuse subcutaneous edema involving the anterior abdominal wall. 4. Bony stigmata of renal osteodystrophy.   [place] 01/08/2020 1. Contracted gallbladder without shadowing stone or biliary dilatation 2. Question subtle contour nodularity of the liver as could be seen with early changes of cirrhosis, correlate with LFTs. Small free fluid in the right upper quadrant adjacent to the  liver 3. Atrophic echogenic native right kidney  Echo 12/21/2020:  1. Left ventricular ejection fraction, by estimation, is 60 to 65%. The left ventricle has normal function. The left ventricle has no regional wall motion abnormalities. There is severe concentric left ventricular hypertrophy. Left ventricular diastolic parameters are consistent with Grade I diastolic dysfunction (impaired relaxation). The average left ventricular global longitudinal strain is -9.3 %. The global longitudinal strain is abnormal. 2. Right ventricular systolic function is normal. The right ventricular size is normal. There is normal pulmonary artery systolic pressure. 3. The mitral valve is degenerative. No evidence of mitral valve regurgitation. No evidence of mitral stenosis. 4. The aortic valve is tricuspid. Aortic valve regurgitation is not visualized. No aortic stenosis is present. 5. The inferior vena cava is normal in size with greater than 50% respiratory variability, suggesting right atrial pressure of 3 mmHg   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   Anemia    Anemia associated with chronic renal failure    Anorectal disorder 06/08/2020   Anorectal pain 06/08/2020   Anxiety    Back pain    Bloating 04/16/2018   Cancer ([place])    pre cervical   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    of the [place]   Complications due to cardiac device, implant, and graft 09/02/2012  Condyloma of female genitalia    Dehydration 01/19/2019   Depression    Dialysis patient [facility])    Drug-induced bleeding disorder ([place]) 01/19/2019   DVT of axillary vein, acute left ([place]) 08/02/2012   Dysmenorrhea 10/19/2015   Dyspnea on exertion 01/19/2019   Dysuria    ESRD (end stage renal disease) ([place])    sp transplant x 2, did mix of HD and PD from 2001- 2008   Essential hypertension with goal blood pressure less  than 130/80    Fatigue    Gastritis, acute    Gout    History of parathyroidectomy ([place]) 10/29/2018   History of renal transplant    HTN (hypertension) 01/19/2019   Hypercalcemia    Hypercholesterolemia    Hyperlipidemia    Hypokalemia    Hypomagnesemia    Hypothyroid 01/19/2019   IBS (irritable bowel syndrome)    Immunosuppressive management encounter following kidney transplant 10/29/2018   Internal hemorrhoid 04/22/2018   Iron deficiency anemia 10/19/2015   Irregular uterine bleeding 02/13/2016   Kidney transplant recipient 10/29/2018   1st transplant [facility]) was 1995- 2001.  Did HD/ PD mix from 2001- 2008. 2nd transplant ([facility]/ [facility]) was in 2008 and is still working as of 12/2021. F/b Dr Hollie Salk ([place]) and [facility] [place] transplant team.   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)    Secondary hypertension due to renal disease 10/29/2018   Sesamoiditis 07/03/2018   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    Transplanted organ removal status 10/29/2018   Unstable angina ([place]) 01/19/2019   Urinary tract infection    Vulvar dystrophy    Yeast dermatitis 08/14/2018    Past Surgical History:  Procedure Laterality Date   AV FISTULA PLACEMENT     COLONOSCOPY  11/28/2005   [facility]   COLONOSCOPY  04/13/2016   Dr Orlena Sheldon   ESOPHAGOGASTRODUODENOSCOPY  07/31/2017   Distal esophageal ulcers (likely due to gastroeesphageal reflix-biopsied).  Small hiatal hernia. Erosive gastritis.   HEMORRHOID SURGERY     2019, and 04/29/2020   INSERTION OF DIALYSIS CATHETER     KIDNEY TRANSPLANT  1995, 2008   KNEE SURGERY Left 2020   PARATHYROIDECTOMY  2004   Portion on the parathyroid gland transplanted in R forearm   REMOVAL OF A DIALYSIS CATHETER     SIMPLE VULVECTOMY  06/19/2011   Partial simple left   SKIN CANCER EXCISION  2016   Per pt, area removed on scalp showed squamous cell carcinoma   TUBAL LIGATION      Prior to Admission medications   Medication Sig Start Date End Date Taking? Authorizing Provider  acetaminophen (TYLENOL) 500 MG tablet Take 500 mg by mouth every 6 (six) hours as needed for headache (pain).   Yes [provider]  allopurinol (ZYLOPRIM) 100 MG tablet Take 100 mg by mouth daily. 10/13/20  Yes [provider]  ALPRAZolam Duanne Moron) 0.5 MG tablet Take 0.5 mg by  mouth at bedtime.  05/31/15  Yes [provider]  aspirin-acetaminophen-caffeine (EXCEDRIN MIGRAINE) [phone] MG tablet Take 1 tablet by mouth every 6 (six) hours as needed for headache. 12/22/21  Yes Antonieta Pert, MD  carvedilol (COREG) 25 MG tablet Take 25 mg by mouth 2 (two) times daily with a meal. 10/26/21  Yes [provider]  famotidine (PEPCID) 20 MG tablet Take 1 tablet (20 mg total) by mouth daily. 07/19/21  Yes Jackquline Denmark, MD  furosemide (LASIX) 40 MG tablet Take 40 mg by mouth daily as needed for fluid or edema.    Yes [provider]  hydrOXYzine (ATARAX) 25 MG tablet Take 25-50 mg by mouth 2 (two) times daily as needed for dizziness or anxiety. 10/25/21  Yes [provider]  isosorbide mononitrate (IMDUR) 30 MG 24 hr tablet Take 1 tablet (30 mg total) by mouth every morning. 11/06/21  Yes Revankar, Reita Cliche, MD  magnesium gluconate (MAGONATE) 500 MG tablet Take 500 mg by mouth every evening.   Yes [provider]  medroxyPROGESTERone (PROVERA) 10 MG tablet Take 10 mg by mouth daily. 04/09/17   Yes [provider]  mycophenolate (MYFORTIC) 360 MG TBEC EC tablet Take 360 mg by mouth 2 (two) times daily. 02/24/19  Yes [provider]  ondansetron (ZOFRAN-ODT) 4 MG disintegrating tablet Take 4 mg by mouth as needed for nausea/vomiting. 03/25/18  Yes [provider]  potassium chloride SA (KLOR-CON M) 20 MEQ tablet Take 20 mEq by mouth daily. 06/08/21  Yes [provider]  predniSONE (DELTASONE) 5 MG tablet Take 5 mg by mouth daily.     Yes [provider]  sertraline (ZOLOFT) 50 MG tablet Take 50 mg by mouth daily.   Yes [provider]  sevelamer carbonate (RENVELA) 800 MG tablet Take 800 mg by mouth 3 (three) times daily with meals. 01/04/22  Yes [provider]  simvastatin (ZOCOR) 5 MG tablet Take 5 mg by mouth at bedtime. 11/08/21  Yes [provider]  sodium bicarbonate 650 MG tablet Take 1,300 mg by mouth every evening. 12/14/20  Yes [provider]  tacrolimus (PROGRAF) 0.5 MG capsule Take 0.5 mg by mouth 2 (two) times daily. Take along with 1 mg capsule=1.5 mg BID   Yes [provider]  tacrolimus (PROGRAF) 1 MG capsule Take 1 mg by mouth 2 (two) times daily. Take along with 0.5 mg capsule=1.5 mg BID 03/20/17  Yes [provider]  traZODone (DESYREL) 100 MG tablet Take 100 mg by mouth at bedtime. 10/05/21  Yes [provider]  Colchicine 0.6 MG CAPS Take 1 capsule by mouth daily. 01/24/20 02/28/20  [provider]    Current Facility-Administered Medications  Medication Dose Route Frequency Provider Last Rate Last Admin   albuterol (VENTOLIN HFA) 108 (90 Base) MCG/ACT inhaler 2 puff  2 puff Inhalation Q2H PRN Shalhoub, Sherryll Burger, MD       allopurinol (ZYLOPRIM) tablet 100 mg  100 mg Oral Daily Shalhoub, Sherryll Burger, MD       ALPRAZolam Duanne Moron) tablet 0.5 mg  0.5 mg Oral QHS Shalhoub, Sherryll Burger, MD       carvedilol (COREG) tablet 25 mg  25 mg Oral BID WC Shalhoub, Sherryll Burger, MD        famotidine (PEPCID) tablet 20 mg  20 mg Oral Daily Shalhoub, Sherryll Burger, MD       hydrALAZINE (APRESOLINE) injection 10 mg  10 mg Intravenous Q6H PRN Shalhoub, Sherryll Burger, MD  hydrOXYzine (ATARAX) tablet 25-50 mg  25-50 mg Oral BID PRN Shalhoub, Sherryll Burger, MD       isosorbide mononitrate (IMDUR) 24 hr tablet 30 mg  30 mg Oral q morning Shalhoub, Sherryll Burger, MD       magnesium sulfate IVPB 2 g 50 mL  2 g Intravenous Once Shawna Clamp, MD       mycophenolate (MYFORTIC) EC tablet 360 mg  360 mg Oral BID Shalhoub, Sherryll Burger, MD       ondansetron [facility]) tablet 4 mg  4 mg Oral Q6H PRN Shalhoub, Sherryll Burger, MD       Or   ondansetron [facility]) injection 4 mg  4 mg Intravenous Q6H PRN Shalhoub, Sherryll Burger, MD       piperacillin-tazobactam (ZOSYN) IVPB 2.25 g  2.25 g Intravenous Q8H Shalhoub, Sherryll Burger, MD       polyethylene glycol (MIRALAX / GLYCOLAX) packet 17 g  17 g Oral Daily PRN Shalhoub, Sherryll Burger, MD       predniSONE (DELTASONE) tablet 5 mg  5 mg Oral Daily Shalhoub, Sherryll Burger, MD       sertraline (ZOLOFT) tablet 50 mg  50 mg Oral Daily Shalhoub, Sherryll Burger, MD       sevelamer carbonate (RENVELA) tablet 800 mg  800 mg Oral TID WC Shalhoub, Sherryll Burger, MD       simvastatin (ZOCOR) tablet 5 mg  5 mg Oral QHS Shalhoub, Sherryll Burger, MD       sodium bicarbonate tablet 1,300 mg  1,300 mg Oral QPM Shalhoub, Sherryll Burger, MD       tacrolimus (PROGRAF) capsule 1.5 mg  1.5 mg Oral BID Shalhoub, Sherryll Burger, MD       traZODone (DESYREL) tablet 100 mg  100 mg Oral QHS Shalhoub, Sherryll Burger, MD       Current Outpatient Medications  Medication Sig Dispense Refill   acetaminophen (TYLENOL) 500 MG tablet Take 500 mg by mouth every 6 (six) hours as needed for headache (pain).     allopurinol (ZYLOPRIM) 100 MG tablet Take 100 mg by mouth daily.     ALPRAZolam (XANAX) 0.5 MG tablet Take 0.5 mg by mouth at bedtime.   1   aspirin-acetaminophen-caffeine (EXCEDRIN MIGRAINE) 250-250-65 MG tablet Take 1 tablet by mouth every 6 (six) hours as  needed for headache. 30 tablet 0   carvedilol (COREG) 25 MG tablet Take 25 mg by mouth 2 (two) times daily with a meal.     famotidine (PEPCID) 20 MG tablet Take 1 tablet (20 mg total) by mouth daily. 90 tablet 3   furosemide (LASIX) 40 MG tablet Take 40 mg by mouth daily as needed for fluid or edema.      hydrOXYzine (ATARAX) 25 MG tablet Take 25-50 mg by mouth 2 (two) times daily as needed for dizziness or anxiety.     isosorbide mononitrate (IMDUR) 30 MG 24 hr tablet Take 1 tablet (30 mg total) by mouth every morning. 90 tablet 3   magnesium gluconate (MAGONATE) 500 MG tablet Take 500 mg by mouth every evening.     medroxyPROGESTERone (PROVERA) 10 MG tablet Take 10 mg by mouth daily.  3   mycophenolate (MYFORTIC) 360 MG TBEC EC tablet Take 360 mg by mouth 2 (two) times daily.     ondansetron (ZOFRAN-ODT) 4 MG disintegrating tablet Take 4 mg by mouth as needed for nausea/vomiting.     potassium chloride SA (KLOR-CON M) 20 MEQ tablet Take 20 mEq by  mouth daily.     predniSONE (DELTASONE) 5 MG tablet Take 5 mg by mouth daily.       sertraline (ZOLOFT) 50 MG tablet Take 50 mg by mouth daily.     sevelamer carbonate (RENVELA) 800 MG tablet Take 800 mg by mouth 3 (three) times daily with meals.     simvastatin (ZOCOR) 5 MG tablet Take 5 mg by mouth at bedtime.     sodium bicarbonate 650 MG tablet Take 1,300 mg by mouth every evening.     tacrolimus (PROGRAF) 0.5 MG capsule Take 0.5 mg by mouth 2 (two) times daily. Take along with 1 mg capsule=1.5 mg BID     tacrolimus (PROGRAF) 1 MG capsule Take 1 mg by mouth 2 (two) times daily. Take along with 0.5 mg capsule=1.5 mg BID  6   traZODone (DESYREL) 100 MG tablet Take 100 mg by mouth at bedtime.      Allergies as of 02/14/2022 - Review Complete 02/14/2022  Allergen Reaction Noted   Bactrim [sulfamethoxazole-trimethoprim] Anaphylaxis 04/11/2017   Sulfamethoxazole Anaphylaxis 01/16/2019   Hydrogen peroxide Rash 06/08/2020   Labetalol  06/08/2020    Wound dressing adhesive Hives 10/15/2015   Zolpidem  06/08/2020   Amlodipine Swelling 10/13/2021   Hyoscyamine Other (See Comments) 01/08/2020   Keflex [cephalexin] Hives and Swelling 06/17/2015   Adhesive [tape] Rash 08/30/2011   Imuran [azathioprine sodium] Rash 12/05/2011   Neosporin [bacitracin-polymyxin b] Rash and Other (See Comments) 04/27/2020   Tramadol Rash 06/17/2015   Warfarin sodium Rash 12/05/2011    Family History  Problem Relation Age of Onset   Hypertension Mother    Hyperlipidemia Mother    Diabetes Father    Rectal cancer Maternal Grandmother    Colon cancer Maternal Grandmother    Esophageal cancer Neg Hx     Social History   Socioeconomic History   Marital status: Divorced    Spouse name: Not on file   Number of children: Not on file   Years of education: Not on file   Highest education level: Not on file  Occupational History   Not on file  Tobacco Use   Smoking status: Never   Smokeless tobacco: Never  Vaping Use   Vaping Use: Never used  Substance and Sexual Activity   Alcohol use: No   Drug use: No   Sexual activity: Not on file  Other Topics Concern   Not on file  Social History Narrative   Not on file   Social Determinants of Health   Financial Resource Strain: Not on file  Food Insecurity: Not on file  Transportation Needs: Not on file  Physical Activity: Not on file  Stress: Not on file  Social Connections: Not on file  Intimate Partner Violence: Not on file    Review of Systems: Gen: Denies fever, sweats or chills. No weight loss.  CV: Denies chest pain, palpitations or edema. Resp: No hemoptysis. GI: See HPI..   GU : + Dysuria.  MS: Denies joint pain, muscles aches or weakness. Derm: + Back pain.  Psych: + Anxiety and depression. Heme: Denies easy bruising, bleeding. Neuro:  Denies headaches, dizziness or paresthesias. Endo:  Denies any problems with DM, thyroid or adrenal function.  Physical Exam: Vital signs in  last 24 hours: Temp:  [97.9 F (36.6 C)-98.3 F (36.8 C)] 97.9 F (36.6 C) (06/15 0700) Pulse Rate:  [64-84] 64 (06/15 0700) Resp:  [17-25] 19 (06/15 0700) BP: (137-188)/(82-112) 174/102 (06/15 0700) SpO2:  [97 %-100 %]  98 % (06/15 0700) Weight:  [75.8 kg] 75.8 kg (06/14 1452)   General:  Alert 46 year old female in no acute distress Head:  Normocephalic and atraumatic. Eyes:  No scleral icterus. Conjunctiva pink. Ears:  Normal auditory acuity. Nose:  No deformity, discharge or lesions. Mouth:  Dentition intact.  White patches on tongue consistent with oral candidiasis Neck:  Supple. No lymphadenopathy or thyromegaly.  Lungs: Sounds clear throughout. Heart: Rate and rhythm, no murmurs. Abdomen: Soft, protuberant.  RLQ tenderness without rebound or guarding.  Numerous scars throughout the lower abdomen. Kidney transplant to RLQ.  Positive bowel sounds to all 4 quadrants. Rectal: Deferred. Musculoskeletal:  Symmetrical without gross deformities.  Pulses:  Normal pulses noted. Extremities:  Without clubbing or edema. Neurologic:  Alert and  oriented x 4. No focal deficits.  Skin:  Intact without significant lesions or rashes. Psych:  Alert and cooperative. Normal mood and affect.  Intake/Output from previous day: 06/14 0701 - 06/15 0700 In: 42.6 [IV Piggyback:42.6] Out: -  Intake/Output this shift: No intake/output data recorded.  Lab Results: Recent Labs    02/14/22 1539 02/15/22 0443  WBC 14.7* 11.1*  HGB 10.5* 9.9*  HCT 33.4* 31.4*  PLT 198 181   BMET Recent Labs    02/14/22 1539 02/15/22 0330  NA 139 139  K 4.4 5.3*  CL 112* 111  CO2 19* 21*  GLUCOSE 106* 98  BUN 40* 43*  CREATININE 3.90* 4.05*  CALCIUM 8.1* 7.7*   LFT Recent Labs    02/15/22 0330  PROT 6.2*  ALBUMIN 3.5  AST 14*  ALT 10  ALKPHOS 42  BILITOT 0.6  BILIDIR 0.1  IBILI 0.5   PT/INR No results for input(s): "LABPROT", "INR" in the last 72 hours. Hepatitis Panel No results for  input(s): "HEPBSAG", "HCVAB", "HEPAIGM", "HEPBIGM" in the last 72 hours.    Studies/Results: CT Renal Stone Study  Result Date: 02/14/2022 CLINICAL DATA:  Flank pain, kidney stone suspected. Right back pain, right flank pain. EXAM: CT ABDOMEN AND PELVIS WITHOUT CONTRAST TECHNIQUE: Multidetector CT imaging of the abdomen and pelvis was performed following the standard protocol without IV contrast. RADIATION DOSE REDUCTION: This exam was performed according to the departmental dose-optimization program which includes automated exposure control, adjustment of the mA and/or kV according to patient size and/or use of iterative reconstruction technique. COMPARISON:  11/19/2021 FINDINGS: Lower chest: No acute abnormality. Hepatobiliary: No focal liver abnormality is seen. No gallstones, gallbladder wall thickening, or biliary dilatation. Pancreas: There is marked dilation of the pancreatic duct within the head of the pancreas which measures up to 12 mm in diameter, new since prior examination. The duct within the body and tail the pancreas appears decompressed. This is not well characterized on this noncontrast examination. No peripancreatic inflammatory changes or fluid collections are identified. No intraparenchymal calcifications are seen. The pancreatic parenchyma is preserved. Spleen: Unremarkable Adrenals/Urinary Tract: The adrenal glands are unremarkable. The native kidneys are markedly atrophic in keeping with end-stage renal disease. A densely calcified failed transplant kidney is seen within the left iliac fossa. Within the right iliac fossa is a transplant kidney which demonstrates marked enlargement and cortical thickening, diffuse cortical hypoattenuation, and extensive perinephric inflammatory stranding. These findings can be seen the setting of acute pyelonephritis or graft rejection. The findings appear similar to prior examination. There is no hydronephrosis. No discrete drainable perinephric fluid  collections are identified. No intrarenal or ureteral calculi are identified. A 7.8 cm probable simple cortical cyst is again identified within  the transplant kidney. The bladder is unremarkable. Stomach/Bowel: Mild sigmoid diverticulosis. The stomach, small bowel, and large bowel are otherwise unremarkable. Appendix normal. No free intraperitoneal gas or fluid. Vascular/Lymphatic: Aortic atherosclerosis. No enlarged abdominal or pelvic lymph nodes. Reproductive: Uterus and bilateral adnexa are unremarkable. Other: Asymmetric subcutaneous edema within the right lower quadrant abdominal wall superficial to the transplant kidney is again seen. Infiltrative changes within the periumbilical region may be post surgical in nature. These appear unchanged. No abdominal wall hernia. No ascites. Musculoskeletal: No acute or significant osseous findings. IMPRESSION: 1. Interval development of marked dilation of the pancreatic duct within the head of the pancreas measuring up to 12 mm in diameter. This is not well characterized on this noncontrast examination. No peripancreatic inflammatory changes or fluid collections are identified. Differential considerations include a main duct IPMN, postinflammatory stricture, or possibly a pancreatic neoplasm. This could be further assessed with contrast enhanced MRI examination or endoscopic ultrasound examination. 2. Stable appearance of the right iliac fossa transplant kidney demonstrating marked enlargement, cortical thickening, and extensive perinephric inflammatory stranding. These findings can be seen the setting of acute pyelonephritis or graft rejection. Correlation with urinalysis and urine culture may be helpful. No hydronephrosis or discrete drainable perinephric fluid collections identified. 3. Mild sigmoid diverticulosis. 4. Asymmetric subcutaneous edema within the right lower quadrant abdominal wall superficial to the transplant kidney is again seen. Infiltrative changes  within the periumbilical region may be post surgical in nature. These appear unchanged. No abdominal wall hernia. Aortic Atherosclerosis (ICD10-I70.0). Electronically Signed   By: Fidela Salisbury M.D.   On: 02/14/2022 18:08   US Abdomen Limited RUQ (LIVER/GB)  Result Date: 02/14/2022 CLINICAL DATA:  Right upper quadrant pain for 2 days EXAM: ULTRASOUND ABDOMEN LIMITED RIGHT UPPER QUADRANT COMPARISON:  None Available. FINDINGS: Gallbladder: Mildly contracted gallbladder. No gallstones or wall thickening visualized. No sonographic Murphy sign noted by sonographer. Common bile duct: Diameter: 0.3 cm Liver: No focal lesion identified. Somewhat coarse contour of the liver. Increased parenchymal echogenicity. Portal vein is patent on color Doppler imaging with normal direction of blood flow towards the liver. Other: None. IMPRESSION: 1. No evidence of cholelithiasis or acute cholecystitis. 2. Somewhat coarse contour of the liver with increased parenchymal echogenicity. Findings suggest cirrhosis. Electronically Signed   By: Delanna Ahmadi M.D.   On: 02/14/2022 17:55   DG Chest 2 View  Result Date: 02/14/2022 CLINICAL DATA:  Shortness of breath. EXAM: CHEST - 2 VIEW COMPARISON:  October 29, 2021. FINDINGS: Mild streaky bibasilar opacities. No confluent consolidation no visible pleural effusions or pneumothorax. Similar cardiomediastinal silhouette. No displaced fracture. IMPRESSION: Mild streaky bibasilar opacities, favor atelectasis. No confluent consolidation. Electronically Signed   By: Margaretha Sheffield M.D.   On: 02/14/2022 15:12    IMPRESSION/PLAN:  45) 46 year old female with a history of GERD and chronic upper/lower abdominal pain admitted to the hospital with nausea, dry heaves with worsening back/flank and generalized abdominal pain. CTAP without contrast showed marked dilatation of the pancreatic duct within the head of the pancreas measuring up to 12 mm in diameter which was not well characterized on  noncontrast study. Differential considerations include a main duct IPMN, postinflammatory stricture or possibly a pancreatic neoplasm. Normal LFTs. Lipase 90. -Clear liquid diet -IV fluids per the hospitalist/nephrology -Pantoprazole 40 mg IV q. 24 -Ondansetron 4 mg p.o. or IV every 6 hours. -Abdominal MRI/MRCP without contrast -Pain management per the hospitalist -The recommendation to be determined after abdominal MRI/MRCP results reviewed, if significant pancreatic  ductal dilatation further identified she may require EUS +/-ERCP. await further recommendations per Dr. Tarri Glenn  2) S/P kidney transplant x 2, failed transplant in 1995, second transplant 2008 with worsening renal function.  On multiple immunosuppressants.  3) IBS, alternating diarrhea and constipation -Eventual colonoscopy likely as an outpatient -MiraLAX nightly.  4) Chronic anemia, secondary to renal disease. Rectal bleeding x 1 on 6/14.  -Continue to monitor the patient for GI bleeding -See plan in  #3  5) Cirrhosis per abdominal sono. Normal platelet count. Normal LFTs.  -Cirrhosis work-up as an outpatient  6) Oral candidiasis  -Defer treatment to the hospitalist  7) Leukocytosis, rule out UTI/pyelonephritis.  Chronic prednisone use likely contributing to   Noralyn Pick  02/15/2022, 10:20AM

## 2022-02-15 NOTE — Progress Notes (Signed)
Pt in 1530

## 2022-02-15 NOTE — ED Notes (Signed)
Pt to MRI

## 2022-02-15 NOTE — Assessment & Plan Note (Signed)
   Continue home regimen of Coreg  As needed hydralazine for markedly elevated blood pressure

## 2022-02-15 NOTE — H&P (Addendum)
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  Patient is currently following with Dr. Hollie Salk with [facility] as well as [facility] seeking a third kidney transplant.  Patient presents to [facility] emergency department complaints of right chest pain and right flank pain with dysuria.  Patient explains that approximately 2 days ago she began to experience abdominal pain.  Describes the abdominal pain as epigastric in location and wrapping around her abdomen to her mid back.  In the days that followed patient rather quickly developed associated right flank pain.  She additionally describes the right flank pain as sharp in quality, severe in intensity and worse with movement.  Patient additionally complains of some mild shortness of breath but attributes this to the degree of pain that she is having.  Patient has also felt nauseated over the span of time with poor oral intake and two episodes of nonbilious nonbloody vomiting.  On further questioning patient denies recent ingestion of undercooked food, recent travel, sick contacts or fever.  Patient symptoms continued to worsen until she eventually was in the hospital emergency department for  evaluation.    Upon evaluation in the emergency department patient underwent a rapid wide-ranging work-up.  CT renal stone protocol revealed marked dilation of the pancreatic duct within the head of the pancreas measuring up to 12 mm in diameter concerning for possible main duct IPMN, postinflammatory stricture or neoplasm.  Considering substantial leukocytosis, ER provider was concerned for urinary tract infection and gave a dose of intravenous Zosyn.  Due to patient's ongoing discomfort and concerns for infectious process the hospitalist group was then called to assess the patient for admission to the hospital   Review of Systems: Review of Systems  Respiratory:  Positive for shortness of breath.   Gastrointestinal:  Positive for abdominal pain and vomiting.  Genitourinary:  Positive for flank pain.  All other systems reviewed and are negative.    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   Anemia    Anemia associated with chronic renal failure    Anorectal disorder 06/08/2020   Anorectal pain 06/08/2020   Anxiety    Back pain    Bloating 04/16/2018   Cancer ([place])    pre cervical   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    of the [place]   Complications due to cardiac device, implant, and graft 09/02/2012   Condyloma of female genitalia    Dehydration 01/19/2019   Depression    Dialysis patient [facility])    Drug-induced bleeding disorder ([place]) 01/19/2019   DVT of axillary vein, acute left ([place]) 08/02/2012   Dysmenorrhea 10/19/2015   Dyspnea on exertion 01/19/2019  Dysuria    ESRD (end stage renal disease) ([place])    sp transplant x 2, did mix of HD and PD from 2001- 2008   Essential hypertension with goal blood pressure less than 130/80    Fatigue    Gastritis, acute    Gout    History of parathyroidectomy ([place]) 10/29/2018   History of renal transplant    HTN (hypertension)  01/19/2019   Hypercalcemia    Hypercholesterolemia    Hyperlipidemia    Hypokalemia    Hypomagnesemia    Hypothyroid 01/19/2019   IBS (irritable bowel syndrome)    Immunosuppressive management encounter following kidney transplant 10/29/2018   Internal hemorrhoid 04/22/2018   Iron deficiency anemia 10/19/2015   Irregular uterine bleeding 02/13/2016   Kidney transplant recipient 10/29/2018   1st transplant [facility]) was 1995- 2001.  Did HD/ PD mix from 2001- 2008. 2nd transplant ([facility]) was in 2008 and is still working as of 12/2021. F/b Dr Hollie Salk ([place]) and [facility].   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   Other chronic sinusitis 10/29/2018   Palpitations    Pancreatitis    Perianal lesion 04/22/2018   Phlebitis and thrombophlebitis of other sites 08/02/2012   Plantar fasciitis 07/03/2018   Pruritus ani 06/26/2018   Right lower quadrant abdominal pain 10/29/2018   [facility] spotted fever    Secondary hyperparathyroidism of renal origin [facility])    Secondary hypertension due to renal disease 10/29/2018   Sesamoiditis 07/03/2018   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    Transplanted organ removal status 10/29/2018   Unstable angina ([place]) 01/19/2019   Urinary tract infection    Vulvar dystrophy    Yeast dermatitis 08/14/2018    Past Surgical History:  Procedure Laterality Date   AV FISTULA PLACEMENT     COLONOSCOPY  11/28/2005   [facility]   COLONOSCOPY  04/13/2016   Dr Orlena Sheldon   ESOPHAGOGASTRODUODENOSCOPY  07/31/2017   Distal esophageal ulcers (likely due to gastroeesphageal reflix-biopsied). Small hiatal hernia. Erosive gastritis.   HEMORRHOID SURGERY     2019, and 04/29/2020   INSERTION OF DIALYSIS CATHETER     KIDNEY TRANSPLANT  1995, 2008    KNEE SURGERY Left 2020   PARATHYROIDECTOMY  2004   Portion on the parathyroid gland transplanted in R forearm   REMOVAL OF A DIALYSIS CATHETER     SIMPLE VULVECTOMY  06/19/2011   Partial simple left   SKIN CANCER EXCISION  2016   Per pt, area removed on scalp showed squamous cell carcinoma   TUBAL LIGATION      Social History:  reports that she has never smoked. She has never used smokeless tobacco. She reports that she does not drink alcohol and does not use drugs.  Allergies  Allergen Reactions   Bactrim [Sulfamethoxazole-Trimethoprim] Anaphylaxis   Sulfamethoxazole Anaphylaxis   Hydrogen Peroxide Rash   Labetalol    Wound Dressing Adhesive Hives   Zolpidem    Amlodipine Swelling   Hyoscyamine Other (See Comments)    Dizziness    Keflex [Cephalexin] Hives and Swelling    Per pt, her face and lips swell up and she develops hives   Adhesive [Tape] Rash   Imuran [Azathioprine Sodium] Rash   Neosporin [Bacitracin-Polymyxin B] Rash and Other (  See Comments)    Can use that for awhile, but will eventually develop a rash    Tramadol Rash   Warfarin Sodium Rash    Family History  Problem Relation Age of Onset   Hypertension Mother    Hyperlipidemia Mother    Diabetes Father    Rectal cancer Maternal Grandmother    Colon cancer Maternal Grandmother    Esophageal cancer Neg Hx     Prior to Admission medications   Medication Sig Start Date End Date Taking? Authorizing Provider  acetaminophen (TYLENOL) 500 MG tablet Take 500 mg by mouth every 6 (six) hours as needed for headache (pain).   Yes [provider]  allopurinol (ZYLOPRIM) 100 MG tablet Take 100 mg by mouth daily. 10/13/20  Yes [provider]  ALPRAZolam Duanne Moron) 0.5 MG tablet Take 0.5 mg by mouth at bedtime.  05/31/15  Yes [provider]  aspirin-acetaminophen-caffeine (EXCEDRIN MIGRAINE) [phone] MG tablet Take 1 tablet by mouth every 6 (six) hours as needed for headache. 12/22/21  Yes Antonieta Pert, MD  carvedilol (COREG) 25 MG tablet Take 25 mg by mouth 2 (two) times daily with a meal. 10/26/21  Yes [provider]  famotidine (PEPCID) 20 MG tablet Take 1 tablet (20 mg total) by mouth daily. 07/19/21  Yes Jackquline Denmark, MD  furosemide (LASIX) 40 MG tablet Take 40 mg by mouth daily as needed for fluid or edema.    Yes [provider]  hydrOXYzine (ATARAX) 25 MG tablet Take 25-50 mg by mouth 2 (two) times daily as needed for dizziness or anxiety. 10/25/21  Yes [provider]  isosorbide mononitrate (IMDUR) 30 MG 24 hr tablet Take 1 tablet (30 mg total) by mouth every morning. 11/06/21  Yes Revankar, Reita Cliche, MD  magnesium gluconate (MAGONATE) 500 MG tablet Take 500 mg by mouth every evening.   Yes [provider]  medroxyPROGESTERone (PROVERA) 10 MG tablet Take 10 mg by mouth daily. 04/09/17  Yes [provider]  mycophenolate (MYFORTIC) 360 MG TBEC EC tablet Take 360 mg by mouth 2 (two) times daily. 02/24/19  Yes [provider]  ondansetron (ZOFRAN-ODT) 4 MG disintegrating tablet Take 4 mg by mouth as needed for nausea/vomiting. 03/25/18  Yes [provider]  potassium chloride SA (KLOR-CON M) 20 MEQ tablet Take 20 mEq by mouth daily. 06/08/21  Yes [provider]  predniSONE (DELTASONE) 5 MG tablet Take 5 mg by mouth daily.     Yes [provider]  sertraline (ZOLOFT) 50 MG tablet Take 50 mg by mouth daily.   Yes [provider]  sevelamer carbonate (RENVELA) 800 MG tablet Take 800 mg by mouth 3 (three) times daily with meals. 01/04/22  Yes [provider]  simvastatin (ZOCOR) 5 MG tablet Take 5 mg by mouth at bedtime. 11/08/21  Yes [provider]  sodium bicarbonate 650 MG tablet Take 1,300 mg by mouth every evening. 12/14/20  Yes [provider]  tacrolimus (PROGRAF) 0.5 MG capsule Take 0.5 mg by mouth 2 (two) times daily. Take along with 1 mg capsule=1.5 mg BID   Yes [provider]  tacrolimus (PROGRAF) 1 MG capsule Take 1 mg by mouth 2 (two) times daily. Take along with 0.5 mg capsule=1.5 mg BID 03/20/17  Yes [provider]  traZODone (DESYREL) 100 MG tablet Take 100 mg by mouth at bedtime. 10/05/21  Yes [provider]  Colchicine 0.6 MG CAPS Take 1 capsule by mouth daily. 01/24/20 02/28/20  [provider]    Physical Exam:  Vitals:   02/15/22 0600 02/15/22 0615 02/15/22 0630 02/15/22 0700  BP: (!) 147/103  (!) 149/99 (!) 174/102  Pulse: 72 71 71 64  Resp: (!) 24 (!) 21 (!) 23 19  Temp:    97.9 F (36.6 C)  TempSrc:      SpO2: 99% 99% 99% 98%  Weight:      Height:        Constitutional: Awake alert and oriented x3, patient is in distress due to abdominal pain. Skin: no rashes, no lesions, poor skin turgor noted. Eyes: Pupils are equally reactive to light.  No evidence of scleral icterus or conjunctival pallor.  ENMT: Slightly dry mucous membranes noted.  Posterior pharynx clear of any exudate or lesions.   Neck: normal, supple, no masses, no thyromegaly.  No evidence of jugular venous distension.   Respiratory: clear to auscultation bilaterally, no wheezing, no crackles. Normal respiratory effort. No accessory muscle use.  Cardiovascular: Regular rate and rhythm, no murmurs / rubs / gallops. No extremity edema. 2+ pedal pulses. No carotid bruits.  Chest:   Nontender without crepitus or deformity.   Back:   Nontender without crepitus or deformity. Abdomen: Protuberant abdomen.  Notable generalized abdominal tenderness.  Significant right lower quadrant mass can be palpated that is somewhat tender to deep palpation.  Positive bowel sounds noted in all quadrants.   Musculoskeletal: No joint deformity upper and lower extremities. Good ROM, no contractures. Normal muscle tone.  Neurologic: CN 2-12 grossly intact. Sensation intact.  Patient moving all 4 extremities spontaneously.  Patient is following all commands.  Patient is  responsive to verbal stimuli.   Psychiatric: Patient exhibits normal mood with appropriate affect.  Patient seems to possess insight as to their current situation.    Data Reviewed:  I have personally reviewed and interpreted labs, imaging.  Significant findings are:  CBC revealing white blood cell count of 14.7, hemoglobin 9.5, hematocrit 33.4 D-dimer 0.85 Urinalysis revealing rare bacteria, 6-10 white blood cells per high-powered field, large leukocyte esterase CT renal stone study revealing interval development of marked dilatation of the pancreatic duct in the head of the pancreas measuring up to 12 mm in diameter with no peripancreatic phonatory changes or fluid collections identified..  Additional marked enlargement of the right iliac fossa transplant kidney that appears similar to prior examinations  EKG: Personally reviewed.  Rhythm is normal sinus rhythm with heart rate of 76 bpm.  No dynamic ST segment changes appreciated.   Assessment and Plan: * Abdominal pain Complicated presentation.  Patient with known history of failed renal transplant x2, now essentially CKD stage V with a chronically inflamed and edematous transplanted kidney with multiple bouts of pyelonephritis in the past. Urinalysis is equivocal however this immunocompromised patient is exhibiting a substantial leukocytosis with elevated procalcitonin being about concern for recurrent pyelonephritis.   Ucx 11/2021 grew Enterococcus  Furthermore, patient has a notably 12 mm dilated pancreatic duct and patient's symptoms of pain (epigastric , wrapping around to thoracic back at times) seem like this could be the culprit instead.  Patient has had intermittently elevated lipase levels in the past several months with Dr. Lyndel Safe mentioning late last year that it was in the thousands at one point .  In the meantime, urine cultures have been obtained and empiric intravenous Zosyn has been Noncontrast ERCP has been ordered after  discussion with Dr. Carlean Purl with [facility], his team will consult on this patient later in  the day If urine cultures remain without growth after 48 hours then antibiotic discontinuation can be considered   Pancreatic duct dilated Notable for new substantial dilation of the pancreatic duct on CT imaging, not present on abdominal imaging several months ago. Unclear etiology, see remainder of assessment and plan above.  Chronic kidney disease, stage V ([place]) Extremely advanced chronic kidney disease due to longstanding graft ejection. This is the patient's second graft rejection patient is currently being seen transplant clinic for consideration of a third transplant Strict intake and output monitoring Creatinine near baseline although overall creatinine has been worsening over the past several months. Minimizing nephrotoxic agents as much as possible Serial chemistries to monitor renal function and electrolytes We will obtain nephrology consultation in the morning considering patient's advanced disease   Secondary hyperparathyroidism of renal origin ([place]) Continue Sevelmer  Monitoring calcium and phosphate levels with serial chemistries  Failed kidney transplant Patient has undergone 2 separate transplants in the past. Most recent transplant was in 2015 patient since having developed Rejection once again Patient still continues to be on immunosuppressants including tacrolimus, mycophenolate and low-dose prednisone Overall patient's kidney function has continued to decline.   Patient follows with Dr. Hollie Salk in nephrology clinic.  Patient additionally follows up with [facility]  Essential hypertension Continue home regimen of Coreg As needed hydralazine for markedly elevated blood pressure  Mixed hyperlipidemia Continuing home regimen of lipid lowering therapy.   Anxiety disorder Continue home regimen of as needed hydroxyzine and alprazolam  GERD without  esophagitis Continue famotidine   Gout Continue allopurinol       Code Status:  Full code  code status decision has been confirmed with patient Family Communication: deferred   Consults: Dr. Carlean Purl - GI  Dr. Jonnie Finner - Nephrology  Severity of Illness:  The appropriate patient status for this patient is INPATIENT. Inpatient status is judged to be reasonable and necessary in order to provide the required intensity of service to ensure the patient's safety. The patient's presenting symptoms, physical exam findings, and initial radiographic and laboratory data in the context of their chronic comorbidities is felt to place them at high risk for further clinical deterioration. Furthermore, it is not anticipated that the patient will be medically stable for discharge from the hospital within 2 midnights of admission.   * I certify that at the point of admission it is my clinical judgment that the patient will require inpatient hospital care spanning beyond 2 midnights from the point of admission due to high intensity of service, high risk for further deterioration and high frequency of surveillance required.*  Author:  Vernelle Emerald MD  02/15/2022 8:48 AM

## 2022-02-15 NOTE — ED Notes (Signed)
Pt states she has her home meds with her and would rather take them instead of the medications provided by [facility]

## 2022-02-15 NOTE — Assessment & Plan Note (Signed)
Continue allopurinol 

## 2022-02-15 NOTE — Assessment & Plan Note (Signed)
.   Continuing home regimen of lipid lowering therapy.  

## 2022-02-15 NOTE — Progress Notes (Signed)
Pt transferred to 1434

## 2022-02-15 NOTE — Assessment & Plan Note (Signed)
   Continue Sevelmer   Monitoring calcium and phosphate levels with serial chemistries

## 2022-02-15 NOTE — Assessment & Plan Note (Signed)
   Continue home regimen of as needed hydroxyzine and alprazolam

## 2022-02-15 NOTE — Progress Notes (Signed)
Pharmacy Antibiotic Note  Ariel [name] is a 46 y.o. female admitted on 02/14/2022 with IAI.  Pharmacy has been consulted for zosyn dosing. WBC 14.7> 11.8, SCr 4.05 - failed renal tx x 2, now CKDV PCT 0.33, CRP neg, LA WNL AF CT: dilated pancreatic duct> ERCP planned  Plan: Zosyn 2.25 gm IV q8h F/u renal function, WBC, temp, cultures  Height: '4\' 11"'$  (149.9 cm) Weight: 75.8 kg (167 lb) IBW/kg (Calculated) : 43.2  Temp (24hrs), Avg:98.1 F (36.7 C), Min:97.9 F (36.6 C), Max:98.3 F (36.8 C)  Recent Labs  Lab 02/14/22 1539 02/14/22 2132 02/15/22 0325 02/15/22 0330 02/15/22 0443  WBC 14.7*  --   --   --  11.1*  CREATININE 3.90*  --   --  4.05*  --   LATICACIDVEN  --  1.3 1.3  --   --     Estimated Creatinine Clearance: 15.7 mL/min (A) (by C-G formula based on SCr of 4.05 mg/dL (H)).    Allergies  Allergen Reactions   Bactrim [Sulfamethoxazole-Trimethoprim] Anaphylaxis   Sulfamethoxazole Anaphylaxis   Hydrogen Peroxide Rash   Labetalol    Wound Dressing Adhesive Hives   Zolpidem    Amlodipine Swelling   Hyoscyamine Other (See Comments)    Dizziness    Keflex [Cephalexin] Hives and Swelling    Per pt, her face and lips swell up and she develops hives   Adhesive [Tape] Rash   Imuran [Azathioprine Sodium] Rash   Neosporin [Bacitracin-Polymyxin B] Rash and Other (See Comments)    Can use that for awhile, but will eventually develop a rash    Tramadol Rash   Warfarin Sodium Rash    Thank you for allowing pharmacy to be a part of this patient's care.  Eudelia Bunch, Pharm.D 02/15/2022 10:19 AM

## 2022-02-16 DIAGNOSIS — K8689 Other specified diseases of pancreas: Secondary | ICD-10-CM | POA: Diagnosis not present

## 2022-02-16 DIAGNOSIS — R933 Abnormal findings on diagnostic imaging of other parts of digestive tract: Secondary | ICD-10-CM | POA: Diagnosis not present

## 2022-02-16 DIAGNOSIS — R109 Unspecified abdominal pain: Secondary | ICD-10-CM | POA: Diagnosis not present

## 2022-02-16 LAB — BASIC METABOLIC PANEL
Anion gap: 11 (ref 5–15)
BUN: 44 mg/dL — ABNORMAL HIGH (ref 6–20)
CO2: 21 mmol/L — ABNORMAL LOW (ref 22–32)
Calcium: 8 mg/dL — ABNORMAL LOW (ref 8.9–10.3)
Chloride: 107 mmol/L (ref 98–111)
Creatinine, Ser: 3.71 mg/dL — ABNORMAL HIGH (ref 0.44–1.00)
GFR, Estimated: 15 mL/min — ABNORMAL LOW (ref >60)
Glucose, Bld: 87 mg/dL (ref 70–99)
Potassium: 4.6 mmol/L (ref 3.5–5.1)
Sodium: 139 mmol/L (ref 135–145)

## 2022-02-16 LAB — URINE CULTURE: Culture: 20000 — AB

## 2022-02-16 LAB — LIPASE, BLOOD: Lipase: 41 U/L (ref 11–51)

## 2022-02-16 NOTE — Progress Notes (Signed)
[facility] Progress Note  CC: Dilated pancreatic duct per CT  Subjective: She feels well this morning.  She is tolerating a renal diet.  No nausea or vomiting.  No abdominal pain today.  She continues to have chronic lower back pain.  No chest pain or shortness of breath.  No BM yesterday or today.   Objective:  Vital signs in last 24 hours: Temp:  [98.1 F (36.7 C)-98.2 F (36.8 C)] 98.2 F (36.8 C) (06/16 0521) Pulse Rate:  [67-71] 70 (06/16 0521) Resp:  [18-21] 19 (06/16 0521) BP: (150-166)/(78-110) 158/103 (06/16 0521) SpO2:  [98 %-100 %] 98 % (06/16 0521) Last BM Date : 02/14/22 General: Alert 46 year old female in no acute distress Heart: Regular rate and rhythm Pulm: Breath sounds clear throughout Abdomen: Protuberant, soft.  Nontender.  Positive bowel sounds to all 4 quadrants.  Numerous scars throughout the lower abdomen.  Transplanted kidney to the RLQ. Extremities:  Without edema. Neurologic:  Alert and  oriented x 4. Grossly normal neurologically. Psych:  Alert and cooperative. Normal mood and affect.  Intake/Output from previous day: 06/15 0701 - 06/16 0700 In: 742 [P.O.:642; IV Piggyback:100] Out: 600 [Urine:600] Intake/Output this shift: Total I/O In: 60 [P.O.:60] Out: 300 [Urine:300]  Lab Results: Recent Labs    02/14/22 1539 02/15/22 0443  WBC 14.7* 11.1*  HGB 10.5* 9.9*  HCT 33.4* 31.4*  PLT 198 181   BMET Recent Labs    02/14/22 1539 02/15/22 0330 02/15/22 1354 02/16/22 0336  NA 139 139  --  139  K 4.4 5.3* 5.3* 4.6  CL 112* 111  --  107  CO2 19* 21*  --  21*  GLUCOSE 106* 98  --  87  BUN 40* 43*  --  44*  CREATININE 3.90* 4.05*  --  3.71*  CALCIUM 8.1* 7.7*  --  8.0*   LFT Recent Labs    02/15/22 0330  PROT 6.2*  ALBUMIN 3.5  AST 14*  ALT 10  ALKPHOS 42  BILITOT 0.6  BILIDIR 0.1  IBILI 0.5   PT/INR No results for input(s): "LABPROT", "INR" in the last 72 hours. Hepatitis Panel No results for  input(s): "HEPBSAG", "HCVAB", "HEPAIGM", "HEPBIGM" in the last 72 hours.  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 EXAM: MRI ABDOMEN WITHOUT CONTRAST  (INCLUDING MRCP) TECHNIQUE: Multiplanar multisequence MR imaging of the abdomen was performed. Heavily T2-weighted images of the biliary and pancreatic ducts were obtained, and three-dimensional MRCP images were rendered by post processing. COMPARISON:  CT February 14, 2022. FINDINGS: Lower chest: Unremarkable on MRI to the extent evaluated. Hepatobiliary: Liver with smooth contours. No visible lesion on noncontrast imaging. No pericholecystic fluid or stranding. No biliary duct dilation. Pancreas: Pancreatic duct with minimal irregularity and without substantial dilation, more patulous in the head of the pancreas measuring between 4 and 5 mm. Small cystic lesion in the head of the pancreas measuring 4 mm. The pancreas shows no signs of adjacent inflammation. The duct was clearly dilated beyond this caliber on the recent CT, significance is uncertain. Spleen:  No signs of splenic abnormality. Adrenals/Urinary Tract: Native kidneys are markedly atrophic as on the previous exam. Adrenal glands are normal. Marked enlargement of the transplanted kidney with surrounding edema and a rim of increased T2 signal surrounding the entire kidney compatible with fluid and or edema. The appearance is not substantially changed compared to  the very recent CT accounting for differences in technique. There is no visible hydronephrosis. Renal transplant vessels are not evaluated. Degree of edema in the retroperitoneum may be slightly increased since March of 2023 but show similar distribution. The renal transplant is not fully evaluated on all sequences. There is a rim of fluid signal as stated above in this shows a multi cystic or loculated appearance and based on  comparison with CT imaging is unchanged. Stomach/Bowel: Visualized bowel is unremarkable to the extent evaluated on this abdominal MRI. Vascular/Lymphatic:  No adenopathy or vascular dilation. Other: Edema in the retroperitoneum and about the RIGHT flank as described, incompletely assessed currently. Musculoskeletal: No suspicious bone lesions identified. IMPRESSION: 1. Marked enlargement of the transplanted kidney with surrounding edema and or fluid. The appearance is not substantially changed compared to the very recent CT accounting for differences in technique. There is a rim of fluid signal surrounding this transplanted kidney which is enlarged. Differential considerations include chronically infected RIGHT iliac fossa transplant kidney versus rare complication of renal transplant related lymphangiectasia. Ultimately, assessment by the Renal Transplant Team may be helpful to determine next steps in management. Findings are little changed as far back as March 2023 and actually appear improved when compared to April of 2019. 2. Unusual trauma near complete resolution of visible pancreatic ductal dilation that was seen on the prior study. Transient ductal obstruction is considered but there is no imaging sign of pancreatic inflammation. A small 5 mm lesion is present in the head of the pancreas for which follow-up in 1 year with MRI/MRCP is suggested. There is a change in abdominal symptoms could consider lipase correlation with imaging earlier as warranted for further evaluation. Electronically Signed   By: Zetta Bills M.D.   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 EXAM: MRI ABDOMEN WITHOUT CONTRAST  (INCLUDING MRCP) TECHNIQUE: Multiplanar multisequence MR imaging of the abdomen was performed. Heavily T2-weighted images of the biliary and pancreatic ducts were obtained, and  three-dimensional MRCP images were rendered by post processing. COMPARISON:  CT February 14, 2022. FINDINGS: Lower chest: Unremarkable on MRI to the extent evaluated. Hepatobiliary: Liver with smooth contours. No visible lesion on noncontrast imaging. No pericholecystic fluid or stranding. No biliary duct dilation. Pancreas: Pancreatic duct with minimal irregularity and without substantial dilation, more patulous in the head of the pancreas measuring between 4 and 5 mm. Small cystic lesion in the head of the pancreas measuring 4 mm. The pancreas shows no signs of adjacent inflammation. The duct was clearly dilated beyond this caliber on the recent CT, significance is uncertain. Spleen:  No signs of splenic abnormality. Adrenals/Urinary Tract: Native kidneys are markedly atrophic as on the previous exam. Adrenal glands are normal. Marked enlargement of the transplanted kidney with surrounding edema and a rim of increased T2 signal surrounding the entire kidney compatible with fluid and or edema. The appearance is not substantially changed compared to the very recent CT accounting for differences in technique. There is no visible hydronephrosis. Renal transplant vessels are not evaluated. Degree of edema in the retroperitoneum may be slightly increased since March of 2023 but show similar distribution. The renal transplant is not fully evaluated on all sequences. There is a rim of fluid signal as stated above in this shows a multi cystic or loculated appearance and based on comparison with CT imaging is unchanged. Stomach/Bowel: Visualized  bowel is unremarkable to the extent evaluated on this abdominal MRI. Vascular/Lymphatic:  No adenopathy or vascular dilation. Other: Edema in the retroperitoneum and about the RIGHT flank as described, incompletely assessed currently. Musculoskeletal: No suspicious bone lesions identified. IMPRESSION: 1. Marked enlargement of the transplanted kidney with surrounding edema and or fluid.  The appearance is not substantially changed compared to the very recent CT accounting for differences in technique. There is a rim of fluid signal surrounding this transplanted kidney which is enlarged. Differential considerations include chronically infected RIGHT iliac fossa transplant kidney versus rare complication of renal transplant related lymphangiectasia. Ultimately, assessment by the Renal Transplant Team may be helpful to determine next steps in management. Findings are little changed as far back as March 2023 and actually appear improved when compared to April of 2019. 2. Unusual trauma near complete resolution of visible pancreatic ductal dilation that was seen on the prior study. Transient ductal obstruction is considered but there is no imaging sign of pancreatic inflammation. A small 5 mm lesion is present in the head of the pancreas for which follow-up in 1 year with MRI/MRCP is suggested. There is a change in abdominal symptoms could consider lipase correlation with imaging earlier as warranted for further evaluation. Electronically Signed   By: Zetta Bills M.D.   On: 02/15/2022 12:21   CT Renal Stone Study  Result Date: 02/14/2022 CLINICAL DATA:  Flank pain, kidney stone suspected. Right back pain, right flank pain. EXAM: CT ABDOMEN AND PELVIS WITHOUT CONTRAST TECHNIQUE: Multidetector CT imaging of the abdomen and pelvis was performed following the standard protocol without IV contrast. RADIATION DOSE REDUCTION: This exam was performed according to the departmental dose-optimization program which includes automated exposure control, adjustment of the mA and/or kV according to patient size and/or use of iterative reconstruction technique. COMPARISON:  11/19/2021 FINDINGS: Lower chest: No acute abnormality. Hepatobiliary: No focal liver abnormality is seen. No gallstones, gallbladder wall thickening, or biliary dilatation. Pancreas: There is marked dilation of the pancreatic duct within the  head of the pancreas which measures up to 12 mm in diameter, new since prior examination. The duct within the body and tail the pancreas appears decompressed. This is not well characterized on this noncontrast examination. No peripancreatic inflammatory changes or fluid collections are identified. No intraparenchymal calcifications are seen. The pancreatic parenchyma is preserved. Spleen: Unremarkable Adrenals/Urinary Tract: The adrenal glands are unremarkable. The native kidneys are markedly atrophic in keeping with end-stage renal disease. A densely calcified failed transplant kidney is seen within the left iliac fossa. Within the right iliac fossa is a transplant kidney which demonstrates marked enlargement and cortical thickening, diffuse cortical hypoattenuation, and extensive perinephric inflammatory stranding. These findings can be seen the setting of acute pyelonephritis or graft rejection. The findings appear similar to prior examination. There is no hydronephrosis. No discrete drainable perinephric fluid collections are identified. No intrarenal or ureteral calculi are identified. A 7.8 cm probable simple cortical cyst is again identified within the transplant kidney. The bladder is unremarkable. Stomach/Bowel: Mild sigmoid diverticulosis. The stomach, small bowel, and large bowel are otherwise unremarkable. Appendix normal. No free intraperitoneal gas or fluid. Vascular/Lymphatic: Aortic atherosclerosis. No enlarged abdominal or pelvic lymph nodes. Reproductive: Uterus and bilateral adnexa are unremarkable. Other: Asymmetric subcutaneous edema within the right lower quadrant abdominal wall superficial to the transplant kidney is again seen. Infiltrative changes within the periumbilical region may be post surgical in nature. These appear unchanged. No abdominal wall hernia. No ascites. Musculoskeletal: No acute or significant  osseous findings. IMPRESSION: 1. Interval development of marked dilation of the  pancreatic duct within the head of the pancreas measuring up to 12 mm in diameter. This is not well characterized on this noncontrast examination. No peripancreatic inflammatory changes or fluid collections are identified. Differential considerations include a main duct IPMN, postinflammatory stricture, or possibly a pancreatic neoplasm. This could be further assessed with contrast enhanced MRI examination or endoscopic ultrasound examination. 2. Stable appearance of the right iliac fossa transplant kidney demonstrating marked enlargement, cortical thickening, and extensive perinephric inflammatory stranding. These findings can be seen the setting of acute pyelonephritis or graft rejection. Correlation with urinalysis and urine culture may be helpful. No hydronephrosis or discrete drainable perinephric fluid collections identified. 3. Mild sigmoid diverticulosis. 4. Asymmetric subcutaneous edema within the right lower quadrant abdominal wall superficial to the transplant kidney is again seen. Infiltrative changes within the periumbilical region may be post surgical in nature. These appear unchanged. No abdominal wall hernia. Aortic Atherosclerosis (ICD10-I70.0). Electronically Signed   By: Fidela Salisbury M.D.   On: 02/14/2022 18:08   US Abdomen Limited RUQ (LIVER/GB)  Result Date: 02/14/2022 CLINICAL DATA:  Right upper quadrant pain for 2 days EXAM: ULTRASOUND ABDOMEN LIMITED RIGHT UPPER QUADRANT COMPARISON:  None Available. FINDINGS: Gallbladder: Mildly contracted gallbladder. No gallstones or wall thickening visualized. No sonographic Murphy sign noted by sonographer. Common bile duct: Diameter: 0.3 cm Liver: No focal lesion identified. Somewhat coarse contour of the liver. Increased parenchymal echogenicity. Portal vein is patent on color Doppler imaging with normal direction of blood flow towards the liver. Other: None. IMPRESSION: 1. No evidence of cholelithiasis or acute cholecystitis. 2. Somewhat coarse  contour of the liver with increased parenchymal echogenicity. Findings suggest cirrhosis. Electronically Signed   By: Delanna Ahmadi M.D.   On: 02/14/2022 17:55   DG Chest 2 View  Result Date: 02/14/2022 CLINICAL DATA:  Shortness of breath. EXAM: CHEST - 2 VIEW COMPARISON:  October 29, 2021. FINDINGS: Mild streaky bibasilar opacities. No confluent consolidation no visible pleural effusions or pneumothorax. Similar cardiomediastinal silhouette. No displaced fracture. IMPRESSION: Mild streaky bibasilar opacities, favor atelectasis. No confluent consolidation. Electronically Signed   By: Margaretha Sheffield M.D.   On: 02/14/2022 15:12    Assessment / Plan:  58) 46 year old female with a history of GERD, pancreatitis 10/202 and chronic upper/lower abdominal pain admitted to the hospital with nausea, dry heaves with worsening back/flank and generalized abdominal pain. CTAP without contrast showed marked dilatation of the pancreatic duct within the head of the pancreas measuring up to 12 mm in diameter which was not well characterized on noncontrast study. Possible main duct IPMN, postinflammatory stricture or possibly a pancreatic neoplasm. Normal LFTs. Lipase 90 -> 41. Abdominal MRI/MRCP without contrast 6/15 showed near complete resolution of the visible pancreatic ductal dilatation that was seen on the prior study, transient ductal obstruction is considered but there was no imaging sign of pancreatic inflammation and a small 5 mm lesion was present in the head of the pancreas. No abdominal pain today.  -Consider outpatient EUS, to discuss further with our biliary team  -IV fluids per the hospitalist/nephrology -Pantoprazole 40 mg IV q. 24 -Ondansetron 4 mg p.o. or IV every 6 hours. -Pain management per the hospitalist -Await further recommendations per Dr. Tarri Glenn   2) S/P kidney transplant x 2, failed transplant in 1995, second transplant 2008 with worsening renal function. Being evaluated for a 3rd  kidney transplant at [place]. On multiple immunosuppressants. Abdominal MRI  showed marked enlargement of the transplanted kidney with surrounding edema. -Follow-up with nephrologist Dr. Hollie Salk and renal transplant team   3) IBS, alternating diarrhea and constipation -Eventual colonoscopy as an outpatient -MiraLAX nightly.   4) Chronic anemia, secondary to renal disease.   5) Rectal bleeding x 1 on 6/14. History of anal lesion (AIN) s/p excision x 2, followed by colorectal surgeon in [place], [place]. -Continue to monitor the patient for GI bleeding -Follow up with colorectal surgeon as outpatient    6) Cirrhosis per abdominal sono. Normal platelet count. Normal LFTs.  -Cirrhosis work-up as an outpatient  7) Chronic lower back pain     Principal Problem:   Abdominal pain Active Problems:   Mixed hyperlipidemia   Anxiety disorder   Failed kidney transplant   Chronic kidney disease, stage V (HCC)   Essential hypertension   Gout   Secondary hyperparathyroidism of renal origin ([place])   GERD without esophagitis   Pancreatic duct dilated   Abnormal CT scan, gastrointestinal tract     LOS: 2 days   Noralyn Pick  02/16/2022, 11:19 AM

## 2022-02-16 NOTE — Care Management Important Message (Signed)
Important Message  Patient Details IM Letter given to the Patient. Name: Ariel [name] MRN: [id number] Date of Birth: [birth year]   Medicare Important Message Given:  Yes     Kerin Salen 02/16/2022, 11:53 AM

## 2022-02-16 NOTE — Progress Notes (Signed)
[facility] Progress Note  Subjective: no c/o  Vitals:   02/15/22 2011 02/15/22 2352 02/16/22 0521 02/16/22 1430  BP: (!) 166/106 (!) 160/110 (!) 158/103 118/87  Pulse: 69 67 70 72  Resp: '18 18 19 17  '$ Temp: 98.1 F (36.7 C) 98.2 F (36.8 C) 98.2 F (36.8 C) 98.8 F (37.1 C)  TempSrc: Oral Oral Oral Oral  SpO2: 99% 100% 98% 98%  Weight:      Height:        Exam: Gen alert, no distress No rash, cyanosis or gangrene Sclera anicteric, throat clear  No jvd or bruits Chest clear bilat to bases, no rales/ wheezing RRR no MRG Abd soft ntnd no mass or ascites +bs GU defer MS no joint effusions or deformity Ext no LE or UE edema, no wounds or ulcers Neuro is alert, Ox 3 , nf    No active AVF./ AVG          Home meds: allopurinol, alprazolam, carvedilol 25 bid, pepcid, furosemide 40 qd prn, isosorbide mononitrate, medroxyprogesterone, mycophenolate 360 bid, klor con 20, prednisone 5, sertraline, sevelamer 800 tid, simvastatin, sod bicarb 1300 hs, prograf 1.5 bid, traozodone            Assessment/ Plan: CKD 5 renal transplant - this transplant was done in 2008, failing slowly, she is f/b Dr Hollie Salk at [facility]. She has upcoming [facility] appt to get a new HD access. She is taking all her transplant medications properly and at the right dosing. Does not looks vol overloaded or dehydrated. Creat is close to current caseline at 4.0 here. No signs of uremia. No new suggestions. Would hold the prn lasix for now w/ likely UTI/ infection. No other suggestions. Please call w/ any questions. Will sign off.  Enlarged/ edematous transplant kidney - by imaging. Looking back this transplant kidney has been large / swollen for many mos and years. Does not look any different on CT this admission and transplant is not tender, do not think this is acute rejection. She could have kidney infection but wouldn't change Rx as kidney is not obstructed.  Abd pain - suspected pyelonephritis, on IV abx,  cx's pending. Not toxic in appearance. HTN - getting home coreg, imdur here           Rob Keano [place] 02/16/2022, 3:07 PM   Recent Labs  Lab 02/14/22 1539 02/15/22 0330 02/15/22 0443 02/15/22 1354 02/16/22 0336  HGB 10.5*  --  9.9*  --   --   ALBUMIN 3.7 3.5  --   --   --   CALCIUM 8.1* 7.7*  --   --  8.0*  PHOS  --  3.8  --   --   --   CREATININE 3.90* 4.05*  --   --  3.71*  K 4.4 5.3*  --  5.3* 4.6   Inpatient medications:  allopurinol  100 mg Oral Daily   ALPRAZolam  0.5 mg Oral QHS   carvedilol  25 mg Oral BID WC   famotidine  20 mg Oral Daily   isosorbide mononitrate  30 mg Oral q morning   mycophenolate  360 mg Oral BID   predniSONE  5 mg Oral Daily   sertraline  50 mg Oral Daily   sevelamer carbonate  800 mg Oral TID WC   simvastatin  5 mg Oral QHS   sodium bicarbonate  1,300 mg Oral QPM   tacrolimus  1.5 mg Oral BID   traZODone  100 mg Oral  QHS    piperacillin-tazobactam 2.25 g (02/16/22 1248)   albuterol, hydrALAZINE, hydrOXYzine, ondansetron **OR** ondansetron (ZOFRAN) IV, polyethylene glycol

## 2022-02-16 NOTE — Progress Notes (Signed)
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  Patient is following up with Dr. Hollie Salk as well as [facility] seeking a third kidney transplant.  Patient presented in the ED with multiple complaints (abdominal pain, right flank pain with dysuria). CT renal protocol reveals mild dilatation of pancreatic duct within the head of pancreas measuring up to 12 mm in diameter concerning for possible postinflammatory stricture or neoplasm.   Patient was admitted for abdominal pain.GI was consulted recommended MRCP. Nephrology was also consulted.  MRCP showed complete resolution of visible pancreatic duct dilatation.  GI recommended outpatient EUS.  Assessment & Plan:   Principal Problem:   Abdominal pain Active Problems:   Pancreatic duct dilated   Chronic kidney disease, stage V (HCC)   Secondary hyperparathyroidism of renal origin ([place])   Failed kidney transplant   Essential hypertension   Mixed hyperlipidemia   Anxiety disorder   GERD without esophagitis   Gout   Abnormal CT scan, gastrointestinal tract  Abdominal pain: Patient with known history of failed renal transplant x 2, now essentially CKD stage V found to have inflamed and edematous transplanted kidney with multiple bouts of pyelonephritis in the past. Patient found to have leukocytosis, Elevated procalcitonin, UA equivocal concerning for pyelonephritis. Given immunocompromised state  Patient initiated on IV Zosyn.  Continue IV Zosyn for now. Patient  also found to have 12 mm dilated pancreatic duct on CT A/P could be the reason for epigastric pain, mildly elevated lipase. GI was consulted,  recommended MRCP which shows complete resolution of visible pancreatic duct dilatation.   Follow-up urine culture, and discontinue antibiotics if able to. GI recommended EUS as an outpatient, continue pantoprazole, antiemetics.  CKD stage V: Patient has extremely advanced chronic kidney disease due to longstanding graft rejection.   She is in process to be considered for third renal transplant. Nephrology is consulted.  Patient has appointment with vascular surgery for HD Access. Serum creatinine at baseline 4.0>3.71 .  No signs of uremia, does not appear volume overloaded or dehydrated. Does not appear toxic.  Hold Lasix for now.  Essential hypertension: Continue Coreg 25 mg BID, Imdur 30 mg daily Hydralazine 10 mg IV as needed.  Hyperlipidemia: Continue simvastatin 5 mg daily  Anxiety disorder: Continue alprazolam 0.5 mg at bedtime. Continue hydroxyzine 25 to 50 mg twice daily as needed Continue Zoloft 50 mg daily  GERD: Stable.  Continue pantoprazole 40 mg daily  Gout: Continue allopurinol 100 mg daily  S/p kidney transplant: She follows with Dr. Hollie Salk.  Continue prednisone, tacrolimus, Myfortic  Normochromic normocytic anemia: H&H is stable.  likely secondary to chronic kidney disease   DVT prophylaxis: Heparin Code Status: Full code. Family Communication: No family at bed side. Disposition Plan:   Status is: Inpatient Remains inpatient appropriate because:  Admitted for abdominal pain concerning for pyelonephritis, also found to have dilated pancreatic duct concerning for choledocholithiasis.  MRCP does not show pancreatic duct dilatation.  GI recommended EUS outpatient.  Anticipated discharge home in 1 to 2 days.   Consultants:  Gastroenterology Nephrology  Procedures: MRCP  antimicrobials:  Zosyn  Subjective: Patient was seen and examined at bedside.  Overnight events noted. Patient reports feeling much improved.  Denies any abdominal pain,  nausea or vomiting. MRCP does not show any dilatation of pancreatic duct.  She denies any urinary burning.  Objective: Vitals:   02/15/22 1310 02/15/22 2011 02/15/22 2352 02/16/22 0521  BP: (!) 158/78 (!) 166/106 (!) 160/110 (!) 158/103  Pulse: 71 69 67 70  Resp: '18 18 18 19  '$ Temp: 98.2 F (36.8 C) 98.1 F (36.7 C) 98.2 F (36.8 C) 98.2 F (36.8 C)  TempSrc:  Oral Oral Oral  SpO2: 99% 99% 100% 98%  Weight:      Height:        Intake/Output Summary (Last 24 hours) at 02/16/2022 1237 Last data filed at 02/16/2022 1100 Gross per 24 hour  Intake 802 ml  Output 1000 ml  Net -198 ml   Filed Weights   02/14/22 1452  Weight: 75.8 kg    Examination:  General exam: Appears comfortable, not in any acute distress.  Deconditioned Respiratory system: CTA bilaterally, no wheezing, no crackles, normal respiratory effort. Cardiovascular system: S1-S2 heard, regular rate and rhythm, no murmur. Gastrointestinal system: Abdomen is soft, nontender, nondistended, BS+ Central nervous system: Alert and oriented x 3 . No focal neurological deficits. Extremities: No edema, no cyanosis, no clubbing. Skin: No rashes, lesions or ulcers Psychiatry: Judgement and insight appear normal. Mood & affect appropriate.     Data Reviewed: I have personally reviewed following labs and imaging studies  CBC: Recent Labs  Lab 02/14/22 1539 02/15/22 0443  WBC 14.7* 11.1*  NEUTROABS  --  8.8*  HGB 10.5* 9.9*  HCT 33.4* 31.4*  MCV 94.1 93.2  PLT 198 361   Basic Metabolic Panel: Recent Labs  Lab 02/14/22 1539 02/15/22 0330 02/15/22 1354 02/16/22 0336  NA 139 139  --  139  K 4.4 5.3* 5.3* 4.6  CL 112* 111  --  107  CO2 19* 21*  --  21*  GLUCOSE 106* 98  --  87  BUN 40* 43*  --  44*  CREATININE 3.90* 4.05*  --  3.71*  CALCIUM 8.1* 7.7*  --   8.0*  MG  --  1.4*  --   --   PHOS  --  3.8  --   --    GFR: Estimated Creatinine Clearance: 17.2 mL/min (A) (by C-G formula based on SCr of 3.71 mg/dL (H)). Liver Function Tests: Recent Labs  Lab 02/14/22 1539 02/15/22 0330  AST 17 14*  ALT 10 10  ALKPHOS 43 42  BILITOT 0.5 0.6  PROT 6.3* 6.2*  ALBUMIN 3.7 3.5   Recent Labs  Lab 02/15/22 0330 02/16/22 0336  LIPASE 90* 41   No results for input(s): "AMMONIA" in the last 168 hours. Coagulation Profile: No results for input(s): "INR", "PROTIME" in the last 168 hours. Cardiac Enzymes: No results for input(s): "CKTOTAL", "CKMB", "CKMBINDEX", "TROPONINI" in the last 168 hours. BNP (last 3 results) No results for input(s): "PROBNP" in the last 8760 hours. HbA1C: No results for input(s): "HGBA1C" in the last 72 hours. CBG: Recent Labs  Lab 02/14/22 1826  GLUCAP 109*   Lipid Profile: No results for input(s): "CHOL", "HDL", "LDLCALC", "TRIG", "CHOLHDL", "LDLDIRECT" in the last 72 hours. Thyroid Function Tests: No results for input(s): "TSH", "T4TOTAL", "FREET4", "T3FREE", "THYROIDAB" in the last 72 hours. Anemia Panel: No results for input(s): "VITAMINB12", "FOLATE", "FERRITIN", "TIBC", "IRON", "RETICCTPCT" in the last 72 hours. Sepsis  Labs: Recent Labs  Lab 02/14/22 2132 02/15/22 0325 02/15/22 0330  PROCALCITON  --   --  0.33  LATICACIDVEN 1.3 1.3  --     Recent Results (from the past 240 hour(s))  Urine Culture     Status: Abnormal   Collection Time: 02/14/22  6:24 PM   Specimen: Urine, Clean Catch  Result Value Ref Range Status   Specimen Description   Final    URINE, CLEAN CATCH Performed at [facility], [place] [address], [place], [place] [address]    Special Requests   Final    NONE Performed at [facility], [place] [address] [place], [place] [address]    Culture 20,000 COLONIES/mL PROTEUS MIRABILIS (A)  Final   Report Status 02/16/2022 FINAL  Final   Organism ID,  Bacteria PROTEUS MIRABILIS (A)  Final      Susceptibility   Proteus mirabilis - MIC*    AMPICILLIN <=2 SENSITIVE Sensitive     CEFAZOLIN <=4 SENSITIVE Sensitive     CEFEPIME <=0.12 SENSITIVE Sensitive     CEFTRIAXONE <=0.25 SENSITIVE Sensitive     CIPROFLOXACIN <=0.25 SENSITIVE Sensitive     GENTAMICIN <=1 SENSITIVE Sensitive     IMIPENEM 2 SENSITIVE Sensitive     NITROFURANTOIN 128 RESISTANT Resistant     TRIMETH/SULFA <=20 SENSITIVE Sensitive     AMPICILLIN/SULBACTAM <=2 SENSITIVE Sensitive     PIP/TAZO <=4 SENSITIVE Sensitive     * 20,000 COLONIES/mL PROTEUS MIRABILIS  Culture, blood (Routine X 2) w Reflex to ID Panel     Status: None (Preliminary result)   Collection Time: 02/14/22 10:00 PM   Specimen: BLOOD  Result Value Ref Range Status   Specimen Description   Final    BLOOD BLOOD RIGHT HAND Performed at [facility] [address] [place], [place] [address]    Special Requests   Final    BOTTLES DRAWN AEROBIC AND ANAEROBIC Blood Culture results may not be optimal due to an inadequate volume of blood received in culture bottles Performed at [facility] [address], [place], [place] [address]    Culture   Final    NO GROWTH 2 DAYS Performed at [facility] [address] [place], [place] [address]    Report Status PENDING  Incomplete  Culture, blood (Routine X 2) w Reflex to ID Panel     Status: None (Preliminary result)   Collection Time: 02/14/22 10:16 PM   Specimen: BLOOD  Result Value Ref Range Status   Specimen Description   Final    BLOOD BLOOD RIGHT WRIST Performed at [facility] [address], [place], [place] [address]    Special Requests   Final    BOTTLES DRAWN AEROBIC AND ANAEROBIC Blood Culture adequate volume Performed at [facility] [address], [place], [place] [address]    Culture   Final    NO GROWTH 2 DAYS Performed at [facility] [address] [place], [place] [address]    Report Status PENDING  Incomplete         Radiology Studies: MR ABDOMEN MRCP WO CONTRAST  Result Date: 02/15/2022 CLINICAL DATA:  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 EXAM: MRI ABDOMEN WITHOUT CONTRAST  (INCLUDING MRCP) TECHNIQUE: Multiplanar multisequence MR imaging of the abdomen was performed. Heavily T2-weighted images of the biliary and pancreatic ducts were obtained, and three-dimensional MRCP images were  rendered by post processing. COMPARISON:  CT February 14, 2022. FINDINGS: Lower chest: Unremarkable on MRI to the extent evaluated. Hepatobiliary: Liver with smooth contours. No visible lesion on noncontrast imaging. No pericholecystic fluid or stranding. No biliary duct dilation. Pancreas: Pancreatic duct with minimal irregularity and without substantial dilation, more patulous in the head of the pancreas measuring between 4 and 5 mm. Small cystic lesion in the head of the pancreas measuring 4 mm. The pancreas shows no signs of adjacent inflammation. The duct was clearly dilated beyond this caliber on the recent CT, significance is uncertain. Spleen:  No signs of splenic abnormality. Adrenals/Urinary Tract: Native kidneys are markedly atrophic as on the previous exam. Adrenal glands are normal. Marked enlargement of the transplanted kidney with surrounding edema and a rim of increased T2 signal surrounding the entire kidney compatible with fluid and or edema. The appearance is not substantially changed compared to the very recent CT accounting for differences in technique. There is no visible hydronephrosis. Renal transplant vessels are not evaluated. Degree of edema in the retroperitoneum may be slightly increased since March of 2023 but show similar distribution. The renal transplant is not fully evaluated on all sequences. There is a rim of fluid signal as stated above in this shows a multi cystic or loculated  appearance and based on comparison with CT imaging is unchanged. Stomach/Bowel: Visualized bowel is unremarkable to the extent evaluated on this abdominal MRI. Vascular/Lymphatic:  No adenopathy or vascular dilation. Other: Edema in the retroperitoneum and about the RIGHT flank as described, incompletely assessed currently. Musculoskeletal: No suspicious bone lesions identified. IMPRESSION: 1. Marked enlargement of the transplanted kidney with surrounding edema and or fluid. The appearance is not substantially changed compared to the very recent CT accounting for differences in technique. There is a rim of fluid signal surrounding this transplanted kidney which is enlarged. Differential considerations include chronically infected RIGHT iliac fossa transplant kidney versus rare complication of renal transplant related lymphangiectasia. Ultimately, assessment by the Renal Transplant Team may be helpful to determine next steps in management. Findings are little changed as far back as March 2023 and actually appear improved when compared to April of 2019. 2. Unusual trauma near complete resolution of visible pancreatic ductal dilation that was seen on the prior study. Transient ductal obstruction is considered but there is no imaging sign of pancreatic inflammation. A small 5 mm lesion is present in the head of the pancreas for which follow-up in 1 year with MRI/MRCP is suggested. There is a change in abdominal symptoms could consider lipase correlation with imaging earlier as warranted for further evaluation. Electronically Signed   By: Zetta Bills M.D.   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 EXAM: MRI ABDOMEN WITHOUT CONTRAST  (INCLUDING MRCP) TECHNIQUE: Multiplanar multisequence MR imaging of the abdomen was performed. Heavily T2-weighted images of the biliary and pancreatic  ducts were obtained, and three-dimensional MRCP images were rendered by post processing. COMPARISON:  CT February 14, 2022. FINDINGS: Lower chest: Unremarkable on MRI to the extent evaluated. Hepatobiliary: Liver with smooth contours. No visible lesion on noncontrast imaging. No pericholecystic fluid or stranding. No biliary duct dilation. Pancreas: Pancreatic duct with minimal irregularity and without substantial dilation, more patulous in the head of the pancreas measuring between 4 and 5 mm. Small cystic lesion in the head of the pancreas measuring 4 mm. The pancreas  shows no signs of adjacent inflammation. The duct was clearly dilated beyond this caliber on the recent CT, significance is uncertain. Spleen:  No signs of splenic abnormality. Adrenals/Urinary Tract: Native kidneys are markedly atrophic as on the previous exam. Adrenal glands are normal. Marked enlargement of the transplanted kidney with surrounding edema and a rim of increased T2 signal surrounding the entire kidney compatible with fluid and or edema. The appearance is not substantially changed compared to the very recent CT accounting for differences in technique. There is no visible hydronephrosis. Renal transplant vessels are not evaluated. Degree of edema in the retroperitoneum may be slightly increased since March of 2023 but show similar distribution. The renal transplant is not fully evaluated on all sequences. There is a rim of fluid signal as stated above in this shows a multi cystic or loculated appearance and based on comparison with CT imaging is unchanged. Stomach/Bowel: Visualized bowel is unremarkable to the extent evaluated on this abdominal MRI. Vascular/Lymphatic:  No adenopathy or vascular dilation. Other: Edema in the retroperitoneum and about the RIGHT flank as described, incompletely assessed currently. Musculoskeletal: No suspicious bone lesions identified. IMPRESSION: 1. Marked enlargement of the transplanted kidney with  surrounding edema and or fluid. The appearance is not substantially changed compared to the very recent CT accounting for differences in technique. There is a rim of fluid signal surrounding this transplanted kidney which is enlarged. Differential considerations include chronically infected RIGHT iliac fossa transplant kidney versus rare complication of renal transplant related lymphangiectasia. Ultimately, assessment by the Renal Transplant Team may be helpful to determine next steps in management. Findings are little changed as far back as March 2023 and actually appear improved when compared to April of 2019. 2. Unusual trauma near complete resolution of visible pancreatic ductal dilation that was seen on the prior study. Transient ductal obstruction is considered but there is no imaging sign of pancreatic inflammation. A small 5 mm lesion is present in the head of the pancreas for which follow-up in 1 year with MRI/MRCP is suggested. There is a change in abdominal symptoms could consider lipase correlation with imaging earlier as warranted for further evaluation. Electronically Signed   By: Zetta Bills M.D.   On: 02/15/2022 12:21   CT Renal Stone Study  Result Date: 02/14/2022 CLINICAL DATA:  Flank pain, kidney stone suspected. Right back pain, right flank pain. EXAM: CT ABDOMEN AND PELVIS WITHOUT CONTRAST TECHNIQUE: Multidetector CT imaging of the abdomen and pelvis was performed following the standard protocol without IV contrast. RADIATION DOSE REDUCTION: This exam was performed according to the departmental dose-optimization program which includes automated exposure control, adjustment of the mA and/or kV according to patient size and/or use of iterative reconstruction technique. COMPARISON:  11/19/2021 FINDINGS: Lower chest: No acute abnormality. Hepatobiliary: No focal liver abnormality is seen. No gallstones, gallbladder wall thickening, or biliary dilatation. Pancreas: There is marked dilation of  the pancreatic duct within the head of the pancreas which measures up to 12 mm in diameter, new since prior examination. The duct within the body and tail the pancreas appears decompressed. This is not well characterized on this noncontrast examination. No peripancreatic inflammatory changes or fluid collections are identified. No intraparenchymal calcifications are seen. The pancreatic parenchyma is preserved. Spleen: Unremarkable Adrenals/Urinary Tract: The adrenal glands are unremarkable. The native kidneys are markedly atrophic in keeping with end-stage renal disease. A densely calcified failed transplant kidney is seen within the left iliac fossa. Within the right iliac fossa is a transplant kidney  which demonstrates marked enlargement and cortical thickening, diffuse cortical hypoattenuation, and extensive perinephric inflammatory stranding. These findings can be seen the setting of acute pyelonephritis or graft rejection. The findings appear similar to prior examination. There is no hydronephrosis. No discrete drainable perinephric fluid collections are identified. No intrarenal or ureteral calculi are identified. A 7.8 cm probable simple cortical cyst is again identified within the transplant kidney. The bladder is unremarkable. Stomach/Bowel: Mild sigmoid diverticulosis. The stomach, small bowel, and large bowel are otherwise unremarkable. Appendix normal. No free intraperitoneal gas or fluid. Vascular/Lymphatic: Aortic atherosclerosis. No enlarged abdominal or pelvic lymph nodes. Reproductive: Uterus and bilateral adnexa are unremarkable. Other: Asymmetric subcutaneous edema within the right lower quadrant abdominal wall superficial to the transplant kidney is again seen. Infiltrative changes within the periumbilical region may be post surgical in nature. These appear unchanged. No abdominal wall hernia. No ascites. Musculoskeletal: No acute or significant osseous findings. IMPRESSION: 1. Interval  development of marked dilation of the pancreatic duct within the head of the pancreas measuring up to 12 mm in diameter. This is not well characterized on this noncontrast examination. No peripancreatic inflammatory changes or fluid collections are identified. Differential considerations include a main duct IPMN, postinflammatory stricture, or possibly a pancreatic neoplasm. This could be further assessed with contrast enhanced MRI examination or endoscopic ultrasound examination. 2. Stable appearance of the right iliac fossa transplant kidney demonstrating marked enlargement, cortical thickening, and extensive perinephric inflammatory stranding. These findings can be seen the setting of acute pyelonephritis or graft rejection. Correlation with urinalysis and urine culture may be helpful. No hydronephrosis or discrete drainable perinephric fluid collections identified. 3. Mild sigmoid diverticulosis. 4. Asymmetric subcutaneous edema within the right lower quadrant abdominal wall superficial to the transplant kidney is again seen. Infiltrative changes within the periumbilical region may be post surgical in nature. These appear unchanged. No abdominal wall hernia. Aortic Atherosclerosis (ICD10-I70.0). Electronically Signed   By: Fidela Salisbury M.D.   On: 02/14/2022 18:08   US Abdomen Limited RUQ (LIVER/GB)  Result Date: 02/14/2022 CLINICAL DATA:  Right upper quadrant pain for 2 days EXAM: ULTRASOUND ABDOMEN LIMITED RIGHT UPPER QUADRANT COMPARISON:  None Available. FINDINGS: Gallbladder: Mildly contracted gallbladder. No gallstones or wall thickening visualized. No sonographic Murphy sign noted by sonographer. Common bile duct: Diameter: 0.3 cm Liver: No focal lesion identified. Somewhat coarse contour of the liver. Increased parenchymal echogenicity. Portal vein is patent on color Doppler imaging with normal direction of blood flow towards the liver. Other: None. IMPRESSION: 1. No evidence of cholelithiasis or  acute cholecystitis. 2. Somewhat coarse contour of the liver with increased parenchymal echogenicity. Findings suggest cirrhosis. Electronically Signed   By: Delanna Ahmadi M.D.   On: 02/14/2022 17:55   DG Chest 2 View  Result Date: 02/14/2022 CLINICAL DATA:  Shortness of breath. EXAM: CHEST - 2 VIEW COMPARISON:  October 29, 2021. FINDINGS: Mild streaky bibasilar opacities. No confluent consolidation no visible pleural effusions or pneumothorax. Similar cardiomediastinal silhouette. No displaced fracture. IMPRESSION: Mild streaky bibasilar opacities, favor atelectasis. No confluent consolidation. Electronically Signed   By: Margaretha Sheffield M.D.   On: 02/14/2022 15:12    Scheduled Meds:  allopurinol  100 mg Oral Daily   ALPRAZolam  0.5 mg Oral QHS   carvedilol  25 mg Oral BID WC   famotidine  20 mg Oral Daily   isosorbide mononitrate  30 mg Oral q morning   mycophenolate  360 mg Oral BID   predniSONE  5 mg Oral Daily  sertraline  50 mg Oral Daily   sevelamer carbonate  800 mg Oral TID WC   simvastatin  5 mg Oral QHS   sodium bicarbonate  1,300 mg Oral QPM   tacrolimus  1.5 mg Oral BID   traZODone  100 mg Oral QHS   Continuous Infusions:  piperacillin-tazobactam Stopped (02/16/22 0623)     LOS: 2 days    Time spent: 50 mins    Ashford Clouse, MD [facility]   If 7PM-7AM, please contact night-coverage

## 2022-02-17 DIAGNOSIS — R933 Abnormal findings on diagnostic imaging of other parts of digestive tract: Secondary | ICD-10-CM | POA: Diagnosis not present

## 2022-02-17 DIAGNOSIS — I10 Essential (primary) hypertension: Secondary | ICD-10-CM | POA: Diagnosis not present

## 2022-02-17 DIAGNOSIS — N185 Chronic kidney disease, stage 5: Secondary | ICD-10-CM | POA: Diagnosis not present

## 2022-02-17 LAB — CBC
HCT: 31.7 % — ABNORMAL LOW (ref 36.0–46.0)
Hemoglobin: 9.9 g/dL — ABNORMAL LOW (ref 12.0–15.0)
MCH: 29.5 pg (ref 26.0–34.0)
MCHC: 31.2 g/dL (ref 30.0–36.0)
MCV: 94.3 fL (ref 80.0–100.0)
Platelets: 196 10*3/uL (ref 150–400)
RBC: 3.36 MIL/uL — ABNORMAL LOW (ref 3.87–5.11)
RDW: 13.5 % (ref 11.5–15.5)
WBC: 9.4 10*3/uL (ref 4.0–10.5)
nRBC: 0 % (ref 0.0–0.2)

## 2022-02-17 LAB — COMPREHENSIVE METABOLIC PANEL
ALT: 9 U/L (ref 0–44)
AST: 11 U/L — ABNORMAL LOW (ref 15–41)
Albumin: 3.3 g/dL — ABNORMAL LOW (ref 3.5–5.0)
Alkaline Phosphatase: 35 U/L — ABNORMAL LOW (ref 38–126)
Anion gap: 10 (ref 5–15)
BUN: 44 mg/dL — ABNORMAL HIGH (ref 6–20)
CO2: 20 mmol/L — ABNORMAL LOW (ref 22–32)
Calcium: 8.1 mg/dL — ABNORMAL LOW (ref 8.9–10.3)
Chloride: 108 mmol/L (ref 98–111)
Creatinine, Ser: 3.9 mg/dL — ABNORMAL HIGH (ref 0.44–1.00)
GFR, Estimated: 14 mL/min — ABNORMAL LOW (ref >60)
Glucose, Bld: 99 mg/dL (ref 70–99)
Potassium: 3.9 mmol/L (ref 3.5–5.1)
Sodium: 138 mmol/L (ref 135–145)
Total Bilirubin: 0.5 mg/dL (ref 0.3–1.2)
Total Protein: 5.8 g/dL — ABNORMAL LOW (ref 6.5–8.1)

## 2022-02-17 LAB — MAGNESIUM: Magnesium: 1.9 mg/dL (ref 1.7–2.4)

## 2022-02-17 LAB — PHOSPHORUS: Phosphorus: 4.7 mg/dL — ABNORMAL HIGH (ref 2.5–4.6)

## 2022-02-17 MED ORDER — GABAPENTIN 100 MG PO CAPS: 100.0000 mg | ORAL_CAPSULE | Freq: Two times a day (BID) | ORAL | 0 refills | Status: DC

## 2022-02-17 MED ORDER — AMOXICILLIN-POT CLAVULANATE 500-125 MG PO TABS
1.0000 | ORAL_TABLET | Freq: Two times a day (BID) | ORAL | Status: DC
  Administered 2022-02-17: 500 mg via ORAL
  Filled 2022-02-17: qty 1

## 2022-02-17 MED ORDER — AMOXICILLIN-POT CLAVULANATE 500-125 MG PO TABS: 1.0000 | ORAL_TABLET | Freq: Two times a day (BID) | ORAL | 0 refills | Status: AC

## 2022-02-17 NOTE — Hospital Course (Signed)
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  Patient is following up with Dr. Hollie Salk as well as [facility] seeking a third kidney transplant.  Patient presented in the ED with multiple complaints (abdominal pain, right flank pain with dysuria). CT renal protocol reveals mild dilatation of pancreatic duct within the head of pancreas measuring up to 12 mm in diameter concerning for possible postinflammatory stricture or neoplasm.   Patient was admitted for abdominal pain.GI was consulted recommended MRCP. Nephrology was also consulted.  MRCP showed complete resolution of visible pancreatic duct dilatation.  GI recommended outpatient EUS.

## 2022-02-17 NOTE — Progress Notes (Signed)
IV removed. Discharge instructions reviewed. Questions answered. Patient drove her own car and wishes to be escorted to entrance via wheelchair. Gait steady.

## 2022-02-17 NOTE — Discharge Summary (Signed)
Physician Discharge Summary   Patient: Ariel [name] MRN: [id number] DOB: [birth year]  Admit date:     02/14/2022  Discharge date: 02/17/22  Discharge Physician: Marylu Lund   PCP: [name], PA-C   Recommendations at discharge:    Follow up with PCP in 2-3 weeks Follow up with Nephrology as scheduled  Discharge Diagnoses: Principal Problem:   Abdominal pain Active Problems:   Pancreatic duct dilated   Chronic kidney disease, stage V (HCC)   Secondary hyperparathyroidism of renal origin ([place])   Failed kidney transplant   Essential hypertension   Mixed hyperlipidemia   Anxiety disorder   GERD without esophagitis   Gout   Abnormal CT scan, gastrointestinal tract  Resolved Problems:   * No resolved hospital problems. 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  Patient is following up with Dr. Hollie Salk as well as [facility] seeking a third kidney transplant.  Patient presented in the ED with multiple complaints (abdominal pain, right flank pain with dysuria). CT renal protocol reveals mild dilatation of pancreatic duct within the head of pancreas measuring up to 12 mm in diameter concerning for possible postinflammatory stricture or neoplasm.   Patient was admitted for abdominal pain.GI was consulted recommended MRCP. Nephrology was also consulted.  MRCP showed complete resolution of visible pancreatic duct dilatation.  GI recommended outpatient EUS.  Assessment and Plan: Abdominal pain: Patient with known history of failed renal transplant x 2, now essentially CKD stage V found to have inflamed and  edematous transplanted kidney with multiple bouts of pyelonephritis in the past. Patient found to have leukocytosis, Elevated procalcitonin, UA equivocal concerning for pyelonephritis. Given immunocompromised state  Patient initiated on IV Zosyn. Changed to augmentin on d/c Patient also found to have 12 mm dilated pancreatic duct on CT A/P could be the reason for epigastric pain, mildly elevated lipase. GI was consulted,  recommended MRCP which shows complete resolution of visible pancreatic duct dilatation.   Follow-up urine culture, and discontinue antibiotics if able to. GI recommended EUS as an outpatient, continue pantoprazole, antiemetics.   CKD stage V: Patient has extremely advanced chronic kidney disease due to longstanding graft rejection.   She is in process to be considered for third renal transplant. Nephrology is consulted.  Patient has appointment with vascular surgery for HD Access. Serum creatinine at baseline 4.0>3.71 .  No signs of uremia, does not appear volume overloaded or dehydrated. Does not appear toxic.   Essential hypertension: Continue Coreg 25 mg BID, Imdur 30 mg daily Hydralazine 10 mg IV as needed while in hospital   Hyperlipidemia: Continue simvastatin 5 mg daily   Anxiety disorder: Continue alprazolam 0.5 mg at bedtime. Continue hydroxyzine 25 to 50 mg twice daily as needed Continue Zoloft 50 mg daily   GERD: Stable.  Continue pantoprazole 40 mg daily   Gout: Continue allopurinol 100 mg daily   S/p kidney transplant: She follows with Dr. Hollie Salk.  Continue prednisone, tacrolimus, Myfortic   Normochromic normocytic anemia: H&H is stable.  likely secondary to chronic kidney disease       Consultants: Nephrology, GI Procedures performed:   Disposition: Home Diet recommendation:  Renal diet DISCHARGE MEDICATION: Allergies as of  02/17/2022       Reactions   Bactrim [sulfamethoxazole-trimethoprim] Anaphylaxis   Sulfamethoxazole Anaphylaxis    Hydrogen Peroxide Rash   Labetalol    Wound Dressing Adhesive Hives   Zolpidem    Amlodipine Swelling   Hyoscyamine Other (See Comments)   Dizziness   Keflex [cephalexin] Hives, Swelling   Per pt, her face and lips swell up and she develops hives   Adhesive [tape] Rash   Imuran [azathioprine Sodium] Rash   Neosporin [bacitracin-polymyxin B] Rash, Other (See Comments)   Can use that for awhile, but will eventually develop a rash    Tramadol Rash   Warfarin Sodium Rash        Medication List     TAKE these medications    acetaminophen 500 MG tablet Commonly known as: TYLENOL Take 500 mg by mouth every 6 (six) hours as needed for headache (pain).   allopurinol 100 MG tablet Commonly known as: ZYLOPRIM Take 100 mg by mouth daily.   ALPRAZolam 0.5 MG tablet Commonly known as: XANAX Take 0.5 mg by mouth at bedtime.   amoxicillin-clavulanate 500-125 MG tablet Commonly known as: AUGMENTIN Take 1 tablet (500 mg total) by mouth every 12 (twelve) hours for 4 days.   aspirin-acetaminophen-caffeine 250-250-65 MG tablet Commonly known as: EXCEDRIN MIGRAINE Take 1 tablet by mouth every 6 (six) hours as needed for headache.   carvedilol 25 MG tablet Commonly known as: COREG Take 25 mg by mouth 2 (two) times daily with a meal.   famotidine 20 MG tablet Commonly known as: PEPCID Take 1 tablet (20 mg total) by mouth daily.   furosemide 40 MG tablet Commonly known as: LASIX Take 40 mg by mouth daily as needed for fluid or edema.   gabapentin 100 MG capsule Commonly known as: Neurontin Take 1 capsule (100 mg total) by mouth 2 (two) times daily.   hydrOXYzine 25 MG tablet Commonly known as: ATARAX Take 25-50 mg by mouth 2 (two) times daily as needed for dizziness or anxiety.   isosorbide mononitrate 30 MG 24 hr tablet Commonly known as: IMDUR Take 1 tablet (30 mg total) by mouth every morning.   magnesium gluconate 500 MG tablet Commonly known as: MAGONATE Take 500  mg by mouth every evening.   medroxyPROGESTERone 10 MG tablet Commonly known as: PROVERA Take 10 mg by mouth daily.   mycophenolate 360 MG Tbec EC tablet Commonly known as: MYFORTIC Take 360 mg by mouth 2 (two) times daily.   ondansetron 4 MG disintegrating tablet Commonly known as: ZOFRAN-ODT Take 4 mg by mouth as needed for nausea/vomiting.   potassium chloride SA 20 MEQ tablet Commonly known as: KLOR-CON M Take 20 mEq by mouth daily.   predniSONE 5 MG tablet Commonly known as: DELTASONE Take 5 mg by mouth daily.   Renvela 800 MG tablet Generic drug: sevelamer carbonate Take 800 mg by mouth 3 (three) times daily with meals.   sertraline 50 MG tablet Commonly known as: ZOLOFT Take 50 mg by mouth daily.   simvastatin 5 MG tablet Commonly known as: ZOCOR Take 5 mg by mouth at bedtime.   sodium bicarbonate 650 MG tablet Take 1,300 mg by mouth every evening.   tacrolimus 0.5 MG capsule Commonly known as: PROGRAF Take 0.5 mg by mouth 2 (two) times daily. Take along with 1 mg capsule=1.5 mg BID   tacrolimus 1 MG capsule Commonly known as: PROGRAF Take 1 mg by mouth 2 (two) times daily. Take along with 0.5 mg capsule=1.5  mg BID   traZODone 100 MG tablet Commonly known as: DESYREL Take 100 mg by mouth at bedtime.        Follow-up Information     O'Buch, Greta, PA-C Follow up in 2 week(s).   Specialty: Internal Medicine Why: Hospital follow up Contact information: [address] [place] [address] [phone]         Madelon Lips, MD Follow up.   Specialty: Nephrology Why: as scheduled Contact information: [place] [address] [phone]                Discharge Exam: Danley Danker Weights   02/14/22 1452  Weight: 75.8 kg   General exam: Conversant, in no acute distress Respiratory system: normal chest rise, clear, no audible wheezing Cardiovascular system: regular rhythm, s1-s2 Gastrointestinal system:  Nondistended, nontender, pos BS Central nervous system: No seizures, no tremors Extremities: No cyanosis, no joint deformities Skin: No rashes, no pallor Psychiatry: Affect normal // no auditory hallucinations   Condition at discharge: fair  The results of significant diagnostics from this hospitalization (including imaging, microbiology, ancillary and laboratory) are listed below for reference.   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 EXAM: MRI ABDOMEN WITHOUT CONTRAST  (INCLUDING MRCP) TECHNIQUE: Multiplanar multisequence MR imaging of the abdomen was performed. Heavily T2-weighted images of the biliary and pancreatic ducts were obtained, and three-dimensional MRCP images were rendered by post processing. COMPARISON:  CT February 14, 2022. FINDINGS: Lower chest: Unremarkable on MRI to the extent evaluated. Hepatobiliary: Liver with smooth contours. No visible lesion on noncontrast imaging. No pericholecystic fluid or stranding. No biliary duct dilation. Pancreas: Pancreatic duct with minimal irregularity and without substantial dilation, more patulous in the head of the pancreas measuring between 4 and 5 mm. Small cystic lesion in the head of the pancreas measuring 4 mm. The pancreas shows no signs of adjacent inflammation. The duct was clearly dilated beyond this caliber on the recent CT, significance is uncertain. Spleen:  No signs of splenic abnormality. Adrenals/Urinary Tract: Native kidneys are markedly atrophic as on the previous exam. Adrenal glands are normal. Marked enlargement of the transplanted kidney with surrounding edema and a rim of increased T2 signal surrounding the entire kidney compatible with fluid and or edema. The appearance is not substantially changed compared to the very recent CT accounting for differences in technique. There is no visible  hydronephrosis. Renal transplant vessels are not evaluated. Degree of edema in the retroperitoneum may be slightly increased since March of 2023 but show similar distribution. The renal transplant is not fully evaluated on all sequences. There is a rim of fluid signal as stated above in this shows a multi cystic or loculated appearance and based on comparison with CT imaging is unchanged. Stomach/Bowel: Visualized bowel is unremarkable to the extent evaluated on this abdominal MRI. Vascular/Lymphatic:  No adenopathy or vascular dilation. Other: Edema in the retroperitoneum and about the RIGHT flank as described, incompletely assessed currently. Musculoskeletal: No suspicious bone lesions identified. IMPRESSION: 1. Marked enlargement of the transplanted kidney with surrounding edema and or fluid. The appearance is not substantially changed compared to the very recent CT accounting for differences in technique. There is a rim of fluid signal surrounding this transplanted kidney which is enlarged. Differential considerations include chronically infected RIGHT iliac fossa transplant kidney versus rare complication of renal transplant related lymphangiectasia. Ultimately, assessment by  the Renal Transplant Team may be helpful to determine next steps in management. Findings are little changed as far back as March 2023 and actually appear improved when compared to April of 2019. 2. Unusual trauma near complete resolution of visible pancreatic ductal dilation that was seen on the prior study. Transient ductal obstruction is considered but there is no imaging sign of pancreatic inflammation. A small 5 mm lesion is present in the head of the pancreas for which follow-up in 1 year with MRI/MRCP is suggested. There is a change in abdominal symptoms could consider lipase correlation with imaging earlier as warranted for further evaluation. Electronically Signed   By: Zetta Bills M.D.   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 EXAM: MRI ABDOMEN WITHOUT CONTRAST  (INCLUDING MRCP) TECHNIQUE: Multiplanar multisequence MR imaging of the abdomen was performed. Heavily T2-weighted images of the biliary and pancreatic ducts were obtained, and three-dimensional MRCP images were rendered by post processing. COMPARISON:  CT February 14, 2022. FINDINGS: Lower chest: Unremarkable on MRI to the extent evaluated. Hepatobiliary: Liver with smooth contours. No visible lesion on noncontrast imaging. No pericholecystic fluid or stranding. No biliary duct dilation. Pancreas: Pancreatic duct with minimal irregularity and without substantial dilation, more patulous in the head of the pancreas measuring between 4 and 5 mm. Small cystic lesion in the head of the pancreas measuring 4 mm. The pancreas shows no signs of adjacent inflammation. The duct was clearly dilated beyond this caliber on the recent CT, significance is uncertain. Spleen:  No signs of splenic abnormality. Adrenals/Urinary Tract: Native kidneys are markedly atrophic as on the previous exam. Adrenal glands are normal. Marked enlargement of the transplanted kidney with surrounding edema and a rim of increased T2 signal surrounding the entire kidney compatible with fluid and or edema. The appearance is not substantially changed compared to the very recent CT accounting for differences in technique. There is no visible hydronephrosis. Renal transplant vessels are not evaluated. Degree of edema in the retroperitoneum may be slightly increased since March of 2023 but show similar distribution. The renal transplant is not fully evaluated on all sequences. There is a rim of fluid signal as stated above in this shows a multi cystic or loculated appearance and based on comparison with CT imaging is unchanged. Stomach/Bowel: Visualized bowel is unremarkable to the extent  evaluated on this abdominal MRI. Vascular/Lymphatic:  No adenopathy or vascular dilation. Other: Edema in the retroperitoneum and about the RIGHT flank as described, incompletely assessed currently. Musculoskeletal: No suspicious bone lesions identified. IMPRESSION: 1. Marked enlargement of the transplanted kidney with surrounding edema and or fluid. The appearance is not substantially changed compared to the very recent CT accounting for differences in technique. There is a rim of fluid signal surrounding this transplanted kidney which is enlarged. Differential considerations include chronically infected RIGHT iliac fossa transplant kidney versus rare complication of renal transplant related lymphangiectasia. Ultimately, assessment by the Renal Transplant Team may be helpful to determine next steps in management. Findings are little changed as far back as March 2023 and actually appear improved when compared to April of 2019. 2. Unusual trauma near complete resolution of visible pancreatic ductal dilation that was seen on the prior study. Transient ductal obstruction is considered but there is no imaging sign of pancreatic inflammation. A small 5 mm lesion is present in the head of the pancreas  for which follow-up in 1 year with MRI/MRCP is suggested. There is a change in abdominal symptoms could consider lipase correlation with imaging earlier as warranted for further evaluation. Electronically Signed   By: Zetta Bills M.D.   On: 02/15/2022 12:21   CT Renal Stone Study  Result Date: 02/14/2022 CLINICAL DATA:  Flank pain, kidney stone suspected. Right back pain, right flank pain. EXAM: CT ABDOMEN AND PELVIS WITHOUT CONTRAST TECHNIQUE: Multidetector CT imaging of the abdomen and pelvis was performed following the standard protocol without IV contrast. RADIATION DOSE REDUCTION: This exam was performed according to the departmental dose-optimization program which includes automated exposure control, adjustment  of the mA and/or kV according to patient size and/or use of iterative reconstruction technique. COMPARISON:  11/19/2021 FINDINGS: Lower chest: No acute abnormality. Hepatobiliary: No focal liver abnormality is seen. No gallstones, gallbladder wall thickening, or biliary dilatation. Pancreas: There is marked dilation of the pancreatic duct within the head of the pancreas which measures up to 12 mm in diameter, new since prior examination. The duct within the body and tail the pancreas appears decompressed. This is not well characterized on this noncontrast examination. No peripancreatic inflammatory changes or fluid collections are identified. No intraparenchymal calcifications are seen. The pancreatic parenchyma is preserved. Spleen: Unremarkable Adrenals/Urinary Tract: The adrenal glands are unremarkable. The native kidneys are markedly atrophic in keeping with end-stage renal disease. A densely calcified failed transplant kidney is seen within the left iliac fossa. Within the right iliac fossa is a transplant kidney which demonstrates marked enlargement and cortical thickening, diffuse cortical hypoattenuation, and extensive perinephric inflammatory stranding. These findings can be seen the setting of acute pyelonephritis or graft rejection. The findings appear similar to prior examination. There is no hydronephrosis. No discrete drainable perinephric fluid collections are identified. No intrarenal or ureteral calculi are identified. A 7.8 cm probable simple cortical cyst is again identified within the transplant kidney. The bladder is unremarkable. Stomach/Bowel: Mild sigmoid diverticulosis. The stomach, small bowel, and large bowel are otherwise unremarkable. Appendix normal. No free intraperitoneal gas or fluid. Vascular/Lymphatic: Aortic atherosclerosis. No enlarged abdominal or pelvic lymph nodes. Reproductive: Uterus and bilateral adnexa are unremarkable. Other: Asymmetric subcutaneous edema within the right  lower quadrant abdominal wall superficial to the transplant kidney is again seen. Infiltrative changes within the periumbilical region may be post surgical in nature. These appear unchanged. No abdominal wall hernia. No ascites. Musculoskeletal: No acute or significant osseous findings. IMPRESSION: 1. Interval development of marked dilation of the pancreatic duct within the head of the pancreas measuring up to 12 mm in diameter. This is not well characterized on this noncontrast examination. No peripancreatic inflammatory changes or fluid collections are identified. Differential considerations include a main duct IPMN, postinflammatory stricture, or possibly a pancreatic neoplasm. This could be further assessed with contrast enhanced MRI examination or endoscopic ultrasound examination. 2. Stable appearance of the right iliac fossa transplant kidney demonstrating marked enlargement, cortical thickening, and extensive perinephric inflammatory stranding. These findings can be seen the setting of acute pyelonephritis or graft rejection. Correlation with urinalysis and urine culture may be helpful. No hydronephrosis or discrete drainable perinephric fluid collections identified. 3. Mild sigmoid diverticulosis. 4. Asymmetric subcutaneous edema within the right lower quadrant abdominal wall superficial to the transplant kidney is again seen. Infiltrative changes within the periumbilical region may be post surgical in nature. These appear unchanged. No abdominal wall hernia. Aortic Atherosclerosis (ICD10-I70.0). Electronically Signed   By: Fidela Salisbury M.D.   On: 02/14/2022 18:08  US Abdomen Limited RUQ (LIVER/GB)  Result Date: 02/14/2022 CLINICAL DATA:  Right upper quadrant pain for 2 days EXAM: ULTRASOUND ABDOMEN LIMITED RIGHT UPPER QUADRANT COMPARISON:  None Available. FINDINGS: Gallbladder: Mildly contracted gallbladder. No gallstones or wall thickening visualized. No sonographic Murphy sign noted by  sonographer. Common bile duct: Diameter: 0.3 cm Liver: No focal lesion identified. Somewhat coarse contour of the liver. Increased parenchymal echogenicity. Portal vein is patent on color Doppler imaging with normal direction of blood flow towards the liver. Other: None. IMPRESSION: 1. No evidence of cholelithiasis or acute cholecystitis. 2. Somewhat coarse contour of the liver with increased parenchymal echogenicity. Findings suggest cirrhosis. Electronically Signed   By: Delanna Ahmadi M.D.   On: 02/14/2022 17:55   DG Chest 2 View  Result Date: 02/14/2022 CLINICAL DATA:  Shortness of breath. EXAM: CHEST - 2 VIEW COMPARISON:  October 29, 2021. FINDINGS: Mild streaky bibasilar opacities. No confluent consolidation no visible pleural effusions or pneumothorax. Similar cardiomediastinal silhouette. No displaced fracture. IMPRESSION: Mild streaky bibasilar opacities, favor atelectasis. No confluent consolidation. Electronically Signed   By: Margaretha Sheffield M.D.   On: 02/14/2022 15:12    Microbiology: Results for orders placed or performed during the hospital encounter of 02/14/22  Urine Culture     Status: Abnormal   Collection Time: 02/14/22  6:24 PM   Specimen: Urine, Clean Catch  Result Value Ref Range Status   Specimen Description   Final    URINE, CLEAN CATCH Performed at [facility] [place] [address] [place], [place] [address]    Special Requests   Final    NONE Performed at [facility], [place] [address] [place], [place] [address]    Culture 20,000 COLONIES/mL PROTEUS MIRABILIS (A)  Final   Report Status 02/16/2022 FINAL  Final   Organism ID, Bacteria PROTEUS MIRABILIS (A)  Final      Susceptibility   Proteus mirabilis - MIC*    AMPICILLIN <=2 SENSITIVE Sensitive     CEFAZOLIN <=4 SENSITIVE Sensitive     CEFEPIME <=0.12 SENSITIVE Sensitive     CEFTRIAXONE <=0.25 SENSITIVE Sensitive     CIPROFLOXACIN <=0.25 SENSITIVE Sensitive     GENTAMICIN <=1  SENSITIVE Sensitive     IMIPENEM 2 SENSITIVE Sensitive     NITROFURANTOIN 128 RESISTANT Resistant     TRIMETH/SULFA <=20 SENSITIVE Sensitive     AMPICILLIN/SULBACTAM <=2 SENSITIVE Sensitive     PIP/TAZO <=4 SENSITIVE Sensitive     * 20,000 COLONIES/mL PROTEUS MIRABILIS  Culture, blood (Routine X 2) w Reflex to ID Panel     Status: None (Preliminary result)   Collection Time: 02/14/22 10:00 PM   Specimen: BLOOD  Result Value Ref Range Status   Specimen Description   Final    BLOOD BLOOD RIGHT HAND Performed at [place] [address] [place], [place] [address]    Special Requests   Final    BOTTLES DRAWN AEROBIC AND ANAEROBIC Blood Culture results may not be optimal due to an inadequate volume of blood received in culture bottles Performed at [place] [address], [place], [place] [address]    Culture   Final    NO GROWTH 3 DAYS Performed at [facility], [place] [address] [place], [place] [address]    Report Status PENDING  Incomplete  Culture, blood (Routine X 2) w Reflex to ID Panel     Status: None (Preliminary result)   Collection Time: 02/14/22 10:16 PM  Specimen: BLOOD  Result Value Ref Range Status   Specimen Description   Final    BLOOD BLOOD RIGHT WRIST Performed at [place] [address], [place], [place] [address]    Special Requests   Final    BOTTLES DRAWN AEROBIC AND ANAEROBIC Blood Culture adequate volume Performed at [place] [address] [place], [place] [address]    Culture   Final    NO GROWTH 3 DAYS Performed at [facility], [place] [address] [place],  [address]    Report Status PENDING  Incomplete    Labs: CBC: Recent Labs  Lab 02/14/22 1539 02/15/22 0443 02/17/22 0349  WBC 14.7* 11.1* 9.4  NEUTROABS  --  8.8*  --   HGB 10.5* 9.9* 9.9*  HCT 33.4* 31.4* 31.7*  MCV 94.1 93.2 94.3  PLT 198 181 798   Basic Metabolic  Panel: Recent Labs  Lab 02/14/22 1539 02/15/22 0330 02/15/22 1354 02/16/22 0336 02/17/22 0349  NA 139 139  --  139 138  K 4.4 5.3* 5.3* 4.6 3.9  CL 112* 111  --  107 108  CO2 19* 21*  --  21* 20*  GLUCOSE 106* 98  --  87 99  BUN 40* 43*  --  44* 44*  CREATININE 3.90* 4.05*  --  3.71* 3.90*  CALCIUM 8.1* 7.7*  --  8.0* 8.1*  MG  --  1.4*  --   --  1.9  PHOS  --  3.8  --   --  4.7*   Liver Function Tests: Recent Labs  Lab 02/14/22 1539 02/15/22 0330 02/17/22 0349  AST 17 14* 11*  ALT '10 10 9  '$ ALKPHOS 43 42 35*  BILITOT 0.5 0.6 0.5  PROT 6.3* 6.2* 5.8*  ALBUMIN 3.7 3.5 3.3*   CBG: Recent Labs  Lab 02/14/22 1826  GLUCAP 109*    Discharge time spent: less than 30 minutes.  Signed: Marylu Lund, MD [facility] 02/17/2022

## 2022-02-19 ENCOUNTER — Telehealth: Payer: Self-pay

## 2022-02-19 LAB — CULTURE, BLOOD (ROUTINE X 2)
Culture: NO GROWTH
Culture: NO GROWTH
Special Requests: ADEQUATE

## 2022-02-19 NOTE — Telephone Encounter (Signed)
-----   Message from Thornton Park, MD sent at 02/16/2022  2:51 PM EDT ----- Patient is undergoing renal retransplant evaluation at [facility] in [place]. Given the findings in her pancreas on her recent MRI, she needs an EGD EUS. Please arrange a referral to the [facility] in [facility] for this test. Thank you.  KLB

## 2022-02-19 NOTE — Telephone Encounter (Signed)
Referral has been placed to [facility], fax: [phone]. Patient notified.

## 2022-02-24 ENCOUNTER — Other Ambulatory Visit: Payer: Self-pay

## 2022-02-24 DIAGNOSIS — N186 End stage renal disease: Secondary | ICD-10-CM

## 2022-02-26 ENCOUNTER — Encounter (HOSPITAL_COMMUNITY)
Admission: RE | Admit: 2022-02-26 | Discharge: 2022-02-26 | Disposition: A | Discharge status: Home / Self Care | Payer: Medicare Other | Source: Ambulatory Visit | Attending: Nephrology | Admitting: Nephrology

## 2022-02-26 DIAGNOSIS — N2581 Secondary hyperparathyroidism of renal origin: Secondary | ICD-10-CM | POA: Insufficient documentation

## 2022-02-27 DIAGNOSIS — R946 Abnormal results of thyroid function studies: Secondary | ICD-10-CM | POA: Diagnosis not present

## 2022-02-27 DIAGNOSIS — Z940 Kidney transplant status: Secondary | ICD-10-CM | POA: Diagnosis not present

## 2022-02-27 MED ORDER — TECHNETIUM TC 99M SESTAMIBI - CARDIOLITE
25.2000 | Freq: Once | INTRAVENOUS | Status: AC | PRN
  Administered 2022-02-26: 25.2 via INTRAVENOUS

## 2022-02-28 ENCOUNTER — Ambulatory Visit (AMBULATORY_SURGERY_CENTER): Payer: Medicare Other | Admitting: Gastroenterology

## 2022-02-28 ENCOUNTER — Encounter: Payer: Self-pay | Admitting: Gastroenterology

## 2022-02-28 VITALS — BP 181/73 | HR 71 | Temp 97.5°F | Resp 13 | Ht 59.0 in | Wt 169.0 lb

## 2022-02-28 DIAGNOSIS — K5730 Diverticulosis of large intestine without perforation or abscess without bleeding: Secondary | ICD-10-CM

## 2022-02-28 DIAGNOSIS — K648 Other hemorrhoids: Secondary | ICD-10-CM

## 2022-02-28 DIAGNOSIS — R1013 Epigastric pain: Secondary | ICD-10-CM

## 2022-02-28 DIAGNOSIS — K2100 Gastro-esophageal reflux disease with esophagitis, without bleeding: Secondary | ICD-10-CM

## 2022-02-28 DIAGNOSIS — K449 Diaphragmatic hernia without obstruction or gangrene: Secondary | ICD-10-CM | POA: Diagnosis not present

## 2022-02-28 DIAGNOSIS — D128 Benign neoplasm of rectum: Secondary | ICD-10-CM

## 2022-02-28 DIAGNOSIS — K259 Gastric ulcer, unspecified as acute or chronic, without hemorrhage or perforation: Secondary | ICD-10-CM | POA: Diagnosis not present

## 2022-02-28 DIAGNOSIS — D122 Benign neoplasm of ascending colon: Secondary | ICD-10-CM | POA: Diagnosis not present

## 2022-02-28 DIAGNOSIS — K582 Mixed irritable bowel syndrome: Secondary | ICD-10-CM | POA: Diagnosis not present

## 2022-02-28 DIAGNOSIS — D126 Benign neoplasm of colon, unspecified: Secondary | ICD-10-CM

## 2022-02-28 MED ORDER — HYDROCORTISONE (PERIANAL) 2.5 % EX CREA: TOPICAL_CREAM | CUTANEOUS | 0 refills | Status: DC

## 2022-02-28 MED ORDER — PANTOPRAZOLE SODIUM 40 MG PO TBEC: 40.0000 mg | DELAYED_RELEASE_TABLET | Freq: Every day | ORAL | 4 refills | Status: DC

## 2022-02-28 MED ORDER — SODIUM CHLORIDE 0.9 % IV SOLN: 500.0000 mL | Freq: Once | INTRAVENOUS | Status: DC

## 2022-02-28 NOTE — Progress Notes (Signed)
Pt in recovery with monitors in place, VSS. Report given to receiving RN. Bite guard was placed with pt awake to ensure comfort. No dental or soft tissue damage noted. 

## 2022-02-28 NOTE — Progress Notes (Signed)
VS completed by DT.  Medical and surgical history reviewed and updated.    No BP or IV sticks on left side.

## 2022-02-28 NOTE — Progress Notes (Signed)
Called to room to assist during endoscopic procedure.  Patient ID and intended procedure confirmed with present staff. Received instructions for my participation in the procedure from the performing physician.  

## 2022-02-28 NOTE — Op Note (Addendum)
[facility] Patient Name: Ariel [name] Procedure Date: 02/28/2022 8:32 AM MRN: [id number] Endoscopist: Jackquline Denmark , MD Age: 46 Referring MD:  Date of Birth: 26-Aug-1976 Gender: Female Account #: [id number] Procedure:                Upper GI endoscopy Indications:              Epi pain. IDA, GERD Medicines:                Monitored Anesthesia Care Procedure:                Pre-Anesthesia Assessment:                           - Prior to the procedure, a History and Physical                            was performed, and patient medications and                            allergies were reviewed. The patient's tolerance of                            previous anesthesia was also reviewed. The risks                            and benefits of the procedure and the sedation                            options and risks were discussed with the patient.                            All questions were answered, and informed consent                            was obtained. Prior Anticoagulants: The patient has                            taken no previous anticoagulant or antiplatelet                            agents. ASA Grade Assessment: III - A patient with                            severe systemic disease. After reviewing the risks                            and benefits, the patient was deemed in                            satisfactory condition to undergo the procedure.                           After obtaining informed consent, the endoscope was  passed under direct vision. Throughout the                            procedure, the patient's blood pressure, pulse, and                            oxygen saturations were monitored continuously. The                            Endoscope was introduced through the mouth, and                            advanced to the second part of duodenum. The upper                            GI endoscopy was accomplished  without difficulty.                            The patient tolerated the procedure well. Scope In: Scope Out: Findings:                 LA Grade B (one or more mucosal breaks greater than                            5 mm, not extending between the tops of two mucosal                            folds) esophagitis with no bleeding was found 34 to                            35 cm from the incisors at [place]. Biopsies were                            taken with a cold forceps for histology.                           A small hiatal hernia was present.                           One non-bleeding superficial gastric ulcer with no                            stigmata of bleeding was found in the gastric                            antrum. The lesion was 6 mm in largest dimension.                            There was surrounding mild antral gastritis.                            Multiple biopsies were taken with a cold forceps  from ulcer base and edges for histology.                           The examined duodenum was normal. Biopsies for                            histology were taken with a cold forceps for                            evaluation of celiac disease. Complications:            No immediate complications. Estimated Blood Loss:     Estimated blood loss: none. Impression:               - LA Grade B reflux esophagitis with no bleeding.                            Biopsied.                           - Small hiatal hernia.                           - Non-bleeding gastric ulcer with no stigmata of                            bleeding. Biopsied.                           - Normal examined duodenum. Biopsied. Recommendation:           - Patient has a contact number available for                            emergencies. The signs and symptoms of potential                            delayed complications were discussed with the                            patient. Return to  normal activities tomorrow.                            Written discharge instructions were provided to the                            patient.                           - Resume previous diet.                           - Use Protonix (pantoprazole) 40 mg PO daily #90,                            4RF.                           -  Use Pepcid 20 mg p.o. nightly x 2 weeks.                           - No ibuprofen, naproxen, or other non-steroidal                            anti-inflammatory drugs.                           - Await pathology results.                           - The findings and recommendations were discussed                            with the patient's family. Jackquline Denmark, MD 02/28/2022 9:16:02 AM This report has been signed electronically.

## 2022-02-28 NOTE — Progress Notes (Signed)
Chief Complaint: FU  Referring Provider:  [name], PA-C      ASSESSMENT AND PLAN;   #1. GERD with small HH and EE, currently off PPIs d/t nephrology recommendations.  #2. IBS-alt D/C  #3. Ac pancreatitis. Nl LFTs, No gallstones. Resolved  Plan:  -Increase pepcid 13m po BID -Continue fiber supplements. -Obtain results of amy/lipase @ blood drawn friday -EGD/colon (if/when OK with renal transplant) -Also has appt with Dr RConstance Holster-RTC 12 weeks.   HPI:    Ariel Rettkeis a 46y.o. 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  RUQ pain/heartburn/nausea- better with famotidine QD Alt diar/const Bloating better Worse with meals With nausea  Has appt with Dr RConstance Holster  CT 08/2021 no cirrhosis. IMPRESSION: Stable changes in the right iliac renal transplant with significant edematous changes and perinephric stranding.  Stable atrophy of the native kidneys and partially calcified prior left lower quadrant renal transplant.  Diverticulosis without diverticulitis.  Edematous changes in the right lateral abdominal wall the related to the underlying changes in the transplant kidney but stable in appearance from the prior exam     [facility] with RUQ/R flank pain.  She was found to have UTI treated with nitrofurantoin.  She was also told that she had "pancreatitis".  We just got records.  Patient had already left the clinic.  These are summarized below  -Labs showed lipase 5672 with normal LFTs. Alb 4.2  -CT Abdo/pelvis without contrast showed normal pancreas. Persistent edematous appearance of right lower quadrant renal transplant which has been unchanged since 12/2019. -BUN/creatinine 35/2.9 with GFR 18 mL/min -CBC with hemoglobin 14.3, platelets 214K -She  was given oxyIR as needed for abdominal pain.  [facility] (Dr EMadelon Lips.  We do not have those records currently.  She told me that lipase was checked as well.  She has follow-up appointment with Dr. UHollie Salkthis week.  No previous history of pancreatitis.  No alcohol.  No family history of pancreatitis.  No trauma.  He does complain of constipation.    From previous notes -CT Abdo/pelvis without contrast 11/2019 which showed inflammatory area around renal transplant graft.  [facility] not etiology for abdominal bloating.  Has been seen by GYN-no GYN etiology.  Sent to GI clinic for any possible further evaluation.  -Also underwent ultrasound as below 01/2020 which showed ? early liver cirrhosis.  Wt Readings from Last 3 Encounters:  02/28/22 169 lb (76.7 kg)  02/14/22 167 lb (75.8 kg)  12/22/21 170 lb 6.7 oz (77.3 kg)      Previous GI procedures: -Colonoscopy (Dr. BMelina Copa 04/13/2016: neg except for minimal patchy erythema.  Recommend repeating colonoscopy at age 46 Bx- neg R and L colonic bx for microscopic colitis. -EGD 07/31/2017: Distal esophageal ulcers, small HH, erosive gastritis.  Given Prevacid.  Switched to Pepcid after 12 weeks d/t nephrology recommendations. Neg SB Bx for celiac, Neg gastric Bx for HP, Neg eso Bx for CMV/HSV/Candida.  [facility] renal transplant with perirenal and right retroperitoneal stranding.  Findings relatively unchanged since 12/2019.  No transplant hydronephrosis. Normal pancreas.  No gallstones  -CT  Abdo/pelvis without contrast 11/25/2019 1. Again noted is a markedly enlarged right lower quadrant renal graft with diffuse edema and surrounding fat stranding. 2. Increase in volume of ascites. If there is a clinical concern for peritonitis consider further  evaluation with diagnostic paracentesis. 3. Increase in diffuse subcutaneous edema involving the anterior abdominal wall. 4. Bony stigmata of renal osteodystrophy.  [place] 01/08/2020 1. Contracted gallbladder without shadowing stone or biliary dilatation 2. Question subtle contour nodularity of the liver as could be seen with early changes of cirrhosis, correlate with LFTs. Small free fluid in the right upper quadrant adjacent to the liver 3. 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   Anemia    Anemia associated with chronic renal failure    Anorectal disorder 06/08/2020   Anorectal pain 06/08/2020   Anxiety    Back pain    Bloating 04/16/2018   Cancer ([place])    pre cervical   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    of the [place]   Complications due to cardiac device, implant, and graft 09/02/2012   Condyloma of female genitalia    Dehydration 01/19/2019   Depression    Dialysis patient [facility])    Drug-induced bleeding disorder ([place]) 01/19/2019   DVT of axillary vein, acute left ([place]) 08/02/2012   Dysmenorrhea 10/19/2015   Dyspnea on exertion 01/19/2019   Dysuria    ESRD (end stage renal disease) ([place])    sp transplant x 2, did mix of HD and PD from 2001- 2008   Essential hypertension with goal blood pressure less than 130/80    Fatigue    Gastritis, acute    Gout    History of parathyroidectomy ([place] 10/29/2018   History of renal transplant    HTN (hypertension) 01/19/2019   Hypercalcemia    Hypercholesterolemia    Hyperlipidemia    Hypokalemia    Hypomagnesemia    Hypothyroid 01/19/2019   IBS (irritable bowel syndrome)    Immunosuppressive management encounter following kidney transplant 10/29/2018   Internal hemorrhoid 04/22/2018   Iron deficiency anemia 10/19/2015   Irregular uterine bleeding 02/13/2016   Kidney transplant  recipient 10/29/2018   1st transplant [facility]) was 1995- 2001.  Did HD/ PD mix from 2001- 2008. 2nd transplant ([facility]/ [facility]) was in 2008 and is still working as of 12/2021. F/b Dr Hollie Salk ([place]) and [facility] transplant team.   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   Other chronic sinusitis 10/29/2018   Palpitations    Pancreatitis    Perianal lesion 04/22/2018   Phlebitis and thrombophlebitis of other sites 08/02/2012   Plantar fasciitis 07/03/2018   Pruritus ani 06/26/2018   Right lower quadrant abdominal pain 10/29/2018   [facility] spotted fever    Secondary hyperparathyroidism of renal origin [facility])    Secondary hypertension due to renal disease 10/29/2018   Sesamoiditis 07/03/2018   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    Transplanted organ removal status 10/29/2018   Unstable angina ([place]) 01/19/2019   Urinary tract infection    Vulvar dystrophy    Yeast dermatitis 08/14/2018    Past Surgical History:  Procedure Laterality Date   AV FISTULA PLACEMENT  COLONOSCOPY  11/28/2005   [facility]   COLONOSCOPY  04/13/2016   Dr Orlena Sheldon   ESOPHAGOGASTRODUODENOSCOPY  07/31/2017   Distal esophageal ulcers (likely due to gastroeesphageal reflix-biopsied). Small hiatal hernia. Erosive gastritis.   HEMORRHOID SURGERY     2019, and 04/29/2020   INSERTION OF DIALYSIS CATHETER     KIDNEY TRANSPLANT  1995, 2008   KNEE SURGERY Left 2020   PARATHYROIDECTOMY  2004   Portion on the parathyroid gland transplanted in R forearm   REMOVAL OF A DIALYSIS CATHETER     SIMPLE VULVECTOMY  06/19/2011   Partial simple left   SKIN CANCER EXCISION  2016   Per pt, area removed on scalp showed squamous cell carcinoma   TUBAL LIGATION     UPPER GASTROINTESTINAL ENDOSCOPY      Family History  Problem Relation Age of  Onset   Hypertension Mother    Hyperlipidemia Mother    Diabetes Father    Rectal cancer Maternal Grandmother    Colon cancer Maternal Grandmother    Esophageal cancer Neg Hx    Stomach cancer Neg Hx     Social History   Tobacco Use   Smoking status: Never   Smokeless tobacco: Never  Vaping Use   Vaping Use: Never used  Substance Use Topics   Alcohol use: No   Drug use: No    Current Outpatient Medications  Medication Sig Dispense Refill   allopurinol (ZYLOPRIM) 100 MG tablet Take 100 mg by mouth daily.     ALPRAZolam (XANAX) 0.5 MG tablet Take 0.5 mg by mouth at bedtime.   1   aspirin-acetaminophen-caffeine (EXCEDRIN MIGRAINE) 250-250-65 MG tablet Take 1 tablet by mouth every 6 (six) hours as needed for headache. 30 tablet 0   carvedilol (COREG) 25 MG tablet Take 25 mg by mouth 2 (two) times daily with a meal.     furosemide (LASIX) 40 MG tablet Take 40 mg by mouth daily as needed for fluid or edema.      gabapentin (NEURONTIN) 100 MG capsule Take 1 capsule (100 mg total) by mouth 2 (two) times daily. 60 capsule 0   magnesium gluconate (MAGONATE) 500 MG tablet Take 500 mg by mouth every evening.     medroxyPROGESTERone (PROVERA) 10 MG tablet Take 10 mg by mouth daily.  3   mycophenolate (MYFORTIC) 360 MG TBEC EC tablet Take 360 mg by mouth 2 (two) times daily.     ondansetron (ZOFRAN-ODT) 4 MG disintegrating tablet Take 4 mg by mouth as needed for nausea/vomiting.     potassium chloride SA (KLOR-CON M) 20 MEQ tablet Take 20 mEq by mouth daily.     predniSONE (DELTASONE) 5 MG tablet Take 5 mg by mouth daily.       sertraline (ZOLOFT) 50 MG tablet Take 50 mg by mouth daily.     sevelamer carbonate (RENVELA) 800 MG tablet Take 800 mg by mouth 3 (three) times daily with meals.     simvastatin (ZOCOR) 5 MG tablet Take 5 mg by mouth at bedtime.     sodium bicarbonate 650 MG tablet Take 1,300 mg by mouth every evening.     tacrolimus (PROGRAF) 0.5 MG capsule Take 0.5 mg by mouth 2  (two) times daily. Take along with 1 mg capsule=1.5 mg BID     tacrolimus (PROGRAF) 1 MG capsule Take 1 mg by mouth 2 (two) times daily. Take along with 0.5 mg capsule=1.5 mg BID  6   traZODone (DESYREL) 100 MG tablet Take  100 mg by mouth at bedtime.     acetaminophen (TYLENOL) 500 MG tablet Take 500 mg by mouth every 6 (six) hours as needed for headache (pain).     famotidine (PEPCID) 20 MG tablet Take 1 tablet (20 mg total) by mouth daily. 90 tablet 3   hydrOXYzine (ATARAX) 25 MG tablet Take 25-50 mg by mouth 2 (two) times daily as needed for dizziness or anxiety.     isosorbide mononitrate (IMDUR) 30 MG 24 hr tablet Take 1 tablet (30 mg total) by mouth every morning. 90 tablet 3   Current Facility-Administered Medications  Medication Dose Route Frequency Provider Last Rate Last Admin   0.9 %  sodium chloride infusion  500 mL Intravenous Once Jackquline Denmark, MD        Allergies  Allergen Reactions   Bactrim [Sulfamethoxazole-Trimethoprim] Anaphylaxis   Sulfamethoxazole Anaphylaxis   Hydrogen Peroxide Rash   Labetalol    Wound Dressing Adhesive Hives   Zolpidem    Amlodipine Swelling   Hyoscyamine Other (See Comments)    Dizziness    Keflex [Cephalexin] Hives and Swelling    Per pt, her face and lips swell up and she develops hives   Adhesive [Tape] Rash   Imuran [Azathioprine Sodium] Rash   Neosporin [Bacitracin-Polymyxin B] Rash and Other (See Comments)    Can use that for awhile, but will eventually develop a rash    Tramadol Rash   Warfarin Sodium Rash    Review of Systems:  neg     Physical Exam:    BP (!) 135/92   Pulse 78   Temp (!) 97.5 F (36.4 C) (Temporal)   Ht 4' 11" (1.499 m)   Wt 169 lb (76.7 kg)   SpO2 99%   BMI 34.13 kg/m  Wt Readings from Last 3 Encounters:  02/28/22 169 lb (76.7 kg)  02/14/22 167 lb (75.8 kg)  12/22/21 170 lb 6.7 oz (77.3 kg)   Constitutional:  Well-developed, in no acute distress. Psychiatric: Normal mood and affect.  Behavior is normal. HEENT: Pupils normal.  Conjunctivae are normal. No scleral icterus. Neck supple.  Cardiovascular: Normal rate, regular rhythm. No edema Pulmonary/chest: Effort normal and breath sounds normal. No wheezing, rales or rhonchi. Abdominal: Soft, nondistended. Nontender. Bowel sounds active throughout. There are no masses palpable. No hepatomegaly.  Enlarged renal graft right lower quadrant with multiple scars Rectal:  defered Neurological: Alert and oriented to person place and time. Skin: Skin is warm and dry. No rashes noted.  Data Reviewed: I have personally reviewed following labs and imaging studies  CBC:    Latest Ref Rng & Units 02/17/2022    3:49 AM 02/15/2022    4:43 AM 02/14/2022    3:39 PM  CBC  WBC 4.0 - 10.5 K/uL 9.4  11.1  14.7   Hemoglobin 12.0 - 15.0 g/dL 9.9  9.9  10.5   Hematocrit 36.0 - 46.0 % 31.7  31.4  33.4   Platelets 150 - 400 K/uL 196  181  198     CMP:    Latest Ref Rng & Units 02/17/2022    3:49 AM 02/16/2022    3:36 AM 02/15/2022    1:54 PM  CMP  Glucose 70 - 99 mg/dL 99  87    BUN 6 - 20 mg/dL 44  44    Creatinine 0.44 - 1.00 mg/dL 3.90  3.71    Sodium 135 - 145 mmol/L 138  139    Potassium 3.5 - 5.1 mmol/L  3.9  4.6  5.3   Chloride 98 - 111 mmol/L 108  107    CO2 22 - 32 mmol/L 20  21    Calcium 8.9 - 10.3 mg/dL 8.1  8.0    Total Protein 6.5 - 8.1 g/dL 5.8     Total Bilirubin 0.3 - 1.2 mg/dL 0.5     Alkaline Phos 38 - 126 U/L 35     AST 15 - 41 U/L 11     ALT 0 - 44 U/L 9         Latest Ref Rng & Units 02/17/2022    3:49 AM 02/15/2022    3:30 AM 02/14/2022    3:39 PM  Hepatic Function  Total Protein 6.5 - 8.1 g/dL 5.8  6.2  6.3   Albumin 3.5 - 5.0 g/dL 3.3  3.5  3.7   AST 15 - 41 U/L _0 ALT 0 - 44 U/L _1 Alk Phosphatase 38 - 126 U/L 35  42  43   Total Bilirubin 0.3 - 1.2 mg/dL 0.5  0.6  0.5   Bilirubin, Direct 0.0 - 0.2 mg/dL  0.1  0.1      Carmell Austria, MD 02/28/2022, 8:33 AM  Cc: Ariel Limbo,  PA-C

## 2022-02-28 NOTE — Op Note (Signed)
[facility] Patient Name: Ariel [name] Procedure Date: 02/28/2022 8:31 AM MRN: [id number] Endoscopist: Jackquline Denmark , MD Age: 46 Referring MD:  Date of Birth: 1976/03/03 Gender: Female Account #: [id number] Procedure:                Colonoscopy Indications:              Iron deficiency anemia. IBS with alternating                            diarrhea and constipation. Medicines:                Monitored Anesthesia Care Procedure:                Pre-Anesthesia Assessment:                           - Prior to the procedure, a History and Physical                            was performed, and patient medications and                            allergies were reviewed. The patient's tolerance of                            previous anesthesia was also reviewed. The risks                            and benefits of the procedure and the sedation                            options and risks were discussed with the patient.                            All questions were answered, and informed consent                            was obtained. Prior Anticoagulants: The patient has                            taken no previous anticoagulant or antiplatelet                            agents. ASA Grade Assessment: III - A patient with                            severe systemic disease. After reviewing the risks                            and benefits, the patient was deemed in                            satisfactory condition to undergo the procedure.  After obtaining informed consent, the colonoscope                            was passed under direct vision. Throughout the                            procedure, the patient's blood pressure, pulse, and                            oxygen saturations were monitored continuously. The                            PCF-HQ190L Colonoscope was introduced through the                            anus and advanced to the 2 cm  into the ileum. The                            colonoscopy was performed without difficulty. The                            patient tolerated the procedure well. The quality                            of the bowel preparation was good. The ileocecal                            valve, appendiceal orifice, and rectum were                            photographed. Scope In: 8:52:45 AM Scope Out: 9:10:41 AM Scope Withdrawal Time: 0 hours 13 minutes 58 seconds  Total Procedure Duration: 0 hours 17 minutes 56 seconds  Findings:                 A 6 mm polyp was found in the distal rectum. The                            polyp was sessile. The polyp was removed with a                            cold snare. Resection and retrieval were complete.                           A patchy mild colitis without erosions or ulcers                            was noted in the sigmoid colon and in the                            descending colon (likely due to prep). Biopsies                            were  taken with a cold forceps for histology.                            Multiple biopsies were also obtained from the right                            colon and sent for histology. A separate jar.                           A few medium-mouthed diverticula were found in the                            sigmoid colon.                           The terminal ileum appeared normal.                           Non-bleeding internal hemorrhoids were found during                            retroflexion. The hemorrhoids were small and Grade                            I (internal hemorrhoids that do not prolapse).                           The perianal exam findings include a perianal rash                            with nodules and excoriation. Complications:            No immediate complications. Estimated Blood Loss:     Estimated blood loss: none. Impression:               - One 6 mm polyp in the distal rectum, removed with                             a cold snare. Resected and retrieved.                           - Mild patchy colitis left colon (likely due to                            prep) biopsied.                           - Diverticulosis in the sigmoid colon.                           - The examined portion of the ileum was normal.                           - Non-bleeding internal hemorrhoids.                           -  Perianal rash found on perianal exam. Recommendation:           - Patient has a contact number available for                            emergencies. The signs and symptoms of potential                            delayed complications were discussed with the                            patient. Return to normal activities tomorrow.                            Written discharge instructions were provided to the                            patient.                           - Resume previous diet.                           - Continue present medications.                           - Await pathology results.                           - Repeat colonoscopy for surveillance based on                            pathology results.                           - HC 2.5 % BID x 10 days perianal area.                           - Also, Recommend dermatology consultation for                            perianal rash. Jackquline Denmark, MD 02/28/2022 9:26:42 AM This report has been signed electronically.

## 2022-02-28 NOTE — Patient Instructions (Addendum)
Upper Endoscopy:   Handouts on esophagitis and hiatal hernia given to you  Await pathology results on biopsies done     NO ASPIRIN,NAPROXEN,IBUPROFEN ,OR OTHER NON STEROIDAL ANTI INFLAMMATORY MEDICATIONS      Use Protonix 40 mg daily  - order sent to pharmacy   Use Pepcid 20 mg nightly for 2 weeks    Colonoscopy:   Handouts on polyps ,diverticulosis,& hemorrhoids given to you   Await pathology results on polyp removed and on biopsy results of Colitis   HC 2.5% twice a day to perianal area - sent to your pharmacy  Dr Lyndel Safe recommends dermatologist consult for perianal rash    YOU HAD AN ENDOSCOPIC PROCEDURE TODAY AT [facility]:   Refer to the procedure report that was given to you for any specific questions about what was found during the examination.  If the procedure report does not answer your questions, please call your gastroenterologist to clarify.  If you requested that your care partner not be given the details of your procedure findings, then the procedure report has been included in a sealed envelope for you to review at your convenience later.  YOU SHOULD EXPECT: Some feelings of bloating in the abdomen. Passage of more gas than usual.  Walking can help get rid of the air that was put into your GI tract during the procedure and reduce the bloating. If you had a lower endoscopy (such as a colonoscopy or flexible sigmoidoscopy) you may notice spotting of blood in your stool or on the toilet paper. If you underwent a bowel prep for your procedure, you may not have a normal bowel movement for a few days.  Please Note:  You might notice some irritation and congestion in your nose or some drainage.  This is from the oxygen used during your procedure.  There is no need for concern and it should clear up in a day or so.  SYMPTOMS TO REPORT IMMEDIATELY:  Following lower endoscopy (colonoscopy or flexible sigmoidoscopy):  Excessive amounts of blood in the  stool  Significant tenderness or worsening of abdominal pains  Swelling of the abdomen that is new, acute  Fever of 100F or higher  Following upper endoscopy (EGD)  Vomiting of blood or coffee ground material  New chest pain or pain under the shoulder blades  Painful or persistently difficult swallowing  New shortness of breath  Fever of 100F or higher  Black, tarry-looking stools  For urgent or emergent issues, a gastroenterologist can be reached at any hour by calling [phone]. Do not use MyChart messaging for urgent concerns.    DIET:  We do recommend a small meal at first, but then you may proceed to your regular diet.  Drink plenty of fluids but you should avoid alcoholic beverages for 24 hours.  ACTIVITY:  You should plan to take it easy for the rest of today and you should NOT DRIVE or use heavy machinery until tomorrow (because of the sedation medicines used during the test).    FOLLOW UP: Our staff will call the number listed on your records the next business day following your procedure.  We will call around 7:15- 8:00 am to check on you and address any questions or concerns that you may have regarding the information given to you following your procedure. If we do not reach you, we will leave a message.  If you develop any symptoms (ie: fever, flu-like symptoms, shortness of breath, cough etc.) before then, please  call [phone].  If you test positive for Covid 19 in the 2 weeks post procedure, please call and report this information to [place].    If any biopsies were taken you will be contacted by phone or by letter within the next 1-3 weeks.  Please call us at [phone] if you have not heard about the biopsies in 3 weeks.    SIGNATURES/CONFIDENTIALITY: You and/or your care partner have signed paperwork which will be entered into your electronic medical record.  These signatures attest to the fact that that the information above on your After Visit Summary has  been reviewed and is understood.  Full responsibility of the confidentiality of this discharge information lies with you and/or your care-partner.

## 2022-03-01 ENCOUNTER — Telehealth: Payer: Self-pay

## 2022-03-01 NOTE — Telephone Encounter (Signed)
  Follow up Call-     02/28/2022    7:48 AM  Call back number  Post procedure Call Back phone  # [phone]  Permission to leave phone message Yes     Patient questions:  Do you have a fever, pain , or abdominal swelling? No. Pain Score  0 *  Have you tolerated food without any problems? Yes.    Have you been able to return to your normal activities? Yes.    Do you have any questions about your discharge instructions: Diet   No. Medications  No. Follow up visit  No.  Do you have questions or concerns about your Care? No.  Actions: * If pain score is 4 or above: No action needed, pain <4.

## 2022-03-02 ENCOUNTER — Encounter: Payer: Self-pay | Admitting: Gastroenterology

## 2022-03-05 DIAGNOSIS — Z7682 Awaiting organ transplant status: Secondary | ICD-10-CM | POA: Diagnosis not present

## 2022-03-05 DIAGNOSIS — F4323 Adjustment disorder with mixed anxiety and depressed mood: Secondary | ICD-10-CM | POA: Diagnosis not present

## 2022-03-05 DIAGNOSIS — Z008 Encounter for other general examination: Secondary | ICD-10-CM | POA: Diagnosis not present

## 2022-03-05 DIAGNOSIS — F4542 Pain disorder with related psychological factors: Secondary | ICD-10-CM | POA: Diagnosis not present

## 2022-03-14 DIAGNOSIS — N390 Urinary tract infection, site not specified: Secondary | ICD-10-CM | POA: Diagnosis not present

## 2022-03-22 NOTE — Progress Notes (Unsigned)
Office Note     CC:  ESRD Requesting Provider:  [name], PA-C  HPI: Ariel [name] is a Right handed 46 y.o. (1976/05/20) female with kidney disease who presents at the request of Leona, PA-C for permanent HD access. The patient has had multiple prior access procedures on the left upper extremity. Per pt, previous tunneled lines have been placed in bilateral IJ's.  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  She is undergone kidney transplant x2 with the first 1 lasting 8 years, second lasting 15 years.  She currently lives in [place] with her mother and lives on disability due to her illness.  On exam today, Ariel [name] was doing well, accompanied by her mother.  Her current transplant has started to fail, necessitating long-term HD access.   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   Anemia    Anemia associated with chronic renal failure    Anorectal disorder 06/08/2020   Anorectal pain 06/08/2020   Anxiety    Back pain    Bloating 04/16/2018   Cancer ([place])    pre cervical   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    of the [facility]   Complications due to cardiac device, implant, and graft 09/02/2012   Condyloma of female genitalia    Dehydration 01/19/2019   Depression    Dialysis patient [facility])    Drug-induced bleeding disorder ([place]) 01/19/2019   DVT of axillary vein, acute left ([place]) 08/02/2012   Dysmenorrhea 10/19/2015   Dyspnea on exertion 01/19/2019   Dysuria    ESRD (end stage renal disease) ([place])    sp transplant x 2, did mix of HD and PD from 2001- 2008   Essential hypertension with goal blood pressure less than 130/80    Fatigue    Gastritis, acute    Gout    History of parathyroidectomy ([place]) 10/29/2018   History of renal transplant    HTN (hypertension) 01/19/2019   Hypercalcemia     Hypercholesterolemia    Hyperlipidemia    Hypokalemia    Hypomagnesemia    Hypothyroid 01/19/2019   IBS (irritable bowel syndrome)    Immunosuppressive management encounter following kidney transplant 10/29/2018   Internal hemorrhoid 04/22/2018   Iron deficiency anemia 10/19/2015   Irregular uterine bleeding 02/13/2016   Kidney transplant recipient 10/29/2018   1st transplant [facility]) was 1995- 2001.  Did HD/ PD mix from 2001- 2008. 2nd transplant ([facility]/ [facility]) was in 2008 and is still working as of 12/2021. F/b Dr Hollie Salk ([place]   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   Other chronic sinusitis 10/29/2018   Palpitations    Pancreatitis    Perianal lesion 04/22/2018   Phlebitis and thrombophlebitis of other sites 08/02/2012   Plantar fasciitis 07/03/2018   Pruritus ani 06/26/2018   Right lower quadrant abdominal pain 10/29/2018   [facility] spotted fever    Secondary hyperparathyroidism of renal origin [facility])    Secondary hypertension due to renal disease 10/29/2018   Sesamoiditis 07/03/2018   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    Transplanted organ removal status 10/29/2018   Unstable  angina ([place]) 01/19/2019   Urinary tract infection    Vulvar dystrophy    Yeast dermatitis 08/14/2018    Past Surgical History:  Procedure Laterality Date   AV FISTULA PLACEMENT     COLONOSCOPY  11/28/2005   [facility]   COLONOSCOPY  04/13/2016   Dr Orlena Sheldon   ESOPHAGOGASTRODUODENOSCOPY  07/31/2017   Distal esophageal ulcers (likely due to gastroeesphageal reflix-biopsied). Small hiatal hernia. Erosive gastritis.   HEMORRHOID SURGERY     2019, and 04/29/2020   INSERTION OF DIALYSIS CATHETER     KIDNEY TRANSPLANT  1995, 2008   KNEE SURGERY Left 2020   PARATHYROIDECTOMY  2004    Portion on the parathyroid gland transplanted in R forearm   REMOVAL OF A DIALYSIS CATHETER     SIMPLE VULVECTOMY  06/19/2011   Partial simple left   SKIN CANCER EXCISION  2016   Per pt, area removed on scalp showed squamous cell carcinoma   TUBAL LIGATION     UPPER GASTROINTESTINAL ENDOSCOPY      Social History   Socioeconomic History   Marital status: Divorced    Spouse name: Not on file   Number of children: Not on file   Years of education: Not on file   Highest education level: Not on file  Occupational History   Not on file  Tobacco Use   Smoking status: Never   Smokeless tobacco: Never  Vaping Use   Vaping Use: Never used  Substance and Sexual Activity   Alcohol use: No   Drug use: No   Sexual activity: Not on file  Other Topics Concern   Not on file  Social History Narrative   Not on file   Social Determinants of Health   Financial Resource Strain: Not on file  Food Insecurity: Not on file  Transportation Needs: Not on file  Physical Activity: Not on file  Stress: Not on file  Social Connections: Not on file  Intimate Partner Violence: Not on file   Family History  Problem Relation Age of Onset   Hypertension Mother    Hyperlipidemia Mother    Diabetes Father    Rectal cancer Maternal Grandmother    Colon cancer Maternal Grandmother    Esophageal cancer Neg Hx    Stomach cancer Neg Hx     Current Outpatient Medications  Medication Sig Dispense Refill   acetaminophen (TYLENOL) 500 MG tablet Take 500 mg by mouth every 6 (six) hours as needed for headache (pain).     allopurinol (ZYLOPRIM) 100 MG tablet Take 100 mg by mouth daily.     ALPRAZolam (XANAX) 0.5 MG tablet Take 0.5 mg by mouth at bedtime.   1   aspirin-acetaminophen-caffeine (EXCEDRIN MIGRAINE) 250-250-65 MG tablet Take 1 tablet by mouth every 6 (six) hours as needed for headache. 30 tablet 0   carvedilol (COREG) 25 MG tablet Take 25 mg by mouth 2 (two) times daily with a meal.      famotidine (PEPCID) 20 MG tablet Take 1 tablet (20 mg total) by mouth daily. 90 tablet 3   furosemide (LASIX) 40 MG tablet Take 40 mg by mouth daily as needed for fluid or edema.      gabapentin (NEURONTIN) 100 MG capsule Take 1 capsule (100 mg total) by mouth 2 (two) times daily. 60 capsule 0   hydrocortisone (ANUSOL-HC) 2.5 % rectal cream HC 2.5% BID x 10 days perianal area 28 g 0   hydrOXYzine (ATARAX) 25 MG tablet Take 25-50 mg by mouth  2 (two) times daily as needed for dizziness or anxiety.     isosorbide mononitrate (IMDUR) 30 MG 24 hr tablet Take 1 tablet (30 mg total) by mouth every morning. 90 tablet 3   magnesium gluconate (MAGONATE) 500 MG tablet Take 500 mg by mouth every evening.     medroxyPROGESTERone (PROVERA) 10 MG tablet Take 10 mg by mouth daily.  3   mycophenolate (MYFORTIC) 360 MG TBEC EC tablet Take 360 mg by mouth 2 (two) times daily.     ondansetron (ZOFRAN-ODT) 4 MG disintegrating tablet Take 4 mg by mouth as needed for nausea/vomiting.     pantoprazole (PROTONIX) 40 MG tablet Take 1 tablet (40 mg total) by mouth daily. 90 tablet 4   potassium chloride SA (KLOR-CON M) 20 MEQ tablet Take 20 mEq by mouth daily.     predniSONE (DELTASONE) 5 MG tablet Take 5 mg by mouth daily.       sertraline (ZOLOFT) 50 MG tablet Take 50 mg by mouth daily.     sevelamer carbonate (RENVELA) 800 MG tablet Take 800 mg by mouth 3 (three) times daily with meals.     simvastatin (ZOCOR) 5 MG tablet Take 5 mg by mouth at bedtime.     sodium bicarbonate 650 MG tablet Take 1,300 mg by mouth every evening.     tacrolimus (PROGRAF) 0.5 MG capsule Take 0.5 mg by mouth 2 (two) times daily. Take along with 1 mg capsule=1.5 mg BID     tacrolimus (PROGRAF) 1 MG capsule Take 1 mg by mouth 2 (two) times daily. Take along with 0.5 mg capsule=1.5 mg BID  6   traZODone (DESYREL) 100 MG tablet Take 100 mg by mouth at bedtime.     No current facility-administered medications for this visit.    Allergies   Allergen Reactions   Bactrim [Sulfamethoxazole-Trimethoprim] Anaphylaxis   Sulfamethoxazole Anaphylaxis   Hydrogen Peroxide Rash   Labetalol    Wound Dressing Adhesive Hives   Zolpidem    Amlodipine Swelling   Hyoscyamine Other (See Comments)    Dizziness    Keflex [Cephalexin] Hives and Swelling    Per pt, her face and lips swell up and she develops hives   Adhesive [Tape] Rash   Imuran [Azathioprine Sodium] Rash   Neosporin [Bacitracin-Polymyxin B] Rash and Other (See Comments)    Can use that for awhile, but will eventually develop a rash    Tramadol Rash   Warfarin Sodium Rash     REVIEW OF SYSTEMS:  '[X]'$  denotes positive finding, '[ ]'$  denotes negative finding Cardiac  Comments:  Chest pain or chest pressure:    Shortness of breath upon exertion:    Short of breath when lying flat:    Irregular heart rhythm:        Vascular    Pain in calf, thigh, or hip brought on by ambulation:    Pain in feet at night that wakes you up from your sleep:     Blood clot in your veins:    Leg swelling:         Pulmonary    Oxygen at home:    Productive cough:     Wheezing:         Neurologic    Sudden weakness in arms or legs:     Sudden numbness in arms or legs:     Sudden onset of difficulty speaking or slurred speech:    Temporary loss of vision in one eye:  Problems with dizziness:         Gastrointestinal    Blood in stool:     Vomited blood:         Genitourinary    Burning when urinating:     Blood in urine:        Psychiatric    Major depression:         Hematologic    Bleeding problems:    Problems with blood clotting too easily:        Skin    Rashes or ulcers:        Constitutional    Fever or chills:      PHYSICAL EXAMINATION:  There were no vitals filed for this visit.  General:  WDWN in NAD; vital signs documented above Gait: Not observed HENT: WNL, normocephalic Pulmonary: normal non-labored breathing , without Rales, rhonchi,   wheezing Cardiac: regular HR Abdomen: soft, NT, no masses Skin: without rashes Vascular Exam/Pulses:  Right Left  Radial 2+ (normal) 2+ (normal)  Ulnar 1+ (weak) 1+ (weak)                   Extremities: without ischemic changes, without Gangrene , without cellulitis; without open wounds;  Musculoskeletal: no muscle wasting or atrophy  Neurologic: A&O X 3;  No focal weakness or paresthesias are detected Psychiatric:  The pt has Normal affect.   Non-Invasive Vascular Imaging:   Right Cephalic   Diameter (cm)Depth (cm)Findings  +-----------------+-------------+----------+--------+  Shoulder             0.41                         +-----------------+-------------+----------+--------+  Prox upper arm       0.42                         +-----------------+-------------+----------+--------+  Mid upper arm        0.37                         +-----------------+-------------+----------+--------+  Dist upper arm       0.37                         +-----------------+-------------+----------+--------+  Antecubital fossa 0.42 / 0.56                     +-----------------+-------------+----------+--------+  Prox forearm         0.30                joins    +-----------------+-------------+----------+--------+  Mid forearm          0.17                         +-----------------+-------------+----------+--------+  Dist forearm         0.13                         +-----------------+-------------+----------+--------+   +-----------------+-------------+----------+---------+  Right Basilic    Diameter (cm)Depth (cm)Findings   +-----------------+-------------+----------+---------+  Prox upper arm    0.40 / 0.36           branching  +-----------------+-------------+----------+---------+  Mid upper arm        0.39                          +-----------------+-------------+----------+---------+  Dist upper arm       0.41                           +-----------------+-------------+----------+---------+  Antecubital fossa    0.49                          +-----------------+-------------+----------+---------+  Prox forearm         0.31                          +-----------------+-------------+----------+---------+     ASSESSMENT/PLAN:  Elby Showers Benzel is a 46 y.o. female who presents with chronic kidney disease stage 4  Based on vein mapping and examination, she has an adequate cephalic and basilic veins in the right arm. I had an extensive discussion with this patient in regards to the nature of access surgery, including risk, benefits, and alternatives.   The patient is aware that the risks of access surgery include but are not limited to: bleeding, infection, steal syndrome, nerve damage, ischemic monomelic neuropathy, failure of access to mature, complications related to venous hypertension, and possible need for additional access procedures in the future.  I discussed with the patient the nature of the staged access procedure that she may need due to the size of her arm, specifically the need for a second operation to transpose the first stage fistula if it matures adequately.   After discussing the risk and benefits of right-sided brachiocephalic fistula creation, Tanairy elected to proceed  [name], MD [facility] [phone]

## 2022-03-23 ENCOUNTER — Ambulatory Visit (HOSPITAL_COMMUNITY)
Admission: RE | Admit: 2022-03-23 | Discharge: 2022-03-23 | Disposition: A | Discharge status: Home / Self Care | Payer: Medicare Other | Source: Ambulatory Visit | Attending: Vascular Surgery | Admitting: Vascular Surgery

## 2022-03-23 ENCOUNTER — Ambulatory Visit (INDEPENDENT_AMBULATORY_CARE_PROVIDER_SITE_OTHER): Payer: Medicare Other | Admitting: Vascular Surgery

## 2022-03-23 ENCOUNTER — Ambulatory Visit (INDEPENDENT_AMBULATORY_CARE_PROVIDER_SITE_OTHER)
Admission: RE | Admit: 2022-03-23 | Discharge: 2022-03-23 | Disposition: A | Discharge status: Home / Self Care | Payer: Medicare Other | Source: Ambulatory Visit | Attending: Vascular Surgery | Admitting: Vascular Surgery

## 2022-03-23 ENCOUNTER — Encounter: Payer: Self-pay | Admitting: Vascular Surgery

## 2022-03-23 VITALS — BP 171/108 | HR 67 | Temp 97.5°F | Resp 20 | Ht 59.0 in | Wt 173.0 lb

## 2022-03-23 DIAGNOSIS — N186 End stage renal disease: Secondary | ICD-10-CM

## 2022-03-23 DIAGNOSIS — N184 Chronic kidney disease, stage 4 (severe): Secondary | ICD-10-CM

## 2022-03-26 DIAGNOSIS — N2581 Secondary hyperparathyroidism of renal origin: Secondary | ICD-10-CM | POA: Diagnosis not present

## 2022-03-26 DIAGNOSIS — E872 Acidosis, unspecified: Secondary | ICD-10-CM | POA: Diagnosis not present

## 2022-03-26 DIAGNOSIS — N390 Urinary tract infection, site not specified: Secondary | ICD-10-CM | POA: Diagnosis not present

## 2022-03-26 DIAGNOSIS — I10 Essential (primary) hypertension: Secondary | ICD-10-CM | POA: Diagnosis not present

## 2022-03-26 DIAGNOSIS — D631 Anemia in chronic kidney disease: Secondary | ICD-10-CM | POA: Diagnosis not present

## 2022-03-26 DIAGNOSIS — Z940 Kidney transplant status: Secondary | ICD-10-CM | POA: Diagnosis not present

## 2022-03-26 DIAGNOSIS — N189 Chronic kidney disease, unspecified: Secondary | ICD-10-CM | POA: Diagnosis not present

## 2022-03-29 DIAGNOSIS — J209 Acute bronchitis, unspecified: Secondary | ICD-10-CM | POA: Diagnosis not present

## 2022-04-03 DIAGNOSIS — D072 Carcinoma in situ of vagina: Secondary | ICD-10-CM | POA: Diagnosis not present

## 2022-04-03 DIAGNOSIS — D071 Carcinoma in situ of vulva: Secondary | ICD-10-CM | POA: Diagnosis not present

## 2022-04-05 DIAGNOSIS — K820 Obstruction of gallbladder: Secondary | ICD-10-CM | POA: Diagnosis not present

## 2022-04-05 DIAGNOSIS — R748 Abnormal levels of other serum enzymes: Secondary | ICD-10-CM | POA: Diagnosis not present

## 2022-04-05 DIAGNOSIS — K859 Acute pancreatitis without necrosis or infection, unspecified: Secondary | ICD-10-CM | POA: Diagnosis not present

## 2022-04-05 DIAGNOSIS — K8689 Other specified diseases of pancreas: Secondary | ICD-10-CM | POA: Diagnosis not present

## 2022-04-05 DIAGNOSIS — K858 Other acute pancreatitis without necrosis or infection: Secondary | ICD-10-CM | POA: Diagnosis not present

## 2022-04-05 DIAGNOSIS — K862 Cyst of pancreas: Secondary | ICD-10-CM | POA: Diagnosis not present

## 2022-04-09 ENCOUNTER — Telehealth: Payer: Self-pay

## 2022-04-09 DIAGNOSIS — M1712 Unilateral primary osteoarthritis, left knee: Secondary | ICD-10-CM | POA: Diagnosis not present

## 2022-04-09 DIAGNOSIS — M94262 Chondromalacia, left knee: Secondary | ICD-10-CM | POA: Diagnosis not present

## 2022-04-09 NOTE — Telephone Encounter (Addendum)
Pt's mother, Virgel Paling, called asking if the pt could use her L arm, where the old fistula is, to take blood. She stated that Dr Hollie Salk said absolutely not, even though Dr [name] said she could, and she needs confirmation before agreeing to surgery.   Reviewed pt's chart, returned call, two identifiers used. Pt's mother was very unclear about "taking blood". Pt wants to be able to use L arm for any lab work now and in the future. At first it seemed as if were speaking of using the L arm for HD treatment.  Called Dr Bishop [place] office for further clarification, lf vm.  Dr Bishop [place] office called stating they hadn't seen her in a while and were not following her. The concern that the RN discovered was that the pt had parathyroid autografts implanted in her R arm.   Staff msg sent to Dr [name] to advise on whether arm could be used for fistula.

## 2022-04-10 DIAGNOSIS — R600 Localized edema: Secondary | ICD-10-CM | POA: Diagnosis not present

## 2022-04-17 DIAGNOSIS — D485 Neoplasm of uncertain behavior of skin: Secondary | ICD-10-CM | POA: Diagnosis not present

## 2022-04-17 DIAGNOSIS — D045 Carcinoma in situ of skin of trunk: Secondary | ICD-10-CM | POA: Diagnosis not present

## 2022-04-17 DIAGNOSIS — B356 Tinea cruris: Secondary | ICD-10-CM | POA: Diagnosis not present

## 2022-04-19 DIAGNOSIS — D071 Carcinoma in situ of vulva: Secondary | ICD-10-CM | POA: Diagnosis not present

## 2022-04-19 DIAGNOSIS — D0730 Carcinoma in situ of unspecified female genital organs: Secondary | ICD-10-CM | POA: Diagnosis not present

## 2022-04-19 DIAGNOSIS — D013 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: Secondary | ICD-10-CM | POA: Diagnosis not present

## 2022-04-25 DIAGNOSIS — N2581 Secondary hyperparathyroidism of renal origin: Secondary | ICD-10-CM | POA: Diagnosis not present

## 2022-04-27 ENCOUNTER — Other Ambulatory Visit: Payer: Self-pay | Admitting: *Deleted

## 2022-04-27 DIAGNOSIS — Z940 Kidney transplant status: Secondary | ICD-10-CM | POA: Diagnosis not present

## 2022-04-27 DIAGNOSIS — N186 End stage renal disease: Secondary | ICD-10-CM

## 2022-04-27 DIAGNOSIS — N2581 Secondary hyperparathyroidism of renal origin: Secondary | ICD-10-CM | POA: Diagnosis not present

## 2022-05-01 DIAGNOSIS — N186 End stage renal disease: Secondary | ICD-10-CM | POA: Diagnosis not present

## 2022-05-01 DIAGNOSIS — H6993 Unspecified Eustachian tube disorder, bilateral: Secondary | ICD-10-CM | POA: Diagnosis not present

## 2022-05-01 DIAGNOSIS — H6121 Impacted cerumen, right ear: Secondary | ICD-10-CM | POA: Diagnosis not present

## 2022-05-01 DIAGNOSIS — H61303 Acquired stenosis of external ear canal, unspecified, bilateral: Secondary | ICD-10-CM | POA: Diagnosis not present

## 2022-05-01 DIAGNOSIS — J342 Deviated nasal septum: Secondary | ICD-10-CM | POA: Diagnosis not present

## 2022-05-01 DIAGNOSIS — H9192 Unspecified hearing loss, left ear: Secondary | ICD-10-CM | POA: Diagnosis not present

## 2022-05-08 ENCOUNTER — Other Ambulatory Visit: Payer: Self-pay

## 2022-05-08 ENCOUNTER — Other Ambulatory Visit: Payer: Self-pay | Admitting: Surgery

## 2022-05-08 DIAGNOSIS — N2581 Secondary hyperparathyroidism of renal origin: Secondary | ICD-10-CM

## 2022-05-08 DIAGNOSIS — I10 Essential (primary) hypertension: Secondary | ICD-10-CM | POA: Insufficient documentation

## 2022-05-08 DIAGNOSIS — E785 Hyperlipidemia, unspecified: Secondary | ICD-10-CM | POA: Insufficient documentation

## 2022-05-09 ENCOUNTER — Encounter: Payer: Self-pay | Admitting: Cardiology

## 2022-05-09 ENCOUNTER — Ambulatory Visit: Payer: Medicare Other | Attending: Cardiology | Admitting: Cardiology

## 2022-05-09 VITALS — BP 142/76 | HR 76 | Ht 59.0 in | Wt 175.4 lb

## 2022-05-09 DIAGNOSIS — Z01810 Encounter for preprocedural cardiovascular examination: Secondary | ICD-10-CM | POA: Diagnosis not present

## 2022-05-09 DIAGNOSIS — Z6835 Body mass index (BMI) 35.0-35.9, adult: Secondary | ICD-10-CM | POA: Diagnosis not present

## 2022-05-09 DIAGNOSIS — F419 Anxiety disorder, unspecified: Secondary | ICD-10-CM | POA: Diagnosis not present

## 2022-05-09 DIAGNOSIS — G4700 Insomnia, unspecified: Secondary | ICD-10-CM | POA: Diagnosis not present

## 2022-05-09 DIAGNOSIS — Z940 Kidney transplant status: Secondary | ICD-10-CM | POA: Diagnosis not present

## 2022-05-09 DIAGNOSIS — R079 Chest pain, unspecified: Secondary | ICD-10-CM | POA: Diagnosis not present

## 2022-05-09 DIAGNOSIS — E782 Mixed hyperlipidemia: Secondary | ICD-10-CM | POA: Insufficient documentation

## 2022-05-09 DIAGNOSIS — N186 End stage renal disease: Secondary | ICD-10-CM | POA: Diagnosis not present

## 2022-05-09 DIAGNOSIS — E7800 Pure hypercholesterolemia, unspecified: Secondary | ICD-10-CM | POA: Diagnosis not present

## 2022-05-09 DIAGNOSIS — N184 Chronic kidney disease, stage 4 (severe): Secondary | ICD-10-CM | POA: Diagnosis not present

## 2022-05-09 DIAGNOSIS — I10 Essential (primary) hypertension: Secondary | ICD-10-CM | POA: Diagnosis not present

## 2022-05-09 HISTORY — DX: Encounter for preprocedural cardiovascular examination: Z01.810

## 2022-05-09 HISTORY — DX: Chest pain, unspecified: R07.9

## 2022-05-09 MED ORDER — NITROGLYCERIN 0.4 MG SL SUBL: 0.4000 mg | SUBLINGUAL_TABLET | SUBLINGUAL | 6 refills | Status: DC | PRN

## 2022-05-09 NOTE — Patient Instructions (Signed)
Medication Instructions:  Your physician has recommended you make the following change in your medication:   Use nitroglycerin 1 tablet placed under the tongue at the first sign of chest pain or an angina attack. 1 tablet may be used every 5 minutes as needed, for up to 15 minutes. Do not take more than 3 tablets in 15 minutes. If pain persist call 911 or go to the nearest ED.  *If you need a refill on your cardiac medications before your next appointment, please call your pharmacy*   Lab Work: None ordered If you have labs (blood work) drawn today and your tests are completely normal, you will receive your results only by: [facility] (if you have MyChart) OR A paper copy in the mail If you have any lab test that is abnormal or we need to change your treatment, we will call you to review the results.   Testing/Procedures: Your physician has requested that you have a lexiscan myoview. For further information please visit [email or web address]. Please follow instruction sheet, as given.  The test will take approximately 3 to 4 hours to complete; you may bring reading material.  If someone comes with you to your appointment, they will need to remain in the main lobby due to limited space in the testing area. **If you are pregnant or breastfeeding, please notify the nuclear lab prior to your appointment**  How to prepare for your Myocardial Perfusion Test: Do not eat or drink 3 hours prior to your test, except you may have water. Do not consume products containing caffeine (regular or decaffeinated) 12 hours prior to your test. (ex: coffee, chocolate, sodas, tea). Do bring a list of your current medications with you.  If not listed below, you may take your medications as normal. Do wear comfortable clothes (no dresses or overalls) and walking shoes, tennis shoes preferred (No heels or open toe shoes are allowed). Do NOT wear cologne, perfume, aftershave, or lotions (deodorant is  allowed). If these instructions are not followed, your test will have to be rescheduled.    Follow-Up: At [facility], you and your health needs are our priority.  As part of our continuing mission to provide you with exceptional heart care, we have created designated Provider Care Teams.  These Care Teams include your primary Cardiologist (physician) and Advanced Practice Providers (APPs -  Physician Assistants and Nurse Practitioners) who all work together to provide you with the care you need, when you need it.  We recommend signing up for the patient portal called "MyChart".  Sign up information is provided on this After Visit Summary.  MyChart is used to connect with patients for Virtual Visits (Telemedicine).  Patients are able to view lab/test results, encounter notes, upcoming appointments, etc.  Non-urgent messages can be sent to your provider as well.   To learn more about what you can do with MyChart, go to [email or web address].    Your next appointment:   12 month(s)  The format for your next appointment:   In Person  Provider:   Jyl Heinz, MD   Other Instructions Cardiac Nuclear Scan A cardiac nuclear scan is a test that is done to check the flow of blood to your heart. It is done when you are resting and when you are exercising. The test looks for problems such as: Not enough blood reaching a portion of the heart. The heart muscle not working as it should. You may need this test if: You have heart disease. You have had  lab results that are not normal. You have had heart surgery or a balloon procedure to open up blocked arteries (angioplasty). You have chest pain. You have shortness of breath. In this test, a special dye (tracer) is put into your bloodstream. The tracer will travel to your heart. A camera will then take pictures of your heart to see how the tracer moves through your heart. This test is usually done at a hospital and takes 2-4 hours. Tell a  doctor about: Any allergies you have. All medicines you are taking, including vitamins, herbs, eye drops, creams, and over-the-counter medicines. Any problems you or family members have had with anesthetic medicines. Any blood disorders you have. Any surgeries you have had. Any medical conditions you have. Whether you are pregnant or may be pregnant. What are the risks? Generally, this is a safe test. However, problems may occur, such as: Serious chest pain and heart attack. This is only a risk if the stress portion of the test is done. Rapid heartbeat. A feeling of warmth in your chest. This feeling usually does not last long. Allergic reaction to the tracer. What happens before the test? Ask your doctor about changing or stopping your normal medicines. This is important. Follow instructions from your doctor about what you cannot eat or drink. Remove your jewelry on the day of the test. What happens during the test? An IV tube will be inserted into one of your veins. Your doctor will give you a small amount of tracer through the IV tube. You will wait for 20-40 minutes while the tracer moves through your bloodstream. Your heart will be monitored with an electrocardiogram (ECG). You will lie down on an exam table. Pictures of your heart will be taken for about 15-20 minutes. You may also have a stress test. For this test, one of these things may be done: You will be asked to exercise on a treadmill or a stationary bike. You will be given medicines that will make your heart work harder. This is done if you are unable to exercise. When blood flow to your heart has peaked, a tracer will again be given through the IV tube. After 20-40 minutes, you will get back on the exam table. More pictures will be taken of your heart. Depending on the tracer that is used, more pictures may need to be taken 3-4 hours later. Your IV tube will be removed when the test is over. The test may vary among  doctors and hospitals. What happens after the test? Ask your doctor: Whether you can return to your normal schedule, including diet, activities, and medicines. Whether you should drink more fluids. This will help to remove the tracer from your body. Drink enough fluid to keep your pee (urine) pale yellow. Ask your doctor, or the department that is doing the test: When will my results be ready? How will I get my results? Summary A cardiac nuclear scan is a test that is done to check the flow of blood to your heart. Tell your doctor whether you are pregnant or may be pregnant. Before the test, ask your doctor about changing or stopping your normal medicines. This is important. Ask your doctor whether you can return to your normal activities. You may be asked to drink more fluids. This information is not intended to replace advice given to you by your health care provider. Make sure you discuss any questions you have with your health care provider. Document Revised: 12/10/2018 Document Reviewed: 02/03/2018 Elsevier  Patient Education  2021 [place].   Nitroglycerin Sublingual Tablets What is this medication? NITROGLYCERIN (nye troe GLI ser in) prevents and treats chest pain (angina). It works by relaxing blood vessels, which decreases the amount of work the heart has to do. It belongs to a group of medications called nitrates. This medicine may be used for other purposes; ask your health care provider or pharmacist if you have questions. COMMON BRAND NAME(S): Nitroquick, Nitrostat, Nitrotab What should I tell my care team before I take this medication? They need to know if you have any of these conditions: Anemia Head injury, recent stroke, or bleeding in the brain Liver disease Previous heart attack An unusual or allergic reaction to nitroglycerin, other medications, foods, dyes, or preservatives Pregnant or trying to get pregnant Breast-feeding How should I use this  medication? Take this medication by mouth as needed. Use at the first sign of an angina attack (chest pain or tightness). You can also take this medication 5 to 10 minutes before an event likely to produce chest pain. Follow the directions exactly as written on the prescription label. Place one tablet under your tongue and let it dissolve. Do not swallow whole. Replace the dose if you accidentally swallow it. It will help if your mouth is not dry. Saliva around the tablet will help it to dissolve more quickly. Do not eat or drink, smoke or chew tobacco while a tablet is dissolving. Sit down when taking this medication. In an angina attack, you should feel better within 5 minutes after your first dose. You can take a dose every 5 minutes up to a total of 3 doses. If you do not feel better or feel worse after 1 dose, call 9-1-1 at once. Do not take more than 3 doses in 15 minutes. Your care team might give you other directions. Follow those directions if they do. Do not take your medication more often than directed. Talk to your care team about the use of this medication in children. Special care may be needed. Overdosage: If you think you have taken too much of this medicine contact a poison control center or emergency room at once. NOTE: This medicine is only for you. Do not share this medicine with others. What if I miss a dose? This does not apply. This medication is only used as needed. What may interact with this medication? Do not take this medication with any of the following: Certain migraine medications like ergotamine and dihydroergotamine (DHE) Medications used to treat erectile dysfunction like sildenafil, tadalafil, and vardenafil Riociguat This medication may also interact with the following: Alteplase Aspirin Heparin Medications for high blood pressure Medications for mental depression Other medications used to treat angina Phenothiazines like chlorpromazine, mesoridazine,  prochlorperazine, thioridazine This list may not describe all possible interactions. Give your health care provider a list of all the medicines, herbs, non-prescription drugs, or dietary supplements you use. Also tell them if you smoke, drink alcohol, or use illegal drugs. Some items may interact with your medicine. What should I watch for while using this medication? Tell your care team if you feel your medication is no longer working. Keep this medication with you at all times. Sit or lie down when you take your medication to prevent falling if you feel dizzy or faint after using it. Try to remain calm. This will help you to feel better faster. If you feel dizzy, take several deep breaths and lie down with your feet propped up, or bend forward  with your head resting between your knees. You may get drowsy or dizzy. Do not drive, use machinery, or do anything that needs mental alertness until you know how this medication affects you. Do not stand or sit up quickly, especially if you are an older patient. This reduces the risk of dizzy or fainting spells. Alcohol can make you more drowsy and dizzy. Avoid alcoholic drinks. Do not treat yourself for coughs, colds, or pain while you are taking this medication without asking your care team for advice. Some ingredients may increase your blood pressure. What side effects may I notice from receiving this medication? Side effects that you should report to your care team as soon as possible: Allergic reactions--skin rash, itching, hives, swelling of the face, lips, tongue, or throat Headache, unusual weakness or fatigue, shortness of breath, nausea, vomiting, rapid heartbeat, blue skin or lips, which may be signs of methemoglobinemia Increased pressure around the brain--severe headache, blurry vision, change in vision, nausea, vomiting Low blood pressure--dizziness, feeling faint or lightheaded, blurry vision Slow heartbeat--dizziness, feeling faint or  lightheaded, confusion, trouble breathing, unusual weakness or fatigue Worsening chest pain (angina)--pain, pressure, or tightness in the chest, neck, back, or arms Side effects that usually do not require medical attention (report to your care team if they continue or are bothersome): Dizziness Flushing Headache This list may not describe all possible side effects. Call your doctor for medical advice about side effects. You may report side effects to FDA at 1-800-FDA-1088. Where should I keep my medication? Keep out of the reach of children. Store at room temperature between 20 and 25 degrees C (68 and 77 degrees F). Store in [facility] [employer]. Protect from light and moisture. Keep tightly closed. Throw away any unused medication after the expiration date. NOTE: This sheet is a summary. It may not cover all possible information. If you have questions about this medicine, talk to your doctor, pharmacist, or health care provider.  2023 Elsevier/Gold Standard (2007-10-11 00:00:00)

## 2022-05-09 NOTE — Progress Notes (Signed)
Cardiology Office Note:    Date:  05/09/2022   ID:  Harding, DOB [birth year], MRN [id number]  PCP:  [name], PA-C  Cardiologist:  Jenean Lindau, MD   Referring MD: [name], PA-C    ASSESSMENT:    1. Mixed hyperlipidemia   2. Essential hypertension   3. End stage renal disease ([place])   4. Essential hypertension with goal blood pressure less than 130/80   5. Hypercholesterolemia   6. History of renal transplant   7. Preop cardiovascular exam   8. Chest pain of uncertain etiology    PLAN:    In order of problems listed above:  Primary prevention stressed with the patient.  Importance of compliance with diet medication stressed and she vocalized understanding. Chest pain: Atypical in nature but in view of risk factors and the fact that she plans to undergo fistula procedure we will do a Lexiscan sestamibi.  If this is negative then she is not at high risk for coronary events during the aforementioned surgery.  Meticulous hemodynamic monitoring will further reduce the risk of coronary events.  Sublingual nitroglycerin prescription was sent, its protocol and 911 protocol explained and the patient vocalized understanding questions were answered to the patient's satisfaction Essential hypertension: Blood pressure stable and diet was emphasized. Hyper lipidemia and obesity: Weight reduction stressed diet emphasized.  Lipids followed by primary care provider. End-stage renal disease: Managed by nephrologist. Patient will be seen in follow-up appointment in 6 months or earlier if the patient has any concerns    Medication Adjustments/Labs and Tests Ordered: Current medicines are reviewed at length with the patient today.  Concerns regarding medicines are outlined above.  Orders Placed This Encounter  Procedures   MYOCARDIAL PERFUSION IMAGING   EKG 12-Lead   Meds ordered this encounter  Medications   nitroGLYCERIN (NITROSTAT) 0.4 MG SL tablet    Sig: Place  1 tablet (0.4 mg total) under the tongue every 5 (five) minutes as needed.    Dispense:  25 tablet    Refill:  6     No chief complaint on file.    History of Present Illness:    Ariel [name] is a 46 y.o. female.  Patient has past medical history of essential hypertension, mixed dyslipidemia, post renal transplant which has failed.  She has end-stage renal disease.  A fistula implantation has been planned.  She gives history of chest pain at times.  No radiation to the neck or to the arms.  This happens when she is resting.  She walked around her parents home yesterday for about 10 to 15 minutes without any symptoms.  At the time of my evaluation, the patient is alert awake oriented and in no distress.  Past Medical History:  Diagnosis Date   Abdominal distension (gaseous) 04/16/2018   Abdominal pain 02/14/2022   Abnormal CT scan, gastrointestinal tract    AIN grade II    AIN grade III 04/05/2020   AKI (acute kidney injury) ([place]) 08/18/2021   Anemia    Anemia associated with chronic renal failure    Anorectal disorder 06/08/2020   Anorectal pain 06/08/2020   Anxiety disorder 10/29/2018   Back pain    Bloating 04/16/2018   Cancer ([place])    pre cervical   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    of the [place]   Chronic kidney disease, stage V ([place]) 11/08/486   Complications due to cardiac device, implant, and graft 09/02/2012  Condyloma of female genitalia    Dehydration 01/19/2019   Depression    Dialysis patient [facility])    Drug-induced bleeding disorder (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   Fatigue    Gastritis, acute    GERD without esophagitis 12/20/2021   Gout    History of parathyroidectomy ([place]) 10/29/2018   History of renal transplant    HTN (hypertension) 01/19/2019   Hypercholesterolemia    Hyperlipidemia    Hyperphosphatemia 12/20/2021   Hypokalemia    Hypomagnesemia    Hypothyroid 01/19/2019   IBS (irritable bowel syndrome)    Immunosuppressive management encounter following kidney transplant 10/29/2018   Internal hemorrhoid 04/22/2018   Iron deficiency anemia 10/19/2015   Irregular uterine bleeding 02/13/2016   Kidney disorder 06/08/2020   Kidney transplant recipient 10/29/2018   1st transplant [facility]) was 1995- 2001.  Did HD/ PD mix from 2001- 2008. 2nd transplant ([facility]/ [facility]) was in 2008 and is still working as of 12/2021. F/b Dr Hollie Salk ([place]) and [facility] [place] transplant [facility].   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   Obesity, Class I, BMI 30-34.9 12/21/2021   Other chronic sinusitis 10/29/2018   Palpitations    Pancreatic duct dilated 02/15/2022   Pancreatitis    Perianal lesion 04/22/2018   Phlebitis and thrombophlebitis of other sites 08/02/2012   Plantar fasciitis 07/03/2018   Pre-transplant evaluation for kidney transplant 01/09/2022   Pruritus ani 06/26/2018   Renal failure, chronic 11/03/2021   Dialysis in the past   Right lower quadrant abdominal pain 10/29/2018   [facility] spotted fever    S/p cadaver renal transplant 08/02/2012   Secondary hyperparathyroidism of renal origin [facility])    Secondary hypertension due to renal disease 10/29/2018   Sesamoiditis 07/03/2018   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    Transplanted organ removal  status 10/29/2018   Unstable angina ([place]) 01/19/2019   Urinary tract infection    Vulvar dystrophy    Yeast dermatitis 08/14/2018    Past Surgical History:  Procedure Laterality Date   AV FISTULA PLACEMENT     COLONOSCOPY  11/28/2005   [facility]   COLONOSCOPY  04/13/2016   Dr Orlena Sheldon   ESOPHAGOGASTRODUODENOSCOPY  07/31/2017   Distal esophageal ulcers (likely due to gastroeesphageal reflix-biopsied). Small hiatal hernia. Erosive gastritis.   HEMORRHOID SURGERY     2019, and 04/29/2020   INSERTION OF DIALYSIS CATHETER     KIDNEY TRANSPLANT  1995, 2008   KNEE SURGERY Left 2020   PARATHYROIDECTOMY  2004   Portion on the parathyroid gland transplanted in R forearm   REMOVAL OF A DIALYSIS CATHETER     SIMPLE VULVECTOMY  06/19/2011   Partial simple left   SKIN CANCER EXCISION  2016   Per pt, area  removed on scalp showed squamous cell carcinoma   TUBAL LIGATION     UPPER GASTROINTESTINAL ENDOSCOPY      Current Medications: Current Meds  Medication Sig   acetaminophen (TYLENOL) 500 MG tablet Take 500 mg by mouth every 6 (six) hours as needed for headache (pain).   allopurinol (ZYLOPRIM) 100 MG tablet Take 100 mg by mouth daily.   ALPRAZolam (XANAX) 0.5 MG tablet Take 0.5 mg by mouth at bedtime.    aspirin-acetaminophen-caffeine (EXCEDRIN MIGRAINE) 250-250-65 MG tablet Take 1 tablet by mouth every 6 (six) hours as needed for headache.   carvedilol (COREG) 25 MG tablet Take 25 mg by mouth 2 (two) times daily with a meal.   famotidine (PEPCID) 20 MG tablet Take 1 tablet (20 mg total) by mouth daily.   fluticasone (FLONASE) 50 MCG/ACT nasal spray Place 2 sprays into both nostrils daily.   furosemide (LASIX) 40 MG tablet Take 40 mg by mouth daily as needed for fluid or edema.    gabapentin (NEURONTIN) 100 MG capsule Take 100 mg by mouth 2 (two) times daily.   hydrocortisone (ANUSOL-HC) 2.5 % rectal cream HC 2.5% BID x 10 days perianal area   hydrOXYzine (ATARAX) 25 MG tablet Take 25-50  mg by mouth 2 (two) times daily as needed for dizziness or anxiety.   isosorbide mononitrate (IMDUR) 30 MG 24 hr tablet Take 1 tablet (30 mg total) by mouth every morning.   magnesium gluconate (MAGONATE) 500 MG tablet Take 500 mg by mouth every evening.   medroxyPROGESTERone (PROVERA) 10 MG tablet Take 10 mg by mouth daily.   mycophenolate (MYFORTIC) 360 MG TBEC EC tablet Take 360 mg by mouth 2 (two) times daily.   nitroGLYCERIN (NITROSTAT) 0.4 MG SL tablet Place 1 tablet (0.4 mg total) under the tongue every 5 (five) minutes as needed.   ondansetron (ZOFRAN-ODT) 4 MG disintegrating tablet Take 4 mg by mouth as needed for nausea/vomiting.   pantoprazole (PROTONIX) 40 MG tablet Take 1 tablet (40 mg total) by mouth daily.   potassium chloride SA (KLOR-CON M) 20 MEQ tablet Take 20 mEq by mouth daily.   predniSONE (DELTASONE) 10 MG tablet Take 10 mg by mouth daily.   sertraline (ZOLOFT) 50 MG tablet Take 50 mg by mouth daily.   sevelamer carbonate (RENVELA) 800 MG tablet Take 800 mg by mouth 3 (three) times daily with meals.   simvastatin (ZOCOR) 5 MG tablet Take 5 mg by mouth at bedtime.   sodium bicarbonate 650 MG tablet Take 1,300 mg by mouth every evening.   tacrolimus (PROGRAF) 0.5 MG capsule Take 0.5 mg by mouth 2 (two) times daily. Take along with 1 mg capsule=1.5 mg BID   tacrolimus (PROGRAF) 1 MG capsule Take 1 mg by mouth 2 (two) times daily. Take along with 0.5 mg capsule=1.5 mg BID   traZODone (DESYREL) 100 MG tablet Take 100 mg by mouth at bedtime.     Allergies:   Bactrim [sulfamethoxazole-trimethoprim], Sulfamethoxazole, Hydrogen peroxide, Labetalol, Wound dressing adhesive, Zolpidem, Amlodipine, Hyoscyamine, Keflex [cephalexin], Orphenadrine, Adhesive [tape], Imuran [azathioprine sodium], Neosporin [bacitracin-polymyxin b], Tramadol, and Warfarin sodium   Social History   Socioeconomic History   Marital status: Divorced    Spouse name: Not on file   Number of children: Not on  file   Years of education: Not on file   Highest education level: Not on file  Occupational History   Not on file  Tobacco Use   Smoking status: Never   Smokeless tobacco: Never  Vaping Use   Vaping Use: Never used  Substance and Sexual Activity   Alcohol use: No   Drug use: No   Sexual activity: Not on file  Other Topics Concern   Not on file  Social History Narrative   Not on file   Social Determinants of Health   Financial Resource Strain: Not on file  Food Insecurity: Not on file  Transportation Needs: Not on file  Physical Activity: Not on file  Stress: Not on file  Social Connections: Not on file     Family History: The patient's family history includes Colon cancer in her maternal grandmother; Diabetes in her father; Hyperlipidemia in her mother; Hypertension in her mother; Rectal cancer in her maternal grandmother. There is no history of Esophageal cancer or Stomach cancer.  ROS:   Please see the history of present illness.    All other systems reviewed and are negative.  EKGs/Labs/Other Studies Reviewed:    The following studies were reviewed today: EKG reveals sinus rhythm and nonspecific ST-T changes.   Recent Labs: 02/17/2022: ALT 9; BUN 44; Creatinine, Ser 3.90; Hemoglobin 9.9; Magnesium 1.9; Platelets 196; Potassium 3.9; Sodium 138  Recent Lipid Panel No results found for: "CHOL", "TRIG", "HDL", "CHOLHDL", "VLDL", "LDLCALC", "LDLDIRECT"  Physical Exam:    VS:  BP (!) 142/76   Pulse 76   Ht '4\' 11"'$  (1.499 m)   Wt 175 lb 6.4 oz (79.6 kg)   SpO2 97%   BMI 35.43 kg/m     Wt Readings from Last 3 Encounters:  05/09/22 175 lb 6.4 oz (79.6 kg)  03/23/22 173 lb (78.5 kg)  02/28/22 169 lb (76.7 kg)     GEN: Patient is in no acute distress HEENT: Normal NECK: No JVD; No carotid bruits LYMPHATICS: No lymphadenopathy CARDIAC: Hear sounds regular, 2/6 systolic murmur at the apex. RESPIRATORY:  Clear to auscultation without rales, wheezing or rhonchi   ABDOMEN: Soft, non-tender, non-distended MUSCULOSKELETAL:  No edema; No deformity  SKIN: Warm and dry NEUROLOGIC:  Alert and oriented x 3 PSYCHIATRIC:  Normal affect   Signed, Jenean Lindau, MD  05/09/2022 8:59 AM    [facility]

## 2022-05-10 ENCOUNTER — Telehealth (HOSPITAL_COMMUNITY): Payer: Self-pay | Admitting: *Deleted

## 2022-05-10 NOTE — Telephone Encounter (Signed)
Patient given detailed instructions per Myocardial Perfusion Study Information Sheet for the test on 05/15/2022 at 11:15. Patient notified to arrive 15 minutes early and that it is imperative to arrive on time for appointment to keep from having the test rescheduled.  If you need to cancel or reschedule your appointment, please call the office within 24 hours of your appointment. . Patient verbalized understanding.Ariel [name]

## 2022-05-11 ENCOUNTER — Ambulatory Visit
Admission: RE | Admit: 2022-05-11 | Discharge: 2022-05-11 | Disposition: A | Discharge status: Home / Self Care | Payer: Medicare Other | Source: Ambulatory Visit | Attending: Surgery | Admitting: Surgery

## 2022-05-11 DIAGNOSIS — E213 Hyperparathyroidism, unspecified: Secondary | ICD-10-CM | POA: Diagnosis not present

## 2022-05-11 DIAGNOSIS — E041 Nontoxic single thyroid nodule: Secondary | ICD-10-CM | POA: Diagnosis not present

## 2022-05-11 DIAGNOSIS — N2581 Secondary hyperparathyroidism of renal origin: Secondary | ICD-10-CM

## 2022-05-14 DIAGNOSIS — H4710 Unspecified papilledema: Secondary | ICD-10-CM | POA: Diagnosis not present

## 2022-05-15 ENCOUNTER — Ambulatory Visit: Payer: Medicare Other | Attending: Cardiology

## 2022-05-15 ENCOUNTER — Telehealth: Payer: Self-pay

## 2022-05-15 VITALS — Ht 59.0 in | Wt 175.0 lb

## 2022-05-15 DIAGNOSIS — Z01810 Encounter for preprocedural cardiovascular examination: Secondary | ICD-10-CM | POA: Diagnosis not present

## 2022-05-15 DIAGNOSIS — R079 Chest pain, unspecified: Secondary | ICD-10-CM | POA: Diagnosis not present

## 2022-05-15 LAB — MYOCARDIAL PERFUSION IMAGING
LV dias vol: 67 mL (ref 46–106)
LV sys vol: 24 mL
Nuc Stress EF: 64 %
Peak HR: 105 {beats}/min
Rest HR: 73 {beats}/min
Rest Nuclear Isotope Dose: 10.4 mCi
SDS: 2
SRS: 1
SSS: 3
Stress Nuclear Isotope Dose: 30.6 mCi
TID: 1.14

## 2022-05-15 MED ORDER — TECHNETIUM TC 99M TETROFOSMIN IV KIT
10.4000 | PACK | Freq: Once | INTRAVENOUS | Status: AC | PRN
  Administered 2022-05-15: 10.4 via INTRAVENOUS

## 2022-05-15 MED ORDER — REGADENOSON 0.4 MG/5ML IV SOLN
0.4000 mg | Freq: Once | INTRAVENOUS | Status: AC
  Administered 2022-05-15: 0.4 mg via INTRAVENOUS

## 2022-05-15 MED ORDER — TECHNETIUM TC 99M TETROFOSMIN IV KIT
30.6000 | PACK | Freq: Once | INTRAVENOUS | Status: AC | PRN
  Administered 2022-05-15: 30.6 via INTRAVENOUS

## 2022-05-15 MED ORDER — AMINOPHYLLINE 25 MG/ML IV SOLN
75.0000 mg | Freq: Once | INTRAVENOUS | Status: AC
  Administered 2022-05-15: 75 mg via INTRAVENOUS

## 2022-05-15 NOTE — Telephone Encounter (Signed)
Left message to call back  

## 2022-05-15 NOTE — Telephone Encounter (Signed)
-----   Message from Jenean Lindau, MD sent at 05/15/2022  3:30 PM EDT ----- Her stress test was fine but I see unusual activity increased right lung field activity.  Best would be to get CT without contrast just to be sure.  Copy primary care Jenean Lindau, MD 05/15/2022 3:30 PM

## 2022-05-16 ENCOUNTER — Encounter (HOSPITAL_COMMUNITY): Payer: Self-pay | Admitting: Vascular Surgery

## 2022-05-16 ENCOUNTER — Other Ambulatory Visit: Payer: Self-pay

## 2022-05-16 NOTE — Anesthesia Preprocedure Evaluation (Addendum)
Anesthesia Evaluation  Patient identified by MRN, date of birth, ID band Patient awake    Reviewed: Allergy & Precautions, NPO status , Patient's Chart, lab work & pertinent test results  Airway Mallampati: III  TM Distance: >3 FB Neck ROM: Full    Dental  (+) Dental Advisory Given   Pulmonary neg pulmonary ROS,    Pulmonary exam normal breath sounds clear to auscultation       Cardiovascular hypertension, Pt. on medications + angina + Valvular Problems/Murmurs  Rhythm:Regular Rate:Normal  Stress MPS 05/15/2022 .  The study is normal. The study is low risk. .  Left ventricular function is normal. Nuclear stress EF: 64 %. The left ventricular ejection fraction is normal (55-65%). End diastolic cavity size is normal. .  Increased uptake of isotope seen in the mid right lung field.  Clinical correlation suggested   Echo 12/2020 1. Left ventricular ejection fraction, by estimation, is 60 to 65%. The left ventricle has normal function. The left ventricle has no regional wall motion abnormalities. There is severe concentric left ventricular hypertrophy. Left ventricular diastolicparameters are consistent with Grade I diastolic dysfunction (impaired relaxation). The average left ventricular global longitudinal strain is -9.3 %. The global longitudinal strain is abnormal.  2. Right ventricular systolic function is normal. The right ventricular size is normal. There is normal pulmonary artery systolic pressure.  3. The mitral valve is degenerative. No evidence of mitral valve regurgitation. No evidence of mitral stenosis.  4. The aortic valve is tricuspid. Aortic valve regurgitation is not visualized. No aortic stenosis is present.  5. The inferior vena cava is normal in size with greater than 50% respiratory variability, suggesting right atrial pressure of 3 mmHg.    Neuro/Psych PSYCHIATRIC DISORDERS Anxiety Depression negative  neurological ROS     GI/Hepatic Neg liver ROS, GERD  ,  Endo/Other  Hypothyroidism   Renal/GU CRF, Renal Insufficiency and ESRFRenal disease     Musculoskeletal negative musculoskeletal ROS (+)   Abdominal (+) + obese,   Peds  Hematology  (+) Blood dyscrasia, anemia ,   Anesthesia Other Findings   Reproductive/Obstetrics                            Anesthesia Physical Anesthesia Plan  ASA: 3  Anesthesia Plan: Regional   Post-op Pain Management: Regional block* and Tylenol PO (pre-op)*   Induction: Intravenous  PONV Risk Score and Plan: 2 and Ondansetron, Dexamethasone, Propofol infusion and Midazolam  Airway Management Planned: Natural Airway  Additional Equipment: None  Intra-op Plan:   Post-operative Plan:   Informed Consent: I have reviewed the patients History and Physical, chart, labs and discussed the procedure including the risks, benefits and alternatives for the proposed anesthesia with the patient or authorized representative who has indicated his/her understanding and acceptance.     Dental advisory given  Plan Discussed with: CRNA  Anesthesia Plan Comments:        Anesthesia Quick Evaluation

## 2022-05-16 NOTE — Progress Notes (Signed)
PCP - Dr. Odis Hollingshead  Cardiologist - Dr. Geraldo Pitter  EP- Denies  Endocrine- Denies  Pulm- Denies  Chest x-ray - 02/14/22 (E)  EKG - 05/09/22 (E)  Stress Test - 05/15/22 (E)  ECHO - 12/21/20 (E)  Cardiac Cath - Denies  AICD-na PM-na LOOP-na  Nerve Stimulator- Denies  Dialysis- Not started yet  Sleep Study - Denies CPAP - Denies  LABS- 05/17/22: I-Stat 8  ASA- Denies  ERAS- No  HA1C- Denies  Anesthesia- Yes- medical history  Pt denies having chest pain, sob, or fever during the pre-op phone call. All instructions explained to the pt, with a verbal understanding of the material including: as of today, stop taking all Aspirin (unless instructed by your doctor) and Other Aspirin containing products, Vitamins, Fish oils, and Herbal medications. Also stop all NSAIDS i.e. Advil, Ibuprofen, Motrin, Aleve, Anaprox, Naproxen, BC, Goody Powders, and all Supplements. Pt also instructed to wear a mask and social distance if she goes out. The opportunity to ask questions was provided.

## 2022-05-17 ENCOUNTER — Ambulatory Visit (HOSPITAL_COMMUNITY): Payer: Medicare Other | Admitting: Physician Assistant

## 2022-05-17 ENCOUNTER — Other Ambulatory Visit: Payer: Self-pay

## 2022-05-17 ENCOUNTER — Encounter (HOSPITAL_COMMUNITY)
Admission: RE | Disposition: A | Discharge status: Home / Self Care | Payer: Self-pay | Source: Ambulatory Visit | Attending: Vascular Surgery

## 2022-05-17 ENCOUNTER — Ambulatory Visit (HOSPITAL_BASED_OUTPATIENT_CLINIC_OR_DEPARTMENT_OTHER): Payer: Medicare Other | Admitting: Physician Assistant

## 2022-05-17 ENCOUNTER — Ambulatory Visit (HOSPITAL_COMMUNITY)
Admission: RE | Admit: 2022-05-17 | Discharge: 2022-05-17 | Disposition: A | Discharge status: Home / Self Care | Payer: Medicare Other | Source: Ambulatory Visit | Attending: Vascular Surgery | Admitting: Vascular Surgery

## 2022-05-17 DIAGNOSIS — I120 Hypertensive chronic kidney disease with stage 5 chronic kidney disease or end stage renal disease: Secondary | ICD-10-CM

## 2022-05-17 DIAGNOSIS — N186 End stage renal disease: Secondary | ICD-10-CM | POA: Diagnosis not present

## 2022-05-17 DIAGNOSIS — Z940 Kidney transplant status: Secondary | ICD-10-CM | POA: Insufficient documentation

## 2022-05-17 DIAGNOSIS — D631 Anemia in chronic kidney disease: Secondary | ICD-10-CM | POA: Diagnosis not present

## 2022-05-17 DIAGNOSIS — Z992 Dependence on renal dialysis: Secondary | ICD-10-CM | POA: Insufficient documentation

## 2022-05-17 DIAGNOSIS — Z79899 Other long term (current) drug therapy: Secondary | ICD-10-CM | POA: Diagnosis not present

## 2022-05-17 DIAGNOSIS — N185 Chronic kidney disease, stage 5: Secondary | ICD-10-CM | POA: Diagnosis not present

## 2022-05-17 DIAGNOSIS — E039 Hypothyroidism, unspecified: Secondary | ICD-10-CM | POA: Diagnosis not present

## 2022-05-17 DIAGNOSIS — F418 Other specified anxiety disorders: Secondary | ICD-10-CM

## 2022-05-17 DIAGNOSIS — N251 Nephrogenic diabetes insipidus: Secondary | ICD-10-CM | POA: Insufficient documentation

## 2022-05-17 HISTORY — PX: AV FISTULA PLACEMENT: SHX1204

## 2022-05-17 LAB — POCT I-STAT, CHEM 8
BUN: 51 mg/dL — ABNORMAL HIGH (ref 6–20)
Calcium, Ion: 0.8 mmol/L — CL (ref 1.15–1.40)
Chloride: 106 mmol/L (ref 98–111)
Creatinine, Ser: 4.7 mg/dL — ABNORMAL HIGH (ref 0.44–1.00)
Glucose, Bld: 97 mg/dL (ref 70–99)
HCT: 27 % — ABNORMAL LOW (ref 36.0–46.0)
Hemoglobin: 9.2 g/dL — ABNORMAL LOW (ref 12.0–15.0)
Potassium: 4.6 mmol/L (ref 3.5–5.1)
Sodium: 141 mmol/L (ref 135–145)
TCO2: 23 mmol/L (ref 22–32)

## 2022-05-17 LAB — GLUCOSE, CAPILLARY: Glucose-Capillary: 164 mg/dL — ABNORMAL HIGH (ref 70–99)

## 2022-05-17 SURGERY — ARTERIOVENOUS (AV) FISTULA CREATION
Anesthesia: Regional | Site: Arm Upper | Laterality: Right

## 2022-05-17 MED ORDER — FENTANYL CITRATE (PF) 250 MCG/5ML IJ SOLN
INTRAMUSCULAR | Status: DC | PRN
  Administered 2022-05-17 (×2): 50 ug via INTRAVENOUS

## 2022-05-17 MED ORDER — FENTANYL CITRATE (PF) 250 MCG/5ML IJ SOLN
INTRAMUSCULAR | Status: AC
  Filled 2022-05-17: qty 5

## 2022-05-17 MED ORDER — FENTANYL CITRATE (PF) 100 MCG/2ML IJ SOLN: 25.0000 ug | INTRAMUSCULAR | Status: DC | PRN

## 2022-05-17 MED ORDER — CHLORHEXIDINE GLUCONATE 0.12 % MT SOLN
OROMUCOSAL | Status: AC
  Administered 2022-05-17: 15 mL via OROMUCOSAL
  Filled 2022-05-17: qty 15

## 2022-05-17 MED ORDER — HEPARIN 6000 UNIT IRRIGATION SOLUTION
Status: DC | PRN
  Administered 2022-05-17: 1

## 2022-05-17 MED ORDER — 0.9 % SODIUM CHLORIDE (POUR BTL) OPTIME
TOPICAL | Status: DC | PRN
  Administered 2022-05-17: 1000 mL

## 2022-05-17 MED ORDER — VANCOMYCIN HCL IN DEXTROSE 1-5 GM/200ML-% IV SOLN
1000.0000 mg | INTRAVENOUS | Status: AC
  Administered 2022-05-17: 1000 mg via INTRAVENOUS

## 2022-05-17 MED ORDER — HEPARIN 6000 UNIT IRRIGATION SOLUTION
Status: AC
  Filled 2022-05-17: qty 500

## 2022-05-17 MED ORDER — VANCOMYCIN HCL IN DEXTROSE 1-5 GM/200ML-% IV SOLN
INTRAVENOUS | Status: AC
  Filled 2022-05-17: qty 200

## 2022-05-17 MED ORDER — CHLORHEXIDINE GLUCONATE 4 % EX LIQD: 60.0000 mL | Freq: Once | CUTANEOUS | Status: DC

## 2022-05-17 MED ORDER — LIDOCAINE-EPINEPHRINE (PF) 2 %-1:200000 IJ SOLN
INTRAMUSCULAR | Status: DC | PRN
  Administered 2022-05-17: 10 mL via PERINEURAL

## 2022-05-17 MED ORDER — SODIUM CHLORIDE 0.9 % IV SOLN: INTRAVENOUS | Status: DC

## 2022-05-17 MED ORDER — PROPOFOL 500 MG/50ML IV EMUL
INTRAVENOUS | Status: DC | PRN
  Administered 2022-05-17: 10 ug/kg/min via INTRAVENOUS

## 2022-05-17 MED ORDER — ACETAMINOPHEN 500 MG PO TABS
1000.0000 mg | ORAL_TABLET | Freq: Once | ORAL | Status: AC
Start: 2022-05-17 — End: 2022-05-17

## 2022-05-17 MED ORDER — HEMOSTATIC AGENTS (NO CHARGE) OPTIME
TOPICAL | Status: DC | PRN
  Administered 2022-05-17: 1 via TOPICAL

## 2022-05-17 MED ORDER — LIDOCAINE HCL (PF) 1 % IJ SOLN
INTRAMUSCULAR | Status: DC | PRN
  Administered 2022-05-17: 10 mL

## 2022-05-17 MED ORDER — ESMOLOL HCL 100 MG/10ML IV SOLN
INTRAVENOUS | Status: DC | PRN
  Administered 2022-05-17 (×2): 30 mg via INTRAVENOUS

## 2022-05-17 MED ORDER — HEPARIN SODIUM (PORCINE) 1000 UNIT/ML IJ SOLN
INTRAMUSCULAR | Status: DC | PRN
  Administered 2022-05-17: 2000 [IU] via INTRAVENOUS

## 2022-05-17 MED ORDER — LIDOCAINE HCL (PF) 1 % IJ SOLN: INTRAMUSCULAR | Status: DC | PRN

## 2022-05-17 MED ORDER — ORAL CARE MOUTH RINSE: 15.0000 mL | Freq: Once | OROMUCOSAL | Status: AC

## 2022-05-17 MED ORDER — OXYCODONE HCL 5 MG/5ML PO SOLN: 5.0000 mg | Freq: Once | ORAL | Status: DC | PRN

## 2022-05-17 MED ORDER — MIDAZOLAM HCL 2 MG/2ML IJ SOLN
INTRAMUSCULAR | Status: DC | PRN
  Administered 2022-05-17 (×2): 1 mg via INTRAVENOUS

## 2022-05-17 MED ORDER — LIDOCAINE HCL (PF) 2 % IJ SOLN
INTRAMUSCULAR | Status: DC | PRN
  Administered 2022-05-17: 100 mg via PERINEURAL

## 2022-05-17 MED ORDER — OXYCODONE-ACETAMINOPHEN 5-325 MG PO TABS: 1.0000 | ORAL_TABLET | Freq: Four times a day (QID) | ORAL | 0 refills | Status: DC | PRN

## 2022-05-17 MED ORDER — PROMETHAZINE HCL 25 MG/ML IJ SOLN: 6.2500 mg | INTRAMUSCULAR | Status: DC | PRN

## 2022-05-17 MED ORDER — MEPERIDINE HCL 25 MG/ML IJ SOLN: 6.2500 mg | INTRAMUSCULAR | Status: DC | PRN

## 2022-05-17 MED ORDER — MIDAZOLAM HCL 2 MG/2ML IJ SOLN
INTRAMUSCULAR | Status: AC
  Filled 2022-05-17: qty 2

## 2022-05-17 MED ORDER — LACTATED RINGERS IV SOLN: INTRAVENOUS | Status: DC

## 2022-05-17 MED ORDER — ACETAMINOPHEN 500 MG PO TABS
ORAL_TABLET | ORAL | Status: AC
  Administered 2022-05-17: 1000 mg via ORAL
  Filled 2022-05-17: qty 2

## 2022-05-17 MED ORDER — PROPOFOL 10 MG/ML IV BOLUS
INTRAVENOUS | Status: AC
  Filled 2022-05-17: qty 20

## 2022-05-17 MED ORDER — ONDANSETRON HCL 4 MG/2ML IJ SOLN
INTRAMUSCULAR | Status: DC | PRN
  Administered 2022-05-17: 4 mg via INTRAVENOUS

## 2022-05-17 MED ORDER — CHLORHEXIDINE GLUCONATE 0.12 % MT SOLN
15.0000 mL | Freq: Once | OROMUCOSAL | Status: AC
  Filled 2022-05-17: qty 15

## 2022-05-17 MED ORDER — LIDOCAINE HCL (PF) 1 % IJ SOLN
INTRAMUSCULAR | Status: AC
  Filled 2022-05-17: qty 30

## 2022-05-17 MED ORDER — ESMOLOL HCL 100 MG/10ML IV SOLN
INTRAVENOUS | Status: AC
  Filled 2022-05-17: qty 10

## 2022-05-17 MED ORDER — OXYCODONE HCL 5 MG PO TABS: 5.0000 mg | ORAL_TABLET | Freq: Once | ORAL | Status: DC | PRN

## 2022-05-17 SURGICAL SUPPLY — 46 items
ADH SKN CLS APL DERMABOND .7 (GAUZE/BANDAGES/DRESSINGS) ×1
ARMBAND PINK RESTRICT EXTREMIT (MISCELLANEOUS) ×1 IMPLANT
BAG COUNTER SPONGE SURGICOUNT (BAG) ×1 IMPLANT
BAG SPNG CNTER NS LX DISP (BAG) ×1
BLADE CLIPPER SURG (BLADE) ×1 IMPLANT
BNDG CMPR STD VLCR NS LF 5.8X3 (GAUZE/BANDAGES/DRESSINGS) ×1
BNDG ELASTIC 3X5.8 VLCR NS LF (GAUZE/BANDAGES/DRESSINGS) IMPLANT
BNDG ELASTIC 4X5.8 VLCR STR LF (GAUZE/BANDAGES/DRESSINGS) ×1 IMPLANT
CANISTER SUCT 3000ML PPV (MISCELLANEOUS) ×1 IMPLANT
CLIP LIGATING EXTRA MED SLVR (CLIP) ×1 IMPLANT
CLIP LIGATING EXTRA SM BLUE (MISCELLANEOUS) ×1 IMPLANT
CLIP VESOCCLUDE MED 6/CT (CLIP) ×1 IMPLANT
COVER PROBE W GEL 5X96 (DRAPES) ×1 IMPLANT
DERMABOND IMPLANT
DERMABOND ADVANCED .7 DNX12 (GAUZE/BANDAGES/DRESSINGS) ×1 IMPLANT
ELECT REM PT RETURN 9FT ADLT (ELECTROSURGICAL) ×1
ELECTRODE REM PT RTRN 9FT ADLT (ELECTROSURGICAL) ×1 IMPLANT
GLOVE BIO SURGEON STRL SZ7 (GLOVE) IMPLANT
GLOVE BIO SURGEON STRL SZ7.5 (GLOVE) ×1 IMPLANT
GLOVE BIOGEL PI IND STRL 7.0 (GLOVE) IMPLANT
GLOVE BIOGEL PI IND STRL 8 (GLOVE) ×1 IMPLANT
GLOVE SRG 8 PF TXTR STRL LF DI (GLOVE) ×1 IMPLANT
GLOVE SURG POLYISO LF SZ8 (GLOVE) IMPLANT
GLOVE SURG UNDER POLY LF SZ8 (GLOVE) ×1
GOWN STRL REUS W/ TWL LRG LVL3 (GOWN DISPOSABLE) ×2 IMPLANT
GOWN STRL REUS W/TWL 2XL LVL3 (GOWN DISPOSABLE) ×2 IMPLANT
GOWN STRL REUS W/TWL LRG LVL3 (GOWN DISPOSABLE) ×2
HEMOSTAT SNOW SURGICEL 2X4 (HEMOSTASIS) IMPLANT
KIT BASIN OR (CUSTOM PROCEDURE TRAY) ×1 IMPLANT
KIT TURNOVER KIT B (KITS) ×1 IMPLANT
NS IRRIG 1000ML POUR BTL (IV SOLUTION) ×1 IMPLANT
PACK CV ACCESS (CUSTOM PROCEDURE TRAY) ×1 IMPLANT
PAD ARMBOARD 7.5X6 YLW CONV (MISCELLANEOUS) ×2 IMPLANT
SLING ARM FOAM STRAP LRG (SOFTGOODS) IMPLANT
SLING ARM FOAM STRAP MED (SOFTGOODS) IMPLANT
SPIKE FLUID TRANSFER (MISCELLANEOUS) ×1 IMPLANT
SUT MNCRL AB 4-0 PS2 18 (SUTURE) ×1 IMPLANT
SUT PROLENE 6 0 BV (SUTURE) ×1 IMPLANT
SUT PROLENE 7 0 BV 1 (SUTURE) IMPLANT
SUT SILK 2 0 SH (SUTURE) ×1 IMPLANT
SUT SILK 3 0 SH CR/8 (SUTURE) ×1 IMPLANT
SUT VIC AB 3-0 SH 27 (SUTURE) ×1
SUT VIC AB 3-0 SH 27X BRD (SUTURE) ×1 IMPLANT
TOWEL GREEN STERILE (TOWEL DISPOSABLE) ×1 IMPLANT
UNDERPAD 30X36 HEAVY ABSORB (UNDERPADS AND DIAPERS) ×1 IMPLANT
WATER STERILE IRR 1000ML POUR (IV SOLUTION) ×1 IMPLANT

## 2022-05-17 NOTE — Anesthesia Postprocedure Evaluation (Signed)
Anesthesia Post Note  Patient: [employer]chant [employer] officer  Procedure(s) Performed: RIGHT ARM BRACHIOCEPHALIC ARTERIOVENOUS (AV) FISTULA CREATION (Right: Arm Upper)     Patient location during evaluation: PACU Anesthesia Type: Regional Level of consciousness: awake and alert Pain management: pain level controlled Vital Signs Assessment: post-procedure vital signs reviewed and stable Respiratory status: spontaneous breathing Cardiovascular status: stable Anesthetic complications: no   No notable events documented.  Last Vitals:  Vitals:   05/17/22 0945 05/17/22 1000  BP: 108/82 (!) 155/66  Pulse: 84 87  Resp: (!) 30 18  Temp:  37.1 C  SpO2: 95% 96%    Last Pain:  Vitals:   05/17/22 1000  TempSrc:   PainSc: 0-No pain                 Nolon Nations

## 2022-05-17 NOTE — Op Note (Signed)
    NAME: [name]    MRN: [id number] DOB: 11-23-1975    DATE OF OPERATION: 05/17/2022  PREOP DIAGNOSIS:    End-stage renal disease  POSTOP DIAGNOSIS:    Same  PROCEDURE:    Right arm brachiocephalic fistula creation  SURGEON: [name]  ASSIST: Luisa Dago, PA  ANESTHESIA: Moderate, block  EBL: 10 mL  INDICATIONS:    Ariel [name] is a 46 y.o. female with end-stage renal disease and history of multiple prior permanent HD access attempts.  She presented to the office with need for new permanent HD access.  Vein mapping demonstrated adequate cephalic and basilic veins in the right upper extremity.  After discussing the risk and benefits of right arm brachiocephalic fistula creation, [name] elected to proceed.  FINDINGS:   5 mm cephalic vein, 3.5 mm brachial artery  TECHNIQUE:   The patient was brought to the operating room and placed in supine position. The right arm was prepped and draped in standard fashion. IV antibiotics were administered. A timeout was performed.   The case began with ultrasound insonation of the brachial artery and cephalic vein, which demonstrated sufficient size at the antecubital fossa for arteriovenous fistula.   A transverse incision was made below the elbow creese in the antecubital fossa. The  cephalic vein was isolated for 3 cm in length. Next the aponeurosis was partially released and the brachial artery secured with a vessel loop. The patient was heparinized. The cephalic vein was transected and ligated distally with a 2-0 silk stick-tie. The vein was dilated with coronary dilators and flushed with heparin saline. Vascular clamps were placed proximally and distally on the brachial artery and a 5 mm arteriotomy was created on the brachial artery. This was flushed with heparin saline.  An anastomosis was created in end to side fashion on the brachial artery using running 6-0 Prolene suture.  Prior to completing the  anastomosis, the vessels were flushed and the suture line was tied down. There was an excellent thrill in the cephalic vein from the anastomosis to the mid upper bicipital region. The patient had a multiphasic radial and ulnar signal. He had an excellent doppler signal in the fistula. The incision was irrigated and hemostasis achieved with cautery and suture. The deeper tissue was closed with 3-0 Vicryl and the skin closed with 4-0 Monocryl.  Dermabond was applied to the incisions. The patient was transferred to PACU in stable condition.  Given the complexity of the case,  the assistant was necessary in order to expedient the procedure and safely perform the technical aspects of the operation.  The assistant provided traction and countertraction to assist with exposure of the artery and vein.  They also assisted with suture ligation of multiple venous branches.  They played a critical role in the anastomosis. These skills, especially following the Prolene suture for the anastomosis, could not have been adequately performed by a scrub tech assistant.    Macie Burows, MD [facility] DATE OF DICTATION:   05/17/2022

## 2022-05-17 NOTE — Discharge Instructions (Signed)
[facility]  Discharge Instructions  AV Fistula or Graft Surgery for Dialysis Access  Please refer to the following instructions for your post-procedure care. Your surgeon or physician assistant will discuss any changes with you.  Activity  You may drive the day following your surgery, if you are comfortable and no longer taking prescription pain medication. Resume full activity as the soreness in your incision resolves.  Bathing/Showering  You may shower after you go home. Keep your incision dry for 48 hours. Do not soak in a bathtub, hot tub, or swim until the incision heals completely. You may not shower if you have a hemodialysis catheter.  Incision Care  Clean your incision with mild soap and water after 48 hours. Pat the area dry with a clean towel. You do not need a bandage unless otherwise instructed. Do not apply any ointments or creams to your incision. You may have skin glue on your incision. Do not peel it off. It will come off on its own in about one week. Your arm may swell a bit after surgery. To reduce swelling use pillows to elevate your arm so it is above your heart. Your doctor will tell you if you need to lightly wrap your arm with an ACE bandage.  Diet  Resume your normal diet. There are not special food restrictions following this procedure. In order to heal from your surgery, it is CRITICAL to get adequate nutrition. Your body requires vitamins, minerals, and protein. Vegetables are the best source of vitamins and minerals. Vegetables also provide the perfect balance of protein. Processed food has little nutritional value, so try to avoid this.  Medications  Resume taking all of your medications. If your incision is causing pain, you may take over-the counter pain relievers such as acetaminophen (Tylenol). If you were prescribed a stronger pain medication, please be aware these medications can cause nausea and constipation. Prevent  nausea by taking the medication with a snack or meal. Avoid constipation by drinking plenty of fluids and eating foods with high amount of fiber, such as fruits, vegetables, and grains.  Do not take Tylenol if you are taking prescription pain medications.  Follow up Your surgeon may want to see you in the office following your access surgery. If so, this will be arranged at the time of your surgery.  Please call us immediately for any of the following conditions:  Increased pain, redness, drainage (pus) from your incision site Fever of 101 degrees or higher Severe or worsening pain at your incision site Hand pain or numbness.  Reduce your risk of vascular disease:  Stop smoking. If you would like help, call [facility] at 1-800-QUIT-NOW (559) 554-0134) or [place] at [place] your cholesterol Maintain a desired weight Control your diabetes Keep your blood pressure down  Dialysis  It will take several weeks to several months for your new dialysis access to be ready for use. Your surgeon will determine when it is okay to use it. Your nephrologist will continue to direct your dialysis. You can continue to use your Permcath until your new access is ready for use.   05/17/2022 Ariel [name] [id number] [birth year]  Surgeon(s): [name], MD  Procedure(s): RIGHT ARM BRACHIOCEPHALIC ARTERIOVENOUS (AV) FISTULA CREATION   May stick graft immediately   May stick graft on designated area only:   x Do not stick fistula for 12 weeks    If you have any questions, please call the office at  [phone].  

## 2022-05-17 NOTE — Anesthesia Procedure Notes (Signed)
Procedure Name: MAC Date/Time: 05/17/2022 7:40 AM  Performed by: Janace Litten, CRNAPre-anesthesia Checklist: Patient identified, Emergency Drugs available, Suction available and Patient being monitored Patient Re-evaluated:Patient Re-evaluated prior to induction Oxygen Delivery Method: Nasal cannula

## 2022-05-17 NOTE — H&P (Signed)
Office Note   Patient seen and examined in preop holding.  No complaints. No changes to medication history or physical exam since last seen in clinic. After discussing the risks and benefits of right arm brachiocephalic fistula creation, Ariel [name] elected to proceed.   Ariel [name]   CC:  ESRD Requesting Provider:  No ref. provider found  HPI: Ariel [name] is a Right handed 46 y.o. (17-Feb-1976) female with kidney disease who presents at the request of No ref. provider found for permanent HD access. The patient has had multiple prior access procedures on the left upper extremity. Per pt, previous tunneled lines have been placed in bilateral IJ's.  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  She is undergone kidney transplant x2 with the first 1 lasting 8 years, second lasting 15 years.  She currently lives in [place] with her mother and lives on disability due to her illness.  On exam today, Ariel [name] was doing well, accompanied by her mother.  Her current transplant has started to fail, necessitating long-term HD access.   Past Medical History:  Diagnosis Date   Abdominal distension (gaseous) 04/16/2018   Abdominal pain 02/14/2022   Abnormal CT scan, gastrointestinal tract    AIN grade II    AIN grade III 04/05/2020   AKI (acute kidney injury) ([place]) 08/18/2021   Anemia    Anemia associated with chronic renal failure    Anorectal disorder 06/08/2020   Anorectal pain 06/08/2020   Anxiety disorder 10/29/2018   Back pain    Bloating 04/16/2018   Cancer ([place])    pre cervical   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    of the [place]   Chronic kidney disease, stage V ([place]) 0/09/270   Complications due to cardiac device, implant, and graft 09/02/2012   Condyloma of female genitalia    Dehydration 01/19/2019   Depression    Dialysis patient [facility])    Drug-induced bleeding disorder ([place])  01/19/2019   DVT of axillary vein, acute left ([place]) 08/02/2012   Dysmenorrhea 10/19/2015   Dyspnea on exertion 01/19/2019   Dysuria    End stage renal disease ([place]) 09/02/2012   ESRD (end stage renal disease) ([place])    sp transplant x 2, did mix of HD and PD from 2001- 2008   Essential hypertension 11/03/2021   Essential hypertension with goal blood pressure less than 130/80    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   Fatigue    Gastritis, acute    GERD without esophagitis 12/20/2021   Gout    History of parathyroidectomy ([place]) 10/29/2018   History of renal transplant    HTN (hypertension) 01/19/2019   Hypercholesterolemia    Hyperlipidemia    Hyperphosphatemia 12/20/2021   Hypokalemia    Hypomagnesemia    Hypothyroid 01/19/2019   IBS (irritable bowel syndrome)    Immunosuppressive management encounter following kidney transplant 10/29/2018   Internal hemorrhoid 04/22/2018   Iron deficiency anemia 10/19/2015   Irregular uterine bleeding 02/13/2016   Kidney disorder 06/08/2020   Kidney transplant recipient 10/29/2018   1st transplant [facility]) was 1995- 2001.  Did HD/ PD mix from 2001- 2008. 2nd transplant ([facility]/ [facility]) was in 2008 and is still working as of 12/2021. F/b Dr Hollie Salk ([place]) and [facility].   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   Obesity, Class I, BMI 30-34.9 12/21/2021   Other chronic sinusitis 10/29/2018   Palpitations    Pancreatic duct dilated 02/15/2022   Pancreatitis    Perianal lesion 04/22/2018   Phlebitis and thrombophlebitis of other sites 08/02/2012   Plantar fasciitis 07/03/2018   Pre-transplant evaluation for kidney transplant 01/09/2022   Pruritus ani 06/26/2018   Renal  failure, chronic 11/03/2021   Dialysis in the past   Right lower quadrant abdominal pain 10/29/2018   [facility] spotted fever    S/p cadaver renal transplant 08/02/2012   Secondary hyperparathyroidism of renal origin [facility])    Secondary hypertension due to renal disease 10/29/2018   Sesamoiditis 07/03/2018   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    Transplanted organ removal status 10/29/2018   Unstable angina ([place]) 01/19/2019   Urinary tract infection    Vulvar dystrophy    Yeast dermatitis 08/14/2018    Past Surgical History:  Procedure Laterality Date   AV FISTULA PLACEMENT     COLONOSCOPY  11/28/2005   [facility]   COLONOSCOPY  04/13/2016   Dr Orlena Sheldon   ESOPHAGOGASTRODUODENOSCOPY  07/31/2017   Distal esophageal ulcers (likely due to gastroeesphageal reflix-biopsied). Small hiatal hernia. Erosive gastritis.   HEMORRHOID SURGERY     2019, and 04/29/2020   INSERTION OF DIALYSIS CATHETER     KIDNEY TRANSPLANT  1995, 2008   KNEE SURGERY Left 2020   PARATHYROIDECTOMY  2004   Portion on the parathyroid gland transplanted in R forearm   REMOVAL OF A DIALYSIS CATHETER     SIMPLE VULVECTOMY  06/19/2011   Partial simple left   SKIN CANCER EXCISION  2016   Per pt, area removed on scalp showed squamous cell carcinoma   TUBAL LIGATION     UPPER GASTROINTESTINAL ENDOSCOPY      Social History   Socioeconomic History   Marital status: Divorced    Spouse name: Not on file   Number of children: Not on file   Years of education: Not on file   Highest education level: Not on file  Occupational History   Not on file  Tobacco Use   Smoking status: Never   Smokeless tobacco: Never  Vaping Use   Vaping Use: Never used  Substance and Sexual Activity   Alcohol use: No   Drug use: No   Sexual activity: Not on file  Other Topics Concern   Not on file  Social History Narrative   Not on file   Social Determinants of Health   Financial Resource Strain: Not on file   Food Insecurity: Not on file  Transportation Needs: Not on file  Physical Activity: Not on file  Stress: Not on file  Social Connections: Not on file  Intimate Partner Violence: Not on file   Family History  Problem Relation Age of Onset   Hypertension Mother    Hyperlipidemia Mother    Diabetes Father    Rectal cancer Maternal Grandmother    Colon cancer Maternal Grandmother    Esophageal cancer Neg Hx    Stomach cancer Neg Hx     Current Facility-Administered Medications  Medication Dose Route Frequency Provider Last Rate Last Admin   0.9 %  sodium chloride infusion   Intravenous Continuous Ariel John, [name]   New Bag  at 05/17/22 6063   chlorhexidine (HIBICLENS) 4 % liquid 4 Application  60 mL Topical Once Ariel John, [name]       And   [START ON 05/18/2022] chlorhexidine (HIBICLENS) 4 % liquid 4 Application  60 mL Topical Once Ariel John, [name]       vancomycin (VANCOCIN) 1-5 GM/200ML-% IVPB            vancomycin (VANCOCIN) IVPB 1000 mg/200 mL premix  1,000 mg Intravenous 60 min Pre-Op Ariel John, [name]       Facility-Administered Medications Ordered in Other Encounters  Medication Dose Route Frequency Provider Last Rate Last Admin   fentaNYL citrate (PF) (SUBLIMAZE) injection   Intravenous Anesthesia Intra-op Hessie Dibble T, CRNA   50 mcg at 05/17/22 0716   midazolam (VERSED) injection   Intravenous Anesthesia Intra-op Hessie Dibble T, CRNA   1 mg at 05/17/22 0160    Allergies  Allergen Reactions   Bactrim [Sulfamethoxazole-Trimethoprim] Anaphylaxis   Sulfamethoxazole Anaphylaxis   Ambien [Zolpidem]     Sleep walking, hallucinations   Hydrogen Peroxide Rash   Labetalol Hives and Rash   Wound Dressing Adhesive Hives   Amlodipine Swelling   Hyoscyamine Other (See Comments)    Dizziness    Keflex [Cephalexin] Hives and Swelling    Per pt, her face and lips swell up and she develops hives   Orphenadrine Hives   Adhesive [Tape] Rash    Can not use for  long periods of time (3 days or more)   Imuran [Azathioprine Sodium] Rash   Neosporin [Bacitracin-Polymyxin B] Rash and Other (See Comments)    Can use that for awhile, but will eventually develop a rash    Tramadol Rash   Warfarin Sodium Rash     REVIEW OF SYSTEMS:  '[X]'$  denotes positive finding, '[ ]'$  denotes negative finding Cardiac  Comments:  Chest pain or chest pressure:    Shortness of breath upon exertion:    Short of breath when lying flat:    Irregular heart rhythm:        Vascular    Pain in calf, thigh, or hip brought on by ambulation:    Pain in feet at night that wakes you up from your sleep:     Blood clot in your veins:    Leg swelling:         Pulmonary    Oxygen at home:    Productive cough:     Wheezing:         Neurologic    Sudden weakness in arms or legs:     Sudden numbness in arms or legs:     Sudden onset of difficulty speaking or slurred speech:    Temporary loss of vision in one eye:     Problems with dizziness:         Gastrointestinal    Blood in stool:     Vomited blood:         Genitourinary    Burning when urinating:     Blood in urine:        Psychiatric    Major depression:         Hematologic    Bleeding problems:    Problems with blood clotting too easily:        Skin    Rashes or ulcers:        Constitutional    Fever or chills:      PHYSICAL EXAMINATION:  Vitals:  05/16/22 1141 05/17/22 0647  BP:  (!) 141/89  Pulse:  80  Resp:  17  Temp:  97.9 F (36.6 C)  TempSrc:  Oral  SpO2:  97%  Weight: 79.4 kg 79.4 kg  Height: '4\' 11"'$  (1.499 m) '4\' 11"'$  (1.499 m)    General:  WDWN in NAD; vital signs documented above Gait: Not observed HENT: WNL, normocephalic Pulmonary: normal non-labored breathing , without Rales, rhonchi,  wheezing Cardiac: regular HR Abdomen: soft, NT, no masses Skin: without rashes Vascular Exam/Pulses:  Right Left  Radial 2+ (normal) 2+ (normal)  Ulnar 1+ (weak) 1+ (weak)                    Extremities: without ischemic changes, without Gangrene , without cellulitis; without open wounds;  Musculoskeletal: no muscle wasting or atrophy  Neurologic: A&O X 3;  No focal weakness or paresthesias are detected Psychiatric:  The pt has Normal affect.   Non-Invasive Vascular Imaging:   Right Cephalic   Diameter (cm)Depth (cm)Findings  +-----------------+-------------+----------+--------+  Shoulder             0.41                         +-----------------+-------------+----------+--------+  Prox upper arm       0.42                         +-----------------+-------------+----------+--------+  Mid upper arm        0.37                         +-----------------+-------------+----------+--------+  Dist upper arm       0.37                         +-----------------+-------------+----------+--------+  Antecubital fossa 0.42 / 0.56                     +-----------------+-------------+----------+--------+  Prox forearm         0.30                joins    +-----------------+-------------+----------+--------+  Mid forearm          0.17                         +-----------------+-------------+----------+--------+  Dist forearm         0.13                         +-----------------+-------------+----------+--------+   +-----------------+-------------+----------+---------+  Right Basilic    Diameter (cm)Depth (cm)Findings   +-----------------+-------------+----------+---------+  Prox upper arm    0.40 / 0.36           branching  +-----------------+-------------+----------+---------+  Mid upper arm        0.39                          +-----------------+-------------+----------+---------+  Dist upper arm       0.41                          +-----------------+-------------+----------+---------+  Antecubital fossa    0.49                          +-----------------+-------------+----------+---------+  Prox  forearm         0.31                          +-----------------+-------------+----------+---------+     ASSESSMENT/PLAN:  Ariel [name] is a 46 y.o. female who presents with chronic kidney disease stage 4  Based on vein mapping and examination, she has an adequate cephalic and basilic veins in the right arm. I had an extensive discussion with this patient in regards to the nature of access surgery, including risk, benefits, and alternatives.   The patient is aware that the risks of access surgery include but are not limited to: bleeding, infection, steal syndrome, nerve damage, ischemic monomelic neuropathy, failure of access to mature, complications related to venous hypertension, and possible need for additional access procedures in the future.  I discussed with the patient the nature of the staged access procedure that she may need due to the size of her arm, specifically the need for a second operation to transpose the first stage fistula if it matures adequately.   After discussing the risk and benefits of right-sided brachiocephalic fistula creation, Ariel [name] elected to proceed  Ariel John, [name] [phone]

## 2022-05-17 NOTE — Progress Notes (Signed)
Ultrasound does not demonstrate any residual parathyroid tissue.  tmg  Armandina Gemma, MD [facility] [place] practice Office: [phone]

## 2022-05-17 NOTE — Transfer of Care (Signed)
Immediate Anesthesia Transfer of Care Note  Patient: Ariel [name]  Procedure(s) Performed: RIGHT ARM BRACHIOCEPHALIC ARTERIOVENOUS (AV) FISTULA CREATION (Right: Arm Upper)  Patient Location: PACU  Anesthesia Type:MAC combined with regional for post-op pain  Level of Consciousness: drowsy, patient cooperative and responds to stimulation  Airway & Oxygen Therapy: Patient Spontanous Breathing and Patient connected to nasal cannula oxygen  Post-op Assessment: Report given to RN and Post -op Vital signs reviewed and stable  Post vital signs: Reviewed and stable  Last Vitals:  Vitals Value Taken Time  BP    Temp    Pulse    Resp    SpO2      Last Pain:  Vitals:   05/17/22 0647  TempSrc: Oral  PainSc:       Patients Stated Pain Goal: 3 (27/03/50 0938)  Complications: No notable events documented.

## 2022-05-17 NOTE — Anesthesia Procedure Notes (Signed)
Anesthesia Regional Block: Supraclavicular block   Pre-Anesthetic Checklist: , timeout performed,  Correct Patient, Correct Site, Correct Laterality,  Correct Procedure, Correct Position, site marked,  Risks and benefits discussed,  Surgical consent,  Pre-op evaluation,  At surgeon's request and post-op pain management  Laterality: Upper and Right  Prep: chloraprep       Needles:  Injection technique: Single-shot  Needle Type: Stimiplex          Additional Needles:   Procedures:,,,, ultrasound used (permanent image in chart),,    Narrative:  Start time: 05/17/2022 7:00 AM End time: 05/17/2022 7:20 AM Injection made incrementally with aspirations every 5 mL.  Performed by: Personally  Anesthesiologist: Nolon Nations, MD  Additional Notes: BP cuff, SpO2 and EKG monitors applied. Sedation begun. Nerve location verified with ultrasound. Anesthetic injected incrementally, slowly, and after neg aspirations under direct u/s guidance. Good perineural spread. Tolerated well.

## 2022-05-18 ENCOUNTER — Telehealth: Payer: Self-pay | Admitting: Vascular Surgery

## 2022-05-18 ENCOUNTER — Encounter (HOSPITAL_COMMUNITY): Payer: Self-pay | Admitting: Vascular Surgery

## 2022-05-18 NOTE — Telephone Encounter (Signed)
-----   Message from [facility] [place] sent at 05/17/2022  8:54 AM EDT ----- S/p R brachiocephalic fistula creation, 9/14   Follow up in 4-6 weeks with fistula duplex

## 2022-05-28 DIAGNOSIS — D013 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: Secondary | ICD-10-CM | POA: Diagnosis not present

## 2022-05-29 ENCOUNTER — Encounter: Payer: Self-pay | Admitting: Cardiology

## 2022-05-30 DIAGNOSIS — J479 Bronchiectasis, uncomplicated: Secondary | ICD-10-CM | POA: Diagnosis not present

## 2022-05-30 DIAGNOSIS — R079 Chest pain, unspecified: Secondary | ICD-10-CM | POA: Diagnosis not present

## 2022-05-30 DIAGNOSIS — J90 Pleural effusion, not elsewhere classified: Secondary | ICD-10-CM | POA: Diagnosis not present

## 2022-05-30 NOTE — Telephone Encounter (Signed)
Patient has been scheduled for appointment. 

## 2022-06-04 DIAGNOSIS — J342 Deviated nasal septum: Secondary | ICD-10-CM | POA: Diagnosis not present

## 2022-06-04 DIAGNOSIS — H61303 Acquired stenosis of external ear canal, unspecified, bilateral: Secondary | ICD-10-CM | POA: Diagnosis not present

## 2022-06-04 DIAGNOSIS — R42 Dizziness and giddiness: Secondary | ICD-10-CM | POA: Diagnosis not present

## 2022-06-04 DIAGNOSIS — H903 Sensorineural hearing loss, bilateral: Secondary | ICD-10-CM | POA: Diagnosis not present

## 2022-06-06 DIAGNOSIS — Z940 Kidney transplant status: Secondary | ICD-10-CM | POA: Diagnosis not present

## 2022-06-06 DIAGNOSIS — N189 Chronic kidney disease, unspecified: Secondary | ICD-10-CM | POA: Diagnosis not present

## 2022-06-06 DIAGNOSIS — E785 Hyperlipidemia, unspecified: Secondary | ICD-10-CM | POA: Diagnosis not present

## 2022-06-06 DIAGNOSIS — R799 Abnormal finding of blood chemistry, unspecified: Secondary | ICD-10-CM | POA: Diagnosis not present

## 2022-06-13 DIAGNOSIS — H4710 Unspecified papilledema: Secondary | ICD-10-CM | POA: Diagnosis not present

## 2022-06-13 DIAGNOSIS — Z23 Encounter for immunization: Secondary | ICD-10-CM | POA: Diagnosis not present

## 2022-06-13 DIAGNOSIS — Z940 Kidney transplant status: Secondary | ICD-10-CM | POA: Diagnosis not present

## 2022-06-13 DIAGNOSIS — I770 Arteriovenous fistula, acquired: Secondary | ICD-10-CM | POA: Diagnosis not present

## 2022-06-13 DIAGNOSIS — N185 Chronic kidney disease, stage 5: Secondary | ICD-10-CM | POA: Diagnosis not present

## 2022-06-13 DIAGNOSIS — I120 Hypertensive chronic kidney disease with stage 5 chronic kidney disease or end stage renal disease: Secondary | ICD-10-CM | POA: Diagnosis not present

## 2022-06-13 DIAGNOSIS — D631 Anemia in chronic kidney disease: Secondary | ICD-10-CM | POA: Diagnosis not present

## 2022-06-14 DIAGNOSIS — H547 Unspecified visual loss: Secondary | ICD-10-CM | POA: Diagnosis not present

## 2022-06-14 DIAGNOSIS — F32A Depression, unspecified: Secondary | ICD-10-CM | POA: Diagnosis not present

## 2022-06-14 DIAGNOSIS — Z8672 Personal history of thrombophlebitis: Secondary | ICD-10-CM | POA: Diagnosis not present

## 2022-06-14 DIAGNOSIS — Z86718 Personal history of other venous thrombosis and embolism: Secondary | ICD-10-CM | POA: Diagnosis not present

## 2022-06-14 DIAGNOSIS — Z79624 Long term (current) use of inhibitors of nucleotide synthesis: Secondary | ICD-10-CM | POA: Diagnosis not present

## 2022-06-14 DIAGNOSIS — H47011 Ischemic optic neuropathy, right eye: Secondary | ICD-10-CM | POA: Diagnosis not present

## 2022-06-14 DIAGNOSIS — K219 Gastro-esophageal reflux disease without esophagitis: Secondary | ICD-10-CM | POA: Diagnosis not present

## 2022-06-14 DIAGNOSIS — E559 Vitamin D deficiency, unspecified: Secondary | ICD-10-CM | POA: Diagnosis present

## 2022-06-14 DIAGNOSIS — J90 Pleural effusion, not elsewhere classified: Secondary | ICD-10-CM | POA: Diagnosis not present

## 2022-06-14 DIAGNOSIS — F419 Anxiety disorder, unspecified: Secondary | ICD-10-CM | POA: Diagnosis not present

## 2022-06-14 DIAGNOSIS — N920 Excessive and frequent menstruation with regular cycle: Secondary | ICD-10-CM | POA: Diagnosis not present

## 2022-06-14 DIAGNOSIS — D631 Anemia in chronic kidney disease: Secondary | ICD-10-CM | POA: Diagnosis not present

## 2022-06-14 DIAGNOSIS — Z9089 Acquired absence of other organs: Secondary | ICD-10-CM | POA: Diagnosis not present

## 2022-06-14 DIAGNOSIS — Z79899 Other long term (current) drug therapy: Secondary | ICD-10-CM | POA: Diagnosis not present

## 2022-06-14 DIAGNOSIS — Z87440 Personal history of urinary (tract) infections: Secondary | ICD-10-CM | POA: Diagnosis not present

## 2022-06-14 DIAGNOSIS — Z888 Allergy status to other drugs, medicaments and biological substances status: Secondary | ICD-10-CM | POA: Diagnosis not present

## 2022-06-14 DIAGNOSIS — Z85828 Personal history of other malignant neoplasm of skin: Secondary | ICD-10-CM | POA: Diagnosis not present

## 2022-06-14 DIAGNOSIS — G4700 Insomnia, unspecified: Secondary | ICD-10-CM | POA: Diagnosis not present

## 2022-06-14 DIAGNOSIS — M109 Gout, unspecified: Secondary | ICD-10-CM | POA: Diagnosis present

## 2022-06-14 DIAGNOSIS — E7800 Pure hypercholesterolemia, unspecified: Secondary | ICD-10-CM | POA: Diagnosis not present

## 2022-06-14 DIAGNOSIS — R930 Abnormal findings on diagnostic imaging of skull and head, not elsewhere classified: Secondary | ICD-10-CM | POA: Diagnosis not present

## 2022-06-14 DIAGNOSIS — R519 Headache, unspecified: Secondary | ICD-10-CM | POA: Diagnosis not present

## 2022-06-14 DIAGNOSIS — Z940 Kidney transplant status: Secondary | ICD-10-CM | POA: Diagnosis not present

## 2022-06-14 DIAGNOSIS — H538 Other visual disturbances: Secondary | ICD-10-CM | POA: Diagnosis not present

## 2022-06-14 DIAGNOSIS — Z881 Allergy status to other antibiotic agents status: Secondary | ICD-10-CM | POA: Diagnosis not present

## 2022-06-14 DIAGNOSIS — J9811 Atelectasis: Secondary | ICD-10-CM | POA: Diagnosis not present

## 2022-06-14 DIAGNOSIS — N189 Chronic kidney disease, unspecified: Secondary | ICD-10-CM | POA: Diagnosis not present

## 2022-06-14 DIAGNOSIS — E039 Hypothyroidism, unspecified: Secondary | ICD-10-CM | POA: Diagnosis not present

## 2022-06-14 DIAGNOSIS — T8619 Other complication of kidney transplant: Secondary | ICD-10-CM | POA: Diagnosis not present

## 2022-06-14 DIAGNOSIS — I129 Hypertensive chronic kidney disease with stage 1 through stage 4 chronic kidney disease, or unspecified chronic kidney disease: Secondary | ICD-10-CM | POA: Diagnosis not present

## 2022-06-14 DIAGNOSIS — Z885 Allergy status to narcotic agent status: Secondary | ICD-10-CM | POA: Diagnosis not present

## 2022-06-14 DIAGNOSIS — H5461 Unqualified visual loss, right eye, normal vision left eye: Secondary | ICD-10-CM | POA: Diagnosis not present

## 2022-06-14 DIAGNOSIS — H4710 Unspecified papilledema: Secondary | ICD-10-CM | POA: Diagnosis not present

## 2022-06-14 DIAGNOSIS — H53481 Generalized contraction of visual field, right eye: Secondary | ICD-10-CM | POA: Diagnosis not present

## 2022-06-14 DIAGNOSIS — H539 Unspecified visual disturbance: Secondary | ICD-10-CM | POA: Diagnosis not present

## 2022-06-15 DIAGNOSIS — H47011 Ischemic optic neuropathy, right eye: Secondary | ICD-10-CM

## 2022-06-15 DIAGNOSIS — H547 Unspecified visual loss: Secondary | ICD-10-CM

## 2022-06-15 DIAGNOSIS — H53481 Generalized contraction of visual field, right eye: Secondary | ICD-10-CM | POA: Diagnosis not present

## 2022-06-15 DIAGNOSIS — H5461 Unqualified visual loss, right eye, normal vision left eye: Secondary | ICD-10-CM | POA: Diagnosis not present

## 2022-06-15 HISTORY — DX: Ischemic optic neuropathy, right eye: H47.011

## 2022-06-15 HISTORY — DX: Unspecified visual loss: H54.7

## 2022-06-17 ENCOUNTER — Other Ambulatory Visit: Payer: Self-pay | Admitting: *Deleted

## 2022-06-17 DIAGNOSIS — N186 End stage renal disease: Secondary | ICD-10-CM

## 2022-06-18 DIAGNOSIS — Z0189 Encounter for other specified special examinations: Secondary | ICD-10-CM | POA: Diagnosis not present

## 2022-06-20 NOTE — Progress Notes (Signed)
POST OPERATIVE OFFICE NOTE    CC:  F/u for surgery  HPI:  This is a 46 y.o. female who is s/p right BC AVF on 05/17/2022 by Dr. Virl [name].    The patient has had multiple prior access procedures on the left upper extremity. Per pt, previous tunneled lines have been placed in bilateral IJ's.  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  She is undergone kidney transplant x2 with the first 1 lasting 8 years, second lasting 15 years.  She currently lives in [place] with her mother and lives on disability due to her illness.   On exam Ariel [name] was doing well, accompanied by her mother.  Her current transplant has started to fail, necessitating long-term HD access.  Pt states she does not have pain/numbness in the right hand.    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  She was hospitalized for 4 days at [facility] and did not find out a cause of her blindness.   The pt is not yet on dialysis.     Allergies  Allergen Reactions   Bactrim [Sulfamethoxazole-Trimethoprim] Anaphylaxis   Sulfamethoxazole Anaphylaxis   Ambien [Zolpidem]     Sleep walking, hallucinations   Hydrogen Peroxide Rash   Labetalol Hives and Rash   Wound Dressing Adhesive Hives   Amlodipine Swelling   Hyoscyamine Other (See Comments)    Dizziness    Keflex [Cephalexin] Hives and Swelling    Per pt, her face and lips swell up and she develops hives   Orphenadrine Hives   Adhesive [Tape] Rash    Can not use for long periods of time (3 days or more)   Imuran [Azathioprine Sodium] Rash   Neosporin [Bacitracin-Polymyxin B] Rash and Other (See Comments)    Can use that for awhile, but will eventually develop a rash    Tramadol Rash   Warfarin Sodium Rash    Current Outpatient Medications  Medication Sig Dispense Refill   acetaminophen (TYLENOL) 500 MG tablet Take 500 mg by mouth every 6 (six) hours as needed for headache (pain).     allopurinol (ZYLOPRIM) 100  MG tablet Take 100 mg by mouth daily.     ALPRAZolam (XANAX) 0.5 MG tablet Take 0.5 mg by mouth at bedtime.   1   aspirin-acetaminophen-caffeine (EXCEDRIN MIGRAINE) 250-250-65 MG tablet Take 1 tablet by mouth every 6 (six) hours as needed for headache. 30 tablet 0   carvedilol (COREG) 25 MG tablet Take 25 mg by mouth 2 (two) times daily with a meal.     cinacalcet (SENSIPAR) 30 MG tablet Take 30 mg by mouth daily.     famotidine (PEPCID) 20 MG tablet Take 1 tablet (20 mg total) by mouth daily. 90 tablet 3   fluticasone (FLONASE) 50 MCG/ACT nasal spray Place 2 sprays into both nostrils daily as needed for allergies.     furosemide (LASIX) 40 MG tablet Take 40 mg by mouth daily as needed for fluid or edema.      gabapentin (NEURONTIN) 300 MG capsule Take 300 mg by mouth at bedtime.     hydrocortisone (ANUSOL-HC) 2.5 % rectal cream HC 2.5% BID x 10 days perianal area (Patient taking differently: Place 1 Application rectally 2 (two) times daily as needed (irritation). HC 2.5% BID x 10 days perianal area) 28 g 0   hydrOXYzine (ATARAX) 25 MG tablet Take 25-50 mg by mouth 2 (two) times daily as needed for dizziness  or anxiety.     isosorbide mononitrate (IMDUR) 30 MG 24 hr tablet Take 1 tablet (30 mg total) by mouth every morning. 90 tablet 3   magnesium oxide (MAG-OX) 400 (240 Mg) MG tablet Take 400 mg by mouth daily.     medroxyPROGESTERone (PROVERA) 10 MG tablet Take 10 mg by mouth daily.  3   mycophenolate (MYFORTIC) 360 MG TBEC EC tablet Take 360 mg by mouth 2 (two) times daily.     nitroGLYCERIN (NITROSTAT) 0.4 MG SL tablet Place 1 tablet (0.4 mg total) under the tongue every 5 (five) minutes as needed. 25 tablet 6   ondansetron (ZOFRAN-ODT) 4 MG disintegrating tablet Take 4 mg by mouth as needed for nausea/vomiting.     oxyCODONE-acetaminophen (PERCOCET) 5-325 MG tablet Take 1 tablet by mouth every 6 (six) hours as needed for severe pain. 20 tablet 0   pantoprazole (PROTONIX) 40 MG tablet Take 1  tablet (40 mg total) by mouth daily. 90 tablet 4   Potassium Chloride ER 20 MEQ TBCR Take 20 mEq by mouth daily.     predniSONE (DELTASONE) 5 MG tablet Take 5 mg by mouth daily.     sertraline (ZOLOFT) 50 MG tablet Take 50 mg by mouth daily.     sevelamer carbonate (RENVELA) 800 MG tablet Take 800 mg by mouth 3 (three) times daily with meals.     simvastatin (ZOCOR) 5 MG tablet Take 5 mg by mouth at bedtime.     sodium bicarbonate 650 MG tablet Take 1,300 mg by mouth 2 (two) times daily.     tacrolimus (PROGRAF) 0.5 MG capsule Take 0.5 mg by mouth 2 (two) times daily. Take along with 1 mg capsule=1.5 mg BID     tacrolimus (PROGRAF) 1 MG capsule Take 1 mg by mouth 2 (two) times daily. Take along with 0.5 mg capsule=1.5 mg BID  6   traZODone (DESYREL) 100 MG tablet Take 100 mg by mouth at bedtime.     No current facility-administered medications for this visit.     ROS:  See HPI  Physical Exam:  Today's Vitals   06/22/22 1403  BP: 117/77  Pulse: 75  Resp: 14  Temp: (!) 97.3 F (36.3 C)  TempSrc: Temporal  SpO2: 98%  Weight: 179 lb (81.2 kg)  Height: '4\' 11"'$  (1.499 m)   Body mass index is 36.15 kg/m.   Incision:  well healed Extremities:   There is a palpable right radial pulse.   Motor and sensory are in tact.   There is a thrill/bruit present.  Access is  easily palpable at the antecubital fossa then more difficult more proximal   Dialysis Duplex on 06/22/2022: +------------+----------+-------------+----------+--------+  OUTFLOW VEINPSV (cm/s)Diameter (cm)Depth (cm)Describe  +------------+----------+-------------+----------+--------+  Prox UA        138        0.81        1.71             +------------+----------+-------------+----------+--------+  Mid UA          80        0.83        0.76             +------------+----------+-------------+----------+--------+  Dist UA        127        0.89        0.61              +------------+----------+-------------+----------+--------+  AC Fossa  339        0.85        0.42             +------------+----------+-------------+----------+--------+    Assessment/Plan:  This is a 46 y.o. female who is s/p: Right BC AVF 05/17/2022 by Dr. Virl [name]   -the pt does not have evidence of steal. -the fistula has matured nicely, but unfortunately, it is too deep and will need superficialization.  Discussed this with pt and she would like to proceed.  Will schedule on day that is convenient for her and Dr. Virl [name].   -she is not yet on HD and is followed by Dr. Hollie Salk.    Leontine Locket, [facility] [phone]  Clinic MD:  Ariel [name]

## 2022-06-22 ENCOUNTER — Ambulatory Visit (INDEPENDENT_AMBULATORY_CARE_PROVIDER_SITE_OTHER): Payer: Medicare Other | Admitting: Physician Assistant

## 2022-06-22 ENCOUNTER — Other Ambulatory Visit: Payer: Self-pay

## 2022-06-22 ENCOUNTER — Ambulatory Visit (HOSPITAL_COMMUNITY)
Admission: RE | Admit: 2022-06-22 | Discharge: 2022-06-22 | Disposition: A | Discharge status: Home / Self Care | Payer: Medicare Other | Source: Ambulatory Visit | Attending: Vascular Surgery | Admitting: Vascular Surgery

## 2022-06-22 VITALS — BP 117/77 | HR 75 | Temp 97.3°F | Resp 14 | Ht 59.0 in | Wt 179.0 lb

## 2022-06-22 DIAGNOSIS — N184 Chronic kidney disease, stage 4 (severe): Secondary | ICD-10-CM

## 2022-06-22 DIAGNOSIS — N186 End stage renal disease: Secondary | ICD-10-CM | POA: Diagnosis not present

## 2022-06-22 NOTE — Telephone Encounter (Signed)
Chest CT mild bronchiectasis in the right upper and middle lobe. Overall stable to normal.

## 2022-06-25 DIAGNOSIS — D013 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: Secondary | ICD-10-CM | POA: Diagnosis not present

## 2022-06-26 DIAGNOSIS — Z79899 Other long term (current) drug therapy: Secondary | ICD-10-CM | POA: Diagnosis not present

## 2022-06-26 DIAGNOSIS — H47011 Ischemic optic neuropathy, right eye: Secondary | ICD-10-CM | POA: Diagnosis not present

## 2022-06-26 DIAGNOSIS — N184 Chronic kidney disease, stage 4 (severe): Secondary | ICD-10-CM | POA: Diagnosis not present

## 2022-06-26 DIAGNOSIS — Z6836 Body mass index (BMI) 36.0-36.9, adult: Secondary | ICD-10-CM | POA: Diagnosis not present

## 2022-07-02 ENCOUNTER — Encounter (HOSPITAL_COMMUNITY): Payer: Self-pay | Admitting: Vascular Surgery

## 2022-07-02 NOTE — Anesthesia Preprocedure Evaluation (Signed)
Anesthesia Evaluation  Patient identified by MRN, date of birth, ID band Patient awake    Reviewed: Allergy & Precautions, NPO status , Patient's Chart, lab work & pertinent test results  Airway Mallampati: III  TM Distance: >3 FB Neck ROM: Full    Dental no notable dental hx.    Pulmonary neg pulmonary ROS,    Pulmonary exam normal        Cardiovascular hypertension, Pt. on home beta blockers + DVT  Normal cardiovascular exam     Neuro/Psych PSYCHIATRIC DISORDERS Anxiety Depression negative neurological ROS     GI/Hepatic Neg liver ROS, GERD  Medicated and Controlled,  Endo/Other  Nephrogenic diabetes insipidus  Renal/GU ESRF and DialysisRenal diseaseS/p kidney transplant      Musculoskeletal negative musculoskeletal ROS (+)   Abdominal (+) + obese,   Peds  Hematology  (+) Blood dyscrasia, anemia ,   Anesthesia Other Findings CHRONIC KIDNEY DISEASE STAGE 4  Reproductive/Obstetrics S/p BTL                           Anesthesia Physical Anesthesia Plan  ASA: 4  Anesthesia Plan: Regional   Post-op Pain Management:    Induction: Intravenous  PONV Risk Score and Plan: 2 and Ondansetron, Propofol infusion, Dexamethasone and Treatment may vary due to age or medical condition  Airway Management Planned: Simple Face Mask  Additional Equipment:   Intra-op Plan:   Post-operative Plan:   Informed Consent: I have reviewed the patients History and Physical, chart, labs and discussed the procedure including the risks, benefits and alternatives for the proposed anesthesia with the patient or authorized representative who has indicated his/her understanding and acceptance.     Dental advisory given  Plan Discussed with: CRNA  Anesthesia Plan Comments: (PAT note written 07/02/2022 by Myra Gianotti, PA-C. )       Anesthesia Quick Evaluation

## 2022-07-02 NOTE — Progress Notes (Signed)
PCP - [name], PA-C Cardiologist - Dr Einar Pheasant - Dr Lyndel Safe Nephrology - Dr Madelon Lips  Chest x-ray - 02/14/22, 06/14/22 CE EKG - 05/09/22 Stress Test - 05/15/22 ECHO - 12/21/20 Cardiac Cath - n/a  ICD Pacemaker/Loop - n/a  Sleep Study -  n/a CPAP - none  Anesthesia review: Yes   STOP now taking any Aspirin (unless otherwise instructed by your surgeon), Aleve, Naproxen, Ibuprofen, Motrin, Advil, Goody's, BC's, all herbal medications, fish oil, and all vitamins.   Coronavirus Screening Do you have any of the following symptoms:  Cough yes/no: No Fever (>100.56F)  yes/no: No Runny nose yes/no: No Sore throat yes/no: No Difficulty breathing/shortness of breath  yes/no: No  Have you traveled in the last 14 days and where? yes/no: No  Patient verbalized understanding of instructions that were given via phone.

## 2022-07-02 NOTE — Progress Notes (Signed)
Anesthesia Chart Review: Ariel [name]   Case: [id number] Date/Time: 07/03/22 0715   Procedure: RIGHT ARM FISTULA SUPERFICIALIZATION (Right)   Anesthesia type: Monitor Anesthesia Care   Pre-op diagnosis: CHRONIC KIDNEY DISEASE STAGE 4   Location: [facility] OR ROOM 12 / [facility] OR   Surgeons: [name], MD       DISCUSSION: Patient is a 46 year old female scheduled for the above procedure. S/p right brachiocephalic AVF 10/27/80.   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   She had cardiology evaluation by Dr. Geraldo Pitter last on 05/09/22 for preoperative evaluation in setting of atypical chest pain. Stress test low risk, EF 65% on 05/15/22.   [place] admission 06/15/22-06/16/22 for progressive right eye blurriness to vision loss over ~ 3 weeks. Referred to ED by ophthalmologist due to optic nerve swelling with concern for GCA or optic neuritis. Per Discharge Summary, "Upon presentation, ophthalmology was consulted. Their examination was notable for OD APD, decreased color vision. MRI orbits notable for nonspecific T1 hyperintense focus with diffusion restriction at R optic nerve disc that was nonenhancing. MRA head/neck without concern for LVO/stenosis and MRV without CVST. Infectious/rheumatologic workup notable for unremarkable ESR, CRP, ANCA, RPR, ANA, ACE, with pending results as above. 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 Given this, there was no concern for atypical optic neuritis. She additionally had LP completed 10/14 with mildly elevated opening pressure (29), however given Hx of kidney disease as well as no c/f papilledema on OCT, there was low concern for IIH (further, her unilateral symptoms would be a rare presentation of such). CSF cell counts, protein, and glucose all wnl and thus further reassuring against infectious, inflammatory, demyelinating etiology. Pending CSF studies as above. Plan on discharge to follow up with neuro ophthalmology (already scheduled) and neurology (to be scheduled)." She was started on Vitamin D for vitamin D insufficiency with hypocalcemia and hight PTH levels, Rocaltrol, and Phoslo. Myfortic dose decreased given recent SCC of anus and Tacrolimus increased. Per lab results in [place], since discharge 06/18/22 CSF Oligoclonal Bands resulted as elevated at 3 (normal < 2) which can be seen in MS, but "These findings, however, are not specific for MS  as CSF-specific IgG synthesis may also be found in patients  with other neurologic diseases including infectious,  inflammatory, cerebrovascular, and paraneoplastic  disorders. Clinical correlation recommended." I do not see any follow-up neurology or neuro-ophthalmology office visits yet in Care Everywhere.   Meds include Myfortric, prednisone 5 mg daily, Prograf.  She is a same day work-up, so anesthesia team to evaluate on the day of surgery.    VS: LMP 09/13/2013  BP Readings from Last 3 Encounters:  06/22/22 117/77  05/17/22 (!) 155/66  05/09/22 (!) 142/76   Pulse Readings from Last 3 Encounters:  06/22/22 75  05/17/22 87  05/09/22 76     PROVIDERS: [name], PA-C is PCP  Madelon Lips, MD  is nephrologist Revankar, Sunny Schlein, MD is cardiologist Jackquline Denmark, MD is GI  Edison Nasuti, MD is HEM[facility] T[facility] Sleeper). Last visit 06/25/22 for follow-up anal HSIL/squamous carcinoma in situ. Skin irritation improved since last ivsit, but some areas that can be excised. Exam under anesthesia with excision of anal lesion is planned for 07/18/22.     LABS: For labs on arrival. Last results in [facility] include: Lab Results  Component Value Date   WBC 9.4 02/17/2022   HGB 9.2 (L) 05/17/2022   HCT 27.0 (L) 05/17/2022   PLT 196 02/17/2022   GLUCOSE 97 05/17/2022   ALT 9 02/17/2022   AST 11 (L) 02/17/2022   NA 141 05/17/2022   K 4.6 05/17/2022   CL 106 05/17/2022   CREATININE 4.70 (H) 05/17/2022   BUN 51 (H) 05/17/2022   CO2 20 (L) 02/17/2022   INR 0.97 10/03/2013    EGD 02/28/22: IMPRESSIONS: - LA Grade B reflux esophagitis with no bleeding. Biopsied. - Small hiatal hernia. - Non-bleeding gastric ulcer with no stigmata of bleeding. Biopsied. - Normal examined duodenum. Biopsied.   IMAGES: MRA head/neck 06/15/22 ([facility]): IMPRESSION:  No large vessel occlusion or hemodynamically significant stenosis in the  neck. Note the origin of the ophthalmic arteries is patent, although  evaluation is limited on MRA.   MRI Orbits 06/15/22 ([facility]): IMPRESSION:  1.  Nonspecific 3 mm T2 hyperintense focus with diffusion restriction at  the right optic nerve disc. Differential considerations include central  retinal artery occlusion versus atypical optic neuritis .   MRI head with venogram 06/15/22 ([facility]): IMPRESSION:  No evidence of dural venous thrombosis.    CXR 06/14/22 ([facility]: IMPRESSION: 1.  Cardiomediastinal silhouette is mildly enlarged.  2.  Nonspecific diffuse bilateral interstitial prominence. Tiny left  pleural effusion and associated left basilar atelectasis. Right basilar  atelectasis/scarring. No lobar consolidation.  3.  No pneumothorax.  4.  Surgical clips projecting over the neck.    US Thyroid 05/11/22: IMPRESSION: - Nodule 3 (TI-RADS 4), measuring 1.0  cm, located in the inferior left thyroid lobe meets criteria for imaging follow-up. Annual ultrasound surveillance is recommended until 5 years of stability is documented. - The above is in keeping with the ACR TI-RADS recommendations - J Am Coll Radiol 2017;14:587-595.  NM Parathyroid 02/27/22: IMPRESSION: Exam suspicious for a LEFT superior parathyroid adenoma; consider thyroid ultrasound evaluation.  MRI Abd 02/15/22: IMPRESSION: 1. Marked enlargement of the transplanted kidney with surrounding edema and or fluid. The appearance is not substantially changed compared to the very recent CT accounting for differences in technique. There is a rim of fluid signal surrounding this transplanted kidney which is enlarged. Differential considerations include chronically infected RIGHT iliac fossa transplant kidney versus rare complication of renal transplant related lymphangiectasia. Ultimately, assessment by the Renal Transplant Team may be helpful to determine next steps in management. Findings are little changed as far back as March 2023 and actually appear improved when compared to April of 2019. 2. Unusual trauma near complete resolution of visible pancreatic ductal dilation that was seen on the prior study. Transient ductal obstruction is considered but there is no imaging sign of pancreatic inflammation. A small 5 mm lesion is present in the head of the pancreas for which follow-up in 1 year with MRI/MRCP is suggested. There is a change in abdominal symptoms could consider lipase correlation with imaging earlier as warranted for further evaluation.    EKG: 05/09/22: NSR Prolonged QT (QTc 486 ms)   CV:  Nuclear stress test 05/15/22:   The study is normal. The study is low risk.   Left ventricular function is normal. Nuclear stress EF: 64 %. The left ventricular ejection fraction is normal (55-65%). End diastolic cavity size is normal.   Increased uptake of isotope seen in the mid  right lung field.  Clinical correlation suggested    Echo 12/21/20: IMPRESSIONS   1. Left ventricular ejection fraction, by estimation, is 60 to 65%. The  left ventricle has normal function. The left ventricle has no regional  wall motion abnormalities. There is severe concentric left ventricular  hypertrophy. Left ventricular diastolic   parameters are consistent with Grade I diastolic dysfunction (impaired  relaxation). The average left ventricular global longitudinal strain is  -9.3 %. The global longitudinal strain is abnormal.   2. Right ventricular systolic function is normal. The right ventricular  size is normal. There is normal pulmonary artery systolic pressure.   3. The mitral valve is degenerative. No evidence of mitral valve  regurgitation. No evidence of mitral stenosis.   4. The aortic valve is tricuspid. Aortic valve regurgitation is not  visualized. No aortic stenosis is present.   5. The inferior vena cava is normal in size with greater than 50%  respiratory variability, suggesting right atrial pressure of 3 mmHg.    Past Medical History:  Diagnosis Date   Abdominal distension (gaseous) 04/16/2018   Abdominal pain 02/14/2022   Abnormal CT scan, gastrointestinal tract    AIN grade II    AIN grade III 04/05/2020   AKI (acute kidney injury) ([place]) 08/18/2021   Anemia    Anemia associated with chronic renal failure    Anorectal disorder 06/08/2020   Anorectal pain 06/08/2020   Anxiety disorder 10/29/2018   Back pain    Bloating 04/16/2018   Cancer ([place])    pre cervical   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    of the [place]   Chronic kidney disease, stage V ([place]) 74/08/8785   Complications due to cardiac device, implant, and graft 09/02/2012   Condyloma of female genitalia    Dehydration 01/19/2019   Depression    Dialysis patient [facility])    Drug-induced bleeding disorder ([place]) 01/19/2019   DVT of axillary vein, acute left ([place]) 08/02/2012    Dysmenorrhea 10/19/2015   Dyspnea on exertion 01/19/2019   Dysuria    End stage renal disease ([place]) 09/02/2012   ESRD (end stage renal disease) ([place])    sp transplant x 2, did mix of HD and PD from 2001- 2008   Essential hypertension 11/03/2021   Essential hypertension with goal blood pressure less than 130/80    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   Fatigue    Gastritis, acute    GERD without esophagitis 12/20/2021   Gout    History of parathyroidectomy ([place]) 10/29/2018   History of renal transplant    HTN (hypertension) 01/19/2019   Hypercholesterolemia    Hyperlipidemia    Hyperphosphatemia 12/20/2021   Hypokalemia    Hypomagnesemia    Hypothyroid 01/19/2019   IBS (irritable bowel syndrome)    Immunosuppressive management encounter following kidney transplant 10/29/2018   Internal hemorrhoid 04/22/2018   Iron deficiency anemia 10/19/2015   Irregular uterine bleeding 02/13/2016   Kidney disorder 06/08/2020   Kidney transplant recipient 10/29/2018   1st transplant [facility]) was 1995- 2001.  Did HD/ PD mix from 2001- 2008. 2nd transplant ([facility]/ [facility]) was in 2008 and is still working as of 12/2021. F/b  Dr Hollie Salk ([place]) and [facility] transplant team.   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   Obesity, Class I, BMI 30-34.9 12/21/2021   Other chronic sinusitis 10/29/2018   Palpitations    Pancreatic duct dilated 02/15/2022   Pancreatitis    Perianal lesion 04/22/2018   Phlebitis and thrombophlebitis of other sites 08/02/2012   Plantar fasciitis 07/03/2018   Pre-transplant evaluation for kidney transplant 01/09/2022   Pruritus  ani 06/26/2018   Renal failure, chronic 11/03/2021   Dialysis in the past   Right lower quadrant abdominal pain 10/29/2018   [facility] spotted fever    S/p cadaver renal transplant 08/02/2012   Secondary hyperparathyroidism of renal origin [facility])    Secondary hypertension due to renal disease 10/29/2018   Sesamoiditis 07/03/2018   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    Transplanted organ removal status 10/29/2018   Unstable angina ([place]) 01/19/2019   Urinary tract infection    Vulvar dystrophy    Yeast dermatitis 08/14/2018    Past Surgical History:  Procedure Laterality Date   AV FISTULA PLACEMENT     AV FISTULA PLACEMENT Right 05/17/2022   Procedure: RIGHT ARM BRACHIOCEPHALIC ARTERIOVENOUS (AV) FISTULA CREATION;  Surgeon: [name], MD;  Location: [facility] OR;  Service: Vascular;  Laterality: Right;   COLONOSCOPY  11/28/2005   [facility]   COLONOSCOPY  04/13/2016   Dr Orlena Sheldon   ESOPHAGOGASTRODUODENOSCOPY  07/31/2017   Distal esophageal ulcers (likely due to gastroeesphageal reflix-biopsied). Small hiatal hernia. Erosive gastritis.   HEMORRHOID SURGERY     2019, and 04/29/2020   INSERTION OF DIALYSIS CATHETER     KIDNEY TRANSPLANT  1995, 2008   KNEE SURGERY Left 2020   PARATHYROIDECTOMY  2004   Portion on the parathyroid gland transplanted in R forearm   REMOVAL OF A DIALYSIS CATHETER     SIMPLE VULVECTOMY  06/19/2011   Partial simple left   SKIN CANCER EXCISION  2016   Per pt, area removed on scalp showed squamous cell carcinoma   TUBAL LIGATION     UPPER GASTROINTESTINAL ENDOSCOPY      MEDICATIONS: No current facility-administered medications for this encounter.    acetaminophen (TYLENOL) 500 MG tablet   allopurinol (ZYLOPRIM) 100 MG tablet   ALPRAZolam (XANAX) 0.5 MG tablet   aspirin-acetaminophen-caffeine (EXCEDRIN MIGRAINE) 250-250-65 MG tablet   carvedilol (COREG) 25 MG tablet   famotidine (PEPCID) 20 MG tablet   fluticasone (FLONASE) 50  MCG/ACT nasal spray   furosemide (LASIX) 40 MG tablet   gabapentin (NEURONTIN) 300 MG capsule   hydrOXYzine (ATARAX) 25 MG tablet   isosorbide mononitrate (IMDUR) 30 MG 24 hr tablet   magnesium oxide (MAG-OX) 400 (240 Mg) MG tablet   medroxyPROGESTERone (PROVERA) 10 MG tablet   mycophenolate (MYFORTIC) 180 MG EC tablet   nitroGLYCERIN (NITROSTAT) 0.4 MG SL tablet   ondansetron (ZOFRAN-ODT) 4 MG disintegrating tablet   pantoprazole (PROTONIX) 40 MG tablet   Phenylephrine-Acetaminophen (TYLENOL SINUS+HEADACHE) 5-325 MG TABS   Potassium Chloride ER 20 MEQ TBCR   predniSONE (DELTASONE) 5 MG tablet   sertraline (ZOLOFT) 50 MG tablet   sevelamer carbonate (RENVELA) 800  MG tablet   simvastatin (ZOCOR) 5 MG tablet   sodium bicarbonate 650 MG tablet   tacrolimus (PROGRAF) 0.5 MG capsule   traZODone (DESYREL) 100 MG tablet   hydrocortisone (ANUSOL-HC) 2.5 % rectal cream   oxyCODONE-acetaminophen (PERCOCET) 5-325 MG tablet    Myra Gianotti, PA-C Surgical Short Stay/Anesthesiology [facility] Phone [phone] [facility] Phone [phone] 07/02/2022 11:29 AM

## 2022-07-03 ENCOUNTER — Ambulatory Visit (HOSPITAL_COMMUNITY): Payer: Medicare Other | Admitting: Vascular Surgery

## 2022-07-03 ENCOUNTER — Encounter (HOSPITAL_COMMUNITY): Payer: Self-pay | Admitting: Vascular Surgery

## 2022-07-03 ENCOUNTER — Ambulatory Visit (HOSPITAL_COMMUNITY)
Admission: RE | Admit: 2022-07-03 | Discharge: 2022-07-03 | Disposition: A | Discharge status: Home / Self Care | Payer: Medicare Other | Attending: Vascular Surgery | Admitting: Vascular Surgery

## 2022-07-03 ENCOUNTER — Other Ambulatory Visit: Payer: Self-pay

## 2022-07-03 ENCOUNTER — Encounter (HOSPITAL_COMMUNITY)
Admission: RE | Disposition: A | Discharge status: Home / Self Care | Payer: Self-pay | Source: Home / Self Care | Attending: Vascular Surgery

## 2022-07-03 DIAGNOSIS — E1122 Type 2 diabetes mellitus with diabetic chronic kidney disease: Secondary | ICD-10-CM | POA: Insufficient documentation

## 2022-07-03 DIAGNOSIS — R062 Wheezing: Secondary | ICD-10-CM | POA: Diagnosis not present

## 2022-07-03 DIAGNOSIS — Z87440 Personal history of urinary (tract) infections: Secondary | ICD-10-CM | POA: Diagnosis not present

## 2022-07-03 DIAGNOSIS — D631 Anemia in chronic kidney disease: Secondary | ICD-10-CM | POA: Insufficient documentation

## 2022-07-03 DIAGNOSIS — R079 Chest pain, unspecified: Secondary | ICD-10-CM | POA: Insufficient documentation

## 2022-07-03 DIAGNOSIS — E669 Obesity, unspecified: Secondary | ICD-10-CM | POA: Insufficient documentation

## 2022-07-03 DIAGNOSIS — N184 Chronic kidney disease, stage 4 (severe): Secondary | ICD-10-CM | POA: Insufficient documentation

## 2022-07-03 DIAGNOSIS — J90 Pleural effusion, not elsewhere classified: Secondary | ICD-10-CM | POA: Diagnosis not present

## 2022-07-03 DIAGNOSIS — Z992 Dependence on renal dialysis: Secondary | ICD-10-CM | POA: Insufficient documentation

## 2022-07-03 DIAGNOSIS — H5461 Unqualified visual loss, right eye, normal vision left eye: Secondary | ICD-10-CM | POA: Diagnosis not present

## 2022-07-03 DIAGNOSIS — T82898A Other specified complication of vascular prosthetic devices, implants and grafts, initial encounter: Secondary | ICD-10-CM | POA: Insufficient documentation

## 2022-07-03 DIAGNOSIS — Z5309 Procedure and treatment not carried out because of other contraindication: Secondary | ICD-10-CM | POA: Insufficient documentation

## 2022-07-03 DIAGNOSIS — Z6836 Body mass index (BMI) 36.0-36.9, adult: Secondary | ICD-10-CM | POA: Insufficient documentation

## 2022-07-03 DIAGNOSIS — K2100 Gastro-esophageal reflux disease with esophagitis, without bleeding: Secondary | ICD-10-CM | POA: Diagnosis not present

## 2022-07-03 DIAGNOSIS — I129 Hypertensive chronic kidney disease with stage 1 through stage 4 chronic kidney disease, or unspecified chronic kidney disease: Secondary | ICD-10-CM | POA: Diagnosis not present

## 2022-07-03 DIAGNOSIS — F419 Anxiety disorder, unspecified: Secondary | ICD-10-CM | POA: Insufficient documentation

## 2022-07-03 DIAGNOSIS — E232 Diabetes insipidus: Secondary | ICD-10-CM | POA: Diagnosis not present

## 2022-07-03 DIAGNOSIS — Z940 Kidney transplant status: Secondary | ICD-10-CM | POA: Diagnosis not present

## 2022-07-03 DIAGNOSIS — X58XXXA Exposure to other specified factors, initial encounter: Secondary | ICD-10-CM | POA: Diagnosis not present

## 2022-07-03 DIAGNOSIS — J9811 Atelectasis: Secondary | ICD-10-CM | POA: Insufficient documentation

## 2022-07-03 LAB — POCT I-STAT, CHEM 8
BUN: 33 mg/dL — ABNORMAL HIGH (ref 6–20)
Calcium, Ion: 1.02 mmol/L — ABNORMAL LOW (ref 1.15–1.40)
Chloride: 103 mmol/L (ref 98–111)
Creatinine, Ser: 4.7 mg/dL — ABNORMAL HIGH (ref 0.44–1.00)
Glucose, Bld: 99 mg/dL (ref 70–99)
HCT: 26 % — ABNORMAL LOW (ref 36.0–46.0)
Hemoglobin: 8.8 g/dL — ABNORMAL LOW (ref 12.0–15.0)
Potassium: 4.3 mmol/L (ref 3.5–5.1)
Sodium: 140 mmol/L (ref 135–145)
TCO2: 25 mmol/L (ref 22–32)

## 2022-07-03 SURGERY — CANCELLED PROCEDURE
Anesthesia: Regional

## 2022-07-03 MED ORDER — CHLORHEXIDINE GLUCONATE 0.12 % MT SOLN
OROMUCOSAL | Status: AC
  Administered 2022-07-03: 15 mL via OROMUCOSAL
  Filled 2022-07-03: qty 15

## 2022-07-03 MED ORDER — ACETAMINOPHEN 500 MG PO TABS
1000.0000 mg | ORAL_TABLET | Freq: Once | ORAL | Status: AC
  Administered 2022-07-03: 1000 mg via ORAL
  Filled 2022-07-03: qty 2

## 2022-07-03 MED ORDER — PROMETHAZINE HCL 25 MG/ML IJ SOLN
INTRAMUSCULAR | Status: AC
  Filled 2022-07-03: qty 1

## 2022-07-03 MED ORDER — MIDAZOLAM HCL 2 MG/2ML IJ SOLN
INTRAMUSCULAR | Status: DC | PRN
  Administered 2022-07-03: 2 mg via INTRAVENOUS

## 2022-07-03 MED ORDER — LIDOCAINE-EPINEPHRINE (PF) 1 %-1:200000 IJ SOLN
INTRAMUSCULAR | Status: AC
  Filled 2022-07-03: qty 30

## 2022-07-03 MED ORDER — CHLORHEXIDINE GLUCONATE 4 % EX LIQD: 60.0000 mL | Freq: Once | CUTANEOUS | Status: DC

## 2022-07-03 MED ORDER — VANCOMYCIN HCL IN DEXTROSE 1-5 GM/200ML-% IV SOLN
INTRAVENOUS | Status: AC
  Filled 2022-07-03: qty 200

## 2022-07-03 MED ORDER — CHLORHEXIDINE GLUCONATE 0.12 % MT SOLN: 15.0000 mL | Freq: Once | OROMUCOSAL | Status: AC

## 2022-07-03 MED ORDER — LIDOCAINE-EPINEPHRINE (PF) 1.5 %-1:200000 IJ SOLN
INTRAMUSCULAR | Status: DC | PRN
  Administered 2022-07-03: 30 mL via PERINEURAL

## 2022-07-03 MED ORDER — HEPARIN 6000 UNIT IRRIGATION SOLUTION
Status: AC
  Filled 2022-07-03: qty 500

## 2022-07-03 MED ORDER — OXYCODONE HCL 5 MG PO TABS: 5.0000 mg | ORAL_TABLET | Freq: Once | ORAL | Status: DC | PRN

## 2022-07-03 MED ORDER — HEPARIN 6000 UNIT IRRIGATION SOLUTION: Status: DC | PRN

## 2022-07-03 MED ORDER — SODIUM CHLORIDE 0.9 % IV SOLN: INTRAVENOUS | Status: DC

## 2022-07-03 MED ORDER — STERILE WATER FOR IRRIGATION IR SOLN: Status: DC | PRN

## 2022-07-03 MED ORDER — 0.9 % SODIUM CHLORIDE (POUR BTL) OPTIME: TOPICAL | Status: DC | PRN

## 2022-07-03 MED ORDER — FENTANYL CITRATE (PF) 250 MCG/5ML IJ SOLN
INTRAMUSCULAR | Status: DC | PRN
  Administered 2022-07-03: 50 ug via INTRAVENOUS

## 2022-07-03 MED ORDER — OXYCODONE HCL 5 MG/5ML PO SOLN: 5.0000 mg | Freq: Once | ORAL | Status: DC | PRN

## 2022-07-03 MED ORDER — PROPOFOL 10 MG/ML IV BOLUS
INTRAVENOUS | Status: AC
  Filled 2022-07-03: qty 20

## 2022-07-03 MED ORDER — FENTANYL CITRATE (PF) 100 MCG/2ML IJ SOLN: 25.0000 ug | INTRAMUSCULAR | Status: DC | PRN

## 2022-07-03 MED ORDER — PROPOFOL 1000 MG/100ML IV EMUL
INTRAVENOUS | Status: AC
  Filled 2022-07-03: qty 100

## 2022-07-03 MED ORDER — MIDAZOLAM HCL 2 MG/2ML IJ SOLN
INTRAMUSCULAR | Status: AC
  Filled 2022-07-03: qty 2

## 2022-07-03 MED ORDER — LACTATED RINGERS IV SOLN: INTRAVENOUS | Status: DC

## 2022-07-03 MED ORDER — VANCOMYCIN HCL IN DEXTROSE 1-5 GM/200ML-% IV SOLN
1000.0000 mg | INTRAVENOUS | Status: AC
  Administered 2022-07-03: 1000 mg via INTRAVENOUS

## 2022-07-03 MED ORDER — FENTANYL CITRATE (PF) 250 MCG/5ML IJ SOLN
INTRAMUSCULAR | Status: AC
  Filled 2022-07-03: qty 5

## 2022-07-03 MED ORDER — ORAL CARE MOUTH RINSE: 15.0000 mL | Freq: Once | OROMUCOSAL | Status: AC

## 2022-07-03 MED ORDER — PROMETHAZINE HCL 25 MG/ML IJ SOLN
6.2500 mg | INTRAMUSCULAR | Status: DC | PRN
  Administered 2022-07-03: 6.25 mg via INTRAVENOUS

## 2022-07-03 SURGICAL SUPPLY — 37 items
ADH SKN CLS APL DERMABOND .7 (GAUZE/BANDAGES/DRESSINGS) ×1
ARMBAND PINK RESTRICT EXTREMIT (MISCELLANEOUS) ×2 IMPLANT
BAG COUNTER SPONGE SURGICOUNT (BAG) ×2 IMPLANT
BAG SPNG CNTER NS LX DISP (BAG) ×1
BNDG ELASTIC 4X5.8 VLCR STR LF (GAUZE/BANDAGES/DRESSINGS) ×2 IMPLANT
CANISTER SUCT 3000ML PPV (MISCELLANEOUS) ×2 IMPLANT
CLIP LIGATING EXTRA MED SLVR (CLIP) ×2 IMPLANT
CLIP LIGATING EXTRA SM BLUE (MISCELLANEOUS) ×2 IMPLANT
CLIP VESOCCLUDE MED 6/CT (CLIP) ×2 IMPLANT
COVER PROBE W GEL 5X96 (DRAPES) IMPLANT
DERMABOND ADVANCED .7 DNX12 (GAUZE/BANDAGES/DRESSINGS) ×2 IMPLANT
ELECT REM PT RETURN 9FT ADLT (ELECTROSURGICAL) ×1
ELECTRODE REM PT RTRN 9FT ADLT (ELECTROSURGICAL) ×2 IMPLANT
GLOVE BIO SURGEON STRL SZ7.5 (GLOVE) ×2 IMPLANT
GLOVE BIOGEL PI IND STRL 8 (GLOVE) ×2 IMPLANT
GLOVE SRG 8 PF TXTR STRL LF DI (GLOVE) ×2 IMPLANT
GLOVE SURG POLYISO LF SZ8 (GLOVE) IMPLANT
GLOVE SURG UNDER POLY LF SZ8 (GLOVE) ×1
GOWN STRL REUS W/ TWL LRG LVL3 (GOWN DISPOSABLE) ×4 IMPLANT
GOWN STRL REUS W/TWL 2XL LVL3 (GOWN DISPOSABLE) ×4 IMPLANT
GOWN STRL REUS W/TWL LRG LVL3 (GOWN DISPOSABLE) ×2
KIT BASIN OR (CUSTOM PROCEDURE TRAY) ×2 IMPLANT
KIT TURNOVER KIT B (KITS) ×2 IMPLANT
NS IRRIG 1000ML POUR BTL (IV SOLUTION) ×2 IMPLANT
PACK CV ACCESS (CUSTOM PROCEDURE TRAY) ×2 IMPLANT
PAD ARMBOARD 7.5X6 YLW CONV (MISCELLANEOUS) ×4 IMPLANT
SLING ARM FOAM STRAP LRG (SOFTGOODS) IMPLANT
SLING ARM FOAM STRAP MED (SOFTGOODS) IMPLANT
SPIKE FLUID TRANSFER (MISCELLANEOUS) ×2 IMPLANT
SUT MNCRL AB 4-0 PS2 18 (SUTURE) ×2 IMPLANT
SUT PROLENE 6 0 BV (SUTURE) IMPLANT
SUT PROLENE 7 0 BV 1 (SUTURE) ×2 IMPLANT
SUT VIC AB 3-0 SH 27 (SUTURE) ×2
SUT VIC AB 3-0 SH 27X BRD (SUTURE) ×4 IMPLANT
TOWEL GREEN STERILE (TOWEL DISPOSABLE) ×2 IMPLANT
UNDERPAD 30X36 HEAVY ABSORB (UNDERPADS AND DIAPERS) ×2 IMPLANT
WATER STERILE IRR 1000ML POUR (IV SOLUTION) ×2 IMPLANT

## 2022-07-03 NOTE — Progress Notes (Signed)
Pt with SOB s/p block. Concern for phrenic nerve involvement. Unsafe to proceed with surgery - surgery cancelled.   Will continue to observe - admit v home pending clinical improvement.   [name] MD

## 2022-07-03 NOTE — Anesthesia Postprocedure Evaluation (Signed)
Anesthesia Post Note  Patient: Ariel [name]  Procedure(s) Performed: CANCELLED PROCEDURE-Chest pain and wheezing before prepping.     Patient location during evaluation: PACU Anesthesia Type: Regional Level of consciousness: awake and alert Pain management: pain level controlled Vital Signs Assessment: post-procedure vital signs reviewed and stable Respiratory status: spontaneous breathing, nonlabored ventilation, respiratory function stable and patient connected to nasal cannula oxygen Cardiovascular status: stable and blood pressure returned to baseline Postop Assessment: no apparent nausea or vomiting Anesthetic complications: yes (respiratory distress after regional anesthesia) Comments: Patient stable in PACU. All respiratory issues resolved prior to discharge   No notable events documented.  Last Vitals:  Vitals:   07/03/22 1100 07/03/22 1115  BP: (!) 148/70 (!) 149/72  Pulse: 70 73  Resp: (!) 21 (!) 21  Temp:  36.7 C  SpO2: 95% 95%    Last Pain:  Vitals:   07/03/22 1115  TempSrc:   PainSc: 0-No pain                 Jeancarlo Leffler P Havier Deeb

## 2022-07-03 NOTE — H&P (Signed)
POST OPERATIVE OFFICE NOTE   Patient seen and examined in preop holding.  No complaints. No changes to medication history or physical exam since last seen in clinic. After discussing the risks and benefits of right arm fistula superficialization, Ariel [name] elected to proceed.   Ariel John MD  CC:  F/u for surgery  HPI:  This is a 46 y.o. female who is s/p right BC AVF on 05/17/2022 by Dr. [name].    The patient has had multiple prior access procedures on the left upper extremity. Per pt, previous tunneled lines have been placed in bilateral IJ's.  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  She is undergone kidney transplant x2 with the first 1 lasting 8 years, second lasting 15 years.  She currently lives in [place] with her mother and lives on disability due to her illness.   On exam Ariel [name] was doing well, accompanied by her mother.  Her current transplant has started to fail, necessitating long-term HD access.  Pt states she does not have pain/numbness in the right hand.    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  She was hospitalized for 4 days at [facility] and did not find out a cause of her blindness.   The pt is not yet on dialysis.     Allergies  Allergen Reactions   Bactrim [Sulfamethoxazole-Trimethoprim] Anaphylaxis   Sulfamethoxazole Anaphylaxis   Ambien [Zolpidem]     Sleep walking, hallucinations   Hydrogen Peroxide Rash   Labetalol Hives and Rash   Wound Dressing Adhesive Hives   Amlodipine Swelling   Hyoscyamine Other (See Comments)    Dizziness    Keflex [Cephalexin] Hives and Swelling    Per pt, her face and lips swell up and she develops hives   Orphenadrine Hives   Adhesive [Tape] Rash    Can not use for long periods of time (3 days or more)   Imuran [Azathioprine Sodium] Rash   Neosporin [Bacitracin-Polymyxin B] Rash and Other (See Comments)    Can use that for awhile, but  will eventually develop a rash    Tramadol Rash   Warfarin Sodium Rash    Current Facility-Administered Medications  Medication Dose Route Frequency Provider Last Rate Last Admin   0.9 %  sodium chloride infusion   Intravenous Continuous Ariel John, MD   New Bag at 07/03/22 0700   chlorhexidine (HIBICLENS) 4 % liquid 4 Application  60 mL Topical Once Ariel John, MD       And   [START ON 07/04/2022] chlorhexidine (HIBICLENS) 4 % liquid 4 Application  60 mL Topical Once Ariel John, MD       lactated ringers infusion   Intravenous Continuous [name], [name], MD       vancomycin (VANCOCIN) IVPB 1000 mg/200 mL premix  1,000 mg Intravenous 60 min Pre-Op Ariel John, MD   1,000 mg at 07/03/22 0703     ROS:  See HPI  Physical Exam:  Today's Vitals   07/02/22 1231 07/03/22 0617 07/03/22 0637  BP:  (!) 159/84   Pulse:  75   Resp:  14   Temp:  98.2 F (36.8 C)   TempSrc:  Oral   SpO2:  95%   Weight: 82.6 kg 82.6 kg   Height: '4\' 11"'$  (1.499 m) '4\' 11"'$  (1.499 m)   PainSc:   0-No pain   Body mass index is  36.76 kg/m.   Incision:  well healed Extremities:   There is a palpable right radial pulse.   Motor and sensory are in tact.   There is a thrill/bruit present.  Access is  easily palpable at the antecubital fossa then more difficult more proximal   Dialysis Duplex on 06/22/2022: +------------+----------+-------------+----------+--------+  OUTFLOW VEINPSV (cm/s)Diameter (cm)Depth (cm)Describe  +------------+----------+-------------+----------+--------+  Prox UA        138        0.81        1.71             +------------+----------+-------------+----------+--------+  Mid UA          80        0.83        0.76             +------------+----------+-------------+----------+--------+  Dist UA        127        0.89        0.61             +------------+----------+-------------+----------+--------+  AC Fossa       339        0.85         0.42             +------------+----------+-------------+----------+--------+    Assessment/Plan:  This is a 46 y.o. female who is s/p: Right BC AVF 05/17/2022 by Dr. [name]   -the pt does not have evidence of steal. -the fistula has matured nicely, but unfortunately, it is too deep and will need superficialization.  Discussed this with pt and she would like to proceed.  Will schedule on day that is convenient for her and Dr. [name].   -she is not yet on HD and is followed by Dr. Hollie Salk.    Leontine Locket, [facility] [phone]  Clinic MD:  [name]

## 2022-07-03 NOTE — Anesthesia Procedure Notes (Signed)
Anesthesia Regional Block: Supraclavicular block   Pre-Anesthetic Checklist: , timeout performed,  Correct Patient, Correct Site, Correct Laterality,  Correct Procedure, Correct Position, site marked,  Risks and benefits discussed,  Surgical consent,  Pre-op evaluation,  At surgeon's request and post-op pain management  Laterality: Right  Prep: chloraprep       Needles:  Injection technique: Single-shot  Needle Type: Echogenic Stimulator Needle     Needle Length: 9cm  Needle Gauge: 21     Additional Needles:   Procedures:,,,, ultrasound used (permanent image in chart),,    Narrative:  Start time: 07/03/2022 7:15 AM End time: 07/03/2022 7:25 AM Injection made incrementally with aspirations every 5 mL.  Performed by: Personally  Anesthesiologist: Murvin Natal, MD  Additional Notes: Functioning IV was confirmed and monitors were applied.  A timeout was performed. Sterile prep, hand hygiene and sterile gloves were used. A 21m 21ga Arrow echogenic stimulator needle was used. Negative aspiration and negative test dose prior to incremental administration of local anesthetic. The patient tolerated the procedure well.  Ultrasound guidance: relevent anatomy identified, needle position confirmed, local anesthetic spread visualized around nerve(s), vascular puncture avoided.  Image printed for medical record.

## 2022-07-03 NOTE — Transfer of Care (Signed)
Immediate Anesthesia Transfer of Care Note  Patient: Ariel [name]  Procedure(s) Performed: RIGHT ARM FISTULA SUPERFICIALIZATION (Right)  Patient Location: PACU  Anesthesia Type:Regional  Level of Consciousness: awake and drowsy  Airway & Oxygen Therapy: Patient Spontanous Breathing and Patient connected to face mask oxygen  Post-op Assessment: Report given to RN and Post -op Vital signs reviewed and stable  Patient still short of breath and wheezing. SPO2 98% on 10L O2 facemask.   Post vital signs: Reviewed and stable  Last Vitals:  Vitals Value Taken Time  BP 183/84 07/03/22 0756  Temp    Pulse 76 07/03/22 0759  Resp 14 07/03/22 0759  SpO2 98 % 07/03/22 0759  Vitals shown include unvalidated device data.  Last Pain:  Vitals:   07/03/22 0637  TempSrc:   PainSc: 0-No pain         Complications: No notable events documented.

## 2022-07-05 ENCOUNTER — Other Ambulatory Visit: Payer: Self-pay

## 2022-07-05 DIAGNOSIS — N184 Chronic kidney disease, stage 4 (severe): Secondary | ICD-10-CM

## 2022-07-09 DIAGNOSIS — H4710 Unspecified papilledema: Secondary | ICD-10-CM | POA: Diagnosis not present

## 2022-07-16 NOTE — Anesthesia Preprocedure Evaluation (Addendum)
Anesthesia Evaluation  Patient identified by MRN, date of birth, ID band Patient awake    Reviewed: Allergy & Precautions, NPO status , Patient's Chart, lab work & pertinent test results, reviewed documented beta blocker date and time   History of Anesthesia Complications Negative for: history of anesthetic complications  Airway Mallampati: III   Neck ROM: Full    Dental no notable dental hx. (+) Dental Advisory Given   Pulmonary neg pulmonary ROS   Pulmonary exam normal        Cardiovascular hypertension, Pt. on home beta blockers Normal cardiovascular exam  She had cardiology evaluation by Dr. Geraldo Pitter last on 05/09/22 for preoperative evaluation in setting of atypical chest pain. Stress test low risk, EF 65% on 05/15/22.  Nuclear stress test 05/15/22:   The study is normal. The study is low risk.   Left ventricular function is normal. Nuclear stress EF: 64 %. The left ventricular ejection fraction is normal (55-65%). End diastolic cavity size is normal.   Increased uptake of isotope seen in the mid right lung field.  Clinical correlation suggested     Echo 12/21/20: IMPRESSIONS   1. Left ventricular ejection fraction, by estimation, is 60 to 65%. The  left ventricle has normal function. The left ventricle has no regional  wall motion abnormalities. There is severe concentric left ventricular  hypertrophy. Left ventricular diastolic   parameters are consistent with Grade I diastolic dysfunction (impaired  relaxation). The average left ventricular global longitudinal strain is  -9.3 %. The global longitudinal strain is abnormal.   2. Right ventricular systolic function is normal. The right ventricular  size is normal. There is normal pulmonary artery systolic pressure.   3. The mitral valve is degenerative. No evidence of mitral valve  regurgitation. No evidence of mitral stenosis.   4. The aortic valve is tricuspid. Aortic  valve regurgitation is not  visualized. No aortic stenosis is present.   5. The inferior vena cava is normal in size with greater than 50%  respiratory variability, suggesting right atrial pressure of 3 mmHg.       Neuro/Psych negative neurological ROS     GI/Hepatic Neg liver ROS,GERD  ,,  Endo/Other  negative endocrine ROS    Renal/GU CRFRenal disease     Musculoskeletal negative musculoskeletal ROS (+)    Abdominal   Peds  Hematology  (+) Blood dyscrasia, anemia   Anesthesia Other Findings   Reproductive/Obstetrics                             Anesthesia Physical Anesthesia Plan  ASA: 3  Anesthesia Plan: General   Post-op Pain Management: Tylenol PO (pre-op)*   Induction: Intravenous  PONV Risk Score and Plan: 4 or greater and Ondansetron, Dexamethasone, Midazolam and Treatment may vary due to age or medical condition  Airway Management Planned: LMA  Additional Equipment:   Intra-op Plan:   Post-operative Plan: Extubation in OR  Informed Consent: I have reviewed the patients History and Physical, chart, labs and discussed the procedure including the risks, benefits and alternatives for the proposed anesthesia with the patient or authorized representative who has indicated his/her understanding and acceptance.     Dental advisory given  Plan Discussed with: Anesthesiologist and CRNA  Anesthesia Plan Comments: (PAT note written 07/16/2022 by Myra Gianotti, PA-C. Case rescheduled from 07/03/22 after she developed dyspnea following supraclavicular block.   )       Anesthesia Quick Evaluation

## 2022-07-16 NOTE — Progress Notes (Signed)
Anesthesia Chart Review: Ariel [name]  Case: [id number] Date/Time: 07/17/22 0909   Procedure: SUPERFICIALIZATION RIGHT ARM FISTULA (Right) - POSSIBLE PERIPHERAL NERVE BLOCK   Anesthesia type: Choice   Pre-op diagnosis: CKD IV   Location: [facility] OR ROOM 12 / [facility]   Surgeons: [name], MD       DISCUSSION:  Patient is a 46 year old female scheduled for the above procedure. S/p right brachiocephalic AVF 6/56/81. She was scheduled initially for superficialization on 07/03/22, but she developed dyspnea/wheezing following supraclavicular block and there was concern for phrenic nerve involvement. The the case was cancelled, and she was discharged home that same day after respiratory symptoms resolved while recovering in PACU.    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    She had cardiology evaluation by Dr. Geraldo Pitter last on 05/09/22 for preoperative evaluation in setting of atypical chest pain. Stress test low risk, EF 65% on 05/15/22.    [place] admission 06/15/22-06/16/22 for progressive right eye blurriness to vision loss over ~ 3 weeks. Referred to ED by ophthalmologist due to optic nerve swelling with concern for GCA or optic neuritis. Per Discharge Summary, "Upon presentation, ophthalmology was consulted. Their examination was notable for OD APD, decreased color vision. MRI orbits notable for nonspecific T1 hyperintense focus with diffusion  restriction at R optic nerve disc that was nonenhancing. MRA head/neck without concern for LVO/stenosis and MRV without CVST. Infectious/rheumatologic workup notable for unremarkable ESR, CRP, ANCA, RPR, ANA, ACE, with pending results as above. 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 Given this, there was no concern for atypical optic neuritis. She additionally had LP completed 10/14 with mildly elevated opening pressure (29), however given Hx of kidney disease as well as no c/f papilledema on OCT, there was low concern for IIH (further, her unilateral symptoms would be a rare presentation of such). CSF cell counts, protein, and glucose all wnl and thus further reassuring against infectious, inflammatory, demyelinating etiology. Pending CSF studies as above. Plan on discharge to follow up with neuro ophthalmology (already scheduled) and neurology (to be scheduled)." She was started on Vitamin D for vitamin D insufficiency with hypocalcemia and hight PTH levels, Rocaltrol, and Phoslo. Myfortic dose decreased given recent SCC of anus and Tacrolimus increased. Per lab results in [place], since discharge 06/18/22 CSF Oligoclonal Bands resulted as elevated at 3 (normal < 2) which can be seen in MS, but "These findings, however, are not specific for MS  as CSF-specific IgG synthesis may also be found in patients  with other neurologic diseases including infectious,  inflammatory, cerebrovascular, and paraneoplastic  disorders. Clinical correlation recommended." I do not see any follow-up neurology or neuro-ophthalmology office visits yet in Care Everywhere.    Meds include Myfortric, prednisone 5 mg daily, Prograf.  She is a same day work-up, so anesthesia team to evaluate on the day of surgery.    VS: LMP 09/13/2013  BP Readings from Last 3  Encounters:  07/03/22 (!) 149/72  06/22/22 117/77  05/17/22 (!) 155/66   Pulse Readings from Last 3 Encounters:  07/03/22 73  06/22/22 75  05/17/22 87     PROVIDERS: [name], PA-C is PCP  Madelon Lips, MD is nephrologist Revankar, Sunny Schlein, MD is cardiologist Jackquline Denmark, MD is GI  Edison Nasuti, MD is HEM-[facility] Terald Sleeper). Last visit 06/25/22 for follow-up anal HSIL/squamous carcinoma in situ. Skin irritation improved since last ivsit, but some areas that can be excised. Exam under anesthesia with excision of anal lesion is planned for 07/18/22.   LABS: For day of surgery. Most recent results in [facility] include: Lab Results  Component Value Date   WBC 9.4 02/17/2022   HGB 8.8 (L) 07/03/2022   HCT 26.0 (L) 07/03/2022   PLT 196 02/17/2022   GLUCOSE 99 07/03/2022   ALT 9 02/17/2022   AST 11 (L) 02/17/2022   NA 140 07/03/2022   K 4.3 07/03/2022   CL 103 07/03/2022   CREATININE 4.70 (H) 07/03/2022   BUN 33 (H) 07/03/2022   CO2 20 (L) 02/17/2022   INR 0.97 10/03/2013     EGD 02/28/22: IMPRESSIONS: - LA Grade B reflux esophagitis with no bleeding. Biopsied. - Small hiatal hernia. - Non-bleeding gastric ulcer with no stigmata of bleeding. Biopsied. - Normal examined duodenum. Biopsied.    IMAGES: MRA head/neck 06/15/22 ([facility]): IMPRESSION:  No large vessel occlusion or hemodynamically significant stenosis in the  neck. Note the origin of the ophthalmic arteries is patent, although  evaluation is limited on MRA.    MRI Orbits 06/15/22 ([facility]): IMPRESSION:  1.  Nonspecific 3 mm T2 hyperintense focus with diffusion restriction at  the right optic nerve disc. Differential considerations include central  retinal artery occlusion versus atypical optic neuritis .    MRI head with venogram 06/15/22 ([facility]): IMPRESSION:  No evidence of dural venous thrombosis.     CXR 06/14/22 ([facility]: IMPRESSION: 1.  Cardiomediastinal silhouette is mildly enlarged.  2.   Nonspecific diffuse bilateral interstitial prominence. Tiny left  pleural effusion and associated left basilar atelectasis. Right basilar  atelectasis/scarring. No lobar consolidation.  3.  No pneumothorax.  4.  Surgical clips projecting over the neck.     US Thyroid 05/11/22: IMPRESSION: - Nodule 3 (TI-RADS 4), measuring 1.0 cm, located in the inferior left thyroid lobe meets criteria for imaging follow-up. Annual ultrasound surveillance is recommended until 5 years of stability is documented. - The above is in keeping with the ACR TI-RADS recommendations - J Am Coll Radiol 2017;14:587-595.   NM Parathyroid 02/27/22: IMPRESSION: Exam suspicious for a LEFT superior parathyroid adenoma; consider thyroid ultrasound evaluation.   MRI Abd 02/15/22: IMPRESSION: 1. Marked enlargement of the transplanted kidney with surrounding edema and or fluid. The appearance is not substantially changed compared to the very recent CT accounting for differences in technique. There is a rim of fluid signal surrounding this transplanted kidney which is enlarged. Differential considerations include chronically infected RIGHT iliac fossa transplant kidney versus rare complication of renal transplant related lymphangiectasia. Ultimately, assessment by the Renal Transplant Team may be helpful to determine next steps in management. Findings are little changed as far back as March 2023 and actually appear improved when compared to April of 2019. 2. Unusual trauma near complete resolution of visible pancreatic ductal dilation that was seen on the prior study. Transient ductal obstruction is considered but there is no imaging sign of pancreatic inflammation. A  small 5 mm lesion is present in the head of the pancreas for which follow-up in 1 year with MRI/MRCP is suggested. There is a change in abdominal symptoms could consider lipase correlation with imaging earlier as warranted for further evaluation.     EKG: 05/09/22: NSR Prolonged QT (QTc 486 ms)     CV: Nuclear stress test 05/15/22:   The study is normal. The study is low risk.   Left ventricular function is normal. Nuclear stress EF: 64 %. The left ventricular ejection fraction is normal (55-65%). End diastolic cavity size is normal.   Increased uptake of isotope seen in the mid right lung field.  Clinical correlation suggested     Echo 12/21/20: IMPRESSIONS   1. Left ventricular ejection fraction, by estimation, is 60 to 65%. The  left ventricle has normal function. The left ventricle has no regional  wall motion abnormalities. There is severe concentric left ventricular  hypertrophy. Left ventricular diastolic   parameters are consistent with Grade I diastolic dysfunction (impaired  relaxation). The average left ventricular global longitudinal strain is  -9.3 %. The global longitudinal strain is abnormal.   2. Right ventricular systolic function is normal. The right ventricular  size is normal. There is normal pulmonary artery systolic pressure.   3. The mitral valve is degenerative. No evidence of mitral valve  regurgitation. No evidence of mitral stenosis.   4. The aortic valve is tricuspid. Aortic valve regurgitation is not  visualized. No aortic stenosis is present.   5. The inferior vena cava is normal in size with greater than 50%  respiratory variability, suggesting right atrial pressure of 3 mmHg.    Past Medical History:  Diagnosis Date   Abdominal distension (gaseous) 04/16/2018   Abdominal pain 02/14/2022   Abnormal CT scan, gastrointestinal tract    AIN grade II    AIN grade III 04/05/2020   AKI (acute kidney injury) ([place]) 08/18/2021   Anemia    Anemia associated with chronic renal failure    Anorectal disorder 06/08/2020   Anorectal pain 06/08/2020   Anxiety disorder 10/29/2018   Back pain    Bloating 04/16/2018   Cancer ([place])    pre cervical   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    of  the [place]   Chronic kidney disease, stage V ([place]) 81/27/5170   Complications due to cardiac device, implant, and graft 09/02/2012   Condyloma of female genitalia    Dehydration 01/19/2019   Depression    Dialysis patient ([place])    not currently as of 07/02/22   Drug-induced bleeding disorder ([place]) 01/19/2019   DVT of axillary vein, acute left ([place]) 08/02/2012   Dysmenorrhea 10/19/2015   Dyspnea on exertion 01/19/2019   Dysuria    End stage renal disease ([place]) 09/02/2012   ESRD (end stage renal disease) ([place])    sp transplant x 2, did mix of HD and PD from 2001- 2008   Essential hypertension 11/03/2021   Essential hypertension with goal blood pressure less than 130/80    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   Fatigue    Gastritis, acute    GERD without esophagitis 12/20/2021   Gout    History of parathyroidectomy ([place]) 10/29/2018   History of renal transplant    HTN (hypertension) 01/19/2019   Hypercholesterolemia    Hyperlipidemia    Hyperphosphatemia 12/20/2021   Hypokalemia    Hypomagnesemia    Hypothyroid 01/19/2019   IBS (irritable bowel syndrome)    Immunosuppressive  management encounter following kidney transplant 10/29/2018   Internal hemorrhoid 04/22/2018   Iron deficiency anemia 10/19/2015   Irregular uterine bleeding 02/13/2016   Kidney disorder 06/08/2020   Kidney transplant recipient 10/29/2018   1st transplant [facility]) was 1995- 2001.  Did HD/ PD mix from 2001- 2008. 2nd transplant ([facility]/ [facility]) was in 2008 and is still working as of 12/2021. F/b Dr Hollie Salk ([place]) and [facility].   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   Obesity, Class I, BMI 30-34.9 12/21/2021   Other chronic sinusitis 10/29/2018   Palpitations    Pancreatic duct dilated 02/15/2022   Pancreatitis    Perianal lesion 04/22/2018   Phlebitis and thrombophlebitis of other sites 08/02/2012   Plantar fasciitis 07/03/2018   Pre-transplant evaluation for kidney transplant 01/09/2022   Pruritus ani 06/26/2018   Renal failure, chronic 11/03/2021   Dialysis in the past   Right lower quadrant abdominal pain 10/29/2018   [facility] spotted fever    S/p cadaver renal transplant 08/02/2012   Secondary hyperparathyroidism of renal origin [facility])    Secondary hypertension due to renal disease 10/29/2018   Sesamoiditis 07/03/2018   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    Transplanted organ removal status 10/29/2018   Unstable angina ([place]) 01/19/2019   Urinary tract infection    Vulvar dystrophy    Yeast dermatitis 08/14/2018    Past Surgical History:  Procedure Laterality Date   AV FISTULA PLACEMENT     AV FISTULA PLACEMENT Right 05/17/2022   Procedure: RIGHT ARM BRACHIOCEPHALIC ARTERIOVENOUS (AV) FISTULA CREATION;  Surgeon: [name], MD;  Location: [facility] OR;  Service: Vascular;  Laterality: Right;   COLONOSCOPY  11/28/2005   [facility]   COLONOSCOPY  04/13/2016   Dr Orlena Sheldon   ESOPHAGOGASTRODUODENOSCOPY  07/31/2017   Distal esophageal ulcers (likely due to gastroeesphageal reflix-biopsied). Small hiatal hernia. Erosive gastritis.   HEMORRHOID SURGERY     2019, and 04/29/2020   INSERTION OF DIALYSIS CATHETER     KIDNEY TRANSPLANT  1995, 2008   KNEE SURGERY Left 2020   PARATHYROIDECTOMY  2004   Portion on the parathyroid gland transplanted in R forearm   REMOVAL OF A DIALYSIS CATHETER     SIMPLE VULVECTOMY  06/19/2011   Partial simple left   SKIN CANCER EXCISION  2016   Per pt, area removed on scalp showed squamous cell carcinoma   TUBAL LIGATION      UPPER GASTROINTESTINAL ENDOSCOPY     WISDOM TOOTH EXTRACTION      MEDICATIONS: No current facility-administered medications for this encounter.    acetaminophen (TYLENOL) 500 MG tablet   allopurinol (ZYLOPRIM) 100 MG tablet   ALPRAZolam (XANAX) 0.5 MG tablet   aspirin-acetaminophen-caffeine (EXCEDRIN MIGRAINE) 250-250-65 MG tablet   carvedilol (COREG) 25 MG tablet   famotidine (PEPCID) 20 MG tablet   fluticasone (FLONASE) 50 MCG/ACT nasal spray   furosemide (LASIX) 40 MG tablet   gabapentin (NEURONTIN) 300 MG capsule   hydrocortisone (ANUSOL-HC) 2.5 % rectal cream   hydrOXYzine (ATARAX) 25 MG tablet   isosorbide mononitrate (IMDUR) 30 MG  24 hr tablet   magnesium oxide (MAG-OX) 400 (240 Mg) MG tablet   medroxyPROGESTERone (PROVERA) 10 MG tablet   mycophenolate (MYFORTIC) 180 MG EC tablet   nitroGLYCERIN (NITROSTAT) 0.4 MG SL tablet   ondansetron (ZOFRAN-ODT) 4 MG disintegrating tablet   oxyCODONE-acetaminophen (PERCOCET) 5-325 MG tablet   pantoprazole (PROTONIX) 40 MG tablet   Phenylephrine-Acetaminophen (TYLENOL SINUS+HEADACHE) 5-325 MG TABS   Potassium Chloride ER 20 MEQ TBCR   predniSONE (DELTASONE) 5 MG tablet   sertraline (ZOLOFT) 50 MG tablet   sevelamer carbonate (RENVELA) 800 MG tablet   simvastatin (ZOCOR) 5 MG tablet   sodium bicarbonate 650 MG tablet   tacrolimus (PROGRAF) 0.5 MG capsule   traZODone (DESYREL) 100 MG tablet    Myra Gianotti, PA-C Surgical Short Stay/Anesthesiology [facility] Phone [phone] [facility] Phone [phone] 07/16/2022 11:19 AM

## 2022-07-16 NOTE — Progress Notes (Signed)
Unable to reach patient via phone.  Left a detailed message on machine with instructions for DOS.  PCP - [name], PA-C Cardiologist - Dr Einar Pheasant - [facility] Nephrology - Dr Madelon Lips  Chest x-ray - 06/14/22 CE EKG - 05/09/22 Stress Test - 05/15/22 ECHO - 12/21/20 Cardiac Cath - n/a  ICD Pacemaker/Loop - n/a  Sleep Study -  n/a CPAP - none  Anesthesia review: Yes  STOP now taking any Aspirin (unless otherwise instructed by your surgeon), Aleve, Naproxen, Ibuprofen, Motrin, Advil, Goody's, BC's, all herbal medications, fish oil, and all vitamins.

## 2022-07-17 ENCOUNTER — Ambulatory Visit (HOSPITAL_COMMUNITY): Payer: Medicare Other | Admitting: Vascular Surgery

## 2022-07-17 ENCOUNTER — Other Ambulatory Visit: Payer: Self-pay

## 2022-07-17 ENCOUNTER — Ambulatory Visit (HOSPITAL_COMMUNITY)
Admission: RE | Admit: 2022-07-17 | Discharge: 2022-07-17 | Disposition: A | Discharge status: Home / Self Care | Payer: Medicare Other | Source: Ambulatory Visit | Attending: Vascular Surgery | Admitting: Vascular Surgery

## 2022-07-17 ENCOUNTER — Encounter (HOSPITAL_COMMUNITY): Payer: Self-pay | Admitting: Vascular Surgery

## 2022-07-17 ENCOUNTER — Ambulatory Visit (HOSPITAL_BASED_OUTPATIENT_CLINIC_OR_DEPARTMENT_OTHER): Payer: Medicare Other | Admitting: Vascular Surgery

## 2022-07-17 ENCOUNTER — Encounter (HOSPITAL_COMMUNITY)
Admission: RE | Disposition: A | Discharge status: Home / Self Care | Payer: Self-pay | Source: Ambulatory Visit | Attending: Vascular Surgery

## 2022-07-17 DIAGNOSIS — K219 Gastro-esophageal reflux disease without esophagitis: Secondary | ICD-10-CM | POA: Insufficient documentation

## 2022-07-17 DIAGNOSIS — Z85828 Personal history of other malignant neoplasm of skin: Secondary | ICD-10-CM | POA: Insufficient documentation

## 2022-07-17 DIAGNOSIS — E782 Mixed hyperlipidemia: Secondary | ICD-10-CM | POA: Insufficient documentation

## 2022-07-17 DIAGNOSIS — N186 End stage renal disease: Secondary | ICD-10-CM | POA: Insufficient documentation

## 2022-07-17 DIAGNOSIS — N2581 Secondary hyperparathyroidism of renal origin: Secondary | ICD-10-CM | POA: Insufficient documentation

## 2022-07-17 DIAGNOSIS — N185 Chronic kidney disease, stage 5: Secondary | ICD-10-CM | POA: Diagnosis not present

## 2022-07-17 DIAGNOSIS — I120 Hypertensive chronic kidney disease with stage 5 chronic kidney disease or end stage renal disease: Secondary | ICD-10-CM

## 2022-07-17 DIAGNOSIS — Z940 Kidney transplant status: Secondary | ICD-10-CM | POA: Insufficient documentation

## 2022-07-17 DIAGNOSIS — E039 Hypothyroidism, unspecified: Secondary | ICD-10-CM | POA: Insufficient documentation

## 2022-07-17 DIAGNOSIS — N184 Chronic kidney disease, stage 4 (severe): Secondary | ICD-10-CM

## 2022-07-17 DIAGNOSIS — E1122 Type 2 diabetes mellitus with diabetic chronic kidney disease: Secondary | ICD-10-CM | POA: Diagnosis not present

## 2022-07-17 DIAGNOSIS — T829XXA Unspecified complication of cardiac and vascular prosthetic device, implant and graft, initial encounter: Secondary | ICD-10-CM | POA: Diagnosis not present

## 2022-07-17 DIAGNOSIS — Z86718 Personal history of other venous thrombosis and embolism: Secondary | ICD-10-CM | POA: Insufficient documentation

## 2022-07-17 DIAGNOSIS — Z79899 Other long term (current) drug therapy: Secondary | ICD-10-CM | POA: Insufficient documentation

## 2022-07-17 DIAGNOSIS — D631 Anemia in chronic kidney disease: Secondary | ICD-10-CM | POA: Diagnosis not present

## 2022-07-17 HISTORY — PX: FISTULA SUPERFICIALIZATION: SHX6341

## 2022-07-17 LAB — POCT I-STAT, CHEM 8
BUN: 41 mg/dL — ABNORMAL HIGH (ref 6–20)
Calcium, Ion: 0.99 mmol/L — ABNORMAL LOW (ref 1.15–1.40)
Chloride: 107 mmol/L (ref 98–111)
Creatinine, Ser: 3.7 mg/dL — ABNORMAL HIGH (ref 0.44–1.00)
Glucose, Bld: 89 mg/dL (ref 70–99)
HCT: 26 % — ABNORMAL LOW (ref 36.0–46.0)
Hemoglobin: 8.8 g/dL — ABNORMAL LOW (ref 12.0–15.0)
Potassium: 4.6 mmol/L (ref 3.5–5.1)
Sodium: 140 mmol/L (ref 135–145)
TCO2: 26 mmol/L (ref 22–32)

## 2022-07-17 LAB — GLUCOSE, CAPILLARY: Glucose-Capillary: 103 mg/dL — ABNORMAL HIGH (ref 70–99)

## 2022-07-17 SURGERY — FISTULA SUPERFICIALIZATION
Anesthesia: General | Site: Arm Upper | Laterality: Right

## 2022-07-17 MED ORDER — PROPOFOL 10 MG/ML IV BOLUS
INTRAVENOUS | Status: AC
  Filled 2022-07-17: qty 20

## 2022-07-17 MED ORDER — PROPOFOL 10 MG/ML IV BOLUS
INTRAVENOUS | Status: DC | PRN
  Administered 2022-07-17: 120 mg via INTRAVENOUS

## 2022-07-17 MED ORDER — EPHEDRINE SULFATE-NACL 50-0.9 MG/10ML-% IV SOSY
PREFILLED_SYRINGE | INTRAVENOUS | Status: DC | PRN
  Administered 2022-07-17: 5 mg via INTRAVENOUS

## 2022-07-17 MED ORDER — CHLORHEXIDINE GLUCONATE 4 % EX LIQD: 60.0000 mL | Freq: Once | CUTANEOUS | Status: DC

## 2022-07-17 MED ORDER — HYDROCODONE-ACETAMINOPHEN 5-325 MG PO TABS: 1.0000 | ORAL_TABLET | Freq: Four times a day (QID) | ORAL | 0 refills | Status: DC | PRN

## 2022-07-17 MED ORDER — FENTANYL CITRATE (PF) 250 MCG/5ML IJ SOLN
INTRAMUSCULAR | Status: DC | PRN
  Administered 2022-07-17: 100 ug via INTRAVENOUS
  Administered 2022-07-17: 50 ug via INTRAVENOUS

## 2022-07-17 MED ORDER — DEXAMETHASONE SODIUM PHOSPHATE 10 MG/ML IJ SOLN
INTRAMUSCULAR | Status: DC | PRN
  Administered 2022-07-17: 4 mg via INTRAVENOUS

## 2022-07-17 MED ORDER — CARVEDILOL 12.5 MG PO TABS
25.0000 mg | ORAL_TABLET | Freq: Once | ORAL | Status: AC
  Administered 2022-07-17: 25 mg via ORAL
  Filled 2022-07-17: qty 2

## 2022-07-17 MED ORDER — MIDAZOLAM HCL 2 MG/2ML IJ SOLN
INTRAMUSCULAR | Status: AC
  Filled 2022-07-17: qty 2

## 2022-07-17 MED ORDER — PHENYLEPHRINE HCL-NACL 20-0.9 MG/250ML-% IV SOLN
INTRAVENOUS | Status: DC | PRN
  Administered 2022-07-17: 25 ug/min via INTRAVENOUS

## 2022-07-17 MED ORDER — ONDANSETRON HCL 4 MG/2ML IJ SOLN
INTRAMUSCULAR | Status: AC
  Filled 2022-07-17: qty 2

## 2022-07-17 MED ORDER — 0.9 % SODIUM CHLORIDE (POUR BTL) OPTIME
TOPICAL | Status: DC | PRN
  Administered 2022-07-17: 1000 mL

## 2022-07-17 MED ORDER — ACETAMINOPHEN 500 MG PO TABS
1000.0000 mg | ORAL_TABLET | Freq: Once | ORAL | Status: AC
  Administered 2022-07-17: 500 mg via ORAL
  Filled 2022-07-17: qty 2

## 2022-07-17 MED ORDER — HEPARIN 6000 UNIT IRRIGATION SOLUTION
Status: AC
  Filled 2022-07-17: qty 500

## 2022-07-17 MED ORDER — FENTANYL CITRATE (PF) 100 MCG/2ML IJ SOLN: 25.0000 ug | INTRAMUSCULAR | Status: DC | PRN

## 2022-07-17 MED ORDER — PROMETHAZINE HCL 25 MG/ML IJ SOLN: 6.2500 mg | INTRAMUSCULAR | Status: DC | PRN

## 2022-07-17 MED ORDER — LIDOCAINE 2% (20 MG/ML) 5 ML SYRINGE
INTRAMUSCULAR | Status: DC | PRN
  Administered 2022-07-17: 100 mg via INTRAVENOUS

## 2022-07-17 MED ORDER — FENTANYL CITRATE (PF) 250 MCG/5ML IJ SOLN
INTRAMUSCULAR | Status: AC
  Filled 2022-07-17: qty 5

## 2022-07-17 MED ORDER — DEXAMETHASONE SODIUM PHOSPHATE 10 MG/ML IJ SOLN
INTRAMUSCULAR | Status: AC
  Filled 2022-07-17: qty 1

## 2022-07-17 MED ORDER — ONDANSETRON HCL 4 MG/2ML IJ SOLN
INTRAMUSCULAR | Status: DC | PRN
  Administered 2022-07-17: 4 mg via INTRAVENOUS

## 2022-07-17 MED ORDER — CHLORHEXIDINE GLUCONATE 0.12 % MT SOLN
15.0000 mL | Freq: Once | OROMUCOSAL | Status: AC
  Administered 2022-07-17: 15 mL via OROMUCOSAL
  Filled 2022-07-17: qty 15

## 2022-07-17 MED ORDER — ORAL CARE MOUTH RINSE: 15.0000 mL | Freq: Once | OROMUCOSAL | Status: AC

## 2022-07-17 MED ORDER — SODIUM CHLORIDE 0.9 % IV SOLN: INTRAVENOUS | Status: DC

## 2022-07-17 MED ORDER — HEPARIN 6000 UNIT IRRIGATION SOLUTION
Status: DC | PRN
  Administered 2022-07-17: 1

## 2022-07-17 MED ORDER — VANCOMYCIN HCL IN DEXTROSE 1-5 GM/200ML-% IV SOLN
1000.0000 mg | INTRAVENOUS | Status: AC
  Administered 2022-07-17: 1000 mg via INTRAVENOUS
  Filled 2022-07-17: qty 200

## 2022-07-17 MED ORDER — AMISULPRIDE (ANTIEMETIC) 5 MG/2ML IV SOLN: 10.0000 mg | Freq: Once | INTRAVENOUS | Status: DC | PRN

## 2022-07-17 SURGICAL SUPPLY — 38 items
ADH SKN CLS APL DERMABOND .7 (GAUZE/BANDAGES/DRESSINGS) ×1
ARMBAND PINK RESTRICT EXTREMIT (MISCELLANEOUS) ×1 IMPLANT
BAG COUNTER SPONGE SURGICOUNT (BAG) ×1 IMPLANT
BAG SPNG CNTER NS LX DISP (BAG) ×1
BNDG ELASTIC 4X5.8 VLCR STR LF (GAUZE/BANDAGES/DRESSINGS) ×1 IMPLANT
CANISTER SUCT 3000ML PPV (MISCELLANEOUS) ×1 IMPLANT
CLIP LIGATING EXTRA MED SLVR (CLIP) ×1 IMPLANT
CLIP LIGATING EXTRA SM BLUE (MISCELLANEOUS) ×1 IMPLANT
CLIP VESOCCLUDE MED 6/CT (CLIP) ×1 IMPLANT
COVER PROBE W GEL 5X96 (DRAPES) IMPLANT
DERMABOND ADVANCED .7 DNX12 (GAUZE/BANDAGES/DRESSINGS) ×1 IMPLANT
ELECT REM PT RETURN 9FT ADLT (ELECTROSURGICAL) ×1
ELECTRODE REM PT RTRN 9FT ADLT (ELECTROSURGICAL) ×1 IMPLANT
GLOVE BIO SURGEON STRL SZ7.5 (GLOVE) ×1 IMPLANT
GLOVE BIOGEL PI IND STRL 8 (GLOVE) ×1 IMPLANT
GLOVE SRG 8 PF TXTR STRL LF DI (GLOVE) ×1 IMPLANT
GLOVE SURG POLYISO LF SZ8 (GLOVE) IMPLANT
GLOVE SURG UNDER POLY LF SZ8 (GLOVE) ×1
GOWN STRL REUS W/ TWL LRG LVL3 (GOWN DISPOSABLE) ×2 IMPLANT
GOWN STRL REUS W/TWL 2XL LVL3 (GOWN DISPOSABLE) ×2 IMPLANT
GOWN STRL REUS W/TWL LRG LVL3 (GOWN DISPOSABLE) ×2
KIT BASIN OR (CUSTOM PROCEDURE TRAY) ×1 IMPLANT
KIT TURNOVER KIT B (KITS) ×1 IMPLANT
LOOP VESSEL MINI RED (MISCELLANEOUS) IMPLANT
NS IRRIG 1000ML POUR BTL (IV SOLUTION) ×1 IMPLANT
PACK CV ACCESS (CUSTOM PROCEDURE TRAY) ×1 IMPLANT
PAD ARMBOARD 7.5X6 YLW CONV (MISCELLANEOUS) ×2 IMPLANT
SLING ARM FOAM STRAP LRG (SOFTGOODS) IMPLANT
SLING ARM FOAM STRAP MED (SOFTGOODS) IMPLANT
SPIKE FLUID TRANSFER (MISCELLANEOUS) ×1 IMPLANT
SUT MNCRL AB 4-0 PS2 18 (SUTURE) ×1 IMPLANT
SUT PROLENE 6 0 BV (SUTURE) IMPLANT
SUT PROLENE 7 0 BV 1 (SUTURE) ×1 IMPLANT
SUT VIC AB 3-0 SH 27 (SUTURE) ×5
SUT VIC AB 3-0 SH 27X BRD (SUTURE) ×2 IMPLANT
TOWEL GREEN STERILE (TOWEL DISPOSABLE) ×1 IMPLANT
UNDERPAD 30X36 HEAVY ABSORB (UNDERPADS AND DIAPERS) ×1 IMPLANT
WATER STERILE IRR 1000ML POUR (IV SOLUTION) ×1 IMPLANT

## 2022-07-17 NOTE — H&P (Signed)
POST OPERATIVE OFFICE NOTE   Patient seen and examined in preop holding.  No complaints. No changes to medication history or physical exam since last seen.  After discussing the risks and benefits of right arm fistula superficialization, Batina Dawn Tomassi elected to proceed.   [name] MD  CC:  F/u for surgery  HPI:  This is a 46 y.o. female who is s/p right BC AVF on 05/17/2022 by Dr. [name].    The patient has had multiple prior access procedures on the left upper extremity. Per pt, previous tunneled lines have been placed in bilateral IJ's.  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  She is undergone kidney transplant x2 with the first 1 lasting 8 years, second lasting 15 years.  She currently lives in [place] with her mother and lives on disability due to her illness.   On exam Terriyah was doing well, accompanied by her mother.  Her current transplant has started to fail, necessitating long-term HD access.  Pt states she does not have pain/numbness in the right hand.    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  She was hospitalized for 4 days at [facility] and did not find out a cause of her blindness.   The pt is not yet on dialysis.     Allergies  Allergen Reactions   Bactrim [Sulfamethoxazole-Trimethoprim] Anaphylaxis   Sulfamethoxazole Anaphylaxis   Ambien [Zolpidem]     Sleep walking, hallucinations   Hydrogen Peroxide Rash   Labetalol Hives and Rash   Wound Dressing Adhesive Hives   Amlodipine Swelling   Hyoscyamine Other (See Comments)    Dizziness    Keflex [Cephalexin] Hives and Swelling    Per pt, her face and lips swell up and she develops hives   Orphenadrine Hives   Adhesive [Tape] Rash    Can not use for long periods of time (3 days or more)   Imuran [Azathioprine Sodium] Rash   Neosporin [Bacitracin-Polymyxin B] Rash and Other (See Comments)    Can use that for awhile, but will  eventually develop a rash    Tramadol Rash   Warfarin Sodium Rash    No current facility-administered medications for this encounter.     ROS:  See HPI  Physical Exam:  Today's Vitals   07/17/22 0656  BP: (!) 156/93  Pulse: 86  Resp: 20  Temp: 98.2 F (36.8 C)  TempSrc: Oral  SpO2: 97%  Weight: 79.4 kg  Height: '4\' 11"'$  (1.499 m)   Body mass index is 35.35 kg/m.   Incision:  well healed Extremities:   There is a palpable right radial pulse.   Motor and sensory are in tact.   There is a thrill/bruit present.  Access is  easily palpable at the antecubital fossa then more difficult more proximal   Dialysis Duplex on 06/22/2022: +------------+----------+-------------+----------+--------+  OUTFLOW VEINPSV (cm/s)Diameter (cm)Depth (cm)Describe  +------------+----------+-------------+----------+--------+  Prox UA        138        0.81        1.71             +------------+----------+-------------+----------+--------+  Mid UA          80        0.83        0.76             +------------+----------+-------------+----------+--------+  Dist UA  127        0.89        0.61             +------------+----------+-------------+----------+--------+  AC Fossa       339        0.85        0.42             +------------+----------+-------------+----------+--------+    Assessment/Plan:  This is a 47 y.o. female who is s/p: Right BC AVF 05/17/2022 by Dr. [name]   -the pt does not have evidence of steal. -the fistula has matured nicely, but unfortunately, it is too deep and will need superficialization.  Discussed this with pt and she would like to proceed.  Will schedule on day that is convenient for her and Dr. [name].   -she is not yet on HD and is followed by Dr. Hollie Salk.    Leontine Locket, [facility] [phone]  Clinic MD:  [name]

## 2022-07-17 NOTE — Transfer of Care (Signed)
Immediate Anesthesia Transfer of Care Note  Patient: Ariel [name]  Procedure(s) Performed: SUPERFICIALIZATION AND REVISION RIGHT ARM FISTULA (Right: Arm Upper)  Patient Location: PACU  Anesthesia Type:General  Level of Consciousness: awake and alert   Airway & Oxygen Therapy: Patient Spontanous Breathing  Post-op Assessment: Report given to RN and Post -op Vital signs reviewed and stable  Post vital signs: Reviewed and stable  Last Vitals:  Vitals Value Taken Time  BP 152/88 07/17/22 1200  Temp 36.8 C 07/17/22 1200  Pulse 88 07/17/22 1207  Resp 15 07/17/22 1207  SpO2 91 % 07/17/22 1207  Vitals shown include unvalidated device data.  Last Pain:  Vitals:   07/17/22 0823  TempSrc:   PainSc: 0-No pain         Complications: No notable events documented.

## 2022-07-17 NOTE — Anesthesia Procedure Notes (Signed)
Procedure Name: LMA Insertion Date/Time: 07/17/2022 10:48 AM  Performed by: Bryson Corona, CRNAPre-anesthesia Checklist: Patient identified, Emergency Drugs available, Suction available and Patient being monitored Patient Re-evaluated:Patient Re-evaluated prior to induction Oxygen Delivery Method: Circle System Utilized Preoxygenation: Pre-oxygenation with 100% oxygen Induction Type: IV induction Ventilation: Mask ventilation without difficulty LMA: LMA with gastric port inserted LMA Size: 4.0 Number of attempts: 1 Airway Equipment and Method: Bite block Placement Confirmation: positive ETCO2 Tube secured with: Tape Dental Injury: Teeth and Oropharynx as per pre-operative assessment  Comments: Regular size 4 LMA did not seat well. Changed out for size 4 LMA with gastric tube and worked well.

## 2022-07-17 NOTE — Discharge Instructions (Signed)
° °  [facility] [place] ° °Discharge Instructions ° °AV Fistula or Graft Surgery for Dialysis Access ° °Please refer to the following instructions for your post-procedure care. Your surgeon or physician assistant will discuss any changes with you. ° °Activity ° °You may drive the day following your surgery, if you are comfortable and no longer taking prescription pain medication. Resume full activity as the soreness in your incision resolves. ° °Bathing/Showering ° °You may shower after you go home. Keep your incision dry for 48 hours. Do not soak in a bathtub, hot tub, or swim until the incision heals completely. You may not shower if you have a hemodialysis catheter. ° °Incision Care ° °Clean your incision with mild soap and water after 48 hours. Pat the area dry with a clean towel. You do not need a bandage unless otherwise instructed. Do not apply any ointments or creams to your incision. You may have skin glue on your incision. Do not peel it off. It will come off on its own in about one week. Your arm may swell a bit after surgery. To reduce swelling use pillows to elevate your arm so it is above your heart. Your doctor will tell you if you need to lightly wrap your arm with an ACE bandage. ° °Diet ° °Resume your normal diet. There are not special food restrictions following this procedure. In order to heal from your surgery, it is CRITICAL to get adequate nutrition. Your body requires vitamins, minerals, and protein. Vegetables are the best source of vitamins and minerals. Vegetables also provide the perfect balance of protein. Processed food has little nutritional value, so try to avoid this. ° °Medications ° °Resume taking all of your medications. If your incision is causing pain, you may take over-the counter pain relievers such as acetaminophen (Tylenol). If you were prescribed a stronger pain medication, please be aware these medications can cause nausea and constipation. Prevent  nausea by taking the medication with a snack or meal. Avoid constipation by drinking plenty of fluids and eating foods with high amount of fiber, such as fruits, vegetables, and grains. Do not take Tylenol if you are taking prescription pain medications. ° ° ° ° °Follow up °Your surgeon may want to see you in the office following your access surgery. If so, this will be arranged at the time of your surgery. ° °Please call us immediately for any of the following conditions: ° °Increased pain, redness, drainage (pus) from your incision site °Fever of 101 degrees or higher °Severe or worsening pain at your incision site °Hand pain or numbness. ° °Reduce your risk of vascular disease: ° °Stop smoking. If you would like help, call [facility] at 1-800-QUIT-NOW (1-800-784-8669) or [facility] at 336-586-4000 ° °Manage your cholesterol °Maintain a desired weight °Control your diabetes °Keep your blood pressure down ° °Dialysis ° °It will take several weeks to several months for your new dialysis access to be ready for use. Your surgeon will determine when it is OK to use it. Your nephrologist will continue to direct your dialysis. You can continue to use your Permcath until your new access is ready for use. ° °If you have any questions, please call the office at [phone]. ° °

## 2022-07-17 NOTE — Op Note (Signed)
    NAME: Ariel [name]    MRN: [id number] DOB: 08/24/1976    DATE OF OPERATION: 07/17/2022   PREOP DIAGNOSIS:   End-stage renal disease  POSTOP DIAGNOSIS:    Same  PROCEDURE:    Right arm brachiocephalic fistula revision-fistula superficialization  SURGEON: [name]  ASSIST: Dagoberto Ligas, PA  ANESTHESIA: General  EBL: 30 mL  INDICATIONS:    Ariel [name] is a 46 y.o. female status post right brachiocephalic AV fistula creation on 05/17/2022.  At her last visit, ultrasound demonstrated sufficient size and flow, however there was significant depth.  After discussing the risk and benefits of fistula revision, superficialization, Ariel [name] elected to proceed.  FINDINGS:   8 millimeter brachiocephalic fistula  TECHNIQUE:   Patient was brought to the OR and laid in supine position.  General anesthesia was induced and the patient was prepped and draped in standard fashion  Case began with ultrasound insonation of the right-sided brachiocephalic fistula.  The path of the fistula was marked and 2 longitudinal skip incisions were made on the right bicep.  The incisions were taken down to the brachiocephalic fistula which was mobilized.  Next, the subcutaneous tissue plane was made less than 5 mm from skin between the dermis and subcutaneous fat.  The subcutaneous fat was opposed to the opposite side using 2.0 vicryl, creating a bedding for the fistula.  Hemostasis was achieved with the use of cautery and suture.  The subcutaneous flap was closed using 3-0 Vicryl and 4-0 Monocryl at the skin.  There was a palpable thrill in the fistula at case completion with excellent pulse in the wrist.    Given the complexity of the case,  the assistant was necessary in order to expedient the procedure and safely perform the technical aspects of the operation.  The assistant provided traction and countertraction to assist with exposure of the artery and vein.  They also assisted  with suture ligation of multiple venous branches.  They played a critical role in the anastomosis. These skills, especially following the Prolene suture for the anastomosis, could not have been adequately performed by a scrub tech assistant.   Ariel Burows, MD [facility] DATE OF DICTATION:   07/17/2022

## 2022-07-17 NOTE — Anesthesia Postprocedure Evaluation (Signed)
Anesthesia Post Note  Patient: [employer]chant [employer] officer  Procedure(s) Performed: SUPERFICIALIZATION AND REVISION RIGHT ARM FISTULA (Right: Arm Upper)     Patient location during evaluation: PACU Anesthesia Type: MAC Level of consciousness: awake and alert Pain management: pain level controlled Vital Signs Assessment: post-procedure vital signs reviewed and stable Respiratory status: spontaneous breathing and respiratory function stable Cardiovascular status: stable Postop Assessment: no apparent nausea or vomiting Anesthetic complications: no  No notable events documented.  Last Vitals:  Vitals:   07/17/22 1215 07/17/22 1230  BP: (!) 152/88 132/82  Pulse: 85 83  Resp: 15 13  Temp:  36.8 C  SpO2: 94% 93%    Last Pain:  Vitals:   07/17/22 1230  TempSrc:   PainSc: 5                  Neala Miggins DANIEL

## 2022-07-18 ENCOUNTER — Encounter (HOSPITAL_COMMUNITY): Payer: Self-pay | Admitting: Vascular Surgery

## 2022-07-27 DIAGNOSIS — H6691 Otitis media, unspecified, right ear: Secondary | ICD-10-CM | POA: Diagnosis not present

## 2022-07-27 DIAGNOSIS — R42 Dizziness and giddiness: Secondary | ICD-10-CM | POA: Diagnosis not present

## 2022-07-30 DIAGNOSIS — N184 Chronic kidney disease, stage 4 (severe): Secondary | ICD-10-CM | POA: Diagnosis not present

## 2022-07-30 DIAGNOSIS — D631 Anemia in chronic kidney disease: Secondary | ICD-10-CM | POA: Diagnosis not present

## 2022-08-01 DIAGNOSIS — H5461 Unqualified visual loss, right eye, normal vision left eye: Secondary | ICD-10-CM | POA: Diagnosis not present

## 2022-08-01 DIAGNOSIS — H53481 Generalized contraction of visual field, right eye: Secondary | ICD-10-CM | POA: Diagnosis not present

## 2022-08-02 DIAGNOSIS — R059 Cough, unspecified: Secondary | ICD-10-CM | POA: Diagnosis not present

## 2022-08-02 DIAGNOSIS — M791 Myalgia, unspecified site: Secondary | ICD-10-CM | POA: Diagnosis not present

## 2022-08-02 DIAGNOSIS — H6501 Acute serous otitis media, right ear: Secondary | ICD-10-CM | POA: Diagnosis not present

## 2022-08-02 DIAGNOSIS — J309 Allergic rhinitis, unspecified: Secondary | ICD-10-CM | POA: Diagnosis not present

## 2022-08-03 DIAGNOSIS — N186 End stage renal disease: Secondary | ICD-10-CM | POA: Diagnosis not present

## 2022-08-03 DIAGNOSIS — Z992 Dependence on renal dialysis: Secondary | ICD-10-CM | POA: Diagnosis not present

## 2022-08-03 DIAGNOSIS — T8610 Unspecified complication of kidney transplant: Secondary | ICD-10-CM | POA: Diagnosis not present

## 2022-08-04 DIAGNOSIS — J9811 Atelectasis: Secondary | ICD-10-CM | POA: Diagnosis not present

## 2022-08-04 DIAGNOSIS — J189 Pneumonia, unspecified organism: Secondary | ICD-10-CM | POA: Diagnosis not present

## 2022-08-04 DIAGNOSIS — R0602 Shortness of breath: Secondary | ICD-10-CM | POA: Diagnosis not present

## 2022-08-04 DIAGNOSIS — N189 Chronic kidney disease, unspecified: Secondary | ICD-10-CM | POA: Diagnosis not present

## 2022-08-04 DIAGNOSIS — I10 Essential (primary) hypertension: Secondary | ICD-10-CM | POA: Diagnosis not present

## 2022-08-04 DIAGNOSIS — Z79899 Other long term (current) drug therapy: Secondary | ICD-10-CM | POA: Diagnosis not present

## 2022-08-04 DIAGNOSIS — Z7952 Long term (current) use of systemic steroids: Secondary | ICD-10-CM | POA: Diagnosis not present

## 2022-08-04 DIAGNOSIS — R9431 Abnormal electrocardiogram [ECG] [EKG]: Secondary | ICD-10-CM | POA: Diagnosis not present

## 2022-08-07 DIAGNOSIS — G4710 Hypersomnia, unspecified: Secondary | ICD-10-CM | POA: Diagnosis not present

## 2022-08-07 DIAGNOSIS — J189 Pneumonia, unspecified organism: Secondary | ICD-10-CM | POA: Diagnosis not present

## 2022-08-07 DIAGNOSIS — N184 Chronic kidney disease, stage 4 (severe): Secondary | ICD-10-CM | POA: Diagnosis not present

## 2022-08-07 DIAGNOSIS — G4700 Insomnia, unspecified: Secondary | ICD-10-CM | POA: Diagnosis not present

## 2022-08-07 DIAGNOSIS — Z6837 Body mass index (BMI) 37.0-37.9, adult: Secondary | ICD-10-CM | POA: Diagnosis not present

## 2022-08-07 DIAGNOSIS — H469 Unspecified optic neuritis: Secondary | ICD-10-CM | POA: Diagnosis not present

## 2022-08-10 ENCOUNTER — Ambulatory Visit (INDEPENDENT_AMBULATORY_CARE_PROVIDER_SITE_OTHER): Payer: Medicare Other | Admitting: Physician Assistant

## 2022-08-10 VITALS — BP 175/96 | HR 74 | Temp 98.0°F | Resp 20 | Ht 59.0 in | Wt 185.5 lb

## 2022-08-10 DIAGNOSIS — N184 Chronic kidney disease, stage 4 (severe): Secondary | ICD-10-CM

## 2022-08-10 DIAGNOSIS — Z940 Kidney transplant status: Secondary | ICD-10-CM | POA: Diagnosis not present

## 2022-08-10 NOTE — Progress Notes (Signed)
    Postoperative Access Visit   History of Present Illness   Ariel [name] is a 46 y.o. year old female who presents for postoperative follow-up for: right  superficialization of brachiocephalic fistula by Dr. [name] on 07/17/2022  The patient's wounds are healed.  The patient denies steal symptoms.  The patient is able to complete their activities of daily living.  She is not on hemodialysis.  CKD is managed by Dr. Hollie Salk.   Physical Examination   Vitals:   08/10/22 1445  BP: (!) 175/96  Pulse: 74  Resp: 20  Temp: 98 F (36.7 C)  TempSrc: Temporal  SpO2: 96%  Weight: 185 lb 8 oz (84.1 kg)  Height: '4\' 11"'$  (1.499 m)   Body mass index is 37.47 kg/m.  right arm Incisions are healed, palpable 1+ radial pulse, hand grip is 5/5, sensation in digits is intact, palpable thrill, bruit can be auscultated     Medical Decision Making   Val Farnam is a 46 y.o. year old female who presents s/p right  superficialization of brachiocephalic fistula  Patent brachiocephalic fistula without signs or symptoms of steal syndrome The patient's access will be ready for use 08/14/2022 The patient may follow up on a prn basis   Dagoberto Ligas PA-C [facility] [place] Office: [phone]  Clinic MD: [name]

## 2022-08-13 DIAGNOSIS — H9201 Otalgia, right ear: Secondary | ICD-10-CM | POA: Diagnosis not present

## 2022-08-13 DIAGNOSIS — H66003 Acute suppurative otitis media without spontaneous rupture of ear drum, bilateral: Secondary | ICD-10-CM | POA: Diagnosis not present

## 2022-08-13 DIAGNOSIS — H9193 Unspecified hearing loss, bilateral: Secondary | ICD-10-CM | POA: Diagnosis not present

## 2022-08-15 DIAGNOSIS — D631 Anemia in chronic kidney disease: Secondary | ICD-10-CM | POA: Diagnosis not present

## 2022-08-15 DIAGNOSIS — N189 Chronic kidney disease, unspecified: Secondary | ICD-10-CM | POA: Diagnosis not present

## 2022-08-15 DIAGNOSIS — D071 Carcinoma in situ of vulva: Secondary | ICD-10-CM | POA: Diagnosis not present

## 2022-08-15 DIAGNOSIS — I770 Arteriovenous fistula, acquired: Secondary | ICD-10-CM | POA: Diagnosis not present

## 2022-08-15 DIAGNOSIS — I10 Essential (primary) hypertension: Secondary | ICD-10-CM | POA: Diagnosis not present

## 2022-08-15 DIAGNOSIS — D013 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: Secondary | ICD-10-CM | POA: Diagnosis not present

## 2022-08-15 DIAGNOSIS — Z940 Kidney transplant status: Secondary | ICD-10-CM | POA: Diagnosis not present

## 2022-08-20 DIAGNOSIS — H61303 Acquired stenosis of external ear canal, unspecified, bilateral: Secondary | ICD-10-CM | POA: Diagnosis not present

## 2022-08-20 DIAGNOSIS — H6693 Otitis media, unspecified, bilateral: Secondary | ICD-10-CM | POA: Diagnosis not present

## 2022-08-21 DIAGNOSIS — I129 Hypertensive chronic kidney disease with stage 1 through stage 4 chronic kidney disease, or unspecified chronic kidney disease: Secondary | ICD-10-CM

## 2022-08-21 DIAGNOSIS — Z992 Dependence on renal dialysis: Secondary | ICD-10-CM | POA: Insufficient documentation

## 2022-08-21 DIAGNOSIS — Z79621 Long term (current) use of calcineurin inhibitor: Secondary | ICD-10-CM

## 2022-08-21 DIAGNOSIS — N189 Chronic kidney disease, unspecified: Secondary | ICD-10-CM

## 2022-08-21 DIAGNOSIS — M1030 Gout due to renal impairment, unspecified site: Secondary | ICD-10-CM

## 2022-08-21 DIAGNOSIS — D631 Anemia in chronic kidney disease: Secondary | ICD-10-CM

## 2022-08-21 DIAGNOSIS — Z79624 Long term (current) use of inhibitors of nucleotide synthesis: Secondary | ICD-10-CM | POA: Insufficient documentation

## 2022-08-21 HISTORY — DX: Hypertensive chronic kidney disease with stage 1 through stage 4 chronic kidney disease, or unspecified chronic kidney disease: I12.9

## 2022-08-21 HISTORY — DX: Dependence on renal dialysis: Z99.2

## 2022-08-21 HISTORY — DX: Gout due to renal impairment, unspecified site: M10.30

## 2022-08-21 HISTORY — DX: Long term (current) use of calcineurin inhibitor: Z79.621

## 2022-08-21 HISTORY — DX: Anemia in chronic kidney disease: N18.9

## 2022-08-21 HISTORY — DX: Long term (current) use of inhibitors of nucleotide synthesis: Z79.624

## 2022-08-22 DIAGNOSIS — R85613 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): Secondary | ICD-10-CM | POA: Insufficient documentation

## 2022-08-22 DIAGNOSIS — D013 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: Secondary | ICD-10-CM | POA: Diagnosis not present

## 2022-08-24 DIAGNOSIS — H4710 Unspecified papilledema: Secondary | ICD-10-CM | POA: Diagnosis not present

## 2022-08-28 DIAGNOSIS — T782XXA Anaphylactic shock, unspecified, initial encounter: Secondary | ICD-10-CM

## 2022-08-28 DIAGNOSIS — R52 Pain, unspecified: Secondary | ICD-10-CM

## 2022-08-28 DIAGNOSIS — D631 Anemia in chronic kidney disease: Secondary | ICD-10-CM | POA: Diagnosis not present

## 2022-08-28 DIAGNOSIS — T7840XA Allergy, unspecified, initial encounter: Secondary | ICD-10-CM | POA: Insufficient documentation

## 2022-08-28 DIAGNOSIS — N184 Chronic kidney disease, stage 4 (severe): Secondary | ICD-10-CM | POA: Diagnosis not present

## 2022-08-28 HISTORY — DX: Allergy, unspecified, initial encounter: T78.40XA

## 2022-08-28 HISTORY — DX: Anaphylactic shock, unspecified, initial encounter: T78.2XXA

## 2022-08-28 HISTORY — DX: Pain, unspecified: R52

## 2022-08-29 DIAGNOSIS — N2581 Secondary hyperparathyroidism of renal origin: Secondary | ICD-10-CM | POA: Diagnosis not present

## 2022-08-29 DIAGNOSIS — N186 End stage renal disease: Secondary | ICD-10-CM | POA: Diagnosis not present

## 2022-08-29 DIAGNOSIS — Z992 Dependence on renal dialysis: Secondary | ICD-10-CM | POA: Diagnosis not present

## 2022-08-31 DIAGNOSIS — Z992 Dependence on renal dialysis: Secondary | ICD-10-CM | POA: Diagnosis not present

## 2022-08-31 DIAGNOSIS — N186 End stage renal disease: Secondary | ICD-10-CM | POA: Diagnosis not present

## 2022-08-31 DIAGNOSIS — N2581 Secondary hyperparathyroidism of renal origin: Secondary | ICD-10-CM | POA: Diagnosis not present

## 2022-09-02 DIAGNOSIS — Z992 Dependence on renal dialysis: Secondary | ICD-10-CM | POA: Diagnosis not present

## 2022-09-02 DIAGNOSIS — N186 End stage renal disease: Secondary | ICD-10-CM | POA: Diagnosis not present

## 2022-09-02 DIAGNOSIS — N2581 Secondary hyperparathyroidism of renal origin: Secondary | ICD-10-CM | POA: Diagnosis not present

## 2022-09-03 DIAGNOSIS — Z992 Dependence on renal dialysis: Secondary | ICD-10-CM | POA: Diagnosis not present

## 2022-09-03 DIAGNOSIS — N186 End stage renal disease: Secondary | ICD-10-CM | POA: Diagnosis not present

## 2022-09-03 DIAGNOSIS — T8610 Unspecified complication of kidney transplant: Secondary | ICD-10-CM | POA: Diagnosis not present

## 2022-09-04 ENCOUNTER — Telehealth: Payer: Self-pay

## 2022-09-04 NOTE — Patient Outreach (Signed)
  Care Coordination   09/04/2022 Name: Shar Paez MRN: [id number] DOB: [birth year]   Care Coordination Outreach Attempts:  An unsuccessful telephone outreach was attempted today to offer the patient information about available care coordination services as a benefit of their health plan.   Follow Up Plan:  Additional outreach attempts will be made to offer the patient care coordination information and services.   Encounter Outcome:  No Answer    Care Coordination Interventions:  No, not indicated    Tomasa Rand, RN, BSN, [facility] [employer] [phone]

## 2022-09-05 DIAGNOSIS — N2581 Secondary hyperparathyroidism of renal origin: Secondary | ICD-10-CM | POA: Diagnosis not present

## 2022-09-05 DIAGNOSIS — Z23 Encounter for immunization: Secondary | ICD-10-CM | POA: Diagnosis not present

## 2022-09-05 DIAGNOSIS — E876 Hypokalemia: Secondary | ICD-10-CM | POA: Diagnosis not present

## 2022-09-05 DIAGNOSIS — N186 End stage renal disease: Secondary | ICD-10-CM | POA: Diagnosis not present

## 2022-09-05 DIAGNOSIS — D013 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: Secondary | ICD-10-CM

## 2022-09-05 DIAGNOSIS — D631 Anemia in chronic kidney disease: Secondary | ICD-10-CM | POA: Diagnosis not present

## 2022-09-05 DIAGNOSIS — D071 Carcinoma in situ of vulva: Secondary | ICD-10-CM

## 2022-09-05 DIAGNOSIS — Z992 Dependence on renal dialysis: Secondary | ICD-10-CM | POA: Diagnosis not present

## 2022-09-05 HISTORY — DX: 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: D01.3

## 2022-09-05 HISTORY — DX: Carcinoma in situ of vulva: D07.1

## 2022-09-07 ENCOUNTER — Telehealth: Payer: Self-pay

## 2022-09-07 DIAGNOSIS — N2581 Secondary hyperparathyroidism of renal origin: Secondary | ICD-10-CM | POA: Diagnosis not present

## 2022-09-07 DIAGNOSIS — Z992 Dependence on renal dialysis: Secondary | ICD-10-CM | POA: Diagnosis not present

## 2022-09-07 DIAGNOSIS — E876 Hypokalemia: Secondary | ICD-10-CM | POA: Diagnosis not present

## 2022-09-07 DIAGNOSIS — N186 End stage renal disease: Secondary | ICD-10-CM | POA: Diagnosis not present

## 2022-09-07 DIAGNOSIS — D631 Anemia in chronic kidney disease: Secondary | ICD-10-CM | POA: Diagnosis not present

## 2022-09-07 DIAGNOSIS — Z23 Encounter for immunization: Secondary | ICD-10-CM | POA: Diagnosis not present

## 2022-09-07 NOTE — Patient Outreach (Signed)
  Care Coordination   Initial Visit Note   09/07/2022 Name: Ariel [name] MRN: [id number] DOB: July 14, 1976  Ariel Showers Spalla is a 47 y.o. year old female who sees [place], [place], [place] for primary care. I spoke with  Ariel [name] by phone today.  What matters to the patients health and wellness today?  Placed call to patient today to review and offer [facility] care coordination program. Patient reports that she is doing well and denies any needs today.     SDOH assessments and interventions completed:  No     Care Coordination Interventions:  No, not indicated   Follow up plan: No further intervention required.   Encounter Outcome:  Pt. Refused   Tomasa Rand, RN, BSN, CEN [facility] [employer] [phone]

## 2022-09-10 DIAGNOSIS — N186 End stage renal disease: Secondary | ICD-10-CM | POA: Diagnosis not present

## 2022-09-10 DIAGNOSIS — E876 Hypokalemia: Secondary | ICD-10-CM | POA: Diagnosis not present

## 2022-09-10 DIAGNOSIS — Z992 Dependence on renal dialysis: Secondary | ICD-10-CM | POA: Diagnosis not present

## 2022-09-10 DIAGNOSIS — D631 Anemia in chronic kidney disease: Secondary | ICD-10-CM | POA: Diagnosis not present

## 2022-09-10 DIAGNOSIS — Z23 Encounter for immunization: Secondary | ICD-10-CM | POA: Diagnosis not present

## 2022-09-10 DIAGNOSIS — N2581 Secondary hyperparathyroidism of renal origin: Secondary | ICD-10-CM | POA: Diagnosis not present

## 2022-09-12 DIAGNOSIS — E876 Hypokalemia: Secondary | ICD-10-CM | POA: Diagnosis not present

## 2022-09-12 DIAGNOSIS — N2581 Secondary hyperparathyroidism of renal origin: Secondary | ICD-10-CM | POA: Diagnosis not present

## 2022-09-12 DIAGNOSIS — N186 End stage renal disease: Secondary | ICD-10-CM | POA: Diagnosis not present

## 2022-09-12 DIAGNOSIS — Z992 Dependence on renal dialysis: Secondary | ICD-10-CM | POA: Diagnosis not present

## 2022-09-12 DIAGNOSIS — D631 Anemia in chronic kidney disease: Secondary | ICD-10-CM | POA: Diagnosis not present

## 2022-09-12 DIAGNOSIS — Z23 Encounter for immunization: Secondary | ICD-10-CM | POA: Diagnosis not present

## 2022-09-14 DIAGNOSIS — N186 End stage renal disease: Secondary | ICD-10-CM | POA: Diagnosis not present

## 2022-09-14 DIAGNOSIS — N2581 Secondary hyperparathyroidism of renal origin: Secondary | ICD-10-CM | POA: Diagnosis not present

## 2022-09-14 DIAGNOSIS — Z992 Dependence on renal dialysis: Secondary | ICD-10-CM | POA: Diagnosis not present

## 2022-09-14 DIAGNOSIS — D631 Anemia in chronic kidney disease: Secondary | ICD-10-CM | POA: Diagnosis not present

## 2022-09-14 DIAGNOSIS — E876 Hypokalemia: Secondary | ICD-10-CM | POA: Diagnosis not present

## 2022-09-14 DIAGNOSIS — Z23 Encounter for immunization: Secondary | ICD-10-CM | POA: Diagnosis not present

## 2022-09-17 DIAGNOSIS — N186 End stage renal disease: Secondary | ICD-10-CM | POA: Diagnosis not present

## 2022-09-17 DIAGNOSIS — N2581 Secondary hyperparathyroidism of renal origin: Secondary | ICD-10-CM | POA: Diagnosis not present

## 2022-09-17 DIAGNOSIS — Z992 Dependence on renal dialysis: Secondary | ICD-10-CM | POA: Diagnosis not present

## 2022-09-17 DIAGNOSIS — D631 Anemia in chronic kidney disease: Secondary | ICD-10-CM | POA: Diagnosis not present

## 2022-09-17 DIAGNOSIS — Z23 Encounter for immunization: Secondary | ICD-10-CM | POA: Diagnosis not present

## 2022-09-17 DIAGNOSIS — E876 Hypokalemia: Secondary | ICD-10-CM | POA: Diagnosis not present

## 2022-09-19 DIAGNOSIS — Z23 Encounter for immunization: Secondary | ICD-10-CM | POA: Diagnosis not present

## 2022-09-19 DIAGNOSIS — N186 End stage renal disease: Secondary | ICD-10-CM | POA: Diagnosis not present

## 2022-09-19 DIAGNOSIS — N2581 Secondary hyperparathyroidism of renal origin: Secondary | ICD-10-CM | POA: Diagnosis not present

## 2022-09-19 DIAGNOSIS — D631 Anemia in chronic kidney disease: Secondary | ICD-10-CM | POA: Diagnosis not present

## 2022-09-19 DIAGNOSIS — Z992 Dependence on renal dialysis: Secondary | ICD-10-CM | POA: Diagnosis not present

## 2022-09-19 DIAGNOSIS — E876 Hypokalemia: Secondary | ICD-10-CM | POA: Diagnosis not present

## 2022-09-21 DIAGNOSIS — N186 End stage renal disease: Secondary | ICD-10-CM | POA: Diagnosis not present

## 2022-09-21 DIAGNOSIS — E876 Hypokalemia: Secondary | ICD-10-CM | POA: Diagnosis not present

## 2022-09-21 DIAGNOSIS — D631 Anemia in chronic kidney disease: Secondary | ICD-10-CM | POA: Diagnosis not present

## 2022-09-21 DIAGNOSIS — Z23 Encounter for immunization: Secondary | ICD-10-CM | POA: Diagnosis not present

## 2022-09-21 DIAGNOSIS — Z992 Dependence on renal dialysis: Secondary | ICD-10-CM | POA: Diagnosis not present

## 2022-09-21 DIAGNOSIS — N2581 Secondary hyperparathyroidism of renal origin: Secondary | ICD-10-CM | POA: Diagnosis not present

## 2022-09-24 DIAGNOSIS — D631 Anemia in chronic kidney disease: Secondary | ICD-10-CM | POA: Diagnosis not present

## 2022-09-24 DIAGNOSIS — Z23 Encounter for immunization: Secondary | ICD-10-CM | POA: Diagnosis not present

## 2022-09-24 DIAGNOSIS — N186 End stage renal disease: Secondary | ICD-10-CM | POA: Diagnosis not present

## 2022-09-24 DIAGNOSIS — N2581 Secondary hyperparathyroidism of renal origin: Secondary | ICD-10-CM | POA: Diagnosis not present

## 2022-09-24 DIAGNOSIS — Z992 Dependence on renal dialysis: Secondary | ICD-10-CM | POA: Diagnosis not present

## 2022-09-24 DIAGNOSIS — E876 Hypokalemia: Secondary | ICD-10-CM | POA: Diagnosis not present

## 2022-09-25 DIAGNOSIS — Z9889 Other specified postprocedural states: Secondary | ICD-10-CM | POA: Diagnosis not present

## 2022-09-25 DIAGNOSIS — D013 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: Secondary | ICD-10-CM | POA: Diagnosis not present

## 2022-09-26 DIAGNOSIS — Z992 Dependence on renal dialysis: Secondary | ICD-10-CM | POA: Diagnosis not present

## 2022-09-26 DIAGNOSIS — N2581 Secondary hyperparathyroidism of renal origin: Secondary | ICD-10-CM | POA: Diagnosis not present

## 2022-09-26 DIAGNOSIS — E876 Hypokalemia: Secondary | ICD-10-CM | POA: Diagnosis not present

## 2022-09-26 DIAGNOSIS — N186 End stage renal disease: Secondary | ICD-10-CM | POA: Diagnosis not present

## 2022-09-26 DIAGNOSIS — D631 Anemia in chronic kidney disease: Secondary | ICD-10-CM | POA: Diagnosis not present

## 2022-09-26 DIAGNOSIS — Z23 Encounter for immunization: Secondary | ICD-10-CM | POA: Diagnosis not present

## 2022-09-28 DIAGNOSIS — D631 Anemia in chronic kidney disease: Secondary | ICD-10-CM | POA: Diagnosis not present

## 2022-09-28 DIAGNOSIS — E876 Hypokalemia: Secondary | ICD-10-CM | POA: Diagnosis not present

## 2022-09-28 DIAGNOSIS — Z23 Encounter for immunization: Secondary | ICD-10-CM | POA: Diagnosis not present

## 2022-09-28 DIAGNOSIS — Z992 Dependence on renal dialysis: Secondary | ICD-10-CM | POA: Diagnosis not present

## 2022-09-28 DIAGNOSIS — N186 End stage renal disease: Secondary | ICD-10-CM | POA: Diagnosis not present

## 2022-09-28 DIAGNOSIS — N2581 Secondary hyperparathyroidism of renal origin: Secondary | ICD-10-CM | POA: Diagnosis not present

## 2022-10-01 DIAGNOSIS — N2581 Secondary hyperparathyroidism of renal origin: Secondary | ICD-10-CM | POA: Diagnosis not present

## 2022-10-01 DIAGNOSIS — Z23 Encounter for immunization: Secondary | ICD-10-CM | POA: Diagnosis not present

## 2022-10-01 DIAGNOSIS — D631 Anemia in chronic kidney disease: Secondary | ICD-10-CM | POA: Diagnosis not present

## 2022-10-01 DIAGNOSIS — N186 End stage renal disease: Secondary | ICD-10-CM | POA: Diagnosis not present

## 2022-10-01 DIAGNOSIS — E876 Hypokalemia: Secondary | ICD-10-CM | POA: Diagnosis not present

## 2022-10-01 DIAGNOSIS — Z992 Dependence on renal dialysis: Secondary | ICD-10-CM | POA: Diagnosis not present

## 2022-10-02 DIAGNOSIS — G4701 Insomnia due to medical condition: Secondary | ICD-10-CM | POA: Diagnosis not present

## 2022-10-02 DIAGNOSIS — G2581 Restless legs syndrome: Secondary | ICD-10-CM | POA: Diagnosis not present

## 2022-10-02 DIAGNOSIS — R5383 Other fatigue: Secondary | ICD-10-CM | POA: Diagnosis not present

## 2022-10-02 DIAGNOSIS — G4733 Obstructive sleep apnea (adult) (pediatric): Secondary | ICD-10-CM | POA: Diagnosis not present

## 2022-10-02 DIAGNOSIS — R053 Chronic cough: Secondary | ICD-10-CM | POA: Diagnosis not present

## 2022-10-03 DIAGNOSIS — N2581 Secondary hyperparathyroidism of renal origin: Secondary | ICD-10-CM | POA: Diagnosis not present

## 2022-10-03 DIAGNOSIS — N186 End stage renal disease: Secondary | ICD-10-CM | POA: Diagnosis not present

## 2022-10-03 DIAGNOSIS — Z23 Encounter for immunization: Secondary | ICD-10-CM | POA: Diagnosis not present

## 2022-10-03 DIAGNOSIS — E876 Hypokalemia: Secondary | ICD-10-CM | POA: Diagnosis not present

## 2022-10-03 DIAGNOSIS — D631 Anemia in chronic kidney disease: Secondary | ICD-10-CM | POA: Diagnosis not present

## 2022-10-03 DIAGNOSIS — Z992 Dependence on renal dialysis: Secondary | ICD-10-CM | POA: Diagnosis not present

## 2022-10-04 DIAGNOSIS — Z992 Dependence on renal dialysis: Secondary | ICD-10-CM | POA: Diagnosis not present

## 2022-10-04 DIAGNOSIS — N186 End stage renal disease: Secondary | ICD-10-CM | POA: Diagnosis not present

## 2022-10-04 DIAGNOSIS — T8610 Unspecified complication of kidney transplant: Secondary | ICD-10-CM | POA: Diagnosis not present

## 2022-10-10 DIAGNOSIS — I700 Atherosclerosis of aorta: Secondary | ICD-10-CM | POA: Diagnosis not present

## 2022-10-10 DIAGNOSIS — Z6834 Body mass index (BMI) 34.0-34.9, adult: Secondary | ICD-10-CM | POA: Diagnosis not present

## 2022-10-10 DIAGNOSIS — R350 Frequency of micturition: Secondary | ICD-10-CM | POA: Diagnosis not present

## 2022-10-10 DIAGNOSIS — N390 Urinary tract infection, site not specified: Secondary | ICD-10-CM | POA: Diagnosis not present

## 2022-10-11 ENCOUNTER — Telehealth: Payer: Self-pay

## 2022-10-11 ENCOUNTER — Ambulatory Visit: Payer: Medicare Other | Attending: Cardiology

## 2022-10-11 DIAGNOSIS — R000 Tachycardia, unspecified: Secondary | ICD-10-CM | POA: Diagnosis not present

## 2022-10-11 DIAGNOSIS — R002 Palpitations: Secondary | ICD-10-CM

## 2022-10-11 DIAGNOSIS — N186 End stage renal disease: Secondary | ICD-10-CM | POA: Diagnosis not present

## 2022-10-11 DIAGNOSIS — Z79899 Other long term (current) drug therapy: Secondary | ICD-10-CM | POA: Diagnosis not present

## 2022-10-11 DIAGNOSIS — N281 Cyst of kidney, acquired: Secondary | ICD-10-CM | POA: Diagnosis not present

## 2022-10-11 DIAGNOSIS — K5730 Diverticulosis of large intestine without perforation or abscess without bleeding: Secondary | ICD-10-CM | POA: Diagnosis not present

## 2022-10-11 DIAGNOSIS — R531 Weakness: Secondary | ICD-10-CM | POA: Diagnosis not present

## 2022-10-11 DIAGNOSIS — R9431 Abnormal electrocardiogram [ECG] [EKG]: Secondary | ICD-10-CM | POA: Diagnosis not present

## 2022-10-11 DIAGNOSIS — Z940 Kidney transplant status: Secondary | ICD-10-CM | POA: Diagnosis not present

## 2022-10-11 DIAGNOSIS — I10 Essential (primary) hypertension: Secondary | ICD-10-CM | POA: Diagnosis not present

## 2022-10-11 NOTE — Telephone Encounter (Signed)
Ariel [name], Ariel [name] with [facility] called and states pt has been having bouts of tachycardia. Requested Zio and FU with Dr. Geraldo Pitter. Zio ordered.

## 2022-10-12 DIAGNOSIS — E8779 Other fluid overload: Secondary | ICD-10-CM

## 2022-10-12 DIAGNOSIS — N186 End stage renal disease: Secondary | ICD-10-CM | POA: Diagnosis not present

## 2022-10-12 DIAGNOSIS — T82519D Breakdown (mechanical) of unspecified cardiac and vascular devices and implants, subsequent encounter: Secondary | ICD-10-CM | POA: Insufficient documentation

## 2022-10-12 HISTORY — DX: Other fluid overload: E87.79

## 2022-10-12 HISTORY — DX: Breakdown (mechanical) of unspecified cardiac and vascular devices and implants, subsequent encounter: T82.519D

## 2022-10-23 DIAGNOSIS — N9089 Other specified noninflammatory disorders of vulva and perineum: Secondary | ICD-10-CM | POA: Diagnosis not present

## 2022-10-23 DIAGNOSIS — D013 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: Secondary | ICD-10-CM | POA: Diagnosis not present

## 2022-11-01 ENCOUNTER — Other Ambulatory Visit: Payer: Self-pay

## 2022-11-02 DIAGNOSIS — T8610 Unspecified complication of kidney transplant: Secondary | ICD-10-CM | POA: Diagnosis not present

## 2022-11-02 DIAGNOSIS — N186 End stage renal disease: Secondary | ICD-10-CM | POA: Diagnosis not present

## 2022-11-02 DIAGNOSIS — Z992 Dependence on renal dialysis: Secondary | ICD-10-CM | POA: Diagnosis not present

## 2022-11-05 ENCOUNTER — Ambulatory Visit: Payer: Medicare Other | Attending: Cardiology | Admitting: Cardiology

## 2022-11-05 ENCOUNTER — Encounter: Payer: Self-pay | Admitting: Cardiology

## 2022-11-05 VITALS — BP 110/70 | HR 102 | Ht 59.0 in | Wt 173.0 lb

## 2022-11-05 DIAGNOSIS — N184 Chronic kidney disease, stage 4 (severe): Secondary | ICD-10-CM | POA: Diagnosis not present

## 2022-11-05 DIAGNOSIS — N186 End stage renal disease: Secondary | ICD-10-CM | POA: Insufficient documentation

## 2022-11-05 DIAGNOSIS — R002 Palpitations: Secondary | ICD-10-CM | POA: Diagnosis not present

## 2022-11-05 DIAGNOSIS — Z992 Dependence on renal dialysis: Secondary | ICD-10-CM | POA: Diagnosis not present

## 2022-11-05 DIAGNOSIS — E669 Obesity, unspecified: Secondary | ICD-10-CM | POA: Diagnosis not present

## 2022-11-05 DIAGNOSIS — G4700 Insomnia, unspecified: Secondary | ICD-10-CM | POA: Diagnosis not present

## 2022-11-05 DIAGNOSIS — T8612 Kidney transplant failure: Secondary | ICD-10-CM | POA: Diagnosis not present

## 2022-11-05 DIAGNOSIS — F419 Anxiety disorder, unspecified: Secondary | ICD-10-CM | POA: Diagnosis not present

## 2022-11-05 DIAGNOSIS — Z6834 Body mass index (BMI) 34.0-34.9, adult: Secondary | ICD-10-CM | POA: Diagnosis not present

## 2022-11-05 DIAGNOSIS — I10 Essential (primary) hypertension: Secondary | ICD-10-CM | POA: Diagnosis not present

## 2022-11-05 HISTORY — DX: End stage renal disease: N18.6

## 2022-11-05 NOTE — Patient Instructions (Signed)

## 2022-11-05 NOTE — Progress Notes (Signed)
Cardiology Office Note:    Date:  11/05/2022   ID:  [place], DOB [id number], MRN [id number]  PCP:  [name], PA-C  Cardiologist:  Jenean Lindau, MD   Referring MD: [name], PA-C    ASSESSMENT:    1. Failed kidney transplant   2. End stage renal disease on dialysis (HCC)   3. Palpitations   4. Obesity, Class I, BMI 30-34.9    PLAN:    In order of problems listed above:  Primary prevention stressed with the patient.  Importance of compliance with diet medication stressed and she vocalized understanding.  She was advised to ambulate to the best of her ability. Palpitations: She is undergoing monitoring and return to her monitor.  We will review this once it is available. Stress test done in the past was unremarkable and I discussed this with her. End-stage renal disease on dialysis: She is very meticulous about following instructions by nephrology. Obesity: Weight reduction stressed diet emphasized.  She promises to do better. Patient will be seen in follow-up appointment in 6 months or earlier if the patient has any concerns    Medication Adjustments/Labs and Tests Ordered: Current medicines are reviewed at length with the patient today.  Concerns regarding medicines are outlined above.  No orders of the defined types were placed in this encounter.  No orders of the defined types were placed in this encounter.    No chief complaint on file.    History of Present Illness:    Ariel [name] is a 47 y.o. female.  Patient has history of palpitations, end-stage renal disease on dialysis.  She is renal transplant is twice and this has failed.  She denies any problems from a cardiovascular standpoint.  No chest pain orthopnea or PND.  At the time of my evaluation, the patient is alert awake oriented and in no distress.  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   AKI (acute kidney injury) ([place]) 08/18/2021   Allergy, unspecified, initial encounter 08/28/2022   Anaphylactic shock, unspecified, initial encounter 08/28/2022   Anemia    Anemia associated with chronic renal failure    Anemia in chronic kidney disease 08/21/2022   Anorectal disorder 06/08/2020   Anorectal pain 06/08/2020   Anxiety disorder 10/29/2018   Back pain    Bloating 04/16/2018   Cancer ([place])    pre cervical   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    of the [place]   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 09/05/2022   Carcinoma in situ of vulva 09/05/2022   Chest pain of uncertain etiology [id number]   Chronic kidney disease, stage V ([place]) 123456   Complications due to cardiac device, implant, and graft 09/02/2012   Condyloma of female genitalia    Dehydration 01/19/2019   Dependence on renal dialysis ([place]) 08/21/2022   Depression    Dialysis patient ([place])    not currently as of 07/02/22   Drug-induced bleeding disorder ([place]) 01/19/2019   DVT of axillary vein, acute left ([place]) 08/02/2012   Dysmenorrhea 10/19/2015   Dyspnea on exertion 01/19/2019   Dysuria    End stage renal disease ([place]) 09/02/2012   ESRD (end stage renal disease) ([place])    sp transplant x 2, did mix of HD and PD  from 2001- 2008   Essential hypertension 11/03/2021   Essential hypertension with goal blood pressure less than 130/80    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   Fatigue    Gastritis, acute    GERD without esophagitis 12/20/2021   Gout    Gout due to renal impairment, unspecified site 08/21/2022   History of parathyroidectomy ([place]) 10/29/2018   History of renal transplant    HTN (hypertension) 01/19/2019   Hypercholesterolemia    Hyperlipidemia    Hyperphosphatemia 12/20/2021   Hypertensive chronic kidney disease with stage 1 through stage 4 chronic kidney disease, or unspecified chronic  kidney disease 08/21/2022   Hypokalemia    Hypomagnesemia    Hypothyroid 01/19/2019   IBS (irritable bowel syndrome)    Immunosuppressive management encounter following kidney transplant 10/29/2018   Internal hemorrhoid 04/22/2018   Iron deficiency anemia 10/19/2015   Irregular uterine bleeding 02/13/2016   Kidney disorder 06/08/2020   Kidney transplant recipient 10/29/2018   1st transplant [facility]) was 1995- 2001.  Did HD/ PD mix from 2001- 2008. 2nd transplant ([facility]/ [facility]) was in 2008 and is still working as of 12/2021. F/b Dr Hollie Salk ([place]) and [facility].   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   Obesity, Class I, BMI 30-34.9 12/21/2021   Other chronic sinusitis 10/29/2018   Pain, unspecified 08/28/2022   Palpitations    Pancreatic duct dilated 02/15/2022   Pancreatitis    Perianal lesion 04/22/2018   Phlebitis and thrombophlebitis of other sites 08/02/2012   Plantar fasciitis 07/03/2018   Pre-transplant evaluation for kidney transplant 01/09/2022   Preop cardiovascular exam 05/09/2022   Pruritus ani 06/26/2018   Renal failure, chronic 11/03/2021   Dialysis in the past   Right lower quadrant abdominal pain 10/29/2018   [facility] spotted fever    S/p cadaver renal transplant 08/02/2012   Secondary hyperparathyroidism of renal origin [facility])    Secondary hypertension due to renal disease 10/29/2018    Sesamoiditis 07/03/2018   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    Transplanted organ removal status 10/29/2018   Unstable angina ([place]) 01/19/2019   Urinary tract infection    Vision loss 06/15/2022   Vulvar dystrophy    Yeast dermatitis 08/14/2018    Past Surgical History:  Procedure Laterality Date   AV FISTULA PLACEMENT     AV FISTULA PLACEMENT Right 05/17/2022   Procedure: RIGHT ARM BRACHIOCEPHALIC ARTERIOVENOUS (AV) FISTULA CREATION;  Surgeon: [name], MD;  Location: [facility] OR;  Service: Vascular;  Laterality: Right;   COLONOSCOPY  11/28/2005   [facility]   COLONOSCOPY  04/13/2016   Dr Orlena Sheldon   ESOPHAGOGASTRODUODENOSCOPY  07/31/2017   Distal esophageal ulcers (likely due to gastroeesphageal reflix-biopsied). Small hiatal hernia. Erosive gastritis.   FISTULA SUPERFICIALIZATION Right 07/17/2022   Procedure: SUPERFICIALIZATION AND REVISION RIGHT ARM FISTULA;  Surgeon: [name], MD;  Location: [facility];  Service: Vascular;  Laterality: Right;  HEMORRHOID SURGERY     2019, and 04/29/2020   INSERTION OF DIALYSIS CATHETER     KIDNEY TRANSPLANT  1995, 2008   KNEE SURGERY Left 2020   PARATHYROIDECTOMY  2004   Portion on the parathyroid gland transplanted in R forearm   REMOVAL OF A DIALYSIS CATHETER     SIMPLE VULVECTOMY  06/19/2011   Partial simple left   SKIN CANCER EXCISION  2016   Per pt, area removed on scalp showed squamous cell carcinoma   TUBAL LIGATION     UPPER GASTROINTESTINAL ENDOSCOPY     WISDOM TOOTH EXTRACTION      Current Medications: Current Meds  Medication Sig   acetaminophen (TYLENOL) 500 MG tablet Take 500 mg by mouth every 6 (six) hours as needed for headache (pain).   allopurinol (ZYLOPRIM) 100 MG tablet Take 100 mg by mouth daily.   ALPRAZolam (XANAX) 0.5 MG tablet Take 0.5 mg by mouth at bedtime.    aspirin-acetaminophen-caffeine (EXCEDRIN MIGRAINE) 250-250-65 MG tablet Take 1 tablet by mouth every 6 (six) hours as needed for  headache.   B Complex-C-Folic Acid (RENA-VITE RX) 1 MG TABS Take 1 tablet by mouth daily.   famotidine (PEPCID) 20 MG tablet Take 1 tablet (20 mg total) by mouth daily.   fluticasone (FLONASE) 50 MCG/ACT nasal spray Place 2 sprays into both nostrils daily as needed for allergies.   furosemide (LASIX) 40 MG tablet Take 40 mg by mouth daily as needed for fluid or edema.    gabapentin (NEURONTIN) 300 MG capsule Take 300 mg by mouth at bedtime.   hydrOXYzine (ATARAX) 25 MG tablet Take 25-50 mg by mouth 2 (two) times daily as needed for dizziness or anxiety.   magnesium oxide (MAG-OX) 400 (240 Mg) MG tablet Take 400 mg by mouth daily.   medroxyPROGESTERone (PROVERA) 10 MG tablet Take 10 mg by mouth daily.   mycophenolate (MYFORTIC) 180 MG EC tablet Take 180 mg by mouth 2 (two) times daily.   nitroGLYCERIN (NITROSTAT) 0.4 MG SL tablet Place 0.4 mg under the tongue every 5 (five) minutes as needed for chest pain.   ondansetron (ZOFRAN-ODT) 4 MG disintegrating tablet Take 4 mg by mouth as needed for nausea/vomiting.   pantoprazole (PROTONIX) 40 MG tablet Take 1 tablet (40 mg total) by mouth daily.   Phenylephrine-Acetaminophen (TYLENOL SINUS+HEADACHE) 5-325 MG TABS Take 2 tablets by mouth daily as needed (sinus headaches).   predniSONE (DELTASONE) 1 MG tablet Take 4 mg by mouth daily.   sertraline (ZOLOFT) 50 MG tablet Take 50 mg by mouth daily.   sevelamer carbonate (RENVELA) 800 MG tablet Take 800 mg by mouth 3 (three) times daily with meals.   simvastatin (ZOCOR) 5 MG tablet Take 5 mg by mouth at bedtime.   sodium bicarbonate 650 MG tablet Take 1,300 mg by mouth 2 (two) times daily.   tacrolimus (PROGRAF) 0.5 MG capsule Take 0.5 mg by mouth 2 (two) times daily. Take along with 1 mg capsule=1.5 mg BID   traZODone (DESYREL) 100 MG tablet Take 100 mg by mouth at bedtime.     Allergies:   Bactrim [sulfamethoxazole-trimethoprim], Sulfamethoxazole, Ambien [zolpidem], Hydrogen peroxide, Labetalol, Wound  dressing adhesive, Amlodipine, Hyoscyamine, Keflex [cephalexin], Orphenadrine, Adhesive [tape], Imuran [azathioprine sodium], Neosporin [bacitracin-polymyxin b], Tramadol, and Warfarin sodium   Social History   Socioeconomic History   Marital status: Divorced    Spouse name: Not on file   Number of children: Not on file   Years of education: Not on file  Highest education level: Not on file  Occupational History   Not on file  Tobacco Use   Smoking status: Never    Passive exposure: Never   Smokeless tobacco: Never  Vaping Use   Vaping Use: Never used  Substance and Sexual Activity   Alcohol use: No   Drug use: No   Sexual activity: Not on file  Other Topics Concern   Not on file  Social History Narrative   Not on file   Social Determinants of Health   Financial Resource Strain: Not on file  Food Insecurity: Not on file  Transportation Needs: Not on file  Physical Activity: Not on file  Stress: Not on file  Social Connections: Not on file     Family History: The patient's family history includes Colon cancer in her maternal grandmother; Diabetes in her father; Hyperlipidemia in her mother; Hypertension in her mother; Rectal cancer in her maternal grandmother. There is no history of Esophageal cancer or Stomach cancer.  ROS:   Please see the history of present illness.    All other systems reviewed and are negative.  EKGs/Labs/Other Studies Reviewed:    The following studies were reviewed today: I discussed my findings with the patient at length.   Recent Labs: 02/17/2022: ALT 9; Magnesium 1.9; Platelets 196 07/17/2022: BUN 41; Creatinine, Ser 3.70; Hemoglobin 8.8; Potassium 4.6; Sodium 140  Recent Lipid Panel No results found for: "CHOL", "TRIG", "HDL", "CHOLHDL", "VLDL", "LDLCALC", "LDLDIRECT"  Physical Exam:    VS:  BP 110/70   Pulse (!) 102   Ht '4\' 11"'$  (1.499 m)   Wt 173 lb (78.5 kg)   LMP 09/13/2013   SpO2 95%   BMI 34.94 kg/m     Wt Readings  from Last 3 Encounters:  11/05/22 173 lb (78.5 kg)  08/10/22 185 lb 8 oz (84.1 kg)  07/17/22 175 lb (79.4 kg)     GEN: Patient is in no acute distress HEENT: Normal NECK: No JVD; No carotid bruits LYMPHATICS: No lymphadenopathy CARDIAC: Hear sounds regular, 2/6 systolic murmur at the apex. RESPIRATORY:  Clear to auscultation without rales, wheezing or rhonchi  ABDOMEN: Soft, non-tender, non-distended MUSCULOSKELETAL:  No edema; No deformity  SKIN: Warm and dry NEUROLOGIC:  Alert and oriented x 3 PSYCHIATRIC:  Normal affect   Signed, Jenean Lindau, MD  11/05/2022 4:07 PM    [facility]

## 2022-11-06 DIAGNOSIS — R002 Palpitations: Secondary | ICD-10-CM | POA: Diagnosis not present

## 2022-11-08 ENCOUNTER — Telehealth: Payer: Self-pay

## 2022-11-08 MED ORDER — METOPROLOL SUCCINATE ER 25 MG PO TB24: 12.5000 mg | ORAL_TABLET | Freq: Every day | ORAL | 3 refills | Status: DC

## 2022-11-08 NOTE — Telephone Encounter (Signed)
-----   Message from Jenean Lindau, MD sent at 11/08/2022 10:00 AM EST ----- Metoprolol 12.5 by mouth every morning.  Succinate. ----- Message ----- From: Truddie Hidden, RN Sent: 11/08/2022   9:24 AM EST To: Jenean Lindau, MD  She would like medication to help. Pt is on dialysis. ----- Message ----- From: Jenean Lindau, MD Sent: 11/07/2022   1:51 PM EST To: Truddie Hidden, RN  If patient has bothersome palpitations we can give her medicines for this otherwise overall is unremarkable with brief atrial runs.  Copy primary care Jenean Lindau, MD 11/07/2022 1:51 PM

## 2022-11-08 NOTE — Telephone Encounter (Signed)
Results reviewed with pt as per Dr. Revankar's note.  Pt verbalized understanding and had no additional questions.   

## 2022-11-16 DIAGNOSIS — Z6834 Body mass index (BMI) 34.0-34.9, adult: Secondary | ICD-10-CM | POA: Diagnosis not present

## 2022-11-16 DIAGNOSIS — G5603 Carpal tunnel syndrome, bilateral upper limbs: Secondary | ICD-10-CM | POA: Diagnosis not present

## 2022-11-17 DIAGNOSIS — Z990 Dependence on aspirator: Secondary | ICD-10-CM | POA: Diagnosis not present

## 2022-11-17 DIAGNOSIS — R000 Tachycardia, unspecified: Secondary | ICD-10-CM | POA: Diagnosis not present

## 2022-11-17 DIAGNOSIS — E232 Diabetes insipidus: Secondary | ICD-10-CM | POA: Diagnosis not present

## 2022-11-17 DIAGNOSIS — Z79899 Other long term (current) drug therapy: Secondary | ICD-10-CM | POA: Diagnosis not present

## 2022-11-17 DIAGNOSIS — R002 Palpitations: Secondary | ICD-10-CM | POA: Diagnosis not present

## 2022-11-17 DIAGNOSIS — R202 Paresthesia of skin: Secondary | ICD-10-CM | POA: Diagnosis not present

## 2022-11-17 DIAGNOSIS — Z992 Dependence on renal dialysis: Secondary | ICD-10-CM | POA: Diagnosis not present

## 2022-11-17 DIAGNOSIS — R0789 Other chest pain: Secondary | ICD-10-CM | POA: Diagnosis not present

## 2022-11-17 DIAGNOSIS — R079 Chest pain, unspecified: Secondary | ICD-10-CM | POA: Diagnosis not present

## 2022-11-17 DIAGNOSIS — N189 Chronic kidney disease, unspecified: Secondary | ICD-10-CM | POA: Diagnosis not present

## 2022-11-17 DIAGNOSIS — Z940 Kidney transplant status: Secondary | ICD-10-CM | POA: Diagnosis not present

## 2022-11-17 DIAGNOSIS — I129 Hypertensive chronic kidney disease with stage 1 through stage 4 chronic kidney disease, or unspecified chronic kidney disease: Secondary | ICD-10-CM | POA: Diagnosis not present

## 2022-11-19 ENCOUNTER — Encounter (HOSPITAL_COMMUNITY): Payer: Self-pay

## 2022-11-19 ENCOUNTER — Other Ambulatory Visit: Payer: Self-pay

## 2022-11-19 ENCOUNTER — Emergency Department (HOSPITAL_COMMUNITY)
Admission: EM | Admit: 2022-11-19 | Discharge: 2022-11-19 | Disposition: A | Discharge status: Home / Self Care | Payer: Medicare Other | Attending: Emergency Medicine | Admitting: Emergency Medicine

## 2022-11-19 ENCOUNTER — Emergency Department (HOSPITAL_COMMUNITY): Payer: Medicare Other

## 2022-11-19 DIAGNOSIS — R52 Pain, unspecified: Secondary | ICD-10-CM

## 2022-11-19 DIAGNOSIS — Z79899 Other long term (current) drug therapy: Secondary | ICD-10-CM | POA: Diagnosis not present

## 2022-11-19 DIAGNOSIS — R0789 Other chest pain: Secondary | ICD-10-CM | POA: Diagnosis not present

## 2022-11-19 DIAGNOSIS — I120 Hypertensive chronic kidney disease with stage 5 chronic kidney disease or end stage renal disease: Secondary | ICD-10-CM | POA: Diagnosis not present

## 2022-11-19 DIAGNOSIS — M542 Cervicalgia: Secondary | ICD-10-CM | POA: Diagnosis not present

## 2022-11-19 DIAGNOSIS — Z992 Dependence on renal dialysis: Secondary | ICD-10-CM | POA: Diagnosis not present

## 2022-11-19 DIAGNOSIS — R110 Nausea: Secondary | ICD-10-CM | POA: Insufficient documentation

## 2022-11-19 DIAGNOSIS — M545 Low back pain, unspecified: Secondary | ICD-10-CM | POA: Insufficient documentation

## 2022-11-19 DIAGNOSIS — R002 Palpitations: Secondary | ICD-10-CM

## 2022-11-19 DIAGNOSIS — R079 Chest pain, unspecified: Secondary | ICD-10-CM | POA: Diagnosis not present

## 2022-11-19 DIAGNOSIS — R000 Tachycardia, unspecified: Secondary | ICD-10-CM | POA: Insufficient documentation

## 2022-11-19 DIAGNOSIS — N186 End stage renal disease: Secondary | ICD-10-CM | POA: Diagnosis not present

## 2022-11-19 LAB — URINALYSIS, ROUTINE W REFLEX MICROSCOPIC
Bilirubin Urine: NEGATIVE
Glucose, UA: NEGATIVE mg/dL
Ketones, ur: NEGATIVE mg/dL
Nitrite: NEGATIVE
Protein, ur: 100 mg/dL — AB
Specific Gravity, Urine: 1.01 (ref 1.005–1.030)
pH: 8 (ref 5.0–8.0)

## 2022-11-19 LAB — COMPREHENSIVE METABOLIC PANEL
ALT: 16 U/L (ref 0–44)
AST: 25 U/L (ref 15–41)
Albumin: 3.1 g/dL — ABNORMAL LOW (ref 3.5–5.0)
Alkaline Phosphatase: 72 U/L (ref 38–126)
Anion gap: 12 (ref 5–15)
BUN: 28 mg/dL — ABNORMAL HIGH (ref 6–20)
CO2: 19 mmol/L — ABNORMAL LOW (ref 22–32)
Calcium: 9 mg/dL (ref 8.9–10.3)
Chloride: 104 mmol/L (ref 98–111)
Creatinine, Ser: 7.34 mg/dL — ABNORMAL HIGH (ref 0.44–1.00)
GFR, Estimated: 6 mL/min — ABNORMAL LOW (ref >60)
Glucose, Bld: 85 mg/dL (ref 70–99)
Potassium: 4.1 mmol/L (ref 3.5–5.1)
Sodium: 135 mmol/L (ref 135–145)
Total Bilirubin: 0.5 mg/dL (ref 0.3–1.2)
Total Protein: 6.2 g/dL — ABNORMAL LOW (ref 6.5–8.1)

## 2022-11-19 LAB — CBC
HCT: 28.5 % — ABNORMAL LOW (ref 36.0–46.0)
Hemoglobin: 8.9 g/dL — ABNORMAL LOW (ref 12.0–15.0)
MCH: 31.3 pg (ref 26.0–34.0)
MCHC: 31.2 g/dL (ref 30.0–36.0)
MCV: 100.4 fL — ABNORMAL HIGH (ref 80.0–100.0)
Platelets: 199 10*3/uL (ref 150–400)
RBC: 2.84 MIL/uL — ABNORMAL LOW (ref 3.87–5.11)
RDW: 15.9 % — ABNORMAL HIGH (ref 11.5–15.5)
WBC: 5.5 10*3/uL (ref 4.0–10.5)
nRBC: 0 % (ref 0.0–0.2)

## 2022-11-19 LAB — LIPASE, BLOOD: Lipase: 41 U/L (ref 11–51)

## 2022-11-19 LAB — TROPONIN I (HIGH SENSITIVITY): Troponin I (High Sensitivity): 12 ng/L (ref <18)

## 2022-11-19 MED ORDER — OXYCODONE-ACETAMINOPHEN 5-325 MG PO TABS
2.0000 | ORAL_TABLET | Freq: Once | ORAL | Status: AC
  Administered 2022-11-19: 2 via ORAL
  Filled 2022-11-19: qty 2

## 2022-11-19 MED ORDER — DIPHENHYDRAMINE HCL 25 MG PO CAPS
25.0000 mg | ORAL_CAPSULE | Freq: Once | ORAL | Status: AC
  Administered 2022-11-19: 25 mg via ORAL
  Filled 2022-11-19: qty 1

## 2022-11-19 NOTE — Discharge Instructions (Addendum)
Thank you for letting us take care of you today.  Overall, your workup was reassuring and we do not see signs of damage to your heart or other emergent conditions requiring admission to the hospital.  Your urine does not appear acutely infected.  It does appear contaminated by skin cells that likely got into the sample as she were providing it.  However, I am sending it to be cultured to rule out any potential infection.  If this culture comes back positive, you will be contacted to start appropriate antibiotics to treat any potential infection.  Please contact your dialysis clinic to rearrange dialysis session that was scheduled for this morning.  For any new or worsening symptoms such as severe chest pain, shortness of breath, fever, dizziness, loss of consciousness, weakness on one side of your body or another, or other concerning symptoms, please return to the nearest emergency department for reevaluation.

## 2022-11-19 NOTE — ED Triage Notes (Signed)
Patient here for evaluation of chest pain and palpitations that started several days ago. Patient also complains of exertional shortness of breath and states the pain and palpitations are exacerbated by minimal exertion. Scheduled for dialysis this morning but came for evaluation instead, last full treatment on Friday, current access in right arm.

## 2022-11-19 NOTE — ED Provider Notes (Signed)
[facility] Provider Note   CSN: [id number] Arrival date & time: 11/19/22  [id number]     History  Chief Complaint  Patient presents with   Palpitations    Ariel [name] is a 47 y.o. female with extensive past medical history including end-stage renal disease status post 2 previous renal transplants currently on hemodialysis Monday Wednesday Friday who presents to the ED complaining of somewhat generalized pain for the last several days.  She localizes some pain to her chest which she describes as tightness worse with palpation of the chest.  She has other pain from her posterior neck muscles down to her lower back.  She denies a history of chronic neck or back pain.  She denies any fall or injury.  She states she has been feeling like her heart is racing for the last few days, generally weak, and is concerned she has a urinary tract infection as when she was younger she had these frequently and had similar lower back pain.  She reports associated nausea but to me denies focal weakness, vomiting, diarrhea, fever, chills, cough, congestion, shortness of breath, or abdominal pain.  She did report to nursing staff that she is having exertional shortness of breath as well as worsening chest tightness and palpitations with exertion. She has not missed any dialysis sessions recently that was scheduled for a session this morning but came to the ED for evaluation of her other symptoms instead.    Home Medications Prior to Admission medications   Medication Sig Start Date End Date Taking? Authorizing Provider  acetaminophen (TYLENOL) 500 MG tablet Take 500 mg by mouth every 6 (six) hours as needed for headache (pain).    [provider]  allopurinol (ZYLOPRIM) 100 MG tablet Take 100 mg by mouth daily. 10/13/20   [provider]  ALPRAZolam Duanne Moron) 0.5 MG tablet Take 0.5 mg by mouth at bedtime.  05/31/15   [provider]   aspirin-acetaminophen-caffeine (EXCEDRIN MIGRAINE) [phone] MG tablet Take 1 tablet by mouth every 6 (six) hours as needed for headache. 12/22/21   Antonieta Pert, MD  B Complex-C-Folic Acid (RENA-VITE RX) 1 MG TABS Take 1 tablet by mouth daily. 09/26/22   [provider]  famotidine (PEPCID) 20 MG tablet Take 1 tablet (20 mg total) by mouth daily. 07/19/21   Jackquline Denmark, MD  fluticasone [facility]) 50 MCG/ACT nasal spray Place 2 sprays into both nostrils daily as needed for allergies. 05/01/22   [provider]  furosemide (LASIX) 40 MG tablet Take 40 mg by mouth daily as needed for fluid or edema.     [provider]  gabapentin (NEURONTIN) 300 MG capsule Take 300 mg by mouth at bedtime.    [provider]  HYDROcodone-acetaminophen (NORCO) 5-325 MG tablet Take 1 tablet by mouth every 6 (six) hours as needed for moderate pain. Patient not taking: Reported on 11/05/2022 07/17/22   Dagoberto Ligas, PA-C  hydrocortisone (ANUSOL-HC) 2.5 % rectal cream HC 2.5% BID x 10 days perianal area Patient not taking: Reported on 11/05/2022 02/28/22   Jackquline Denmark, MD  hydrOXYzine (ATARAX) 25 MG tablet Take 25-50 mg by mouth 2 (two) times daily as needed for dizziness or anxiety. 10/25/21   [provider]  isosorbide mononitrate (IMDUR) 30 MG 24 hr tablet Take 1 tablet (30 mg total) by mouth every morning. Patient not taking: Reported on 11/05/2022 11/06/21   Revankar, Reita Cliche, MD  magnesium oxide (MAG-OX) 400 (240 Mg)  MG tablet Take 400 mg by mouth daily.    [provider]  medroxyPROGESTERone (PROVERA) 10 MG tablet Take 10 mg by mouth daily. 04/09/17   [provider]  metoprolol succinate (TOPROL XL) 25 MG 24 hr tablet Take 0.5 tablets (12.5 mg total) by mouth daily. 11/08/22   Revankar, Reita Cliche, MD  mycophenolate (MYFORTIC) 180 MG EC tablet Take 180 mg by mouth 2 (two) times daily.    [provider]  nitroGLYCERIN (NITROSTAT) 0.4 MG SL tablet Place  0.4 mg under the tongue every 5 (five) minutes as needed for chest pain. 05/09/22   [provider]  ondansetron (ZOFRAN-ODT) 4 MG disintegrating tablet Take 4 mg by mouth as needed for nausea/vomiting. 03/25/18   [provider]  pantoprazole (PROTONIX) 40 MG tablet Take 1 tablet (40 mg total) by mouth daily. 02/28/22   Jackquline Denmark, MD  Phenylephrine-Acetaminophen (TYLENOL SINUS+HEADACHE) 5-325 MG TABS Take 2 tablets by mouth daily as needed (sinus headaches).    [provider]  Potassium Chloride ER 20 MEQ TBCR Take 20 mEq by mouth daily. Patient not taking: Reported on 11/05/2022 05/08/22   [provider]  predniSONE (DELTASONE) 1 MG tablet Take 4 mg by mouth daily. 10/29/22   [provider]  sertraline (ZOLOFT) 50 MG tablet Take 50 mg by mouth daily.    [provider]  sevelamer carbonate (RENVELA) 800 MG tablet Take 800 mg by mouth 3 (three) times daily with meals. 01/04/22   [provider]  simvastatin (ZOCOR) 5 MG tablet Take 5 mg by mouth at bedtime. 11/08/21   [provider]  sodium bicarbonate 650 MG tablet Take 1,300 mg by mouth 2 (two) times daily. 12/14/20   [provider]  tacrolimus (PROGRAF) 0.5 MG capsule Take 0.5 mg by mouth 2 (two) times daily. Take along with 1 mg capsule=1.5 mg BID    [provider]  traZODone (DESYREL) 100 MG tablet Take 100 mg by mouth at bedtime. 10/05/21   [provider]  Colchicine 0.6 MG CAPS Take 1 capsule by mouth daily. 01/24/20 02/28/20  [provider]      Allergies    Bactrim [sulfamethoxazole-trimethoprim], Sulfamethoxazole, Ambien [zolpidem], Hydrogen peroxide, Labetalol, Wound dressing adhesive, Amlodipine, Hyoscyamine, Keflex [cephalexin], Orphenadrine, Adhesive [tape], Imuran [azathioprine sodium], Neosporin [bacitracin-polymyxin b], Tramadol, and Warfarin sodium    Review of Systems   Review of Systems  All other systems reviewed and are  negative.   Physical Exam Updated Vital Signs BP 103/68   Pulse 98   Temp 98.2 F (36.8 C) (Oral)   Resp 18   LMP 09/13/2013   SpO2 99%  Physical Exam Vitals and nursing note reviewed.  Constitutional:      General: She is not in acute distress.    Appearance: Normal appearance. She is not ill-appearing, toxic-appearing or diaphoretic.  HENT:     Head: Normocephalic and atraumatic.     Mouth/Throat:     Mouth: Mucous membranes are moist.  Eyes:     General: No scleral icterus.    Extraocular Movements: Extraocular movements intact.     Conjunctiva/sclera: Conjunctivae normal.     Pupils: Pupils are equal, round, and reactive to light.  Neck:     Comments: Bilateral trapezius tenderness, no midline tenderness, step-offs, deformities, or overlying skin changes Cardiovascular:     Rate and Rhythm: Regular rhythm. Tachycardia present.     Heart sounds: No murmur heard. Pulmonary:     Effort: Pulmonary effort  is normal. No respiratory distress.     Breath sounds: Normal breath sounds. No stridor. No wheezing, rhonchi or rales.     Comments: Speaking in full sentences with ease, briskly, and without signs of distress Chest:     Chest wall: Tenderness (diffusely, mild) present.  Abdominal:     General: Abdomen is flat. There is no distension.     Palpations: Abdomen is soft.     Tenderness: There is no abdominal tenderness. There is no right CVA tenderness, left CVA tenderness, guarding or rebound.  Musculoskeletal:        General: Normal range of motion.     Cervical back: Normal range of motion and neck supple. No rigidity.     Right lower leg: No edema.     Left lower leg: No edema.     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     Capillary Refill: Capillary refill takes less than 2 seconds.  Neurological:     Mental Status: She is alert and oriented to person, place, and time.     GCS: GCS eye subscore is 4. GCS verbal subscore is 5. GCS motor subscore is 6.     Cranial Nerves: Cranial nerves 2-12 are intact.     Motor: Motor function is intact.     Coordination: Coordination is intact.  Psychiatric:        Mood and Affect: Mood normal.        Behavior: Behavior normal.     ED Results / Procedures / Treatments   Labs (all labs ordered are listed, but only abnormal results are displayed) Labs Reviewed  CBC - Abnormal; Notable for the following components:      Result Value   RBC 2.84 (*)    Hemoglobin 8.9 (*)    HCT 28.5 (*)    MCV 100.4 (*)    RDW 15.9 (*)    All other components within normal limits  COMPREHENSIVE METABOLIC PANEL - Abnormal; Notable for the following components:   CO2 19 (*)    BUN 28 (*)    Creatinine, Ser 7.34 (*)    Total Protein 6.2 (*)    Albumin 3.1 (*)    GFR, Estimated 6 (*)    All other components within normal limits  URINALYSIS, ROUTINE W REFLEX MICROSCOPIC - Abnormal; Notable for the following components:   APPearance HAZY (*)    Hgb urine dipstick SMALL (*)    Protein, ur 100 (*)    Leukocytes,Ua TRACE (*)    Bacteria, UA MANY (*)    All other components within normal limits  URINE CULTURE  LIPASE, BLOOD  TROPONIN I (HIGH SENSITIVITY)    EKG EKG Interpretation  Date/Time:  Monday November 19 2022 09:04:42 EDT Ventricular Rate:  110 PR Interval:  142 QRS Duration: 68 QT Interval:  338 QTC Calculation: 457 R Axis:   -4 Text Interpretation: Sinus tachycardia Low voltage QRS Cannot rule out Anterior infarct , age undetermined Abnormal ECG When compared with ECG of 14-Feb-2022 14:51, No significant change since last tracing Confirmed by  Garnette Gunner [phone]) on 11/19/2022 9:43:32 AM Sinus tachycardia at 110 bpm, no acute  ST-T changes, similar to previous EKG  Radiology DG Chest 2 View  Result Date: 11/19/2022 CLINICAL DATA:  Chest pain EXAM: CHEST - 2 VIEW COMPARISON:  10/11/2022 FINDINGS: Minimal left basilar scar or atelectasis. No consolidation, pneumothorax or effusion. No edema. Enlarged cardiopericardial silhouette. IMPRESSION: Enlarged cardiopericardial silhouette. Mild left basilar atelectasis or scar Electronically Signed   By: Jill Side M.D.   On: 11/19/2022 09:58    Procedures Procedures    Medications Ordered in ED Medications  oxyCODONE-acetaminophen (PERCOCET/ROXICET) 5-325 MG per tablet 2 tablet (2 tablets Oral Given 11/19/22 1034)  diphenhydrAMINE (BENADRYL) capsule 25 mg (25 mg Oral Given 11/19/22 1034)    ED Course/ Medical Decision Making/ A&P                             Medical Decision Making Amount and/or Complexity of Data Reviewed Labs: ordered. Decision-making details documented in ED Course. Radiology: ordered. ECG/medicine tests: ordered. Decision-making details documented in ED Course.  Risk Prescription drug management.   Medical Decision Making:   Francile Pekala is a 47 y.o. female who presented to the ED today with multiple complaints including chest pain, palpitations detailed above.    Patient's presentation is complicated by their history of multiple comorbidities including end-stage renal disease currently on hemodialysis.  Patient placed on continuous vitals and telemetry monitoring while in ED which was reviewed periodically.  Complete initial physical exam performed, notably the patient  was in no acute distress.  She was mildly tachycardic but had a regular rhythm and lungs were clear to auscultation.  Abdomen was soft, nontender, and without rebound, guarding, or peritoneal signs.  Patient was mentating appropriately, alert and oriented, and with nonfocal  neurologic exam..    Reviewed and confirmed nursing documentation for past medical history, family history, social history.    Initial Assessment:   With the patient's presentation of chest pain, palpitations, the emergent differential diagnosis of chest pain includes: Acute coronary syndrome, pericarditis, aortic dissection, pulmonary embolism, tension pneumothorax, and esophageal rupture.  I do not believe the patient has an emergent cause of chest pain. She has multiple complaints so somewhat difficult to narrow differential diagnosis but other urgent/non-acute considerations include, but are not limited to: chronic angina, aortic stenosis, cardiomyopathy, myocarditis, mitral valve prolapse, pulmonary hypertension, hypertrophic obstructive cardiomyopathy (HOCM), aortic insufficiency, right ventricular hypertrophy, pneumonia or other infectious process, pleuritis, bronchitis, pneumothorax, tumor, gastroesophageal reflux disease (GERD), esophageal spasm, Mallory-Weiss syndrome, peptic ulcer disease, biliary disease, pancreatitis, functional gastrointestinal pain, cervical or thoracic disk disease or arthritis, shoulder arthritis, costochondritis, subacromial bursitis, anxiety or panic attack,, chest wall tumors, thoracic outlet syndrome, mediastinitis.   This is most consistent with an acute complicated illness  Initial Plan:  Screening labs including CBC and Metabolic panel to evaluate for infectious or metabolic etiology of disease.  Lipase to evaluate for pancreatitis Urinalysis with reflex culture ordered to evaluate for UTI or relevant urologic/nephrologic pathology.  CXR to evaluate for structural/infectious intrathoracic pathology.  EKG and troponin to evaluate for cardiac pathology Objective evaluation as reviewed   Initial Study Results:   Laboratory  All laboratory results reviewed without evidence of clinically relevant pathology.   Exceptions include: Creatinine 7.34, GFR 6,  hemoglobin 8.9, UA with small amounts of hemoglobin and trace leukocytes, 11-20 squamous epithelial cells  EKG EKG was reviewed independently. ST segments without concerns for elevations.   EKG: Sinus tachycardia at 110 bpm, no acute ST-T changes, similar to previous  EKG  Radiology:  All images reviewed independently. Agree with radiology report at this time.   DG Chest 2 View  Result Date: 11/19/2022 CLINICAL DATA:  Chest pain EXAM: CHEST - 2 VIEW COMPARISON:  10/11/2022 FINDINGS: Minimal left basilar scar or atelectasis. No consolidation, pneumothorax or effusion. No edema. Enlarged cardiopericardial silhouette. IMPRESSION: Enlarged cardiopericardial silhouette. Mild left basilar atelectasis or scar Electronically Signed   By: Jill Side M.D.   On: 11/19/2022 09:58   LONG TERM MONITOR (3-14 DAYS)  Result Date: 11/07/2022 Patch Wear Time:  13 days and 14 hours (2024-02-08T16:30:09-498 to 2024-02-22T06:57:20-0500) Patient had a min HR of 58 bpm, max HR of 214 bpm, and avg HR of 91 bpm. Predominant underlying rhythm was Sinus Rhythm. 29 Supraventricular Tachycardia runs occurred, the run with the fastest interval lasting 6 beats with a max rate of 214 bpm, the longest lasting 32.1 secs with an avg rate of 101 bpm. Supraventricular Tachycardia was detected within +/- 45 seconds of symptomatic patient event(s). Isolated SVEs were rare (<1.0%), SVE Couplets were rare (<1.0%), and SVE Triplets were rare (<1.0%). Isolated VEs were rare (<1.0%), and no VE Couplets or VE Triplets were present. Impression: Abnormal event monitor with brief atrial runs as mentioned above were noted.     Final Assessment and Plan:   This is a 47 year old female with a past medical history of end-stage renal disease currently on hemodialysis who presents to the ED with somewhat vague complaints including generalized pain, generalized weakness, and concern for UTI.  Patient reports that she has pain from the back of her neck  all the way down her back as well as chest tightness.  No localizable abdominal pain.  No dysuria or hematuria.  Patient states that back pain feels similar to when she had frequent UTIs when she was younger.  No recent antibiotics.  No recent missed sessions of dialysis that she was scheduled today.  Patient states that she contacted dialysis center and if discharged from the hospital will be able to make up the session later today.  On exam, she has muscular tenderness to the neck and back but no midline spinal tenderness, step-offs, or deformities.  Abdomen soft and nontender.  No significant lower extremity edema or tenderness.  She does report chest wall tenderness which reproduces her chest pain.  Suspect that her complaints are likely musculoskeletal in nature but workup obtained as above for further evaluation.  Initial vital signs revealed a sinus tachycardia at 110 which had improved to around 100 on multiple repeat assessments.  Patient with normal oxygen saturation on room air, no signs of respiratory distress, afebrile, and otherwise stable vital signs.  Per chart review, patient tends to have a mild tachycardia at baseline.  EKG without any acute ST-T changes.  Patient with recent normal stress test by cardiology as well.  Troponin negative.  Chest x-ray with mild silhouette and enlargement but no signs of infection or significant volume overload.  Overall, workup reassuring no significant electrolyte disturbances.  Creatinine is bumped to 7.34 but expect this in the setting of end-stage renal disease needing hemodialysis.  Delay in obtaining urinalysis as patient unable to provide sample.  Patient provided with water to help with getting sample.  On reassessment, patient states her pain is well-controlled and she is feeling better.  Declined further medication management.  Will await urinalysis and if this is negative anticipate discharge home.  Discussed case with attending physician who cosigned  this note and agrees with plan.  UA does not appear acutely infected.  It does appear contaminated with squamous epithelial cells.  Will send for culture to verify due to patient's high concern for UTI.  Patient updated on all findings.  She is to contact her dialysis clinic to reschedule her session that was scheduled for this morning.  Strict ED return precautions given, all questions answered, and stable for discharge.   Clinical Impression:  1. Atypical chest pain   2. Heart palpitations   3. ESRD (end stage renal disease) ([place])   4. Body aches      Discharge           Final Clinical Impression(s) / ED Diagnoses Final diagnoses:  Heart palpitations  ESRD (end stage renal disease) ([place])  Atypical chest pain  Body aches    Rx / DC Orders ED Discharge Orders     None         Suzzette Righter, PA-C 11/19/22 1314    Cristie Hem, MD 11/20/22 [phone]

## 2022-11-19 NOTE — ED Provider Triage Note (Signed)
Emergency Medicine Provider Triage Evaluation Note  [facility] , a 47 y.o. female  was evaluated in triage.  Pt complains of "pain all over" for the last several days associated with chest tightness, nausea, generalized weakness, and heart palpitations like her heart is racing.  Patient states that she is concerned she has a UTI.  She is currently undergoing dialysis treatment Monday Wednesday Friday.  No recent missed sessions.  No shortness of breath, vomiting, diarrhea, localized abdominal pain. .  Review of Systems  Positive: See HPI Negative: See HPI  Physical Exam  BP 114/75   Pulse (!) 110   Temp 98.2 F (36.8 C) (Oral)   Resp 16   LMP 09/13/2013   SpO2 97%  Gen:   Awake, no distress   Resp:  Normal effort lungs clear to auscultation MSK:   Moves extremities without difficulty and spontaneously Other:  Tachycardia, regular rhythm, abdomen soft and nontender, no CVA tenderness  Medical Decision Making  Medically screening exam initiated at 9:25 AM.  Appropriate orders placed.  Bryleigh Dawn Bran was informed that the remainder of the evaluation will be completed by another provider, this initial triage assessment does not replace that evaluation, and the importance of remaining in the ED until their evaluation is complete.     Suzzette Righter, [place] 11/19/22 [id number]

## 2022-11-20 ENCOUNTER — Telehealth: Payer: Self-pay | Admitting: Cardiology

## 2022-11-20 LAB — URINE CULTURE: Culture: NO GROWTH

## 2022-11-20 NOTE — Telephone Encounter (Signed)
Pt c/o medication issue:  1. Name of Medication: metoprolol succinate (TOPROL XL) 25 MG 24 hr tablet   2. How are you currently taking this medication (dosage and times per day)? As prescribed  3. Are you having a reaction (difficulty breathing--STAT)?   No  4. What is your medication issue?   Patient states this medication is not working as she is still having a racing heart and her hands feel numb and tingly. Patient states she is also feels like there is a pressure on her chest.

## 2022-11-20 NOTE — Telephone Encounter (Signed)
Spoke with pt who states that she has been having chest, neck and arm pain. Pt describes pain as sharp, stabbing and worse with movement. Pt has been seen in the ED for same with a negative troponin. Pt states that she has had increased heart rate. Advised that if her heart rate is greater than 120 and her BP is greater than 120 she can try an additional 12.5 Toprol. Advised to see PCP for sx also. Pt verbalized understanding and had no additional questions.

## 2022-11-21 ENCOUNTER — Encounter: Payer: Self-pay | Admitting: Cardiology

## 2022-11-21 ENCOUNTER — Ambulatory Visit: Payer: Medicare Other | Attending: Cardiology | Admitting: Cardiology

## 2022-11-21 VITALS — BP 96/54 | HR 100 | Ht 59.0 in | Wt 170.0 lb

## 2022-11-21 DIAGNOSIS — Z992 Dependence on renal dialysis: Secondary | ICD-10-CM | POA: Diagnosis not present

## 2022-11-21 DIAGNOSIS — N186 End stage renal disease: Secondary | ICD-10-CM | POA: Diagnosis not present

## 2022-11-21 DIAGNOSIS — E669 Obesity, unspecified: Secondary | ICD-10-CM | POA: Insufficient documentation

## 2022-11-21 DIAGNOSIS — I10 Essential (primary) hypertension: Secondary | ICD-10-CM

## 2022-11-21 DIAGNOSIS — I209 Angina pectoris, unspecified: Secondary | ICD-10-CM | POA: Diagnosis not present

## 2022-11-21 MED ORDER — ASPIRIN 81 MG PO TBEC: 81.0000 mg | DELAYED_RELEASE_TABLET | Freq: Every day | ORAL | 3 refills | Status: DC

## 2022-11-21 MED ORDER — NITROGLYCERIN 0.4 MG SL SUBL: 0.4000 mg | SUBLINGUAL_TABLET | SUBLINGUAL | 6 refills | Status: AC | PRN

## 2022-11-21 NOTE — Progress Notes (Unsigned)
Cardiology Office Note:    Date:  11/21/2022   ID:  Calvert, DOB [id number], MRN [id number]  PCP:  [name], PA-C  Cardiologist:  Jenean Lindau, MD   Referring MD: [name], PA-C    ASSESSMENT:    1. Essential hypertension   2. Angina pectoris (HCC)   3. Obesity, Class I, BMI 30-34.9   4. End stage renal disease on dialysis [facility])    PLAN:    In order of problems listed above:  Angina pectoris: Patient's symptoms are very concerning.  She has undergone stress testing in the past.  Therefore I discussed the following with her.  Sublingual nitroglycerin prescription was sent, its protocol and 911 protocol explained and the patient vocalized understanding questions were answered to the patient's satisfaction.I discussed coronary angiography and left heart catheterization with the patient at extensive length. Procedure, benefits and potential risks were explained. Patient had multiple questions which were answered to the patient's satisfaction. Patient agreed and consented for the procedure. Further recommendations will be made based on the findings of the coronary angiography. In the interim. The patient has any significant symptoms he knows to go to the nearest emergency room. Essential hypertension: Blood pressure is stable and diet was emphasized. End-stage renal disease on dialysis: I discussed this with my nurse to let the Cath Lab know about this so they can potentially set forth the logistics for dialysis per protocol. She will be seen in follow-up appointment after coronary angiography.  She and her mother had multiple questions which were answered to her satisfaction.   Medication Adjustments/Labs and Tests Ordered: Current medicines are reviewed at length with the patient today.  Concerns regarding medicines are outlined above.  No orders of the defined types were placed in this encounter.  No orders of the defined types were placed in this  encounter.    No chief complaint on file.    History of Present Illness:    Ariel [name] is a 47 y.o. female.  She has past medical history of essential hypertension, dyslipidemia, end-stage renal disease on dialysis.  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  She is on dialysis.  She mentions to me that at some point her heart rate speeds up.  She also mentions to me that at other times she has chest tightness and she feels like somebody sitting on the chest and it goes to the neck.  No orthopnea or PND.  At the time of my evaluation, the patient is alert awake oriented and in no distress.  Past Medical History:  Diagnosis Date   Abdominal distension (gaseous) 04/16/2018   Abdominal pain 02/14/2022   Abnormal CT scan, gastrointestinal tract    AIN grade II    AIN grade III 04/05/2020   AKI (acute kidney injury) ([place]) 08/18/2021   Allergy, unspecified, initial encounter 08/28/2022   Anaphylactic shock, unspecified, initial encounter 08/28/2022   Anemia    Anemia associated with chronic renal failure    Anemia in chronic kidney disease 08/21/2022   Anorectal disorder 06/08/2020   Anorectal pain 06/08/2020   Anxiety disorder 10/29/2018   Back pain    Bloating 04/16/2018   Cancer ([place])    pre cervical   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    of the [place]   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 09/05/2022   Carcinoma in situ of vulva 09/05/2022   Chest pain of  uncertain etiology [id number]   Chronic kidney disease, stage V ([place]) 123456   Complications due to cardiac device, implant, and graft 09/02/2012   Condyloma of female genitalia    Dehydration 01/19/2019   Dependence on renal dialysis ([place]) 08/21/2022   Depression    Dialysis patient ([place])    not currently as of 07/02/22   Drug-induced bleeding disorder ([place]) 01/19/2019   DVT of axillary vein, acute left ([place]) 08/02/2012   Dysmenorrhea 10/19/2015   Dyspnea on  exertion 01/19/2019   Dysuria    End stage renal disease ([place]) 09/02/2012   End stage renal disease on dialysis ([place]) 11/05/2022   ESRD (end stage renal disease) ([place])    sp transplant x 2, did mix of HD and PD from 2001- 2008   Essential hypertension 11/03/2021   Essential hypertension with goal blood pressure less than 130/80    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   Fatigue    Gastritis, acute    GERD without esophagitis 12/20/2021   Gout    Gout due to renal impairment, unspecified site 08/21/2022   History of parathyroidectomy ([place]) 10/29/2018   History of renal transplant    HTN (hypertension) 01/19/2019   Hypercholesterolemia    Hyperlipidemia    Hyperphosphatemia 12/20/2021   Hypertensive chronic kidney disease with stage 1 through stage 4 chronic kidney disease, or unspecified chronic kidney disease 08/21/2022   Hypokalemia    Hypomagnesemia    Hypothyroid 01/19/2019   IBS (irritable bowel syndrome)    Immunosuppressive management encounter following kidney transplant 10/29/2018   Internal hemorrhoid 04/22/2018   Iron deficiency anemia 10/19/2015   Irregular uterine bleeding 02/13/2016   Kidney disorder 06/08/2020   Kidney transplant recipient 10/29/2018   1st transplant [facility]) was 1995- 2001.  Did HD/ PD mix from 2001- 2008. 2nd transplant ([facility]/ [facility]) was in 2008 and is still working as of 12/2021. F/b Dr Hollie Salk ([place]) and [facility].   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   Obesity, Class I, BMI 30-34.9 12/21/2021   Other chronic sinusitis 10/29/2018   Pain, unspecified 08/28/2022   Palpitations    Pancreatic duct dilated 02/15/2022   Pancreatitis    Perianal lesion 04/22/2018   Phlebitis and thrombophlebitis of other sites 08/02/2012   Plantar fasciitis 07/03/2018   Pre-transplant evaluation for kidney transplant 01/09/2022   Preop cardiovascular exam 05/09/2022   Pruritus ani 06/26/2018   Renal failure, chronic 11/03/2021   Dialysis in the past   Right lower quadrant abdominal pain 10/29/2018   21 [facility] spotted fever    S/p cadaver renal transplant 08/02/2012   Secondary hyperparathyroidism of renal origin [facility])    Secondary hypertension due to renal disease 10/29/2018   Sesamoiditis 07/03/2018   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    Transplanted organ removal status 10/29/2018   Unstable angina ([place]) 01/19/2019   Urinary tract infection    Vision loss 06/15/2022   Vulvar dystrophy  Yeast dermatitis 08/14/2018    Past Surgical History:  Procedure Laterality Date   AV FISTULA PLACEMENT     AV FISTULA PLACEMENT Right 05/17/2022   Procedure: RIGHT ARM BRACHIOCEPHALIC ARTERIOVENOUS (AV) FISTULA CREATION;  Surgeon: [name], MD;  Location: [facility] OR;  Service: Vascular;  Laterality: Right;   COLONOSCOPY  11/28/2005   [facility]   COLONOSCOPY  04/13/2016   Dr Orlena Sheldon   ESOPHAGOGASTRODUODENOSCOPY  07/31/2017   Distal esophageal ulcers (likely due to gastroeesphageal reflix-biopsied). Small hiatal hernia. Erosive gastritis.   FISTULA SUPERFICIALIZATION Right 07/17/2022   Procedure: SUPERFICIALIZATION AND REVISION RIGHT ARM FISTULA;  Surgeon: [name], MD;  Location: [facility] OR;  Service: Vascular;  Laterality: Right;   HEMORRHOID SURGERY     2019, and 04/29/2020   INSERTION OF [place], 2008   KNEE SURGERY Left 2020   PARATHYROIDECTOMY   2004   Portion on the parathyroid gland transplanted in R forearm   REMOVAL OF A DIALYSIS CATHETER     SIMPLE VULVECTOMY  06/19/2011   Partial simple left   SKIN CANCER EXCISION  2016   Per pt, area removed on scalp showed squamous cell carcinoma   TUBAL LIGATION     UPPER GASTROINTESTINAL ENDOSCOPY     WISDOM TOOTH EXTRACTION      Current Medications: Current Meds  Medication Sig   acetaminophen (TYLENOL) 500 MG tablet Take 500 mg by mouth every 6 (six) hours as needed for headache (pain).   allopurinol (ZYLOPRIM) 100 MG tablet Take 100 mg by mouth daily.   ALPRAZolam (XANAX) 0.5 MG tablet Take 0.5 mg by mouth at bedtime.    aspirin-acetaminophen-caffeine (EXCEDRIN MIGRAINE) 250-250-65 MG tablet Take 1 tablet by mouth every 6 (six) hours as needed for headache.   B Complex-C-Folic Acid (RENA-VITE RX) 1 MG TABS Take 1 tablet by mouth daily.   famotidine (PEPCID) 20 MG tablet Take 1 tablet (20 mg total) by mouth daily.   fluticasone (FLONASE) 50 MCG/ACT nasal spray Place 2 sprays into both nostrils daily as needed for allergies.   furosemide (LASIX) 40 MG tablet Take 40 mg by mouth daily as needed for fluid or edema.    gabapentin (NEURONTIN) 300 MG capsule Take 300 mg by mouth at bedtime.   HYDROcodone-acetaminophen (NORCO) 5-325 MG tablet Take 1 tablet by mouth every 6 (six) hours as needed for moderate pain.   hydrocortisone (ANUSOL-HC) 2.5 % rectal cream HC 2.5% BID x 10 days perianal area   hydrOXYzine (ATARAX) 25 MG tablet Take 25-50 mg by mouth 2 (two) times daily as needed for dizziness or anxiety.   isosorbide mononitrate (IMDUR) 30 MG 24 hr tablet Take 1 tablet (30 mg total) by mouth every morning.   magnesium oxide (MAG-OX) 400 (240 Mg) MG tablet Take 400 mg by mouth daily.   medroxyPROGESTERone (PROVERA) 10 MG tablet Take 10 mg by mouth daily.   metoprolol succinate (TOPROL XL) 25 MG 24 hr tablet Take 0.5 tablets (12.5 mg total) by mouth daily.   mycophenolate (MYFORTIC) 180  MG EC tablet Take 180 mg by mouth 2 (two) times daily.   nitroGLYCERIN (NITROSTAT) 0.4 MG SL tablet Place 0.4 mg under the tongue every 5 (five) minutes as needed for chest pain.   ondansetron (ZOFRAN-ODT) 4 MG disintegrating tablet Take 4 mg by mouth as needed for nausea/vomiting.   pantoprazole (PROTONIX) 40 MG tablet Take 1 tablet (40 mg total) by mouth daily.   Phenylephrine-Acetaminophen (  TYLENOL SINUS+HEADACHE) 5-325 MG TABS Take 2 tablets by mouth daily as needed (sinus headaches).   Potassium Chloride ER 20 MEQ TBCR Take 20 mEq by mouth daily.   predniSONE (DELTASONE) 1 MG tablet Take 4 mg by mouth daily.   sertraline (ZOLOFT) 50 MG tablet Take 50 mg by mouth daily.   sevelamer carbonate (RENVELA) 800 MG tablet Take 800 mg by mouth 3 (three) times daily with meals.   simvastatin (ZOCOR) 5 MG tablet Take 5 mg by mouth at bedtime.   sodium bicarbonate 650 MG tablet Take 1,300 mg by mouth 2 (two) times daily.   tacrolimus (PROGRAF) 0.5 MG capsule Take 0.5 mg by mouth 2 (two) times daily. Take along with 1 mg capsule=1.5 mg BID   traZODone (DESYREL) 100 MG tablet Take 100 mg by mouth at bedtime.     Allergies:   Bactrim [sulfamethoxazole-trimethoprim], Sulfamethoxazole, Ambien [zolpidem], Hydrogen peroxide, Labetalol, Wound dressing adhesive, Amlodipine, Hyoscyamine, Keflex [cephalexin], Orphenadrine, Adhesive [tape], Imuran [azathioprine sodium], Neosporin [bacitracin-polymyxin b], Tramadol, and Warfarin sodium   Social History   Socioeconomic History   Marital status: Divorced    Spouse name: Not on file   Number of children: Not on file   Years of education: Not on file   Highest education level: Not on file  Occupational History   Not on file  Tobacco Use   Smoking status: Never    Passive exposure: Never   Smokeless tobacco: Never  Vaping Use   Vaping Use: Never used  Substance and Sexual Activity   Alcohol use: No   Drug use: No   Sexual activity: Not on file  Other  Topics Concern   Not on file  Social History Narrative   Not on file   Social Determinants of Health   Financial Resource Strain: Not on file  Food Insecurity: Not on file  Transportation Needs: Not on file  Physical Activity: Not on file  Stress: Not on file  Social Connections: Not on file     Family History: The patient's family history includes Colon cancer in her maternal grandmother; Diabetes in her father; Hyperlipidemia in her mother; Hypertension in her mother; Rectal cancer in her maternal grandmother. There is no history of Esophageal cancer or Stomach cancer.  ROS:   Please see the history of present illness.    All other systems reviewed and are negative.  EKGs/Labs/Other Studies Reviewed:    The following studies were reviewed today: EKG reveals sinus rhythm poor anterior forces tachycardia and nonspecific ST-T changes   Recent Labs: 02/17/2022: Magnesium 1.9 11/19/2022: ALT 16; BUN 28; Creatinine, Ser 7.34; Hemoglobin 8.9; Platelets 199; Potassium 4.1; Sodium 135  Recent Lipid Panel No results found for: "CHOL", "TRIG", "HDL", "CHOLHDL", "VLDL", "LDLCALC", "LDLDIRECT"  Physical Exam:    VS:  BP (!) 96/54   Pulse 100   Ht 4\' 11"  (1.499 m)   Wt 170 lb 0.6 oz (77.1 kg)   LMP 09/13/2013   SpO2 94%   BMI 34.34 kg/m     Wt Readings from Last 3 Encounters:  11/21/22 170 lb 0.6 oz (77.1 kg)  11/05/22 173 lb (78.5 kg)  08/10/22 185 lb 8 oz (84.1 kg)     GEN: Patient is in no acute distress HEENT: Normal NECK: No JVD; No carotid bruits LYMPHATICS: No lymphadenopathy CARDIAC: Hear sounds regular, 2/6 systolic murmur at the apex. RESPIRATORY:  Clear to auscultation without rales, wheezing or rhonchi  ABDOMEN: Soft, non-tender, non-distended MUSCULOSKELETAL:  No edema; No deformity  SKIN: Warm and dry NEUROLOGIC:  Alert and oriented x 3 PSYCHIATRIC:  Normal affect   Signed, Jenean Lindau, MD  11/21/2022 4:30 PM    [facility]

## 2022-11-21 NOTE — H&P (View-Only) (Signed)
Cardiology Office Note:    Date:  11/21/2022   ID:  [name], DOB [id number], MRN [id number]  PCP:  [name], PA-C  Cardiologist:  Jenean Lindau, MD   Referring MD: [name], PA-C    ASSESSMENT:    1. Essential hypertension   2. Angina pectoris (HCC)   3. Obesity, Class I, BMI 30-34.9   4. End stage renal disease on dialysis [name])    PLAN:    In order of problems listed above:  Angina pectoris: Patient's symptoms are very concerning.  She has undergone stress testing in the past.  Therefore I discussed the following with her.  Sublingual nitroglycerin prescription was sent, its protocol and 911 protocol explained and the patient vocalized understanding questions were answered to the patient's satisfaction.I discussed coronary angiography and left heart catheterization with the patient at extensive length. Procedure, benefits and potential risks were explained. Patient had multiple questions which were answered to the patient's satisfaction. Patient agreed and consented for the procedure. Further recommendations will be made based on the findings of the coronary angiography. In the interim. The patient has any significant symptoms he knows to go to the nearest emergency room. Essential hypertension: Blood pressure is stable and diet was emphasized. End-stage renal disease on dialysis: I discussed this with my nurse to let the Cath Lab know about this so they can potentially set forth the logistics for dialysis per protocol. She will be seen in follow-up appointment after coronary angiography.  She and her mother had multiple questions which were answered to her satisfaction.   Medication Adjustments/Labs and Tests Ordered: Current medicines are reviewed at length with the patient today.  Concerns regarding medicines are outlined above.  No orders of the defined types were placed in this encounter.  No orders of the defined types were placed in this  encounter.    No chief complaint on file.    History of Present Illness:    Ariel [name] is a 47 y.o. female.  She has past medical history of essential hypertension, dyslipidemia, end-stage renal disease on dialysis.  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  She is on dialysis.  She mentions to me that at some point her heart rate speeds up.  She also mentions to me that at other times she has chest tightness and she feels like somebody sitting on the chest and it goes to the neck.  No orthopnea or PND.  At the time of my evaluation, the patient is alert awake oriented and in no distress.  Past Medical History:  Diagnosis Date   Abdominal distension (gaseous) 04/16/2018   Abdominal pain 02/14/2022   Abnormal CT scan, gastrointestinal tract    AIN grade II    AIN grade III 04/05/2020   AKI (acute kidney injury) ([employer]) 08/18/2021   Allergy, unspecified, initial encounter 08/28/2022   Anaphylactic shock, unspecified, initial encounter 08/28/2022   Anemia    Anemia associated with chronic renal failure    Anemia in chronic kidney disease 08/21/2022   Anorectal disorder 06/08/2020   Anorectal pain 06/08/2020   Anxiety disorder 10/29/2018   Back pain    Bloating 04/16/2018   Cancer ([place])    pre cervical   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    of the [place]   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 09/05/2022   Carcinoma in situ of vulva 09/05/2022   Chest pain of  uncertain etiology [id number]   Chronic kidney disease, stage V ([place]) 123456   Complications due to cardiac device, implant, and graft 09/02/2012   Condyloma of female genitalia    Dehydration 01/19/2019   Dependence on renal dialysis ([place]) 08/21/2022   Depression    Dialysis patient ([place])    not currently as of 07/02/22   Drug-induced bleeding disorder ([place]) 01/19/2019   DVT of axillary vein, acute left ([place]) 08/02/2012   Dysmenorrhea 10/19/2015   Dyspnea on  exertion 01/19/2019   Dysuria    End stage renal disease ([place]) 09/02/2012   End stage renal disease on dialysis ([place]) 11/05/2022   ESRD (end stage renal disease) ([place])    sp transplant x 2, did mix of HD and PD from 2001- 2008   Essential hypertension 11/03/2021   Essential hypertension with goal blood pressure less than 130/80    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   Fatigue    Gastritis, acute    GERD without esophagitis 12/20/2021   Gout    Gout due to renal impairment, unspecified site 08/21/2022   History of parathyroidectomy ([place]) 10/29/2018   History of renal transplant    HTN (hypertension) 01/19/2019   Hypercholesterolemia    Hyperlipidemia    Hyperphosphatemia 12/20/2021   Hypertensive chronic kidney disease with stage 1 through stage 4 chronic kidney disease, or unspecified chronic kidney disease 08/21/2022   Hypokalemia    Hypomagnesemia    Hypothyroid 01/19/2019   IBS (irritable bowel syndrome)    Immunosuppressive management encounter following kidney transplant 10/29/2018   Internal hemorrhoid 04/22/2018   Iron deficiency anemia 10/19/2015   Irregular uterine bleeding 02/13/2016   Kidney disorder 06/08/2020   Kidney transplant recipient 10/29/2018   1st transplant [facility]) was 1995- 2001.  Did HD/ PD mix from 2001- 2008. 2nd transplant ([facility]/ [facility]) was in 2008 and is still working as of 12/2021. F/b Dr Hollie Salk ([place]) and [facility]   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   Obesity, Class I, BMI 30-34.9 12/21/2021   Other chronic sinusitis 10/29/2018   Pain, unspecified 08/28/2022   Palpitations    Pancreatic duct dilated 02/15/2022   Pancreatitis    Perianal lesion 04/22/2018   Phlebitis and thrombophlebitis of other sites 08/02/2012   Plantar fasciitis 07/03/2018   Pre-transplant evaluation for kidney transplant 01/09/2022   Preop cardiovascular exam 05/09/2022   Pruritus ani 06/26/2018   Renal failure, chronic 11/03/2021   Dialysis in the past   Right lower quadrant abdominal pain 10/29/2018   [facility] spotted fever    S/p cadaver renal transplant 08/02/2012   Secondary hyperparathyroidism of renal origin [facility])    Secondary hypertension due to renal disease 10/29/2018   Sesamoiditis 07/03/2018   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    Transplanted organ removal status 10/29/2018   Unstable angina ([place]) 01/19/2019   Urinary tract infection    Vision loss 06/15/2022   Vulvar dystrophy  Yeast dermatitis 08/14/2018    Past Surgical History:  Procedure Laterality Date   AV FISTULA PLACEMENT     AV FISTULA PLACEMENT Right 05/17/2022   Procedure: RIGHT ARM BRACHIOCEPHALIC ARTERIOVENOUS (AV) FISTULA CREATION;  Surgeon: [name], MD;  Location: [facility] OR;  Service: Vascular;  Laterality: Right;   COLONOSCOPY  11/28/2005   [facility]   COLONOSCOPY  04/13/2016   Dr Orlena Sheldon   ESOPHAGOGASTRODUODENOSCOPY  07/31/2017   Distal esophageal ulcers (likely due to gastroeesphageal reflix-biopsied). Small hiatal hernia. Erosive gastritis.   FISTULA SUPERFICIALIZATION Right 07/17/2022   Procedure: SUPERFICIALIZATION AND REVISION RIGHT ARM FISTULA;  Surgeon: [name], MD;  Location: [facility] OR;  Service: Vascular;  Laterality: Right;   HEMORRHOID SURGERY     2019, and 04/29/2020   INSERTION OF [place], 2008   KNEE SURGERY Left 2020   PARATHYROIDECTOMY   2004   Portion on the parathyroid gland transplanted in R forearm   REMOVAL OF A DIALYSIS CATHETER     SIMPLE VULVECTOMY  06/19/2011   Partial simple left   SKIN CANCER EXCISION  2016   Per pt, area removed on scalp showed squamous cell carcinoma   TUBAL LIGATION     UPPER GASTROINTESTINAL ENDOSCOPY     WISDOM TOOTH EXTRACTION      Current Medications: Current Meds  Medication Sig   acetaminophen (TYLENOL) 500 MG tablet Take 500 mg by mouth every 6 (six) hours as needed for headache (pain).   allopurinol (ZYLOPRIM) 100 MG tablet Take 100 mg by mouth daily.   ALPRAZolam (XANAX) 0.5 MG tablet Take 0.5 mg by mouth at bedtime.    aspirin-acetaminophen-caffeine (EXCEDRIN MIGRAINE) 250-250-65 MG tablet Take 1 tablet by mouth every 6 (six) hours as needed for headache.   B Complex-C-Folic Acid (RENA-VITE RX) 1 MG TABS Take 1 tablet by mouth daily.   famotidine (PEPCID) 20 MG tablet Take 1 tablet (20 mg total) by mouth daily.   fluticasone (FLONASE) 50 MCG/ACT nasal spray Place 2 sprays into both nostrils daily as needed for allergies.   furosemide (LASIX) 40 MG tablet Take 40 mg by mouth daily as needed for fluid or edema.    gabapentin (NEURONTIN) 300 MG capsule Take 300 mg by mouth at bedtime.   HYDROcodone-acetaminophen (NORCO) 5-325 MG tablet Take 1 tablet by mouth every 6 (six) hours as needed for moderate pain.   hydrocortisone (ANUSOL-HC) 2.5 % rectal cream HC 2.5% BID x 10 days perianal area   hydrOXYzine (ATARAX) 25 MG tablet Take 25-50 mg by mouth 2 (two) times daily as needed for dizziness or anxiety.   isosorbide mononitrate (IMDUR) 30 MG 24 hr tablet Take 1 tablet (30 mg total) by mouth every morning.   magnesium oxide (MAG-OX) 400 (240 Mg) MG tablet Take 400 mg by mouth daily.   medroxyPROGESTERone (PROVERA) 10 MG tablet Take 10 mg by mouth daily.   metoprolol succinate (TOPROL XL) 25 MG 24 hr tablet Take 0.5 tablets (12.5 mg total) by mouth daily.   mycophenolate (MYFORTIC) 180  MG EC tablet Take 180 mg by mouth 2 (two) times daily.   nitroGLYCERIN (NITROSTAT) 0.4 MG SL tablet Place 0.4 mg under the tongue every 5 (five) minutes as needed for chest pain.   ondansetron (ZOFRAN-ODT) 4 MG disintegrating tablet Take 4 mg by mouth as needed for nausea/vomiting.   pantoprazole (PROTONIX) 40 MG tablet Take 1 tablet (40 mg total) by mouth daily.   Phenylephrine-Acetaminophen (  TYLENOL SINUS+HEADACHE) 5-325 MG TABS Take 2 tablets by mouth daily as needed (sinus headaches).   Potassium Chloride ER 20 MEQ TBCR Take 20 mEq by mouth daily.   predniSONE (DELTASONE) 1 MG tablet Take 4 mg by mouth daily.   sertraline (ZOLOFT) 50 MG tablet Take 50 mg by mouth daily.   sevelamer carbonate (RENVELA) 800 MG tablet Take 800 mg by mouth 3 (three) times daily with meals.   simvastatin (ZOCOR) 5 MG tablet Take 5 mg by mouth at bedtime.   sodium bicarbonate 650 MG tablet Take 1,300 mg by mouth 2 (two) times daily.   tacrolimus (PROGRAF) 0.5 MG capsule Take 0.5 mg by mouth 2 (two) times daily. Take along with 1 mg capsule=1.5 mg BID   traZODone (DESYREL) 100 MG tablet Take 100 mg by mouth at bedtime.     Allergies:   Bactrim [sulfamethoxazole-trimethoprim], Sulfamethoxazole, Ambien [zolpidem], Hydrogen peroxide, Labetalol, Wound dressing adhesive, Amlodipine, Hyoscyamine, Keflex [cephalexin], Orphenadrine, Adhesive [tape], Imuran [azathioprine sodium], Neosporin [bacitracin-polymyxin b], Tramadol, and Warfarin sodium   Social History   Socioeconomic History   Marital status: Divorced    Spouse name: Not on file   Number of children: Not on file   Years of education: Not on file   Highest education level: Not on file  Occupational History   Not on file  Tobacco Use   Smoking status: Never    Passive exposure: Never   Smokeless tobacco: Never  Vaping Use   Vaping Use: Never used  Substance and Sexual Activity   Alcohol use: No   Drug use: No   Sexual activity: Not on file  Other  Topics Concern   Not on file  Social History Narrative   Not on file   Social Determinants of Health   Financial Resource Strain: Not on file  Food Insecurity: Not on file  Transportation Needs: Not on file  Physical Activity: Not on file  Stress: Not on file  Social Connections: Not on file     Family History: The patient's family history includes Colon cancer in her maternal grandmother; Diabetes in her father; Hyperlipidemia in her mother; Hypertension in her mother; Rectal cancer in her maternal grandmother. There is no history of Esophageal cancer or Stomach cancer.  ROS:   Please see the history of present illness.    All other systems reviewed and are negative.  EKGs/Labs/Other Studies Reviewed:    The following studies were reviewed today: EKG reveals sinus rhythm poor anterior forces tachycardia and nonspecific ST-T changes   Recent Labs: 02/17/2022: Magnesium 1.9 11/19/2022: ALT 16; BUN 28; Creatinine, Ser 7.34; Hemoglobin 8.9; Platelets 199; Potassium 4.1; Sodium 135  Recent Lipid Panel No results found for: "CHOL", "TRIG", "HDL", "CHOLHDL", "VLDL", "LDLCALC", "LDLDIRECT"  Physical Exam:    VS:  BP (!) 96/54   Pulse 100   Ht 4\' 11"  (1.499 m)   Wt 170 lb 0.6 oz (77.1 kg)   LMP 09/13/2013   SpO2 94%   BMI 34.34 kg/m     Wt Readings from Last 3 Encounters:  11/21/22 170 lb 0.6 oz (77.1 kg)  11/05/22 173 lb (78.5 kg)  08/10/22 185 lb 8 oz (84.1 kg)     GEN: Patient is in no acute distress HEENT: Normal NECK: No JVD; No carotid bruits LYMPHATICS: No lymphadenopathy CARDIAC: Hear sounds regular, 2/6 systolic murmur at the apex. RESPIRATORY:  Clear to auscultation without rales, wheezing or rhonchi  ABDOMEN: Soft, non-tender, non-distended MUSCULOSKELETAL:  No edema; No deformity  SKIN: Warm and dry NEUROLOGIC:  Alert and oriented x 3 PSYCHIATRIC:  Normal affect   Signed, Jenean Lindau, MD  11/21/2022 4:30 PM    [facility]

## 2022-11-21 NOTE — Patient Instructions (Addendum)
Medication Instructions:  Your physician has recommended you make the following change in your medication:   Use nitroglycerin 1 tablet placed under the tongue at the first sign of chest pain or an angina attack. 1 tablet may be used every 5 minutes as needed, for up to 15 minutes. Do not take more than 3 tablets in 15 minutes. If pain persist call 911 or go to the nearest ED.   *If you need a refill on your cardiac medications before your next appointment, please call your pharmacy*   Lab Work: Your physician recommends that you return for lab work in: the next few days for a CBC and BMP You can come Monday through Friday 8:30 am to 12:00 pm and 1:15 to 4:30. You do not need to make an appointment as the order has already been placed.    If you have labs (blood work) drawn today and your tests are completely normal, you will receive your results only by: [place] (if you have MyChart) OR A paper copy in the mail If you have any lab test that is abnormal or we need to change your treatment, we will call you to review the results.   Testing/Procedures:  [facility] [place], [place] [id number] [place] [address] Dept: [phone] Loc: [place]  [phone]  You are scheduled for a Cardiac Catheterization on Thursday, March 28 with Dr. Lenna Sciara.  1. Please arrive at the [facility] (Main Entrance A) at [facility]: [address] [place], [place] [address] at 10:00 AM (This time is two hours before your procedure to ensure your preparation). Free valet parking service is available.   Special note: Every effort is made to have your procedure done on time. Please understand that emergencies sometimes delay scheduled procedures.  2. Diet: Do not eat solid foods after midnight.  The patient may have clear liquids until  5am upon the day of the procedure.  3. 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 You do not need to be fasting.  4. Medication instructions in preparation for your procedure:   Contrast Allergy: No  Stop taking, Lasix (Furosemide)  Thursday, March 28,  On the morning of your procedure, take your Aspirin 81 mg and any morning medicines NOT listed above.  You may use sips of water.  5. Plan for one night stay--bring personal belongings. 6. Bring a current list of your medications and current insurance cards. 7. You MUST have a responsible person to drive you home. 8. Someone MUST be with you the first 24 hours after you arrive home or your discharge will be delayed. 9. Please wear clothes that are easy to get on and off and wear slip-on shoes.  Thank you for allowing [place] to care for you!   -- [facility]    Follow-Up: At [facility], you and your health needs are our priority.  As part of our continuing mission to provide you with exceptional heart care, we have created designated Provider Care Teams.  These Care Teams include your primary Cardiologist (physician) and Advanced Practice Providers (APPs -  Physician Assistants and Nurse Practitioners) who all work together to provide you with the care you need, when you need it.  We recommend signing up for  the patient portal called "MyChart".  Sign up information is provided on this After Visit Summary.  MyChart is used to connect with patients for Virtual Visits (Telemedicine).  Patients are able to view lab/test results, encounter notes, upcoming appointments, etc.  Non-urgent messages can be sent to your provider as well.   To learn more about what you can do with MyChart, go to [email or web address].    Your next appointment:   1 month(s)  The format for your next appointment:   In Person  Provider:   Jyl Heinz,  MD   Other Instructions  Coronary Angiogram With Stent Coronary angiogram with stent placement is a procedure to widen or open a narrow blood vessel of the heart (coronary artery). Arteries may become blocked by cholesterol buildup (plaques) in the lining of the artery wall. When a coronary artery becomes partially blocked, blood flow to that area decreases. This may lead to chest pain or a heart attack (myocardial infarction). A stent is a small piece of metal that looks like mesh or spring. Stent placement may be done as treatment after a heart attack, or to prevent a heart attack if a blocked artery is found by a coronary angiogram. Let your health care provider know about: Any allergies you have, including allergies to medicines or contrast dye. All medicines you are taking, including vitamins, herbs, eye drops, creams, and over-the-counter medicines. Any problems you or family members have had with anesthetic medicines. Any blood disorders you have. Any surgeries you have had. Any medical conditions you have, including kidney problems or kidney failure. Whether you are pregnant or may be pregnant. Whether you are breastfeeding. What are the risks? Generally, this is a safe procedure. However, serious problems may occur, including: Damage to nearby structures or organs, such as the heart, blood vessels, or kidneys. A return of blockage. Bleeding, infection, or bruising at the insertion site. A collection of blood under the skin (hematoma) at the insertion site. A blood clot in another part of the body. Allergic reaction to medicines or dyes. Bleeding into the abdomen (retroperitoneal bleeding). Stroke (rare). Heart attack (rare). What happens before the procedure? Staying hydrated Follow instructions from your health care provider about hydration, which may include: Up to 2 hours before the procedure - you may continue to drink clear liquids, such as water, clear fruit juice, black  coffee, and plain tea.    Eating and drinking restrictions Follow instructions from your health care provider about eating and drinking, which may include: 8 hours before the procedure - stop eating heavy meals or foods, such as meat, fried foods, or fatty foods. 6 hours before the procedure - stop eating light meals or foods, such as toast or cereal. 2 hours before the procedure - stop drinking clear liquids. Medicines Ask your health care provider about: Changing or stopping your regular medicines. This is especially important if you are taking diabetes medicines or blood thinners. Taking medicines such as aspirin and ibuprofen. These medicines can thin your blood. Do not take these medicines unless your health care provider tells you to take them. Generally, aspirin is recommended before a thin tube, called a catheter, is passed through a blood vessel and inserted into the heart (cardiac catheterization). Taking over-the-counter medicines, vitamins, herbs, and supplements. General instructions Do not use any products that contain nicotine or tobacco for at least 4 weeks before the procedure. These products include cigarettes, e-cigarettes, and chewing tobacco. If you need help quitting, ask your health care provider.  Plan to have someone take you home from the hospital or clinic. If you will be going home right after the procedure, plan to have someone with you for 24 hours. You may have tests and imaging procedures. Ask your health care provider: How your insertion site will be marked. Ask which artery will be used for the procedure. What steps will be taken to help prevent infection. These may include: Removing hair at the insertion site. Washing skin with a germ-killing soap. Taking antibiotic medicine. What happens during the procedure? An IV will be inserted into one of your veins. Electrodes may be placed on your chest to monitor your heart rate during the procedure. You will be  given one or more of the following: A medicine to help you relax (sedative). A medicine to numb the area (local anesthetic) for catheter insertion. A small incision will be made for catheter insertion. The catheter will be inserted into an artery using a guide wire. The location may be in your groin, your wrist, or the fold of your arm (near your elbow). An X-ray procedure (fluoroscopy) will be used to help guide the catheter to the opening of the heart arteries. A dye will be injected into the catheter. X-rays will be taken. The dye helps to show where any narrowing or blockages are located in the arteries. Tell your health care provider if you have chest pain or trouble breathing. A tiny wire will be guided to the blocked spot, and a balloon will be inflated to make the artery wider. The stent will be expanded to crush the plaques into the wall of the vessel. The stent will hold the area open and improve the blood flow. Most stents have a drug coating to reduce the risk of the stent narrowing over time. The artery may be made wider using a drill, laser, or other tools that remove plaques. The catheter will be removed when the blood flow improves. The stent will stay where it was placed, and the lining of the artery will grow over it. A bandage (dressing) will be placed on the insertion site. Pressure will be applied to stop bleeding. The IV will be removed. This procedure may vary among health care providers and hospitals.    What happens after the procedure? Your blood pressure, heart rate, breathing rate, and blood oxygen level will be monitored until you leave the hospital or clinic. If the procedure is done through the leg, you will lie flat in bed for a few hours or for as long as told by your health care provider. You will be instructed not to bend or cross your legs. The insertion site and the pulse in your foot or wrist will be checked often. You may have more blood tests, X-rays, and a  test that records the electrical activity of your heart (electrocardiogram, or ECG). Do not drive for 24 hours if you were given a sedative during your procedure. Summary Coronary angiogram with stent placement is a procedure to widen or open a narrowed coronary artery. This is done to treat heart problems. Before the procedure, let your health care provider know about all the medical conditions and surgeries you have or have had. This is a safe procedure. However, some problems may occur, including damage to nearby structures or organs, bleeding, blood clots, or allergies. Follow your health care provider's instructions about eating, drinking, medicines, and other lifestyle changes, such as quitting tobacco use before the procedure. This information is not intended to replace  advice given to you by your health care provider. Make sure you discuss any questions you have with your health care provider. Document Revised: 03/11/2019 Document Reviewed: 03/11/2019 Elsevier Patient Education  2021 [place].  Aspirin and Your Heart Aspirin is a medicine that prevents the platelets in your blood from sticking together. Platelets are the cells that your blood uses for clotting. Aspirin can be used to help reduce the risk of blood clots, heart attacks, and other heart-related problems. What are the risks? Daily use of aspirin can cause side effects. Some of these include: Bleeding. Bleeding can be minor or serious. An example of minor bleeding is bleeding from a cut, and the bleeding does not stop. An example of more serious bleeding is stomach bleeding or, rarely, bleeding into the brain. Your risk of bleeding increases if you are also taking NSAIDs, such as ibuprofen. Increased bruising. Upset stomach. An allergic reaction. People who have growths inside the nose (nasal polyps) have an increased risk of developing an aspirin allergy. How to use aspirin to care for your heart Take aspirin only as  told by your health care provider. Make sure that you understand how much to take and what form to take. The two forms of aspirin are: Non-enteric-coated.This type of aspirin does not have a coating and is absorbed quickly. This type of aspirin also comes in a chewable form. Enteric-coated. This type of aspirin has a coating that releases the medicine very slowly. Enteric-coated aspirin might cause less stomach upset than non-enteric-coated aspirin. This type of aspirin should not be chewed or crushed. Work with your health care provider to find out whether it is safe and beneficial for you to take aspirin daily. Taking aspirin daily may be helpful if: You have had a heart attack or chest pain, or you are at risk for a heart attack. You have a condition in which certain heart vessels are blocked (coronary artery disease), and you have had a procedure to treat it. Examples are: Open-heart surgery, such as coronary artery bypass surgery (CABG). Coronary angioplasty,which is done to widen a blood vessel of your heart. Having a small mesh tube, or stent, placed in your coronary artery. You have had certain types of stroke or a mini-stroke known as a transient ischemic attack (TIA). You have a narrowing of the arteries that supply the limbs (peripheral artery disease, or PAD). You have long-term (chronic) heart rhythm problems, such as atrial fibrillation, and your health care provider thinks aspirin may help. You have valve disease or have had surgery on a valve. You are considered at increased risk of developing coronary artery disease or PAD.    Follow these instructions at home Medicines Take over-the-counter and prescription medicines only as told by your health care provider. If you are taking blood thinners: Talk with your health care provider before you take any medicines that contain aspirin or NSAIDs, such as ibuprofen. These medicines increase your risk for dangerous bleeding. Take your  medicine exactly as told, at the same time every day. Avoid activities that could cause injury or bruising, and follow instructions about how to prevent falls. Wear a medical alert bracelet or carry a card that lists what medicines you take. General instructions Do not drink alcohol if: Your health care provider tells you not to drink. You are pregnant, may be pregnant, or are planning to become pregnant. If you drink alcohol: Limit how much you use to: 0-1 drink a day for women. 0-2 drinks a day  for men. Be aware of how much alcohol is in your drink. In the [place] one drink equals one 12 oz bottle of beer (355 mL), one 5 oz glass of wine (148 mL), or one 1 oz glass of hard liquor (44 mL). Keep all follow-up visits as told by your health care provider. This is important. Where to find more information The American Heart Association: www.heart.org Contact a health care provider if you have: Unusual bleeding or bruising. Stomach pain or nausea. Ringing in your ears. An allergic reaction that causes hives, itchy skin, or swelling of the lips, tongue, or face. Get help right away if: You notice that your bowel movements are bloody, or dark red or black in color. You vomit or cough up blood. You have blood in your urine. You cough, breathe loudly (wheeze), or feel short of breath. You have chest pain, especially if the pain spreads to your arms, back, neck, or jaw. You have a headache with confusion. You have any symptoms of a stroke. "BE FAST" is an easy way to remember the main warning signs of a stroke: B - Balance. Signs are dizziness, sudden trouble walking, or loss of balance. E - Eyes. Signs are trouble seeing or a sudden change in vision. F - Face. Signs are sudden weakness or numbness of the face, or the face or eyelid drooping on one side. A - Arms. Signs are weakness or numbness in an arm. This happens suddenly and usually on one side of the body. S - Speech. Signs are sudden  trouble speaking, slurred speech, or trouble understanding what people say. T - Time. Time to call emergency services. Write down what time symptoms started. You have other signs of a stroke, such as: A sudden, severe headache with no known cause. Nausea or vomiting. Seizure. These symptoms may represent a serious problem that is an emergency. Do not wait to see if the symptoms will go away. Get medical help right away. Call your local emergency services (911 in the [place] Do not drive yourself to the hospital. Summary Aspirin use can help reduce the risk of blood clots, heart attacks, and other heart-related problems. Daily use of aspirin can cause side effects. Take aspirin only as told by your health care provider. Make sure that you understand how much to take and what form to take. Your health care provider will help you determine whether it is safe and beneficial for you to take aspirin daily. This information is not intended to replace advice given to you by your health care provider. Make sure you discuss any questions you have with your health care provider. Document Revised: 05/25/2019 Document Reviewed: 05/25/2019 Elsevier Patient Education  2021 [place]. Nitroglycerin sublingual tablets What is this medicine? NITROGLYCERIN (nye troe GLI ser in) is a type of vasodilator. It relaxes blood vessels, increasing the blood and oxygen supply to your heart. This medicine is used to relieve chest pain caused by angina. It is also used to prevent chest pain before activities like climbing stairs, going outdoors in cold weather, or sexual activity. This medicine may be used for other purposes; ask your health care provider or pharmacist if you have questions. COMMON BRAND NAME(S): Nitroquick, Nitrostat, Nitrotab What should I tell my health care provider before I take this medicine? They need to know if you have any of these conditions: anemia head injury, recent stroke, or bleeding in  the brain liver disease previous heart attack an unusual or allergic reaction to nitroglycerin,  other medicines, foods, dyes, or preservatives pregnant or trying to get pregnant breast-feeding How should I use this medicine? Take this medicine by mouth as needed. Use at the first sign of an angina attack (chest pain or tightness). You can also take this medicine 5 to 10 minutes before an event likely to produce chest pain. Follow the directions exactly as written on the prescription label. Place one tablet under your tongue and let it dissolve. Do not swallow whole. Replace the dose if you accidentally swallow it. It will help if your mouth is not dry. Saliva around the tablet will help it to dissolve more quickly. Do not eat or drink, smoke or chew tobacco while a tablet is dissolving. Sit down when taking this medicine. In an angina attack, you should feel better within 5 minutes after your first dose. You can take a dose every 5 minutes up to a total of 3 doses. If you do not feel better or feel worse after 1 dose, call 9-1-1 at once. Do not take more than 3 doses in 15 minutes. Your health care provider might give you other directions. Follow those directions if he or she does. Do not take your medicine more often than directed. Talk to your health care provider about the use of this medicine in children. Special care may be needed. Overdosage: If you think you have taken too much of this medicine contact a poison control center or emergency room at once. NOTE: This medicine is only for you. Do not share this medicine with others. What if I miss a dose? This does not apply. This medicine is only used as needed. What may interact with this medicine? Do not take this medicine with any of the following medications: certain migraine medicines like ergotamine and dihydroergotamine (DHE) medicines used to treat erectile dysfunction like sildenafil, tadalafil, and vardenafil riociguat This medicine  may also interact with the following medications: alteplase aspirin heparin medicines for high blood pressure medicines for mental depression other medicines used to treat angina phenothiazines like chlorpromazine, mesoridazine, prochlorperazine, thioridazine This list may not describe all possible interactions. Give your health care provider a list of all the medicines, herbs, non-prescription drugs, or dietary supplements you use. Also tell them if you smoke, drink alcohol, or use illegal drugs. Some items may interact with your medicine. What should I watch for while using this medicine? Tell your doctor or health care professional if you feel your medicine is no longer working. Keep this medicine with you at all times. Sit or lie down when you take your medicine to prevent falling if you feel dizzy or faint after using it. Try to remain calm. This will help you to feel better faster. If you feel dizzy, take several deep breaths and lie down with your feet propped up, or bend forward with your head resting between your knees. You may get drowsy or dizzy. Do not drive, use machinery, or do anything that needs mental alertness until you know how this drug affects you. Do not stand or sit up quickly, especially if you are an older patient. This reduces the risk of dizzy or fainting spells. Alcohol can make you more drowsy and dizzy. Avoid alcoholic drinks. Do not treat yourself for coughs, colds, or pain while you are taking this medicine without asking your doctor or health care professional for advice. Some ingredients may increase your blood pressure. What side effects may I notice from receiving this medicine? Side effects that you should report  to your doctor or health care professional as soon as possible: allergic reactions (skin rash, itching or hives; swelling of the face, lips, or tongue) low blood pressure (dizziness; feeling faint or lightheaded, falls; unusually weak or tired) low red  blood cell counts (trouble breathing; feeling faint; lightheaded, falls; unusually weak or tired) Side effects that usually do not require medical attention (report to your doctor or health care professional if they continue or are bothersome): facial flushing (redness) headache nausea, vomiting This list may not describe all possible side effects. Call your doctor for medical advice about side effects. You may report side effects to FDA at 1-800-FDA-1088. Where should I keep my medicine? Keep out of the reach of children. Store at room temperature between 20 and 25 degrees C (68 and 77 degrees F). Store in [employer]. Protect from light and moisture. Keep tightly closed. Throw away any unused medicine after the expiration date. NOTE: This sheet is a summary. It may not cover all possible information. If you have questions about this medicine, talk to your doctor, pharmacist, or health care provider.  2021 Elsevier/Gold Standard (2018-05-21 16:46:32)    FDA-cleared personal EKG: The world's most clinically validated personal EKG, FDA-cleared to detect Atrial Fibrillation, Bradycardia, and Tachycardia. Evalee Mutton is the most reliable way to check in on your heart from home. Take your EKG from anywhere: Capture a medical-grade EKG in 30 seconds and get an instant analysis right on your smartphone. Evalee Mutton is small enough to fit in your pocket, so you can take it with you anywhere. Easy to use: Simply place your fingers on the sensors--no wires, patches, or gels. Recommended by doctors: A trusted resource, Evalee Mutton is the #1 doctor-recommended personal EKG with more than 100 million EKGs recorded. Save or share your EKGs: With the press of a button, email your EKGs to your doctor or save them on your phone. Works with smartphones: Compatible with [employer] manager and tablets. Check our compatibility chart. FSA/HSA eligible: Purchase using an FSA or HSA account  (please confirm coverage with your insurance provider). Phone clip included with purchase, a $15 value. Conveniently take your device with you wherever you go.  https://store.BasicBling.tn   Step One- Record your EKG strip on [facility] app.   Step two- On Kardia EKG click "Download"   Step three- It will prompt you to make a password for this EKG. Please make the password "[email or web address]" so that we can view it.   Step four- Click on the little "upload" button (small box with an arrow in the middle) in the bottom left-hand corner of the screen.   Step five- Click "Save to Files"  Step six- Click on "On my iphone" and then "Pages" then press save in the top right-hand corner.   NOW GO TO MYCHART   Once on MyChart click "Messages"  Step one- Click "Send a message"  Step two- Click "Ask a medical question"   Step three- Click "Non urgent medical question"   Step four- Click on Rajan Revankar's name.  Step five- Click on the small paperclip at the bottom of the screen  Step six- Click "Choose file"  Step seven- Pick the most recent EKG strip listed.   Once uploaded send the message!

## 2022-11-23 ENCOUNTER — Telehealth: Payer: Self-pay

## 2022-11-23 NOTE — Telephone Encounter (Signed)
     Patient  visit on 11/19/2022  at [facility] was for Palpitations.  Have you been able to follow up with your primary care physician? Patient followed up with Cardiologist.  The patient was or was not able to obtain any needed medicine or equipment. No medication prescribed.  Are there diet recommendations that you are having difficulty following? No  Patient expresses understanding of discharge instructions and education provided has no other needs at this time. Yes   [place] Resource Care Guide   ??[email or web address]  ?? [id number]   Website: [email or web address]  [email or web address]

## 2022-11-26 ENCOUNTER — Encounter: Payer: Self-pay | Admitting: Cardiology

## 2022-11-27 DIAGNOSIS — I209 Angina pectoris, unspecified: Secondary | ICD-10-CM | POA: Diagnosis not present

## 2022-11-27 DIAGNOSIS — D071 Carcinoma in situ of vulva: Secondary | ICD-10-CM | POA: Diagnosis not present

## 2022-11-27 DIAGNOSIS — N186 End stage renal disease: Secondary | ICD-10-CM | POA: Diagnosis not present

## 2022-11-27 DIAGNOSIS — Z992 Dependence on renal dialysis: Secondary | ICD-10-CM | POA: Diagnosis not present

## 2022-11-27 DIAGNOSIS — E43 Unspecified severe protein-calorie malnutrition: Secondary | ICD-10-CM | POA: Insufficient documentation

## 2022-11-28 ENCOUNTER — Telehealth: Payer: Self-pay | Admitting: *Deleted

## 2022-11-28 LAB — CBC
Hematocrit: 28.2 % — ABNORMAL LOW (ref 34.0–46.6)
Hemoglobin: 8.8 g/dL — ABNORMAL LOW (ref 11.1–15.9)
MCH: 29.4 pg (ref 26.6–33.0)
MCHC: 31.2 g/dL — ABNORMAL LOW (ref 31.5–35.7)
MCV: 94 fL (ref 79–97)
Platelets: 222 10*3/uL (ref 150–450)
RBC: 2.99 x10E6/uL — ABNORMAL LOW (ref 3.77–5.28)
RDW: 14.6 % (ref 11.7–15.4)
WBC: 4.9 10*3/uL (ref 3.4–10.8)

## 2022-11-28 LAB — BASIC METABOLIC PANEL
BUN/Creatinine Ratio: 2 — ABNORMAL LOW (ref 9–23)
BUN: 12 mg/dL (ref 6–24)
CO2: 20 mmol/L (ref 20–29)
Calcium: 9.6 mg/dL (ref 8.7–10.2)
Chloride: 102 mmol/L (ref 96–106)
Creatinine, Ser: 5.22 mg/dL — ABNORMAL HIGH (ref 0.57–1.00)
Glucose: 128 mg/dL — ABNORMAL HIGH (ref 70–99)
Potassium: 4.1 mmol/L (ref 3.5–5.2)
Sodium: 142 mmol/L (ref 134–144)
eGFR: 10 mL/min/{1.73_m2} — ABNORMAL LOW (ref >59)

## 2022-11-28 NOTE — Telephone Encounter (Signed)
Cardiac Catheterization scheduled at [facility] for: Thursday November 29, 2022 [place] Entrance A at: 10 AM  Nothing to eat after midnight prior to procedure, clear liquids until 5 AM day of procedure.  Medication instructions: -Lasix -AM of procedure (pt reports she takes prn) -Other usual morning medications can be taken with sips of water including aspirin 81 mg.  Confirmed patient has responsible adult to drive home post procedure and be with patient first 24 hours after arriving home.  Plan to go home the same day, you will only stay overnight if medically necessary.  Reviewed procedure instructions with patient. Confirmed with patient dialysis schedule is MWF.

## 2022-11-29 ENCOUNTER — Ambulatory Visit (HOSPITAL_COMMUNITY)
Admission: RE | Admit: 2022-11-29 | Discharge: 2022-11-29 | Disposition: A | Discharge status: Home / Self Care | Payer: Medicare Other | Source: Ambulatory Visit | Attending: Internal Medicine | Admitting: Internal Medicine

## 2022-11-29 ENCOUNTER — Encounter (HOSPITAL_COMMUNITY)
Admission: RE | Disposition: A | Discharge status: Home / Self Care | Payer: Medicare Other | Source: Ambulatory Visit | Attending: Internal Medicine

## 2022-11-29 ENCOUNTER — Other Ambulatory Visit: Payer: Self-pay

## 2022-11-29 DIAGNOSIS — Z85048 Personal history of other malignant neoplasm of rectum, rectosigmoid junction, and anus: Secondary | ICD-10-CM | POA: Diagnosis not present

## 2022-11-29 DIAGNOSIS — Z992 Dependence on renal dialysis: Secondary | ICD-10-CM | POA: Diagnosis not present

## 2022-11-29 DIAGNOSIS — N186 End stage renal disease: Secondary | ICD-10-CM | POA: Insufficient documentation

## 2022-11-29 DIAGNOSIS — Z6834 Body mass index (BMI) 34.0-34.9, adult: Secondary | ICD-10-CM | POA: Diagnosis not present

## 2022-11-29 DIAGNOSIS — E669 Obesity, unspecified: Secondary | ICD-10-CM | POA: Insufficient documentation

## 2022-11-29 DIAGNOSIS — Z8249 Family history of ischemic heart disease and other diseases of the circulatory system: Secondary | ICD-10-CM | POA: Diagnosis not present

## 2022-11-29 DIAGNOSIS — I209 Angina pectoris, unspecified: Secondary | ICD-10-CM | POA: Insufficient documentation

## 2022-11-29 DIAGNOSIS — I120 Hypertensive chronic kidney disease with stage 5 chronic kidney disease or end stage renal disease: Secondary | ICD-10-CM | POA: Insufficient documentation

## 2022-11-29 DIAGNOSIS — I10 Essential (primary) hypertension: Secondary | ICD-10-CM

## 2022-11-29 HISTORY — PX: LEFT HEART CATH AND CORONARY ANGIOGRAPHY: CATH118249

## 2022-11-29 HISTORY — PX: INTRAVASCULAR PRESSURE WIRE/FFR STUDY: CATH118243

## 2022-11-29 HISTORY — PX: CORONARY PRESSURE/FFR STUDY: CATH118243

## 2022-11-29 LAB — POCT ACTIVATED CLOTTING TIME
Activated Clotting Time: 261 seconds
Activated Clotting Time: 185 seconds
Activated Clotting Time: 174 seconds

## 2022-11-29 SURGERY — LEFT HEART CATH AND CORONARY ANGIOGRAPHY
Anesthesia: LOCAL

## 2022-11-29 MED ORDER — HEPARIN SODIUM (PORCINE) 1000 UNIT/ML IJ SOLN
INTRAMUSCULAR | Status: DC | PRN
  Administered 2022-11-29: 5000 [IU] via INTRAVENOUS

## 2022-11-29 MED ORDER — LIDOCAINE HCL (PF) 1 % IJ SOLN
INTRAMUSCULAR | Status: DC | PRN
  Administered 2022-11-29: 20 mL via INTRADERMAL

## 2022-11-29 MED ORDER — HEPARIN (PORCINE) IN NACL 1000-0.9 UT/500ML-% IV SOLN
INTRAVENOUS | Status: DC | PRN
  Administered 2022-11-29: 1000 mL

## 2022-11-29 MED ORDER — HYDRALAZINE HCL 20 MG/ML IJ SOLN: 10.0000 mg | INTRAMUSCULAR | Status: DC | PRN

## 2022-11-29 MED ORDER — ADENOSINE 12 MG/4ML IV SOLN
INTRAVENOUS | Status: AC
  Filled 2022-11-29: qty 16

## 2022-11-29 MED ORDER — SODIUM CHLORIDE 0.9 % IV SOLN
INTRAVENOUS | Status: DC | PRN
  Administered 2022-11-29: 10 mL/h via INTRAVENOUS

## 2022-11-29 MED ORDER — SODIUM CHLORIDE 0.9 % IV SOLN: INTRAVENOUS | Status: DC

## 2022-11-29 MED ORDER — MIDAZOLAM HCL 2 MG/2ML IJ SOLN
INTRAMUSCULAR | Status: DC | PRN
  Administered 2022-11-29 (×3): 1 mg via INTRAVENOUS

## 2022-11-29 MED ORDER — SODIUM CHLORIDE 0.9% FLUSH: 3.0000 mL | Freq: Two times a day (BID) | INTRAVENOUS | Status: DC

## 2022-11-29 MED ORDER — ONDANSETRON HCL 4 MG/2ML IJ SOLN: 4.0000 mg | Freq: Four times a day (QID) | INTRAMUSCULAR | Status: DC | PRN

## 2022-11-29 MED ORDER — ADENOSINE (DIAGNOSTIC) 140MCG/KG/MIN
INTRAVENOUS | Status: DC | PRN
  Administered 2022-11-29: 140 ug/kg/min via INTRAVENOUS

## 2022-11-29 MED ORDER — ASPIRIN 81 MG PO CHEW: 81.0000 mg | CHEWABLE_TABLET | ORAL | Status: DC

## 2022-11-29 MED ORDER — SODIUM CHLORIDE 0.9 % IV SOLN: 250.0000 mL | INTRAVENOUS | Status: DC | PRN

## 2022-11-29 MED ORDER — HEPARIN SODIUM (PORCINE) 1000 UNIT/ML IJ SOLN
INTRAMUSCULAR | Status: AC
  Filled 2022-11-29: qty 10

## 2022-11-29 MED ORDER — LIDOCAINE HCL (PF) 1 % IJ SOLN
INTRAMUSCULAR | Status: AC
  Filled 2022-11-29: qty 30

## 2022-11-29 MED ORDER — FENTANYL CITRATE (PF) 100 MCG/2ML IJ SOLN
INTRAMUSCULAR | Status: AC
  Filled 2022-11-29: qty 2

## 2022-11-29 MED ORDER — MIDAZOLAM HCL 2 MG/2ML IJ SOLN
INTRAMUSCULAR | Status: AC
  Filled 2022-11-29: qty 2

## 2022-11-29 MED ORDER — FENTANYL CITRATE (PF) 100 MCG/2ML IJ SOLN
INTRAMUSCULAR | Status: DC | PRN
  Administered 2022-11-29 (×3): 25 ug via INTRAVENOUS

## 2022-11-29 MED ORDER — SODIUM CHLORIDE 0.9% FLUSH: 3.0000 mL | INTRAVENOUS | Status: DC | PRN

## 2022-11-29 MED ORDER — IOHEXOL 350 MG/ML SOLN
INTRAVENOUS | Status: DC | PRN
  Administered 2022-11-29: 70 mL

## 2022-11-29 MED ORDER — ACETAMINOPHEN 325 MG PO TABS: 650.0000 mg | ORAL_TABLET | ORAL | Status: DC | PRN

## 2022-11-29 MED ORDER — LABETALOL HCL 5 MG/ML IV SOLN: 10.0000 mg | INTRAVENOUS | Status: DC | PRN

## 2022-11-29 SURGICAL SUPPLY — 13 items
CATH INFINITI 5FR MULTPACK ANG (CATHETERS) IMPLANT
CATH LAUNCHER 5F EBU3.5 (CATHETERS) IMPLANT
GUIDEWIRE PRESSURE X 175 (WIRE) IMPLANT
KIT ESSENTIALS PG (KITS) IMPLANT
KIT HEART LEFT (KITS) ×1 IMPLANT
PACK CARDIAC CATHETERIZATION (CUSTOM PROCEDURE TRAY) ×1 IMPLANT
SHEATH PINNACLE 5F 10CM (SHEATH) IMPLANT
SHEATH PROBE COVER 6X72 (BAG) IMPLANT
SYR MEDRAD MARK 7 150ML (SYRINGE) ×1 IMPLANT
TRANSDUCER W/STOPCOCK (MISCELLANEOUS) ×1 IMPLANT
TUBING CIL FLEX 10 FLL-RA (TUBING) ×1 IMPLANT
WIRE EMERALD 3MM-J .035X150CM (WIRE) IMPLANT
WIRE MICRO SET SILHO 5FR 7 (SHEATH) IMPLANT

## 2022-11-29 NOTE — Progress Notes (Signed)
Site Area: right groin   Site prior to removal: level 0  Pressure applied for: 20 minutes   Bedrest beginning at: 1615   Manual: yes   Patient status during pull: stable   Post pull groin site: level 0   Post pull instructions given: yes   Post pull pulses present: +1 pedal on right foot and +1 pedal on left foot   Dressing applied: gauze and tegaderm   Comments:

## 2022-11-29 NOTE — Interval H&P Note (Signed)
History and Physical Interval Note:  11/29/2022 10:14 AM  [place]  has presented today for surgery, with the diagnosis of ANGINA.  The various methods of treatment have been discussed with the patient and family. After consideration of risks, benefits and other options for treatment, the patient has consented to  Procedure(s): LEFT HEART CATH AND CORONARY ANGIOGRAPHY (N/A) as a surgical intervention.  The patient's history has been reviewed, patient examined, no change in status, stable for surgery.  I have reviewed the patient's chart and labs.  Questions were answered to the patient's satisfaction.    Cath Lab Visit (complete for each Cath Lab visit)  Clinical Evaluation Leading to the Procedure:   ACS: No.  Non-ACS:    Anginal Classification: CCS II  Anti-ischemic medical therapy: Maximal Therapy (2 or more classes of medications)  Non-Invasive Test Results: No non-invasive testing performed  Prior CABG: No previous CABG        Early [place]

## 2022-11-29 NOTE — Progress Notes (Signed)
Pt ambulated to and from bathroom to void with no oozing noted from right groin site

## 2022-11-30 ENCOUNTER — Encounter (HOSPITAL_COMMUNITY): Payer: Self-pay | Admitting: Internal Medicine

## 2022-12-04 ENCOUNTER — Telehealth: Payer: Self-pay

## 2022-12-04 DIAGNOSIS — N186 End stage renal disease: Secondary | ICD-10-CM

## 2022-12-04 NOTE — Telephone Encounter (Signed)
Caller: Pt's mother, Andris Flurry  Concern: with symptoms of pain, numbness, cold hand/fingertips  Location: R hand, worse than L  Description: gradual  Aggravating Factors: HD treatments, sometimes ends early  Quality: aching and throbbing  Treatments: acetaminophen  Procedure: Dialysis Access Surgery R AVF creation on 05/17/22, superficialization on 07/17/22  Resolution: Appointment scheduled for steal [place] & [place]  Next Appt: Appointment scheduled for 12/06/22 @ 1200

## 2022-12-06 ENCOUNTER — Ambulatory Visit (HOSPITAL_COMMUNITY)
Admission: RE | Admit: 2022-12-06 | Discharge: 2022-12-06 | Disposition: A | Discharge status: Home / Self Care | Payer: Medicare Other | Source: Ambulatory Visit | Attending: Vascular Surgery | Admitting: Vascular Surgery

## 2022-12-06 ENCOUNTER — Ambulatory Visit (INDEPENDENT_AMBULATORY_CARE_PROVIDER_SITE_OTHER): Payer: Medicare Other | Admitting: Physician Assistant

## 2022-12-06 VITALS — BP 99/61 | HR 98 | Temp 98.3°F | Resp 16 | Ht 59.0 in | Wt 168.1 lb

## 2022-12-06 DIAGNOSIS — M79642 Pain in left hand: Secondary | ICD-10-CM | POA: Diagnosis not present

## 2022-12-06 DIAGNOSIS — N186 End stage renal disease: Secondary | ICD-10-CM | POA: Diagnosis not present

## 2022-12-06 DIAGNOSIS — R202 Paresthesia of skin: Secondary | ICD-10-CM | POA: Diagnosis not present

## 2022-12-06 DIAGNOSIS — Z992 Dependence on renal dialysis: Secondary | ICD-10-CM | POA: Diagnosis not present

## 2022-12-06 DIAGNOSIS — M79641 Pain in right hand: Secondary | ICD-10-CM

## 2022-12-06 DIAGNOSIS — R200 Anesthesia of skin: Secondary | ICD-10-CM

## 2022-12-06 NOTE — Progress Notes (Signed)
Office Note   History of Present Illness   Ariel [name] is a 47 y.o. (02/14/1976) female who presents as a triage visit.  She has a history of right arm brachiocephalic AV fistula creation on 05/17/2022 with superficialization on 07/17/2022 by Dr. Karin Lieuobins.  Postoperatively she did not have any steal symptoms and was cleared to use her fistula by mid December.  She presents today as a triage visit.  She states over the last 2 weeks she has been having right hand pain, numbness, and swelling.  The pain feels like a throbbing/burning pain, which worsens during dialysis and worsens with touch.  Typically this pain feels the worst around the knuckles of her 3rd-5th fingers. Her right hand is not constantly numb but typically goes numb during dialysis, while sitting down or while laying down at night.  The swelling was all over her right hand and has no known alleviating/aggravating factors.  This morning during her visit she states that when she woke up today, the swelling in her right hand was gone.  Over the last few days she has been developing similar symptoms in the left hand, however not as severe as the right hand.  She endorses difficulty with grip strength in the right hand when it was very swollen, however this is improved today.  She denies any erythema or trauma to the hands.  She denies any excessive coldness of either hand, but her fingertips feel "a little cold" bilaterally.   She has been dialyzing via right arm brachiocephalic fistula since December without issue.   She does endorse a history of severe cervical spine pain with no prior interventions.  Current Outpatient Medications  Medication Sig Dispense Refill   acetaminophen (TYLENOL) 500 MG tablet Take 500 mg by mouth every 6 (six) hours as needed for headache (pain).     allopurinol (ZYLOPRIM) 100 MG tablet Take 100 mg by mouth daily.     ALPRAZolam (XANAX) 0.5 MG tablet Take 0.5 mg by mouth at bedtime.   1    aspirin-acetaminophen-caffeine (EXCEDRIN MIGRAINE) 250-250-65 MG tablet Take 1 tablet by mouth every 6 (six) hours as needed for headache. 30 tablet 0   B Complex-C-Folic Acid (RENA-VITE RX) 1 MG TABS Take 1 tablet by mouth daily.     furosemide (LASIX) 40 MG tablet Take 40 mg by mouth daily as needed for fluid or edema.      gabapentin (NEURONTIN) 300 MG capsule Take 300 mg by mouth at bedtime.     hydrOXYzine (ATARAX) 25 MG tablet Take 25-50 mg by mouth 2 (two) times daily as needed for dizziness or anxiety.     isosorbide mononitrate (IMDUR) 30 MG 24 hr tablet Take 1 tablet (30 mg total) by mouth every morning. (Patient not taking: Reported on 11/26/2022) 90 tablet 3   magnesium oxide (MAG-OX) 400 (240 Mg) MG tablet Take 400 mg by mouth daily.     medroxyPROGESTERone (PROVERA) 10 MG tablet Take 10 mg by mouth daily.  3   metoprolol succinate (TOPROL XL) 25 MG 24 hr tablet Take 0.5 tablets (12.5 mg total) by mouth daily. (Patient not taking: Reported on 11/26/2022) 45 tablet 3   mycophenolate (MYFORTIC) 180 MG EC tablet Take 180 mg by mouth 2 (two) times daily.     nitroGLYCERIN (NITROSTAT) 0.4 MG SL tablet Place 1 tablet (0.4 mg total) under the tongue every 5 (five) minutes as needed for chest pain. 25 tablet 6   ondansetron (ZOFRAN-ODT) 4 MG disintegrating  tablet Take 4 mg by mouth every 8 (eight) hours as needed for nausea/vomiting.     pantoprazole (PROTONIX) 40 MG tablet Take 1 tablet (40 mg total) by mouth daily. 90 tablet 4   predniSONE (DELTASONE) 1 MG tablet Take 1 mg by mouth daily with breakfast.     sertraline (ZOLOFT) 50 MG tablet Take 50 mg by mouth daily.     sevelamer carbonate (RENVELA) 800 MG tablet Take 800 mg by mouth 3 (three) times daily with meals.     simvastatin (ZOCOR) 5 MG tablet Take 5 mg by mouth at bedtime.     tacrolimus (PROGRAF) 0.5 MG capsule Take 0.5 mg by mouth daily.     traZODone (DESYREL) 100 MG tablet Take 100 mg by mouth at bedtime.     No current  facility-administered medications for this visit.    REVIEW OF SYSTEMS (negative unless checked):   Cardiac:  []  Chest pain or chest pressure? []  Shortness of breath upon activity? []  Shortness of breath when lying flat? []  Irregular heart rhythm?  Vascular:  []  Pain in calf, thigh, or hip brought on by walking? []  Pain in feet at night that wakes you up from your sleep? []  Blood clot in your veins? []  Leg swelling?  Pulmonary:  []  Oxygen at home? []  Productive cough? []  Wheezing?  Neurologic:  []  Sudden weakness in arms or legs? []  Sudden numbness in arms or legs? []  Sudden onset of difficult speaking or slurred speech? []  Temporary loss of vision in one eye? []  Problems with dizziness?  Gastrointestinal:  []  Blood in stool? []  Vomited blood?  Genitourinary:  []  Burning when urinating? []  Blood in urine?  Psychiatric:  []  Major depression  Hematologic:  []  Bleeding problems? []  Problems with blood clotting?  Dermatologic:  []  Rashes or ulcers?  Constitutional:  []  Fever or chills?  Ear/Nose/Throat:  []  Change in hearing? []  Nose bleeds? []  Sore throat?  Musculoskeletal:  []  Back pain? []  Joint pain? []  Muscle pain?   Physical Examination   Vitals:   12/06/22 1220  BP: 99/61  Pulse: 98  Resp: 16  Temp: 98.3 F (36.8 C)  TempSrc: Temporal  SpO2: 98%  Weight: 168 lb 1.6 oz (76.2 kg)  Height: 4\' 11"  (1.499 m)   Body mass index is 33.95 kg/m.  General:  WDWN in NAD; vital signs documented above Gait: Not observed HENT: WNL, normocephalic Pulmonary: normal non-labored breathing , without Rales, rhonchi,  wheezing Cardiac: regular Abdomen: soft, NT, no masses Skin: without rashes Vascular Exam/Pulses: 1+ bilateral radial pulses. Brisk multiphasic right radial, ulnar, and palmar arch signals Extremities: No ischemic changes to the right hand. Trace edema of the right hand and sensitive to touch. Right upper arm fistula with palpable  thrill Musculoskeletal: no muscle wasting or atrophy  Neurologic: A&O X 3;  grip strength 4/5 in bilateral upper extremities Psychiatric:  The pt has Normal affect.   Non-invasive Vascular Imaging   Right Arm Steal Study  (12/06/2022):  The radial artery is non-compressible.  The 2nd digit ambient pressure is 81 mmHg.  The 2nd digit pressure with compression is 96 mmHg.     Medical Decision Making   Ariel [name] is a 47 y.o. female who presents with bilateral hand pain and numbness  The patient has a history of right brachiocephalic fistula creation in 2023 with superficialization in November 2023. She had no steal symptoms postoperatively She describes a 2-3 week history of right hand cramping/stinging, swelling, and  numbness. The pain worsens during dialysis and with touch. The numbness is only noticed during dialysis, while sitting, or when waking up in the morning. On exam today there is no more swelling. She has also developed similar symptoms in the left hand for the last few days, but not as severe as the right hand On exam her right hand has trace edema. There is no erythema to either of her hands. Her hands are warm without tissue loss. She has 1+ radial pulses bilaterally and multiphasic radial, ulnar, and palmar signals on the right. Her grip strength in the hands is 4/5 bilaterally.  Given that the patient has brisk doppler signals on the right, warm temperature of the hand, and a similar phenomenon in the left hand, I am not convinced that she has steal syndrome. Since her pain is bilateral and accompanied by numbness, this may be neurological in nature. Potentially issues with the cervical spine, given that she has a history of severe neck pain. For the time being I am referring her to neurology. She can continue to use her right arm fistula for dialysis. She can follow up with our clinic as needed and/or if her symptoms in the right hand worsen.   Ariel DubonnetMcKenzi Graziella Connery  PA-C [facility] Office: [phone]  Clinic MD: Edilia Boickson

## 2022-12-12 DIAGNOSIS — D013 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: Secondary | ICD-10-CM | POA: Diagnosis not present

## 2023-01-01 ENCOUNTER — Ambulatory Visit (INDEPENDENT_AMBULATORY_CARE_PROVIDER_SITE_OTHER): Payer: Medicare Other | Admitting: Diagnostic Neuroimaging

## 2023-01-01 ENCOUNTER — Encounter: Payer: Self-pay | Admitting: Diagnostic Neuroimaging

## 2023-01-01 VITALS — BP 129/71 | HR 96 | Ht 59.0 in | Wt 168.4 lb

## 2023-01-01 DIAGNOSIS — R799 Abnormal finding of blood chemistry, unspecified: Secondary | ICD-10-CM | POA: Diagnosis not present

## 2023-01-01 DIAGNOSIS — R200 Anesthesia of skin: Secondary | ICD-10-CM

## 2023-01-01 DIAGNOSIS — R202 Paresthesia of skin: Secondary | ICD-10-CM | POA: Diagnosis not present

## 2023-01-01 NOTE — Patient Instructions (Signed)
  NUMBNESS / PAIN / SWELLING IN FINGERS (~March 2024) - neuro exam unremarkable; could be related to underlying swelling / fluid retention causing nerve irritation; underlying neuropathy possible (could be related to ESRD) - check neuropathy labs - continue gabapentin

## 2023-01-01 NOTE — Progress Notes (Signed)
[facility]  PATIENT: Ariel [name] DOB: 13-Dec-1975  REFERRING CLINICIAN: Schuh, McKenzi P, PA-C HISTORY FROM: patient  REASON FOR VISIT: new consult   HISTORICAL  CHIEF COMPLAINT:  Chief Complaint  Patient presents with   New Patient (Initial Visit)    Patient in room #7 with her mother. Patient states she here today bilateral hand pain, numbness and swelling.    HISTORY OF PRESENT ILLNESS:   47 year old female with bilateral hand pain and numbness since March 2024.  Patient has history of kidney transplant x 2, with recent return to hemodialysis in December 2023.  In March 2024 she started noticing swelling in her hands and fingers.  This was associated with pain, numbness and tingling in the fingers.  All fingers affected, mainly digits 1 through 4.  Also noticed some tingling sensation in her toes on the left side.    REVIEW OF SYSTEMS: Full 14 system review of systems performed and negative with exception of: as per HPI  ALLERGIES: Allergies  Allergen Reactions   Bactrim [Sulfamethoxazole-Trimethoprim] Anaphylaxis   Sulfamethoxazole Anaphylaxis   Ambien [Zolpidem]     Sleep walking, hallucinations   Hydrogen Peroxide Rash   Labetalol Hives and Rash   Wound Dressing Adhesive Hives   Amlodipine Swelling   Hyoscyamine Other (See Comments)    Dizziness    Keflex [Cephalexin] Hives and Swelling    Per pt, her face and lips swell up and she develops hives   Orphenadrine Hives   Adhesive [Tape] Rash    Can not use for long periods of time (3 days or more)   Imuran [Azathioprine Sodium] Rash   Neosporin [Bacitracin-Polymyxin B] Rash and Other (See Comments)    Can use that for awhile, but will eventually develop a rash    Tramadol Rash   Warfarin Sodium Rash    HOME MEDICATIONS: Outpatient Medications Prior to Visit  Medication Sig Dispense Refill   acetaminophen (TYLENOL) 500 MG tablet Take 500 mg by mouth every 6 (six) hours as  needed for headache (pain).     allopurinol (ZYLOPRIM) 100 MG tablet Take 100 mg by mouth daily.     ALPRAZolam (XANAX) 0.5 MG tablet Take 0.5 mg by mouth at bedtime.   1   aspirin-acetaminophen-caffeine (EXCEDRIN MIGRAINE) 250-250-65 MG tablet Take 1 tablet by mouth every 6 (six) hours as needed for headache. 30 tablet 0   B Complex-C-Folic Acid (RENA-VITE RX) 1 MG TABS Take 1 tablet by mouth daily.     furosemide (LASIX) 40 MG tablet Take 40 mg by mouth daily as needed for fluid or edema.      gabapentin (NEURONTIN) 300 MG capsule Take 300 mg by mouth at bedtime.     hydrOXYzine (ATARAX) 25 MG tablet Take 25-50 mg by mouth 2 (two) times daily as needed for dizziness or anxiety.     magnesium oxide (MAG-OX) 400 (240 Mg) MG tablet Take 400 mg by mouth daily.     medroxyPROGESTERone (PROVERA) 10 MG tablet Take 10 mg by mouth daily.  3   mycophenolate (MYFORTIC) 180 MG EC tablet Take 180 mg by mouth 2 (two) times daily.     nitroGLYCERIN (NITROSTAT) 0.4 MG SL tablet Place 1 tablet (0.4 mg total) under the tongue every 5 (five) minutes as needed for chest pain. 25 tablet 6   ondansetron (ZOFRAN-ODT) 4 MG disintegrating tablet Take 4 mg by mouth every 8 (eight) hours as needed for nausea/vomiting.     pantoprazole (  PROTONIX) 40 MG tablet Take 1 tablet (40 mg total) by mouth daily. 90 tablet 4   predniSONE (DELTASONE) 1 MG tablet Take 1 mg by mouth daily with breakfast.     sertraline (ZOLOFT) 50 MG tablet Take 50 mg by mouth daily.     sevelamer carbonate (RENVELA) 800 MG tablet Take 800 mg by mouth 3 (three) times daily with meals.     simvastatin (ZOCOR) 5 MG tablet Take 5 mg by mouth at bedtime.     tacrolimus (PROGRAF) 0.5 MG capsule Take 0.5 mg by mouth daily.     traZODone (DESYREL) 100 MG tablet Take 100 mg by mouth at bedtime.     isosorbide mononitrate (IMDUR) 30 MG 24 hr tablet Take 1 tablet (30 mg total) by mouth every morning. (Patient not taking: Reported on 11/26/2022) 90 tablet 3    metoprolol succinate (TOPROL XL) 25 MG 24 hr tablet Take 0.5 tablets (12.5 mg total) by mouth daily. (Patient not taking: Reported on 11/26/2022) 45 tablet 3   No facility-administered medications prior to visit.    PAST MEDICAL HISTORY: Past Medical History:  Diagnosis Date   Abdominal distension (gaseous) 04/16/2018   Abdominal pain 02/14/2022   Abnormal CT scan, gastrointestinal tract    AIN grade II    AIN grade III 04/05/2020   AKI (acute kidney injury) (HCC) 08/18/2021   Allergy, unspecified, initial encounter 08/28/2022   Anaphylactic shock, unspecified, initial encounter 08/28/2022   Anemia    Anemia associated with chronic renal failure    Anemia in chronic kidney disease 08/21/2022   Anorectal disorder 06/08/2020   Anorectal pain 06/08/2020   Anxiety disorder 10/29/2018   Back pain    Bloating 04/16/2018   Cancer (HCC)    pre cervical   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    of the [place]   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 09/05/2022   Carcinoma in situ of vulva 09/05/2022   Chest pain of uncertain etiology 05/09/2022   Chronic kidney disease, stage V (HCC) 11/03/2021   Complications due to cardiac device, implant, and graft 09/02/2012   Condyloma of female genitalia    Dehydration 01/19/2019   Dependence on renal dialysis (HCC) 08/21/2022   Depression    Dialysis patient (HCC)    not currently as of 07/02/22   Drug-induced bleeding disorder (HCC) 01/19/2019   DVT of axillary vein, acute left (HCC) 08/02/2012   Dysmenorrhea 10/19/2015   Dyspnea on exertion 01/19/2019   Dysuria    End stage renal disease (HCC) 09/02/2012   End stage renal disease on dialysis (HCC) 11/05/2022   ESRD (end stage renal disease) (HCC)    sp transplant x 2, did mix of HD and PD from 2001- 2008   Essential hypertension 11/03/2021   Essential hypertension with goal blood pressure less than 130/80    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   Fatigue     Gastritis, acute    GERD without esophagitis 12/20/2021   Gout    Gout due to renal impairment, unspecified site 08/21/2022   History of parathyroidectomy 10/29/2018   History of renal transplant    HTN (hypertension) 01/19/2019   Hypercholesterolemia    Hyperlipidemia    Hyperphosphatemia 12/20/2021   Hypertensive chronic kidney disease with stage 1 through stage 4 chronic kidney disease, or unspecified chronic kidney disease 08/21/2022   Hypokalemia    Hypomagnesemia    Hypothyroid 01/19/2019   IBS (irritable bowel syndrome)  Immunosuppressive management encounter following kidney transplant 10/29/2018   Internal hemorrhoid 04/22/2018   Iron deficiency anemia 10/19/2015   Irregular uterine bleeding 02/13/2016   Kidney disorder 06/08/2020   Kidney transplant recipient 10/29/2018   1st transplant [facility]) was 1995- 2001.  Did HD/ PD mix from 2001- 2008. 2nd transplant ([facility]/ Claris Gower) was in 2008 and is still working as of 12/2021. F/b Dr Signe Colt ([place]) and [facility] transplant team.   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   Nausea and vomiting 01/19/2019   Nausea and vomiting 01/19/2019   Nephrogenic diabetes insipidus (HCC)    congenital   Obesity, Class I, BMI 30-34.9 12/21/2021   Other chronic sinusitis 10/29/2018   Pain, unspecified 08/28/2022   Palpitations    Pancreatic duct dilated 02/15/2022   Pancreatitis    Perianal lesion 04/22/2018   Phlebitis and thrombophlebitis of other sites 08/02/2012   Plantar fasciitis 07/03/2018   Pre-transplant evaluation for kidney  transplant 01/09/2022   Preop cardiovascular exam 05/09/2022   Pruritus ani 06/26/2018   Renal failure, chronic 11/03/2021   Dialysis in the past   Right lower quadrant abdominal pain 10/29/2018   [facility] spotted fever    S/p cadaver renal transplant 08/02/2012   Secondary hyperparathyroidism of renal origin [facility])    Secondary hypertension due to renal disease 10/29/2018   Sesamoiditis 07/03/2018   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    Transplanted organ removal status 10/29/2018   Unstable angina (HCC) 01/19/2019   Urinary tract infection    Vision loss 06/15/2022   Vulvar dystrophy    Yeast dermatitis 08/14/2018    PAST SURGICAL HISTORY: Past Surgical History:  Procedure Laterality Date   AV FISTULA PLACEMENT     AV FISTULA PLACEMENT Right 05/17/2022   Procedure: RIGHT ARM BRACHIOCEPHALIC ARTERIOVENOUS (AV) FISTULA CREATION;  Surgeon: Victorino Sparrow, MD;  Location[facility] OR;  Service: Vascular;  Laterality: Right;   COLONOSCOPY  11/28/2005   [facility]   COLONOSCOPY  04/13/2016   Dr Rayfield Citizen   CORONARY PRESSURE/FFR STUDY N/A 11/29/2022   Procedure: INTRAVASCULAR PRESSURE WIRE/FFR STUDY;  Surgeon: Orbie Pyo, MD;  Location: [facility] INVASIVE CV LAB;  Service: Cardiovascular;  Laterality: N/A;   ESOPHAGOGASTRODUODENOSCOPY  07/31/2017   Distal esophageal ulcers (likely due to gastroeesphageal reflix-biopsied). Small hiatal hernia. Erosive gastritis.   FISTULA SUPERFICIALIZATION Right 07/17/2022   Procedure: SUPERFICIALIZATION AND REVISION RIGHT ARM FISTULA;  Surgeon: Victorino Sparrow, MD;  Location: [facility] OR;  Service: Vascular;  Laterality: Right;   HEMORRHOID SURGERY     2019, and 04/29/2020   INSERTION OF DIALYSIS CATHETER     KIDNEY TRANSPLANT  1995, 2008   KNEE SURGERY Left 2020   LEFT HEART CATH AND CORONARY ANGIOGRAPHY N/A 11/29/2022   Procedure: LEFT HEART CATH AND CORONARY ANGIOGRAPHY;  Surgeon: Orbie Pyo, MD;  Location: [facility] INVASIVE CV LAB;   Service: Cardiovascular;  Laterality: N/A;   PARATHYROIDECTOMY  2004   Portion on the parathyroid gland transplanted in R forearm   REMOVAL OF A DIALYSIS CATHETER     SIMPLE VULVECTOMY  06/19/2011  Partial simple left   SKIN CANCER EXCISION  2016   Per pt, area removed on scalp showed squamous cell carcinoma   TUBAL LIGATION     UPPER GASTROINTESTINAL ENDOSCOPY     WISDOM TOOTH EXTRACTION      FAMILY HISTORY: Family History  Problem Relation Age of Onset   Hypertension Mother    Hyperlipidemia Mother    Diabetes Father    Rectal cancer Maternal Grandmother    Colon cancer Maternal Grandmother    Esophageal cancer Neg Hx    Stomach cancer Neg Hx     SOCIAL HISTORY: Social History   Socioeconomic History   Marital status: Divorced    Spouse name: Not on file   Number of children: Not on file   Years of education: Not on file   Highest education level: Not on file  Occupational History   Not on file  Tobacco Use   Smoking status: Never    Passive exposure: Never   Smokeless tobacco: Never  Vaping Use   Vaping Use: Never used  Substance and Sexual Activity   Alcohol use: No   Drug use: No   Sexual activity: Not on file  Other Topics Concern   Not on file  Social History Narrative   Not on file   Social Determinants of Health   Financial Resource Strain: Not on file  Food Insecurity: Not on file  Transportation Needs: Not on file  Physical Activity: Not on file  Stress: Not on file  Social Connections: Not on file  Intimate Partner Violence: Not on file     PHYSICAL EXAM  GENERAL EXAM/CONSTITUTIONAL: Vitals:  Vitals:   01/01/23 1341  BP: 129/71  Pulse: 96  Weight: 168 lb 6.4 oz (76.4 kg)  Height: 4\' 11"  (1.499 m)   Body mass index is 34.01 kg/m. Wt Readings from Last 3 Encounters:  01/01/23 168 lb 6.4 oz (76.4 kg)  12/06/22 168 lb 1.6 oz (76.2 kg)  11/29/22 170 lb (77.1 kg)   Patient is in no distress; well developed, nourished and groomed;  neck is supple  CARDIOVASCULAR: Examination of carotid arteries is normal; no carotid bruits Regular rate and rhythm, no murmurs Examination of peripheral vascular system by observation and palpation is normal  EYES: Ophthalmoscopic exam of optic discs and posterior segments is normal; no papilledema or hemorrhages No results found.  MUSCULOSKELETAL: Gait, strength, tone, movements noted in Neurologic exam below  NEUROLOGIC: MENTAL STATUS:      No data to display         awake, alert, oriented to person, place and time recent and remote memory intact normal attention and concentration language fluent, comprehension intact, naming intact fund of knowledge appropriate  CRANIAL NERVE:  2nd - no papilledema on fundoscopic exam 2nd, 3rd, 4th, 6th - pupils equal and reactive to light, visual fields full to confrontation, extraocular muscles intact, no nystagmus 5th - facial sensation symmetric 7th - facial strength symmetric 8th - hearing intact 9th - palate elevates symmetrically, uvula midline 11th - shoulder shrug symmetric 12th - tongue protrusion midline  MOTOR:  normal bulk and tone, full strength in the BUE, BLE  SENSORY:  normal and symmetric to light touch, pinprick, temperature, vibration  COORDINATION:  finger-nose-finger, fine finger movements normal  REFLEXES:  deep tendon reflexes TRACE and symmetric  GAIT/STATION:  narrow based gait     DIAGNOSTIC DATA (LABS, IMAGING, TESTING) - I reviewed patient records, labs, notes, testing and imaging myself where available.  Lab Results  Component Value Date   WBC 4.9 11/27/2022   HGB 8.8 (L) 11/27/2022   HCT 28.2 (L) 11/27/2022   MCV 94 11/27/2022   PLT 222 11/27/2022      Component Value Date/Time   NA 142 11/27/2022 1433   K 4.1 11/27/2022 1433   CL 102 11/27/2022 1433   CO2 20 11/27/2022 1433   GLUCOSE 128 (H) 11/27/2022 1433   GLUCOSE 85 11/19/2022 0929   BUN 12 11/27/2022 1433    CREATININE 5.22 (H) 11/27/2022 1433   CALCIUM 9.6 11/27/2022 1433   PROT 6.2 (L) 11/19/2022 0929   ALBUMIN 3.1 (L) 11/19/2022 0929   AST 25 11/19/2022 0929   ALT 16 11/19/2022 0929   ALKPHOS 72 11/19/2022 0929   BILITOT 0.5 11/19/2022 0929   GFRNONAA 6 (L) 11/19/2022 0929   GFRAA 23 (L) 03/10/2020 2052   No results found for: "CHOL", "HDL", "LDLCALC", "LDLDIRECT", "TRIG", "CHOLHDL" No results found for: "HGBA1C" No results found for: "VITAMINB12" No results found for: "TSH"     ASSESSMENT AND PLAN  47 y.o. year old female here with:  Dx:  1. Numbness and tingling   2. Abnormal finding of blood chemistry, unspecified     PLAN:  NUMBNESS / PAIN / SWELLING IN FINGERS (~ since March 2024) - neuro exam unremarkable; could be related to underlying swelling / fluid retention causing nerve irritation; underlying neuropathy possible (could be related to ESRD, now on HD) - check neuropathy labs - continue gabapentin  Orders Placed This Encounter  Procedures   Vitamin B12   Hemoglobin A1c   ANA,IFA RA Diag Pnl w/rflx Tit/Patn   Return for pending if symptoms worsen or fail to improve, pending test results.    Suanne Marker, MD 01/01/2023, 2:55 PM Certified in Neurology, Neurophysiology and Neuroimaging  [facility] [address] [place], [place] [address] [phone]

## 2023-01-02 DIAGNOSIS — N2581 Secondary hyperparathyroidism of renal origin: Secondary | ICD-10-CM | POA: Diagnosis not present

## 2023-01-02 DIAGNOSIS — Z23 Encounter for immunization: Secondary | ICD-10-CM | POA: Diagnosis not present

## 2023-01-02 DIAGNOSIS — T8610 Unspecified complication of kidney transplant: Secondary | ICD-10-CM | POA: Diagnosis not present

## 2023-01-02 DIAGNOSIS — D631 Anemia in chronic kidney disease: Secondary | ICD-10-CM | POA: Diagnosis not present

## 2023-01-02 DIAGNOSIS — E876 Hypokalemia: Secondary | ICD-10-CM | POA: Diagnosis not present

## 2023-01-02 DIAGNOSIS — Z992 Dependence on renal dialysis: Secondary | ICD-10-CM | POA: Diagnosis not present

## 2023-01-02 DIAGNOSIS — D508 Other iron deficiency anemias: Secondary | ICD-10-CM | POA: Diagnosis not present

## 2023-01-02 DIAGNOSIS — N186 End stage renal disease: Secondary | ICD-10-CM | POA: Diagnosis not present

## 2023-01-04 DIAGNOSIS — D631 Anemia in chronic kidney disease: Secondary | ICD-10-CM | POA: Diagnosis not present

## 2023-01-04 DIAGNOSIS — D508 Other iron deficiency anemias: Secondary | ICD-10-CM | POA: Diagnosis not present

## 2023-01-04 DIAGNOSIS — Z992 Dependence on renal dialysis: Secondary | ICD-10-CM | POA: Diagnosis not present

## 2023-01-04 DIAGNOSIS — N186 End stage renal disease: Secondary | ICD-10-CM | POA: Diagnosis not present

## 2023-01-04 DIAGNOSIS — Z23 Encounter for immunization: Secondary | ICD-10-CM | POA: Diagnosis not present

## 2023-01-04 DIAGNOSIS — E876 Hypokalemia: Secondary | ICD-10-CM | POA: Diagnosis not present

## 2023-01-04 DIAGNOSIS — N2581 Secondary hyperparathyroidism of renal origin: Secondary | ICD-10-CM | POA: Diagnosis not present

## 2023-01-04 LAB — HEMOGLOBIN A1C
Est. average glucose Bld gHb Est-mCnc: 103 mg/dL
Hgb A1c MFr Bld: 5.2 % (ref 4.8–5.6)

## 2023-01-04 LAB — ANA,IFA RA DIAG PNL W/RFLX TIT/PATN
ANA Titer 1: NEGATIVE
Cyclic Citrullin Peptide Ab: 6 units (ref 0–19)
Rheumatoid fact SerPl-aCnc: <10 IU/mL (ref <14.0)

## 2023-01-04 LAB — VITAMIN B12: Vitamin B-12: 513 pg/mL (ref 232–1245)

## 2023-01-07 DIAGNOSIS — Z23 Encounter for immunization: Secondary | ICD-10-CM | POA: Diagnosis not present

## 2023-01-07 DIAGNOSIS — N2581 Secondary hyperparathyroidism of renal origin: Secondary | ICD-10-CM | POA: Diagnosis not present

## 2023-01-07 DIAGNOSIS — D631 Anemia in chronic kidney disease: Secondary | ICD-10-CM | POA: Diagnosis not present

## 2023-01-07 DIAGNOSIS — D508 Other iron deficiency anemias: Secondary | ICD-10-CM | POA: Diagnosis not present

## 2023-01-07 DIAGNOSIS — E876 Hypokalemia: Secondary | ICD-10-CM | POA: Diagnosis not present

## 2023-01-07 DIAGNOSIS — N186 End stage renal disease: Secondary | ICD-10-CM | POA: Diagnosis not present

## 2023-01-07 DIAGNOSIS — Z992 Dependence on renal dialysis: Secondary | ICD-10-CM | POA: Diagnosis not present

## 2023-01-09 DIAGNOSIS — D508 Other iron deficiency anemias: Secondary | ICD-10-CM | POA: Diagnosis not present

## 2023-01-09 DIAGNOSIS — E876 Hypokalemia: Secondary | ICD-10-CM | POA: Diagnosis not present

## 2023-01-09 DIAGNOSIS — N2581 Secondary hyperparathyroidism of renal origin: Secondary | ICD-10-CM | POA: Diagnosis not present

## 2023-01-09 DIAGNOSIS — Z23 Encounter for immunization: Secondary | ICD-10-CM | POA: Diagnosis not present

## 2023-01-09 DIAGNOSIS — Z992 Dependence on renal dialysis: Secondary | ICD-10-CM | POA: Diagnosis not present

## 2023-01-09 DIAGNOSIS — D631 Anemia in chronic kidney disease: Secondary | ICD-10-CM | POA: Diagnosis not present

## 2023-01-09 DIAGNOSIS — N186 End stage renal disease: Secondary | ICD-10-CM | POA: Diagnosis not present

## 2023-01-11 DIAGNOSIS — Z23 Encounter for immunization: Secondary | ICD-10-CM | POA: Diagnosis not present

## 2023-01-11 DIAGNOSIS — Z992 Dependence on renal dialysis: Secondary | ICD-10-CM | POA: Diagnosis not present

## 2023-01-11 DIAGNOSIS — N186 End stage renal disease: Secondary | ICD-10-CM | POA: Diagnosis not present

## 2023-01-11 DIAGNOSIS — D508 Other iron deficiency anemias: Secondary | ICD-10-CM | POA: Diagnosis not present

## 2023-01-11 DIAGNOSIS — D631 Anemia in chronic kidney disease: Secondary | ICD-10-CM | POA: Diagnosis not present

## 2023-01-11 DIAGNOSIS — N2581 Secondary hyperparathyroidism of renal origin: Secondary | ICD-10-CM | POA: Diagnosis not present

## 2023-01-11 DIAGNOSIS — E876 Hypokalemia: Secondary | ICD-10-CM | POA: Diagnosis not present

## 2023-01-14 DIAGNOSIS — E876 Hypokalemia: Secondary | ICD-10-CM | POA: Diagnosis not present

## 2023-01-14 DIAGNOSIS — N186 End stage renal disease: Secondary | ICD-10-CM | POA: Diagnosis not present

## 2023-01-14 DIAGNOSIS — Z23 Encounter for immunization: Secondary | ICD-10-CM | POA: Diagnosis not present

## 2023-01-14 DIAGNOSIS — Z992 Dependence on renal dialysis: Secondary | ICD-10-CM | POA: Diagnosis not present

## 2023-01-14 DIAGNOSIS — D508 Other iron deficiency anemias: Secondary | ICD-10-CM | POA: Diagnosis not present

## 2023-01-14 DIAGNOSIS — D631 Anemia in chronic kidney disease: Secondary | ICD-10-CM | POA: Diagnosis not present

## 2023-01-14 DIAGNOSIS — N2581 Secondary hyperparathyroidism of renal origin: Secondary | ICD-10-CM | POA: Diagnosis not present

## 2023-01-16 DIAGNOSIS — N186 End stage renal disease: Secondary | ICD-10-CM | POA: Diagnosis not present

## 2023-01-16 DIAGNOSIS — D508 Other iron deficiency anemias: Secondary | ICD-10-CM | POA: Diagnosis not present

## 2023-01-16 DIAGNOSIS — Z23 Encounter for immunization: Secondary | ICD-10-CM | POA: Diagnosis not present

## 2023-01-16 DIAGNOSIS — E876 Hypokalemia: Secondary | ICD-10-CM | POA: Diagnosis not present

## 2023-01-16 DIAGNOSIS — Z992 Dependence on renal dialysis: Secondary | ICD-10-CM | POA: Diagnosis not present

## 2023-01-16 DIAGNOSIS — N2581 Secondary hyperparathyroidism of renal origin: Secondary | ICD-10-CM | POA: Diagnosis not present

## 2023-01-16 DIAGNOSIS — D631 Anemia in chronic kidney disease: Secondary | ICD-10-CM | POA: Diagnosis not present

## 2023-01-18 DIAGNOSIS — Z23 Encounter for immunization: Secondary | ICD-10-CM | POA: Diagnosis not present

## 2023-01-18 DIAGNOSIS — D631 Anemia in chronic kidney disease: Secondary | ICD-10-CM | POA: Diagnosis not present

## 2023-01-18 DIAGNOSIS — D508 Other iron deficiency anemias: Secondary | ICD-10-CM | POA: Diagnosis not present

## 2023-01-18 DIAGNOSIS — N186 End stage renal disease: Secondary | ICD-10-CM | POA: Diagnosis not present

## 2023-01-18 DIAGNOSIS — Z992 Dependence on renal dialysis: Secondary | ICD-10-CM | POA: Diagnosis not present

## 2023-01-18 DIAGNOSIS — E876 Hypokalemia: Secondary | ICD-10-CM | POA: Diagnosis not present

## 2023-01-18 DIAGNOSIS — N2581 Secondary hyperparathyroidism of renal origin: Secondary | ICD-10-CM | POA: Diagnosis not present

## 2023-01-21 DIAGNOSIS — Z992 Dependence on renal dialysis: Secondary | ICD-10-CM | POA: Diagnosis not present

## 2023-01-21 DIAGNOSIS — D631 Anemia in chronic kidney disease: Secondary | ICD-10-CM | POA: Diagnosis not present

## 2023-01-21 DIAGNOSIS — Z23 Encounter for immunization: Secondary | ICD-10-CM | POA: Diagnosis not present

## 2023-01-21 DIAGNOSIS — N186 End stage renal disease: Secondary | ICD-10-CM | POA: Diagnosis not present

## 2023-01-21 DIAGNOSIS — E876 Hypokalemia: Secondary | ICD-10-CM | POA: Diagnosis not present

## 2023-01-21 DIAGNOSIS — D508 Other iron deficiency anemias: Secondary | ICD-10-CM | POA: Diagnosis not present

## 2023-01-21 DIAGNOSIS — N2581 Secondary hyperparathyroidism of renal origin: Secondary | ICD-10-CM | POA: Diagnosis not present

## 2023-01-23 DIAGNOSIS — E876 Hypokalemia: Secondary | ICD-10-CM | POA: Diagnosis not present

## 2023-01-23 DIAGNOSIS — N2581 Secondary hyperparathyroidism of renal origin: Secondary | ICD-10-CM | POA: Diagnosis not present

## 2023-01-23 DIAGNOSIS — D631 Anemia in chronic kidney disease: Secondary | ICD-10-CM | POA: Diagnosis not present

## 2023-01-23 DIAGNOSIS — Z992 Dependence on renal dialysis: Secondary | ICD-10-CM | POA: Diagnosis not present

## 2023-01-23 DIAGNOSIS — Z23 Encounter for immunization: Secondary | ICD-10-CM | POA: Diagnosis not present

## 2023-01-23 DIAGNOSIS — N186 End stage renal disease: Secondary | ICD-10-CM | POA: Diagnosis not present

## 2023-01-23 DIAGNOSIS — D508 Other iron deficiency anemias: Secondary | ICD-10-CM | POA: Diagnosis not present

## 2023-01-25 DIAGNOSIS — D631 Anemia in chronic kidney disease: Secondary | ICD-10-CM | POA: Diagnosis not present

## 2023-01-25 DIAGNOSIS — D508 Other iron deficiency anemias: Secondary | ICD-10-CM | POA: Diagnosis not present

## 2023-01-25 DIAGNOSIS — Z23 Encounter for immunization: Secondary | ICD-10-CM | POA: Diagnosis not present

## 2023-01-25 DIAGNOSIS — N2581 Secondary hyperparathyroidism of renal origin: Secondary | ICD-10-CM | POA: Diagnosis not present

## 2023-01-25 DIAGNOSIS — N186 End stage renal disease: Secondary | ICD-10-CM | POA: Diagnosis not present

## 2023-01-25 DIAGNOSIS — E876 Hypokalemia: Secondary | ICD-10-CM | POA: Diagnosis not present

## 2023-01-25 DIAGNOSIS — Z992 Dependence on renal dialysis: Secondary | ICD-10-CM | POA: Diagnosis not present

## 2023-01-28 DIAGNOSIS — N2581 Secondary hyperparathyroidism of renal origin: Secondary | ICD-10-CM | POA: Diagnosis not present

## 2023-01-28 DIAGNOSIS — Z23 Encounter for immunization: Secondary | ICD-10-CM | POA: Diagnosis not present

## 2023-01-28 DIAGNOSIS — E876 Hypokalemia: Secondary | ICD-10-CM | POA: Diagnosis not present

## 2023-01-28 DIAGNOSIS — D508 Other iron deficiency anemias: Secondary | ICD-10-CM | POA: Diagnosis not present

## 2023-01-28 DIAGNOSIS — N186 End stage renal disease: Secondary | ICD-10-CM | POA: Diagnosis not present

## 2023-01-28 DIAGNOSIS — D631 Anemia in chronic kidney disease: Secondary | ICD-10-CM | POA: Diagnosis not present

## 2023-01-28 DIAGNOSIS — Z992 Dependence on renal dialysis: Secondary | ICD-10-CM | POA: Diagnosis not present

## 2023-01-30 DIAGNOSIS — N2581 Secondary hyperparathyroidism of renal origin: Secondary | ICD-10-CM | POA: Diagnosis not present

## 2023-01-30 DIAGNOSIS — D631 Anemia in chronic kidney disease: Secondary | ICD-10-CM | POA: Diagnosis not present

## 2023-01-30 DIAGNOSIS — N186 End stage renal disease: Secondary | ICD-10-CM | POA: Diagnosis not present

## 2023-01-30 DIAGNOSIS — Z23 Encounter for immunization: Secondary | ICD-10-CM | POA: Diagnosis not present

## 2023-01-30 DIAGNOSIS — E876 Hypokalemia: Secondary | ICD-10-CM | POA: Diagnosis not present

## 2023-01-30 DIAGNOSIS — D508 Other iron deficiency anemias: Secondary | ICD-10-CM | POA: Diagnosis not present

## 2023-01-30 DIAGNOSIS — Z992 Dependence on renal dialysis: Secondary | ICD-10-CM | POA: Diagnosis not present

## 2023-01-31 DIAGNOSIS — R000 Tachycardia, unspecified: Secondary | ICD-10-CM | POA: Diagnosis not present

## 2023-01-31 DIAGNOSIS — K828 Other specified diseases of gallbladder: Secondary | ICD-10-CM | POA: Diagnosis not present

## 2023-01-31 DIAGNOSIS — R112 Nausea with vomiting, unspecified: Secondary | ICD-10-CM | POA: Diagnosis not present

## 2023-01-31 DIAGNOSIS — I120 Hypertensive chronic kidney disease with stage 5 chronic kidney disease or end stage renal disease: Secondary | ICD-10-CM | POA: Diagnosis not present

## 2023-01-31 DIAGNOSIS — R197 Diarrhea, unspecified: Secondary | ICD-10-CM | POA: Diagnosis not present

## 2023-01-31 DIAGNOSIS — K760 Fatty (change of) liver, not elsewhere classified: Secondary | ICD-10-CM | POA: Diagnosis not present

## 2023-01-31 DIAGNOSIS — N186 End stage renal disease: Secondary | ICD-10-CM | POA: Diagnosis not present

## 2023-01-31 DIAGNOSIS — I10 Essential (primary) hypertension: Secondary | ICD-10-CM | POA: Diagnosis not present

## 2023-01-31 DIAGNOSIS — Z992 Dependence on renal dialysis: Secondary | ICD-10-CM | POA: Diagnosis not present

## 2023-01-31 DIAGNOSIS — Z5321 Procedure and treatment not carried out due to patient leaving prior to being seen by health care provider: Secondary | ICD-10-CM | POA: Diagnosis not present

## 2023-01-31 DIAGNOSIS — Z940 Kidney transplant status: Secondary | ICD-10-CM | POA: Diagnosis not present

## 2023-02-01 DIAGNOSIS — H81399 Other peripheral vertigo, unspecified ear: Secondary | ICD-10-CM | POA: Diagnosis not present

## 2023-02-04 DIAGNOSIS — E876 Hypokalemia: Secondary | ICD-10-CM | POA: Diagnosis not present

## 2023-02-04 DIAGNOSIS — N186 End stage renal disease: Secondary | ICD-10-CM | POA: Diagnosis not present

## 2023-02-04 DIAGNOSIS — Z992 Dependence on renal dialysis: Secondary | ICD-10-CM | POA: Diagnosis not present

## 2023-02-04 DIAGNOSIS — D508 Other iron deficiency anemias: Secondary | ICD-10-CM | POA: Diagnosis not present

## 2023-02-04 DIAGNOSIS — D631 Anemia in chronic kidney disease: Secondary | ICD-10-CM | POA: Diagnosis not present

## 2023-02-04 DIAGNOSIS — N2581 Secondary hyperparathyroidism of renal origin: Secondary | ICD-10-CM | POA: Diagnosis not present

## 2023-02-05 DIAGNOSIS — I10 Essential (primary) hypertension: Secondary | ICD-10-CM | POA: Diagnosis not present

## 2023-02-05 DIAGNOSIS — G4700 Insomnia, unspecified: Secondary | ICD-10-CM | POA: Diagnosis not present

## 2023-02-05 DIAGNOSIS — M5431 Sciatica, right side: Secondary | ICD-10-CM | POA: Diagnosis not present

## 2023-02-05 DIAGNOSIS — N184 Chronic kidney disease, stage 4 (severe): Secondary | ICD-10-CM | POA: Diagnosis not present

## 2023-02-06 DIAGNOSIS — D508 Other iron deficiency anemias: Secondary | ICD-10-CM | POA: Diagnosis not present

## 2023-02-06 DIAGNOSIS — Z992 Dependence on renal dialysis: Secondary | ICD-10-CM | POA: Diagnosis not present

## 2023-02-06 DIAGNOSIS — N186 End stage renal disease: Secondary | ICD-10-CM | POA: Diagnosis not present

## 2023-02-06 DIAGNOSIS — N2581 Secondary hyperparathyroidism of renal origin: Secondary | ICD-10-CM | POA: Diagnosis not present

## 2023-02-06 DIAGNOSIS — E876 Hypokalemia: Secondary | ICD-10-CM | POA: Diagnosis not present

## 2023-02-06 DIAGNOSIS — D631 Anemia in chronic kidney disease: Secondary | ICD-10-CM | POA: Diagnosis not present

## 2023-02-08 DIAGNOSIS — M94262 Chondromalacia, left knee: Secondary | ICD-10-CM | POA: Diagnosis not present

## 2023-02-08 DIAGNOSIS — D508 Other iron deficiency anemias: Secondary | ICD-10-CM | POA: Diagnosis not present

## 2023-02-08 DIAGNOSIS — N2581 Secondary hyperparathyroidism of renal origin: Secondary | ICD-10-CM | POA: Diagnosis not present

## 2023-02-08 DIAGNOSIS — N186 End stage renal disease: Secondary | ICD-10-CM | POA: Diagnosis not present

## 2023-02-08 DIAGNOSIS — E876 Hypokalemia: Secondary | ICD-10-CM | POA: Diagnosis not present

## 2023-02-08 DIAGNOSIS — D631 Anemia in chronic kidney disease: Secondary | ICD-10-CM | POA: Diagnosis not present

## 2023-02-08 DIAGNOSIS — Z992 Dependence on renal dialysis: Secondary | ICD-10-CM | POA: Diagnosis not present

## 2023-02-11 DIAGNOSIS — Z992 Dependence on renal dialysis: Secondary | ICD-10-CM | POA: Diagnosis not present

## 2023-02-11 DIAGNOSIS — E876 Hypokalemia: Secondary | ICD-10-CM | POA: Diagnosis not present

## 2023-02-11 DIAGNOSIS — D508 Other iron deficiency anemias: Secondary | ICD-10-CM | POA: Diagnosis not present

## 2023-02-11 DIAGNOSIS — N186 End stage renal disease: Secondary | ICD-10-CM | POA: Diagnosis not present

## 2023-02-11 DIAGNOSIS — D631 Anemia in chronic kidney disease: Secondary | ICD-10-CM | POA: Diagnosis not present

## 2023-02-11 DIAGNOSIS — N2581 Secondary hyperparathyroidism of renal origin: Secondary | ICD-10-CM | POA: Diagnosis not present

## 2023-02-13 DIAGNOSIS — D508 Other iron deficiency anemias: Secondary | ICD-10-CM | POA: Diagnosis not present

## 2023-02-13 DIAGNOSIS — N2581 Secondary hyperparathyroidism of renal origin: Secondary | ICD-10-CM | POA: Diagnosis not present

## 2023-02-13 DIAGNOSIS — E876 Hypokalemia: Secondary | ICD-10-CM | POA: Diagnosis not present

## 2023-02-13 DIAGNOSIS — N186 End stage renal disease: Secondary | ICD-10-CM | POA: Diagnosis not present

## 2023-02-13 DIAGNOSIS — D631 Anemia in chronic kidney disease: Secondary | ICD-10-CM | POA: Diagnosis not present

## 2023-02-13 DIAGNOSIS — Z992 Dependence on renal dialysis: Secondary | ICD-10-CM | POA: Diagnosis not present

## 2023-02-15 DIAGNOSIS — D508 Other iron deficiency anemias: Secondary | ICD-10-CM | POA: Diagnosis not present

## 2023-02-15 DIAGNOSIS — Z992 Dependence on renal dialysis: Secondary | ICD-10-CM | POA: Diagnosis not present

## 2023-02-15 DIAGNOSIS — N2581 Secondary hyperparathyroidism of renal origin: Secondary | ICD-10-CM | POA: Diagnosis not present

## 2023-02-15 DIAGNOSIS — D631 Anemia in chronic kidney disease: Secondary | ICD-10-CM | POA: Diagnosis not present

## 2023-02-15 DIAGNOSIS — E876 Hypokalemia: Secondary | ICD-10-CM | POA: Diagnosis not present

## 2023-02-15 DIAGNOSIS — N186 End stage renal disease: Secondary | ICD-10-CM | POA: Diagnosis not present

## 2023-02-18 DIAGNOSIS — D508 Other iron deficiency anemias: Secondary | ICD-10-CM | POA: Diagnosis not present

## 2023-02-18 DIAGNOSIS — Z992 Dependence on renal dialysis: Secondary | ICD-10-CM | POA: Diagnosis not present

## 2023-02-18 DIAGNOSIS — E876 Hypokalemia: Secondary | ICD-10-CM | POA: Diagnosis not present

## 2023-02-18 DIAGNOSIS — D631 Anemia in chronic kidney disease: Secondary | ICD-10-CM | POA: Diagnosis not present

## 2023-02-18 DIAGNOSIS — N2581 Secondary hyperparathyroidism of renal origin: Secondary | ICD-10-CM | POA: Diagnosis not present

## 2023-02-18 DIAGNOSIS — N186 End stage renal disease: Secondary | ICD-10-CM | POA: Diagnosis not present

## 2023-02-20 DIAGNOSIS — N2581 Secondary hyperparathyroidism of renal origin: Secondary | ICD-10-CM | POA: Diagnosis not present

## 2023-02-20 DIAGNOSIS — E876 Hypokalemia: Secondary | ICD-10-CM | POA: Diagnosis not present

## 2023-02-20 DIAGNOSIS — Z992 Dependence on renal dialysis: Secondary | ICD-10-CM | POA: Diagnosis not present

## 2023-02-20 DIAGNOSIS — D508 Other iron deficiency anemias: Secondary | ICD-10-CM | POA: Diagnosis not present

## 2023-02-20 DIAGNOSIS — D631 Anemia in chronic kidney disease: Secondary | ICD-10-CM | POA: Diagnosis not present

## 2023-02-20 DIAGNOSIS — N186 End stage renal disease: Secondary | ICD-10-CM | POA: Diagnosis not present

## 2023-02-21 DIAGNOSIS — I10 Essential (primary) hypertension: Secondary | ICD-10-CM | POA: Diagnosis not present

## 2023-02-21 DIAGNOSIS — E876 Hypokalemia: Secondary | ICD-10-CM | POA: Diagnosis not present

## 2023-02-21 DIAGNOSIS — Z79899 Other long term (current) drug therapy: Secondary | ICD-10-CM | POA: Diagnosis not present

## 2023-02-21 DIAGNOSIS — Z940 Kidney transplant status: Secondary | ICD-10-CM | POA: Diagnosis not present

## 2023-02-21 DIAGNOSIS — M109 Gout, unspecified: Secondary | ICD-10-CM | POA: Diagnosis not present

## 2023-02-21 DIAGNOSIS — E872 Acidosis, unspecified: Secondary | ICD-10-CM | POA: Diagnosis not present

## 2023-02-21 DIAGNOSIS — N2581 Secondary hyperparathyroidism of renal origin: Secondary | ICD-10-CM | POA: Diagnosis not present

## 2023-02-22 DIAGNOSIS — N2581 Secondary hyperparathyroidism of renal origin: Secondary | ICD-10-CM | POA: Diagnosis not present

## 2023-02-22 DIAGNOSIS — D631 Anemia in chronic kidney disease: Secondary | ICD-10-CM | POA: Diagnosis not present

## 2023-02-22 DIAGNOSIS — N186 End stage renal disease: Secondary | ICD-10-CM | POA: Diagnosis not present

## 2023-02-22 DIAGNOSIS — Z992 Dependence on renal dialysis: Secondary | ICD-10-CM | POA: Diagnosis not present

## 2023-02-22 DIAGNOSIS — E876 Hypokalemia: Secondary | ICD-10-CM | POA: Diagnosis not present

## 2023-02-22 DIAGNOSIS — D508 Other iron deficiency anemias: Secondary | ICD-10-CM | POA: Diagnosis not present

## 2023-02-25 DIAGNOSIS — E876 Hypokalemia: Secondary | ICD-10-CM | POA: Diagnosis not present

## 2023-02-25 DIAGNOSIS — D508 Other iron deficiency anemias: Secondary | ICD-10-CM | POA: Diagnosis not present

## 2023-02-25 DIAGNOSIS — N2581 Secondary hyperparathyroidism of renal origin: Secondary | ICD-10-CM | POA: Diagnosis not present

## 2023-02-25 DIAGNOSIS — N186 End stage renal disease: Secondary | ICD-10-CM | POA: Diagnosis not present

## 2023-02-25 DIAGNOSIS — Z992 Dependence on renal dialysis: Secondary | ICD-10-CM | POA: Diagnosis not present

## 2023-02-25 DIAGNOSIS — D631 Anemia in chronic kidney disease: Secondary | ICD-10-CM | POA: Diagnosis not present

## 2023-02-26 ENCOUNTER — Ambulatory Visit: Payer: Medicare Other | Admitting: Diagnostic Neuroimaging

## 2023-02-27 DIAGNOSIS — N2581 Secondary hyperparathyroidism of renal origin: Secondary | ICD-10-CM | POA: Diagnosis not present

## 2023-02-27 DIAGNOSIS — N186 End stage renal disease: Secondary | ICD-10-CM | POA: Diagnosis not present

## 2023-02-27 DIAGNOSIS — D631 Anemia in chronic kidney disease: Secondary | ICD-10-CM | POA: Diagnosis not present

## 2023-02-27 DIAGNOSIS — E876 Hypokalemia: Secondary | ICD-10-CM | POA: Diagnosis not present

## 2023-02-27 DIAGNOSIS — Z992 Dependence on renal dialysis: Secondary | ICD-10-CM | POA: Diagnosis not present

## 2023-02-27 DIAGNOSIS — D508 Other iron deficiency anemias: Secondary | ICD-10-CM | POA: Diagnosis not present

## 2023-02-28 DIAGNOSIS — R9082 White matter disease, unspecified: Secondary | ICD-10-CM | POA: Diagnosis not present

## 2023-02-28 DIAGNOSIS — H47011 Ischemic optic neuropathy, right eye: Secondary | ICD-10-CM | POA: Diagnosis not present

## 2023-03-01 DIAGNOSIS — D631 Anemia in chronic kidney disease: Secondary | ICD-10-CM | POA: Diagnosis not present

## 2023-03-01 DIAGNOSIS — Z992 Dependence on renal dialysis: Secondary | ICD-10-CM | POA: Diagnosis not present

## 2023-03-01 DIAGNOSIS — D508 Other iron deficiency anemias: Secondary | ICD-10-CM | POA: Diagnosis not present

## 2023-03-01 DIAGNOSIS — N2581 Secondary hyperparathyroidism of renal origin: Secondary | ICD-10-CM | POA: Diagnosis not present

## 2023-03-01 DIAGNOSIS — E876 Hypokalemia: Secondary | ICD-10-CM | POA: Diagnosis not present

## 2023-03-01 DIAGNOSIS — N186 End stage renal disease: Secondary | ICD-10-CM | POA: Diagnosis not present

## 2023-03-03 DIAGNOSIS — T8610 Unspecified complication of kidney transplant: Secondary | ICD-10-CM | POA: Diagnosis not present

## 2023-03-03 DIAGNOSIS — N186 End stage renal disease: Secondary | ICD-10-CM | POA: Diagnosis not present

## 2023-03-03 DIAGNOSIS — Z992 Dependence on renal dialysis: Secondary | ICD-10-CM | POA: Diagnosis not present

## 2023-03-04 DIAGNOSIS — N2581 Secondary hyperparathyroidism of renal origin: Secondary | ICD-10-CM | POA: Diagnosis not present

## 2023-03-04 DIAGNOSIS — Z992 Dependence on renal dialysis: Secondary | ICD-10-CM | POA: Diagnosis not present

## 2023-03-04 DIAGNOSIS — D689 Coagulation defect, unspecified: Secondary | ICD-10-CM | POA: Diagnosis not present

## 2023-03-04 DIAGNOSIS — N186 End stage renal disease: Secondary | ICD-10-CM | POA: Diagnosis not present

## 2023-03-06 DIAGNOSIS — N2581 Secondary hyperparathyroidism of renal origin: Secondary | ICD-10-CM | POA: Diagnosis not present

## 2023-03-06 DIAGNOSIS — N186 End stage renal disease: Secondary | ICD-10-CM | POA: Diagnosis not present

## 2023-03-06 DIAGNOSIS — D689 Coagulation defect, unspecified: Secondary | ICD-10-CM | POA: Diagnosis not present

## 2023-03-06 DIAGNOSIS — Z992 Dependence on renal dialysis: Secondary | ICD-10-CM | POA: Diagnosis not present

## 2023-03-07 ENCOUNTER — Other Ambulatory Visit: Payer: Self-pay | Admitting: Gastroenterology

## 2023-03-08 DIAGNOSIS — Z992 Dependence on renal dialysis: Secondary | ICD-10-CM | POA: Diagnosis not present

## 2023-03-08 DIAGNOSIS — N2581 Secondary hyperparathyroidism of renal origin: Secondary | ICD-10-CM | POA: Diagnosis not present

## 2023-03-08 DIAGNOSIS — N186 End stage renal disease: Secondary | ICD-10-CM | POA: Diagnosis not present

## 2023-03-08 DIAGNOSIS — D689 Coagulation defect, unspecified: Secondary | ICD-10-CM | POA: Diagnosis not present

## 2023-03-11 DIAGNOSIS — E876 Hypokalemia: Secondary | ICD-10-CM | POA: Diagnosis not present

## 2023-03-11 DIAGNOSIS — R300 Dysuria: Secondary | ICD-10-CM | POA: Diagnosis not present

## 2023-03-11 DIAGNOSIS — Z992 Dependence on renal dialysis: Secondary | ICD-10-CM | POA: Diagnosis not present

## 2023-03-11 DIAGNOSIS — D631 Anemia in chronic kidney disease: Secondary | ICD-10-CM | POA: Diagnosis not present

## 2023-03-11 DIAGNOSIS — N186 End stage renal disease: Secondary | ICD-10-CM | POA: Diagnosis not present

## 2023-03-11 DIAGNOSIS — D508 Other iron deficiency anemias: Secondary | ICD-10-CM | POA: Diagnosis not present

## 2023-03-11 DIAGNOSIS — N3001 Acute cystitis with hematuria: Secondary | ICD-10-CM | POA: Diagnosis not present

## 2023-03-11 DIAGNOSIS — N2581 Secondary hyperparathyroidism of renal origin: Secondary | ICD-10-CM | POA: Diagnosis not present

## 2023-03-13 DIAGNOSIS — D631 Anemia in chronic kidney disease: Secondary | ICD-10-CM | POA: Diagnosis not present

## 2023-03-13 DIAGNOSIS — N186 End stage renal disease: Secondary | ICD-10-CM | POA: Diagnosis not present

## 2023-03-13 DIAGNOSIS — N2581 Secondary hyperparathyroidism of renal origin: Secondary | ICD-10-CM | POA: Diagnosis not present

## 2023-03-13 DIAGNOSIS — Z992 Dependence on renal dialysis: Secondary | ICD-10-CM | POA: Diagnosis not present

## 2023-03-13 DIAGNOSIS — E876 Hypokalemia: Secondary | ICD-10-CM | POA: Diagnosis not present

## 2023-03-13 DIAGNOSIS — D508 Other iron deficiency anemias: Secondary | ICD-10-CM | POA: Diagnosis not present

## 2023-03-15 DIAGNOSIS — D631 Anemia in chronic kidney disease: Secondary | ICD-10-CM | POA: Diagnosis not present

## 2023-03-15 DIAGNOSIS — E876 Hypokalemia: Secondary | ICD-10-CM | POA: Diagnosis not present

## 2023-03-15 DIAGNOSIS — N186 End stage renal disease: Secondary | ICD-10-CM | POA: Diagnosis not present

## 2023-03-15 DIAGNOSIS — Z992 Dependence on renal dialysis: Secondary | ICD-10-CM | POA: Diagnosis not present

## 2023-03-15 DIAGNOSIS — N2581 Secondary hyperparathyroidism of renal origin: Secondary | ICD-10-CM | POA: Diagnosis not present

## 2023-03-15 DIAGNOSIS — D508 Other iron deficiency anemias: Secondary | ICD-10-CM | POA: Diagnosis not present

## 2023-03-18 DIAGNOSIS — Z992 Dependence on renal dialysis: Secondary | ICD-10-CM | POA: Diagnosis not present

## 2023-03-18 DIAGNOSIS — N2581 Secondary hyperparathyroidism of renal origin: Secondary | ICD-10-CM | POA: Diagnosis not present

## 2023-03-18 DIAGNOSIS — N186 End stage renal disease: Secondary | ICD-10-CM | POA: Diagnosis not present

## 2023-03-18 DIAGNOSIS — E876 Hypokalemia: Secondary | ICD-10-CM | POA: Diagnosis not present

## 2023-03-18 DIAGNOSIS — D631 Anemia in chronic kidney disease: Secondary | ICD-10-CM | POA: Diagnosis not present

## 2023-03-18 DIAGNOSIS — D508 Other iron deficiency anemias: Secondary | ICD-10-CM | POA: Diagnosis not present

## 2023-03-20 DIAGNOSIS — D508 Other iron deficiency anemias: Secondary | ICD-10-CM | POA: Diagnosis not present

## 2023-03-20 DIAGNOSIS — N2581 Secondary hyperparathyroidism of renal origin: Secondary | ICD-10-CM | POA: Diagnosis not present

## 2023-03-20 DIAGNOSIS — Z992 Dependence on renal dialysis: Secondary | ICD-10-CM | POA: Diagnosis not present

## 2023-03-20 DIAGNOSIS — E876 Hypokalemia: Secondary | ICD-10-CM | POA: Diagnosis not present

## 2023-03-20 DIAGNOSIS — D631 Anemia in chronic kidney disease: Secondary | ICD-10-CM | POA: Diagnosis not present

## 2023-03-20 DIAGNOSIS — N186 End stage renal disease: Secondary | ICD-10-CM | POA: Diagnosis not present

## 2023-03-22 DIAGNOSIS — N2581 Secondary hyperparathyroidism of renal origin: Secondary | ICD-10-CM | POA: Diagnosis not present

## 2023-03-22 DIAGNOSIS — Z992 Dependence on renal dialysis: Secondary | ICD-10-CM | POA: Diagnosis not present

## 2023-03-22 DIAGNOSIS — N186 End stage renal disease: Secondary | ICD-10-CM | POA: Diagnosis not present

## 2023-03-22 DIAGNOSIS — E876 Hypokalemia: Secondary | ICD-10-CM | POA: Diagnosis not present

## 2023-03-22 DIAGNOSIS — D508 Other iron deficiency anemias: Secondary | ICD-10-CM | POA: Diagnosis not present

## 2023-03-22 DIAGNOSIS — D631 Anemia in chronic kidney disease: Secondary | ICD-10-CM | POA: Diagnosis not present

## 2023-03-25 DIAGNOSIS — D631 Anemia in chronic kidney disease: Secondary | ICD-10-CM | POA: Diagnosis not present

## 2023-03-25 DIAGNOSIS — N186 End stage renal disease: Secondary | ICD-10-CM | POA: Diagnosis not present

## 2023-03-25 DIAGNOSIS — Z992 Dependence on renal dialysis: Secondary | ICD-10-CM | POA: Diagnosis not present

## 2023-03-25 DIAGNOSIS — E876 Hypokalemia: Secondary | ICD-10-CM | POA: Diagnosis not present

## 2023-03-25 DIAGNOSIS — D508 Other iron deficiency anemias: Secondary | ICD-10-CM | POA: Diagnosis not present

## 2023-03-25 DIAGNOSIS — N2581 Secondary hyperparathyroidism of renal origin: Secondary | ICD-10-CM | POA: Diagnosis not present

## 2023-03-27 DIAGNOSIS — N2581 Secondary hyperparathyroidism of renal origin: Secondary | ICD-10-CM | POA: Diagnosis not present

## 2023-03-27 DIAGNOSIS — E876 Hypokalemia: Secondary | ICD-10-CM | POA: Diagnosis not present

## 2023-03-27 DIAGNOSIS — D631 Anemia in chronic kidney disease: Secondary | ICD-10-CM | POA: Diagnosis not present

## 2023-03-27 DIAGNOSIS — Z992 Dependence on renal dialysis: Secondary | ICD-10-CM | POA: Diagnosis not present

## 2023-03-27 DIAGNOSIS — N186 End stage renal disease: Secondary | ICD-10-CM | POA: Diagnosis not present

## 2023-03-27 DIAGNOSIS — D508 Other iron deficiency anemias: Secondary | ICD-10-CM | POA: Diagnosis not present

## 2023-03-27 DIAGNOSIS — D013 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: Secondary | ICD-10-CM | POA: Diagnosis not present

## 2023-03-29 DIAGNOSIS — D508 Other iron deficiency anemias: Secondary | ICD-10-CM | POA: Diagnosis not present

## 2023-03-29 DIAGNOSIS — N2581 Secondary hyperparathyroidism of renal origin: Secondary | ICD-10-CM | POA: Diagnosis not present

## 2023-03-29 DIAGNOSIS — E876 Hypokalemia: Secondary | ICD-10-CM | POA: Diagnosis not present

## 2023-03-29 DIAGNOSIS — D631 Anemia in chronic kidney disease: Secondary | ICD-10-CM | POA: Diagnosis not present

## 2023-03-29 DIAGNOSIS — N186 End stage renal disease: Secondary | ICD-10-CM | POA: Diagnosis not present

## 2023-03-29 DIAGNOSIS — Z992 Dependence on renal dialysis: Secondary | ICD-10-CM | POA: Diagnosis not present

## 2023-04-01 DIAGNOSIS — D631 Anemia in chronic kidney disease: Secondary | ICD-10-CM | POA: Diagnosis not present

## 2023-04-01 DIAGNOSIS — N2581 Secondary hyperparathyroidism of renal origin: Secondary | ICD-10-CM | POA: Diagnosis not present

## 2023-04-01 DIAGNOSIS — Z992 Dependence on renal dialysis: Secondary | ICD-10-CM | POA: Diagnosis not present

## 2023-04-01 DIAGNOSIS — N186 End stage renal disease: Secondary | ICD-10-CM | POA: Diagnosis not present

## 2023-04-01 DIAGNOSIS — D508 Other iron deficiency anemias: Secondary | ICD-10-CM | POA: Diagnosis not present

## 2023-04-01 DIAGNOSIS — E876 Hypokalemia: Secondary | ICD-10-CM | POA: Diagnosis not present

## 2023-04-03 DIAGNOSIS — E876 Hypokalemia: Secondary | ICD-10-CM | POA: Diagnosis not present

## 2023-04-03 DIAGNOSIS — Z992 Dependence on renal dialysis: Secondary | ICD-10-CM | POA: Diagnosis not present

## 2023-04-03 DIAGNOSIS — D631 Anemia in chronic kidney disease: Secondary | ICD-10-CM | POA: Diagnosis not present

## 2023-04-03 DIAGNOSIS — D508 Other iron deficiency anemias: Secondary | ICD-10-CM | POA: Diagnosis not present

## 2023-04-03 DIAGNOSIS — N186 End stage renal disease: Secondary | ICD-10-CM | POA: Diagnosis not present

## 2023-04-03 DIAGNOSIS — N2581 Secondary hyperparathyroidism of renal origin: Secondary | ICD-10-CM | POA: Diagnosis not present

## 2023-04-03 DIAGNOSIS — T8610 Unspecified complication of kidney transplant: Secondary | ICD-10-CM | POA: Diagnosis not present

## 2023-04-05 DIAGNOSIS — N186 End stage renal disease: Secondary | ICD-10-CM | POA: Diagnosis not present

## 2023-04-05 DIAGNOSIS — N2581 Secondary hyperparathyroidism of renal origin: Secondary | ICD-10-CM | POA: Diagnosis not present

## 2023-04-05 DIAGNOSIS — Z992 Dependence on renal dialysis: Secondary | ICD-10-CM | POA: Diagnosis not present

## 2023-04-05 DIAGNOSIS — D631 Anemia in chronic kidney disease: Secondary | ICD-10-CM | POA: Diagnosis not present

## 2023-04-05 DIAGNOSIS — E876 Hypokalemia: Secondary | ICD-10-CM | POA: Diagnosis not present

## 2023-04-08 DIAGNOSIS — E876 Hypokalemia: Secondary | ICD-10-CM | POA: Diagnosis not present

## 2023-04-08 DIAGNOSIS — D631 Anemia in chronic kidney disease: Secondary | ICD-10-CM | POA: Diagnosis not present

## 2023-04-08 DIAGNOSIS — N2581 Secondary hyperparathyroidism of renal origin: Secondary | ICD-10-CM | POA: Diagnosis not present

## 2023-04-08 DIAGNOSIS — N186 End stage renal disease: Secondary | ICD-10-CM | POA: Diagnosis not present

## 2023-04-08 DIAGNOSIS — Z992 Dependence on renal dialysis: Secondary | ICD-10-CM | POA: Diagnosis not present

## 2023-04-10 DIAGNOSIS — E876 Hypokalemia: Secondary | ICD-10-CM | POA: Diagnosis not present

## 2023-04-10 DIAGNOSIS — N2581 Secondary hyperparathyroidism of renal origin: Secondary | ICD-10-CM | POA: Diagnosis not present

## 2023-04-10 DIAGNOSIS — D631 Anemia in chronic kidney disease: Secondary | ICD-10-CM | POA: Diagnosis not present

## 2023-04-10 DIAGNOSIS — Z992 Dependence on renal dialysis: Secondary | ICD-10-CM | POA: Diagnosis not present

## 2023-04-10 DIAGNOSIS — N186 End stage renal disease: Secondary | ICD-10-CM | POA: Diagnosis not present

## 2023-04-12 DIAGNOSIS — D631 Anemia in chronic kidney disease: Secondary | ICD-10-CM | POA: Diagnosis not present

## 2023-04-12 DIAGNOSIS — E876 Hypokalemia: Secondary | ICD-10-CM | POA: Diagnosis not present

## 2023-04-12 DIAGNOSIS — Z992 Dependence on renal dialysis: Secondary | ICD-10-CM | POA: Diagnosis not present

## 2023-04-12 DIAGNOSIS — N186 End stage renal disease: Secondary | ICD-10-CM | POA: Diagnosis not present

## 2023-04-12 DIAGNOSIS — R350 Frequency of micturition: Secondary | ICD-10-CM | POA: Diagnosis not present

## 2023-04-12 DIAGNOSIS — N2581 Secondary hyperparathyroidism of renal origin: Secondary | ICD-10-CM | POA: Diagnosis not present

## 2023-04-15 DIAGNOSIS — Z992 Dependence on renal dialysis: Secondary | ICD-10-CM | POA: Diagnosis not present

## 2023-04-15 DIAGNOSIS — D631 Anemia in chronic kidney disease: Secondary | ICD-10-CM | POA: Diagnosis not present

## 2023-04-15 DIAGNOSIS — N186 End stage renal disease: Secondary | ICD-10-CM | POA: Diagnosis not present

## 2023-04-15 DIAGNOSIS — E876 Hypokalemia: Secondary | ICD-10-CM | POA: Diagnosis not present

## 2023-04-15 DIAGNOSIS — N2581 Secondary hyperparathyroidism of renal origin: Secondary | ICD-10-CM | POA: Diagnosis not present

## 2023-04-17 DIAGNOSIS — Z992 Dependence on renal dialysis: Secondary | ICD-10-CM | POA: Diagnosis not present

## 2023-04-17 DIAGNOSIS — N186 End stage renal disease: Secondary | ICD-10-CM | POA: Diagnosis not present

## 2023-04-17 DIAGNOSIS — D631 Anemia in chronic kidney disease: Secondary | ICD-10-CM | POA: Diagnosis not present

## 2023-04-17 DIAGNOSIS — E876 Hypokalemia: Secondary | ICD-10-CM | POA: Diagnosis not present

## 2023-04-17 DIAGNOSIS — N2581 Secondary hyperparathyroidism of renal origin: Secondary | ICD-10-CM | POA: Diagnosis not present

## 2023-04-19 DIAGNOSIS — Z992 Dependence on renal dialysis: Secondary | ICD-10-CM | POA: Diagnosis not present

## 2023-04-19 DIAGNOSIS — N2581 Secondary hyperparathyroidism of renal origin: Secondary | ICD-10-CM | POA: Diagnosis not present

## 2023-04-19 DIAGNOSIS — N186 End stage renal disease: Secondary | ICD-10-CM | POA: Diagnosis not present

## 2023-04-19 DIAGNOSIS — E876 Hypokalemia: Secondary | ICD-10-CM | POA: Diagnosis not present

## 2023-04-19 DIAGNOSIS — D631 Anemia in chronic kidney disease: Secondary | ICD-10-CM | POA: Diagnosis not present

## 2023-04-22 DIAGNOSIS — N186 End stage renal disease: Secondary | ICD-10-CM | POA: Diagnosis not present

## 2023-04-22 DIAGNOSIS — Z992 Dependence on renal dialysis: Secondary | ICD-10-CM | POA: Diagnosis not present

## 2023-04-22 DIAGNOSIS — D631 Anemia in chronic kidney disease: Secondary | ICD-10-CM | POA: Diagnosis not present

## 2023-04-22 DIAGNOSIS — E876 Hypokalemia: Secondary | ICD-10-CM | POA: Diagnosis not present

## 2023-04-22 DIAGNOSIS — N2581 Secondary hyperparathyroidism of renal origin: Secondary | ICD-10-CM | POA: Diagnosis not present

## 2023-04-24 DIAGNOSIS — D631 Anemia in chronic kidney disease: Secondary | ICD-10-CM | POA: Diagnosis not present

## 2023-04-24 DIAGNOSIS — Z992 Dependence on renal dialysis: Secondary | ICD-10-CM | POA: Diagnosis not present

## 2023-04-24 DIAGNOSIS — N186 End stage renal disease: Secondary | ICD-10-CM | POA: Diagnosis not present

## 2023-04-24 DIAGNOSIS — N2581 Secondary hyperparathyroidism of renal origin: Secondary | ICD-10-CM | POA: Diagnosis not present

## 2023-04-24 DIAGNOSIS — E876 Hypokalemia: Secondary | ICD-10-CM | POA: Diagnosis not present

## 2023-04-25 DIAGNOSIS — M722 Plantar fascial fibromatosis: Secondary | ICD-10-CM | POA: Diagnosis not present

## 2023-04-25 DIAGNOSIS — M79672 Pain in left foot: Secondary | ICD-10-CM | POA: Diagnosis not present

## 2023-04-26 DIAGNOSIS — N2581 Secondary hyperparathyroidism of renal origin: Secondary | ICD-10-CM | POA: Diagnosis not present

## 2023-04-26 DIAGNOSIS — N186 End stage renal disease: Secondary | ICD-10-CM | POA: Diagnosis not present

## 2023-04-26 DIAGNOSIS — E876 Hypokalemia: Secondary | ICD-10-CM | POA: Diagnosis not present

## 2023-04-26 DIAGNOSIS — G4733 Obstructive sleep apnea (adult) (pediatric): Secondary | ICD-10-CM | POA: Diagnosis not present

## 2023-04-26 DIAGNOSIS — D631 Anemia in chronic kidney disease: Secondary | ICD-10-CM | POA: Diagnosis not present

## 2023-04-26 DIAGNOSIS — Z992 Dependence on renal dialysis: Secondary | ICD-10-CM | POA: Diagnosis not present

## 2023-04-29 DIAGNOSIS — D631 Anemia in chronic kidney disease: Secondary | ICD-10-CM | POA: Diagnosis not present

## 2023-04-29 DIAGNOSIS — N186 End stage renal disease: Secondary | ICD-10-CM | POA: Diagnosis not present

## 2023-04-29 DIAGNOSIS — N2581 Secondary hyperparathyroidism of renal origin: Secondary | ICD-10-CM | POA: Diagnosis not present

## 2023-04-29 DIAGNOSIS — Z992 Dependence on renal dialysis: Secondary | ICD-10-CM | POA: Diagnosis not present

## 2023-04-29 DIAGNOSIS — E876 Hypokalemia: Secondary | ICD-10-CM | POA: Diagnosis not present

## 2023-04-30 ENCOUNTER — Ambulatory Visit (INDEPENDENT_AMBULATORY_CARE_PROVIDER_SITE_OTHER): Payer: 59 | Admitting: Podiatry

## 2023-04-30 ENCOUNTER — Ambulatory Visit (INDEPENDENT_AMBULATORY_CARE_PROVIDER_SITE_OTHER): Payer: 59

## 2023-04-30 DIAGNOSIS — M778 Other enthesopathies, not elsewhere classified: Secondary | ICD-10-CM

## 2023-04-30 DIAGNOSIS — M7662 Achilles tendinitis, left leg: Secondary | ICD-10-CM | POA: Diagnosis not present

## 2023-04-30 DIAGNOSIS — M722 Plantar fascial fibromatosis: Secondary | ICD-10-CM | POA: Diagnosis not present

## 2023-04-30 MED ORDER — MELOXICAM 15 MG PO TABS
15.0000 mg | ORAL_TABLET | Freq: Every day | ORAL | 0 refills | Status: DC
Start: 2023-04-30 — End: 2023-09-19

## 2023-04-30 NOTE — Patient Instructions (Signed)

## 2023-04-30 NOTE — Progress Notes (Signed)
  Subjective:  Patient ID: Ariel [name], female    DOB: 23-Aug-1976,  MRN: [id number]  Chief Complaint  Patient presents with   Foot Pain    C/o left heel pain radiating to the achilles tendon. Patient was seen at urgent care and was told she had a bone spur to heel and referred her to a podiatrist.     47 y.o. female presents with the above complaint. Patient presents for pain in the left heel that also radiates to the back of the heel.  Pain is primarily on the bottom of the heel.  Patient states first few steps out of bed are very sensitive in the morning.  Patient also notes some pulling sensation in the Achilles and a bump at the back of the heel.   Review of Systems: Negative except as noted in the HPI. Denies N/V/F/Ch.   Objective:  There were no vitals filed for this visit. There is no height or weight on file to calculate BMI. Constitutional Well developed. Well nourished.  Vascular Dorsalis pedis pulses palpable bilaterally. Posterior tibial pulses palpable bilaterally. Capillary refill normal to all digits.  No cyanosis or clubbing noted. Pedal hair growth normal.  Neurologic Normal speech. Oriented to person, place, and time. Epicritic sensation to light touch grossly present bilaterally.  Dermatologic Nails well groomed and normal in appearance. No open wounds. No skin lesions.  Orthopedic: Normal joint ROM without pain or crepitus bilaterally. No visible deformities. Tender to palpation at the calcaneal tuber left. No pain with calcaneal squeeze left. Ankle ROM diminished range with pain left. Silfverskiold Test: positive left. -Osseous prominence noted to the posterior aspect of the left calcaneus   Radiographs: Taken and reviewed. No acute fractures or dislocations. No evidence of stress fracture.  Plantar heel spur present. Posterior heel spur absent however there is Haglund's deformity present at the posterior aspect of the left superior posterior  calcaneus.   Assessment:   1. Plantar fasciitis, left   2. Achilles tendinitis, left leg   3. Capsulitis of foot, left    Plan:  Patient was evaluated and treated and all questions answered.  Plantar Fasciitis, left - XR reviewed as above.  - Educated on icing and stretching. Instructions given.  - Injection delivered to the plantar fascia as below. - DME: Night splint dispensed.  PowerStep orthotics dispensed - Pharmacologic management: Meloxicam 15 mg once daily for 30 days. Educated on risks/benefits and proper taking of medication.  Procedure: Injection Tendon/Ligament Location: Left plantar fascia at the glabrous junction; medial approach. Skin Prep: alcohol Injectate: 1 cc 0.5% marcaine plain, 1 cc kenalog 10. Disposition: Patient tolerated procedure well. Injection site dressed with a band-aid.  # Achilles tendinitis of left lower extremity -Recommend above therapies stretching icing anti-inflammatories as needed  Return in about 4 weeks (around 05/28/2023) for f/u L PF. Achilles tendinitis.

## 2023-04-30 NOTE — Addendum Note (Signed)
Addended by: Carlena Hurl F on: 04/30/2023 04:02 PM   Modules accepted: Orders

## 2023-05-01 DIAGNOSIS — N186 End stage renal disease: Secondary | ICD-10-CM | POA: Diagnosis not present

## 2023-05-01 DIAGNOSIS — E876 Hypokalemia: Secondary | ICD-10-CM | POA: Diagnosis not present

## 2023-05-01 DIAGNOSIS — Z992 Dependence on renal dialysis: Secondary | ICD-10-CM | POA: Diagnosis not present

## 2023-05-01 DIAGNOSIS — N2581 Secondary hyperparathyroidism of renal origin: Secondary | ICD-10-CM | POA: Diagnosis not present

## 2023-05-01 DIAGNOSIS — D631 Anemia in chronic kidney disease: Secondary | ICD-10-CM | POA: Diagnosis not present

## 2023-05-02 DIAGNOSIS — G4736 Sleep related hypoventilation in conditions classified elsewhere: Secondary | ICD-10-CM | POA: Diagnosis not present

## 2023-05-02 DIAGNOSIS — R0683 Snoring: Secondary | ICD-10-CM | POA: Diagnosis not present

## 2023-05-02 DIAGNOSIS — R5383 Other fatigue: Secondary | ICD-10-CM | POA: Diagnosis not present

## 2023-05-03 DIAGNOSIS — Z992 Dependence on renal dialysis: Secondary | ICD-10-CM | POA: Diagnosis not present

## 2023-05-03 DIAGNOSIS — D631 Anemia in chronic kidney disease: Secondary | ICD-10-CM | POA: Diagnosis not present

## 2023-05-03 DIAGNOSIS — E876 Hypokalemia: Secondary | ICD-10-CM | POA: Diagnosis not present

## 2023-05-03 DIAGNOSIS — N2581 Secondary hyperparathyroidism of renal origin: Secondary | ICD-10-CM | POA: Diagnosis not present

## 2023-05-03 DIAGNOSIS — N186 End stage renal disease: Secondary | ICD-10-CM | POA: Diagnosis not present

## 2023-05-04 DIAGNOSIS — N186 End stage renal disease: Secondary | ICD-10-CM | POA: Diagnosis not present

## 2023-05-04 DIAGNOSIS — Z992 Dependence on renal dialysis: Secondary | ICD-10-CM | POA: Diagnosis not present

## 2023-05-04 DIAGNOSIS — T8610 Unspecified complication of kidney transplant: Secondary | ICD-10-CM | POA: Diagnosis not present

## 2023-05-06 DIAGNOSIS — Z992 Dependence on renal dialysis: Secondary | ICD-10-CM | POA: Diagnosis not present

## 2023-05-06 DIAGNOSIS — E876 Hypokalemia: Secondary | ICD-10-CM | POA: Diagnosis not present

## 2023-05-06 DIAGNOSIS — D631 Anemia in chronic kidney disease: Secondary | ICD-10-CM | POA: Diagnosis not present

## 2023-05-06 DIAGNOSIS — N186 End stage renal disease: Secondary | ICD-10-CM | POA: Diagnosis not present

## 2023-05-06 DIAGNOSIS — N2581 Secondary hyperparathyroidism of renal origin: Secondary | ICD-10-CM | POA: Diagnosis not present

## 2023-05-07 DIAGNOSIS — Z940 Kidney transplant status: Secondary | ICD-10-CM | POA: Diagnosis not present

## 2023-05-07 DIAGNOSIS — Z992 Dependence on renal dialysis: Secondary | ICD-10-CM | POA: Diagnosis not present

## 2023-05-07 DIAGNOSIS — I120 Hypertensive chronic kidney disease with stage 5 chronic kidney disease or end stage renal disease: Secondary | ICD-10-CM | POA: Diagnosis not present

## 2023-05-07 DIAGNOSIS — M542 Cervicalgia: Secondary | ICD-10-CM | POA: Diagnosis not present

## 2023-05-07 DIAGNOSIS — N186 End stage renal disease: Secondary | ICD-10-CM | POA: Diagnosis not present

## 2023-05-07 DIAGNOSIS — R519 Headache, unspecified: Secondary | ICD-10-CM | POA: Diagnosis not present

## 2023-05-08 DIAGNOSIS — M542 Cervicalgia: Secondary | ICD-10-CM | POA: Diagnosis not present

## 2023-05-08 DIAGNOSIS — I10 Essential (primary) hypertension: Secondary | ICD-10-CM | POA: Diagnosis not present

## 2023-05-08 DIAGNOSIS — N185 Chronic kidney disease, stage 5: Secondary | ICD-10-CM | POA: Diagnosis not present

## 2023-05-09 DIAGNOSIS — R519 Headache, unspecified: Secondary | ICD-10-CM | POA: Insufficient documentation

## 2023-05-09 DIAGNOSIS — N186 End stage renal disease: Secondary | ICD-10-CM | POA: Diagnosis not present

## 2023-05-09 DIAGNOSIS — I2089 Other forms of angina pectoris: Secondary | ICD-10-CM | POA: Diagnosis not present

## 2023-05-09 DIAGNOSIS — Z9189 Other specified personal risk factors, not elsewhere classified: Secondary | ICD-10-CM | POA: Diagnosis not present

## 2023-05-10 DIAGNOSIS — D631 Anemia in chronic kidney disease: Secondary | ICD-10-CM | POA: Diagnosis not present

## 2023-05-10 DIAGNOSIS — Z992 Dependence on renal dialysis: Secondary | ICD-10-CM | POA: Diagnosis not present

## 2023-05-10 DIAGNOSIS — E876 Hypokalemia: Secondary | ICD-10-CM | POA: Diagnosis not present

## 2023-05-10 DIAGNOSIS — N186 End stage renal disease: Secondary | ICD-10-CM | POA: Diagnosis not present

## 2023-05-10 DIAGNOSIS — N2581 Secondary hyperparathyroidism of renal origin: Secondary | ICD-10-CM | POA: Diagnosis not present

## 2023-05-13 DIAGNOSIS — E876 Hypokalemia: Secondary | ICD-10-CM | POA: Diagnosis not present

## 2023-05-13 DIAGNOSIS — N2581 Secondary hyperparathyroidism of renal origin: Secondary | ICD-10-CM | POA: Diagnosis not present

## 2023-05-13 DIAGNOSIS — N186 End stage renal disease: Secondary | ICD-10-CM | POA: Diagnosis not present

## 2023-05-13 DIAGNOSIS — Z992 Dependence on renal dialysis: Secondary | ICD-10-CM | POA: Diagnosis not present

## 2023-05-13 DIAGNOSIS — D631 Anemia in chronic kidney disease: Secondary | ICD-10-CM | POA: Diagnosis not present

## 2023-05-14 DIAGNOSIS — M47812 Spondylosis without myelopathy or radiculopathy, cervical region: Secondary | ICD-10-CM | POA: Diagnosis not present

## 2023-05-15 DIAGNOSIS — Z992 Dependence on renal dialysis: Secondary | ICD-10-CM | POA: Diagnosis not present

## 2023-05-15 DIAGNOSIS — N2581 Secondary hyperparathyroidism of renal origin: Secondary | ICD-10-CM | POA: Diagnosis not present

## 2023-05-15 DIAGNOSIS — D631 Anemia in chronic kidney disease: Secondary | ICD-10-CM | POA: Diagnosis not present

## 2023-05-15 DIAGNOSIS — N186 End stage renal disease: Secondary | ICD-10-CM | POA: Diagnosis not present

## 2023-05-15 DIAGNOSIS — E876 Hypokalemia: Secondary | ICD-10-CM | POA: Diagnosis not present

## 2023-05-16 DIAGNOSIS — M1990 Unspecified osteoarthritis, unspecified site: Secondary | ICD-10-CM | POA: Diagnosis not present

## 2023-05-16 DIAGNOSIS — G4700 Insomnia, unspecified: Secondary | ICD-10-CM | POA: Diagnosis not present

## 2023-05-16 DIAGNOSIS — I120 Hypertensive chronic kidney disease with stage 5 chronic kidney disease or end stage renal disease: Secondary | ICD-10-CM | POA: Diagnosis not present

## 2023-05-16 DIAGNOSIS — Z888 Allergy status to other drugs, medicaments and biological substances status: Secondary | ICD-10-CM | POA: Diagnosis not present

## 2023-05-16 DIAGNOSIS — M109 Gout, unspecified: Secondary | ICD-10-CM | POA: Diagnosis not present

## 2023-05-16 DIAGNOSIS — Z885 Allergy status to narcotic agent status: Secondary | ICD-10-CM | POA: Diagnosis not present

## 2023-05-16 DIAGNOSIS — N186 End stage renal disease: Secondary | ICD-10-CM | POA: Diagnosis not present

## 2023-05-16 DIAGNOSIS — Z9109 Other allergy status, other than to drugs and biological substances: Secondary | ICD-10-CM | POA: Diagnosis not present

## 2023-05-16 DIAGNOSIS — E785 Hyperlipidemia, unspecified: Secondary | ICD-10-CM | POA: Diagnosis not present

## 2023-05-16 DIAGNOSIS — I209 Angina pectoris, unspecified: Secondary | ICD-10-CM | POA: Diagnosis not present

## 2023-05-16 DIAGNOSIS — L810 Postinflammatory hyperpigmentation: Secondary | ICD-10-CM | POA: Diagnosis not present

## 2023-05-16 DIAGNOSIS — K219 Gastro-esophageal reflux disease without esophagitis: Secondary | ICD-10-CM | POA: Diagnosis not present

## 2023-05-16 DIAGNOSIS — D649 Anemia, unspecified: Secondary | ICD-10-CM | POA: Diagnosis not present

## 2023-05-16 DIAGNOSIS — Z940 Kidney transplant status: Secondary | ICD-10-CM | POA: Diagnosis not present

## 2023-05-17 DIAGNOSIS — Z992 Dependence on renal dialysis: Secondary | ICD-10-CM | POA: Diagnosis not present

## 2023-05-17 DIAGNOSIS — E876 Hypokalemia: Secondary | ICD-10-CM | POA: Diagnosis not present

## 2023-05-17 DIAGNOSIS — D631 Anemia in chronic kidney disease: Secondary | ICD-10-CM | POA: Diagnosis not present

## 2023-05-17 DIAGNOSIS — N186 End stage renal disease: Secondary | ICD-10-CM | POA: Diagnosis not present

## 2023-05-17 DIAGNOSIS — N2581 Secondary hyperparathyroidism of renal origin: Secondary | ICD-10-CM | POA: Diagnosis not present

## 2023-05-20 DIAGNOSIS — R5383 Other fatigue: Secondary | ICD-10-CM | POA: Diagnosis not present

## 2023-05-20 DIAGNOSIS — G4733 Obstructive sleep apnea (adult) (pediatric): Secondary | ICD-10-CM | POA: Diagnosis not present

## 2023-05-20 DIAGNOSIS — E876 Hypokalemia: Secondary | ICD-10-CM | POA: Diagnosis not present

## 2023-05-20 DIAGNOSIS — N2581 Secondary hyperparathyroidism of renal origin: Secondary | ICD-10-CM | POA: Diagnosis not present

## 2023-05-20 DIAGNOSIS — Z992 Dependence on renal dialysis: Secondary | ICD-10-CM | POA: Diagnosis not present

## 2023-05-20 DIAGNOSIS — D631 Anemia in chronic kidney disease: Secondary | ICD-10-CM | POA: Diagnosis not present

## 2023-05-20 DIAGNOSIS — N186 End stage renal disease: Secondary | ICD-10-CM | POA: Diagnosis not present

## 2023-05-21 DIAGNOSIS — M50323 Other cervical disc degeneration at C6-C7 level: Secondary | ICD-10-CM | POA: Diagnosis not present

## 2023-05-21 DIAGNOSIS — M47812 Spondylosis without myelopathy or radiculopathy, cervical region: Secondary | ICD-10-CM | POA: Diagnosis not present

## 2023-05-21 DIAGNOSIS — I771 Stricture of artery: Secondary | ICD-10-CM | POA: Diagnosis not present

## 2023-05-22 DIAGNOSIS — Z992 Dependence on renal dialysis: Secondary | ICD-10-CM | POA: Diagnosis not present

## 2023-05-22 DIAGNOSIS — E876 Hypokalemia: Secondary | ICD-10-CM | POA: Diagnosis not present

## 2023-05-22 DIAGNOSIS — N2581 Secondary hyperparathyroidism of renal origin: Secondary | ICD-10-CM | POA: Diagnosis not present

## 2023-05-22 DIAGNOSIS — D631 Anemia in chronic kidney disease: Secondary | ICD-10-CM | POA: Diagnosis not present

## 2023-05-22 DIAGNOSIS — N186 End stage renal disease: Secondary | ICD-10-CM | POA: Diagnosis not present

## 2023-05-24 DIAGNOSIS — N2581 Secondary hyperparathyroidism of renal origin: Secondary | ICD-10-CM | POA: Diagnosis not present

## 2023-05-24 DIAGNOSIS — E876 Hypokalemia: Secondary | ICD-10-CM | POA: Diagnosis not present

## 2023-05-24 DIAGNOSIS — Z992 Dependence on renal dialysis: Secondary | ICD-10-CM | POA: Diagnosis not present

## 2023-05-24 DIAGNOSIS — D631 Anemia in chronic kidney disease: Secondary | ICD-10-CM | POA: Diagnosis not present

## 2023-05-24 DIAGNOSIS — N186 End stage renal disease: Secondary | ICD-10-CM | POA: Diagnosis not present

## 2023-05-27 DIAGNOSIS — Z992 Dependence on renal dialysis: Secondary | ICD-10-CM | POA: Diagnosis not present

## 2023-05-27 DIAGNOSIS — N186 End stage renal disease: Secondary | ICD-10-CM | POA: Diagnosis not present

## 2023-05-27 DIAGNOSIS — E876 Hypokalemia: Secondary | ICD-10-CM | POA: Diagnosis not present

## 2023-05-27 DIAGNOSIS — D631 Anemia in chronic kidney disease: Secondary | ICD-10-CM | POA: Diagnosis not present

## 2023-05-27 DIAGNOSIS — N2581 Secondary hyperparathyroidism of renal origin: Secondary | ICD-10-CM | POA: Diagnosis not present

## 2023-05-28 ENCOUNTER — Ambulatory Visit (INDEPENDENT_AMBULATORY_CARE_PROVIDER_SITE_OTHER): Payer: 59 | Admitting: Podiatry

## 2023-05-28 DIAGNOSIS — M722 Plantar fascial fibromatosis: Secondary | ICD-10-CM

## 2023-05-28 DIAGNOSIS — M7662 Achilles tendinitis, left leg: Secondary | ICD-10-CM | POA: Diagnosis not present

## 2023-05-28 DIAGNOSIS — M47812 Spondylosis without myelopathy or radiculopathy, cervical region: Secondary | ICD-10-CM | POA: Diagnosis not present

## 2023-05-28 NOTE — Progress Notes (Signed)
Subjective:  Patient ID: Ariel [name], female    DOB: 08/13/1976,  MRN: [id number]  Chief Complaint  Patient presents with   Follow-up    4 week follow up achilles tendinitis. Continues to have pain. No improvement. She is taking the meloxicam. Not wearing the night splint.     47 y.o. female presents for follow-up of left heel pain and Achilles tendon pain.  She says her heel is mostly the bothersome spot has pain when she steps down especially on hard surfaces and cannot go barefoot anymore.  She was given a steroid injection at last visit did not seem to manage icing and stretching exercises much difference she is still taking meloxicam.   Review of Systems: Negative except as noted in the HPI. Denies N/V/F/Ch.   Objective:  There were no vitals filed for this visit. There is no height or weight on file to calculate BMI. Constitutional Well developed. Well nourished.  Vascular Dorsalis pedis pulses palpable bilaterally. Posterior tibial pulses palpable bilaterally. Capillary refill normal to all digits.  No cyanosis or clubbing noted. Pedal hair growth normal.  Neurologic Normal speech. Oriented to person, place, and time. Epicritic sensation to light touch grossly present bilaterally.  Dermatologic Nails well groomed and normal in appearance. No open wounds. No skin lesions.  Orthopedic: Normal joint ROM without pain or crepitus bilaterally. No visible deformities. Tender to palpation at the calcaneal tuber left. No pain with calcaneal squeeze left. Ankle ROM diminished range with pain left. Silfverskiold Test: positive left. -Osseous prominence noted to the posterior aspect of the left calcaneus   Radiographs: Taken and reviewed. No acute fractures or dislocations. No evidence of stress fracture.  Plantar heel spur present. Posterior heel spur absent however there is Haglund's deformity present at the posterior aspect of the left superior posterior calcaneus.    Assessment:   1. Plantar fasciitis, left   2. Achilles tendinitis, left leg     Plan:  Patient was evaluated and treated and all questions answered.  Plantar Fasciitis, left -Not much improvement from prior however patient does not want to proceed with physical therapy or surgical intervention at this time as she has a lot going on currently.  Recommend continued effort on conservative therapy including rehab for stretching exercises and gel heel liner for shoes -Continue icing and stretching - Injection deferred at this visit per patient request - DME: Recommend gel heel insert for the patient for bursitis/.  Plantar fasciitis - Pharmacologic management: discontinue meloxicam as not seeming to make any improvement   # Achilles tendinitis of left lower extremity -Continue stretching icing and gel heel lifts  No follow-ups on file.

## 2023-05-29 DIAGNOSIS — Z992 Dependence on renal dialysis: Secondary | ICD-10-CM | POA: Diagnosis not present

## 2023-05-29 DIAGNOSIS — N186 End stage renal disease: Secondary | ICD-10-CM | POA: Diagnosis not present

## 2023-05-29 DIAGNOSIS — D631 Anemia in chronic kidney disease: Secondary | ICD-10-CM | POA: Diagnosis not present

## 2023-05-29 DIAGNOSIS — E876 Hypokalemia: Secondary | ICD-10-CM | POA: Diagnosis not present

## 2023-05-29 DIAGNOSIS — N2581 Secondary hyperparathyroidism of renal origin: Secondary | ICD-10-CM | POA: Diagnosis not present

## 2023-05-30 DIAGNOSIS — H903 Sensorineural hearing loss, bilateral: Secondary | ICD-10-CM | POA: Diagnosis not present

## 2023-05-30 DIAGNOSIS — H6121 Impacted cerumen, right ear: Secondary | ICD-10-CM | POA: Diagnosis not present

## 2023-05-31 DIAGNOSIS — E876 Hypokalemia: Secondary | ICD-10-CM | POA: Diagnosis not present

## 2023-05-31 DIAGNOSIS — D631 Anemia in chronic kidney disease: Secondary | ICD-10-CM | POA: Diagnosis not present

## 2023-05-31 DIAGNOSIS — N186 End stage renal disease: Secondary | ICD-10-CM | POA: Diagnosis not present

## 2023-05-31 DIAGNOSIS — N2581 Secondary hyperparathyroidism of renal origin: Secondary | ICD-10-CM | POA: Diagnosis not present

## 2023-05-31 DIAGNOSIS — Z992 Dependence on renal dialysis: Secondary | ICD-10-CM | POA: Diagnosis not present

## 2023-06-03 DIAGNOSIS — Z992 Dependence on renal dialysis: Secondary | ICD-10-CM | POA: Diagnosis not present

## 2023-06-03 DIAGNOSIS — N186 End stage renal disease: Secondary | ICD-10-CM | POA: Diagnosis not present

## 2023-06-03 DIAGNOSIS — T8610 Unspecified complication of kidney transplant: Secondary | ICD-10-CM | POA: Diagnosis not present

## 2023-06-04 DIAGNOSIS — E876 Hypokalemia: Secondary | ICD-10-CM | POA: Diagnosis not present

## 2023-06-04 DIAGNOSIS — N186 End stage renal disease: Secondary | ICD-10-CM | POA: Diagnosis not present

## 2023-06-04 DIAGNOSIS — Z23 Encounter for immunization: Secondary | ICD-10-CM | POA: Diagnosis not present

## 2023-06-04 DIAGNOSIS — N2581 Secondary hyperparathyroidism of renal origin: Secondary | ICD-10-CM | POA: Diagnosis not present

## 2023-06-04 DIAGNOSIS — Z992 Dependence on renal dialysis: Secondary | ICD-10-CM | POA: Diagnosis not present

## 2023-06-04 DIAGNOSIS — D631 Anemia in chronic kidney disease: Secondary | ICD-10-CM | POA: Diagnosis not present

## 2023-06-07 DIAGNOSIS — D631 Anemia in chronic kidney disease: Secondary | ICD-10-CM | POA: Diagnosis not present

## 2023-06-07 DIAGNOSIS — N186 End stage renal disease: Secondary | ICD-10-CM | POA: Diagnosis not present

## 2023-06-07 DIAGNOSIS — Z23 Encounter for immunization: Secondary | ICD-10-CM | POA: Diagnosis not present

## 2023-06-07 DIAGNOSIS — E876 Hypokalemia: Secondary | ICD-10-CM | POA: Diagnosis not present

## 2023-06-07 DIAGNOSIS — Z992 Dependence on renal dialysis: Secondary | ICD-10-CM | POA: Diagnosis not present

## 2023-06-07 DIAGNOSIS — N2581 Secondary hyperparathyroidism of renal origin: Secondary | ICD-10-CM | POA: Diagnosis not present

## 2023-06-10 DIAGNOSIS — N2581 Secondary hyperparathyroidism of renal origin: Secondary | ICD-10-CM | POA: Diagnosis not present

## 2023-06-10 DIAGNOSIS — N186 End stage renal disease: Secondary | ICD-10-CM | POA: Diagnosis not present

## 2023-06-10 DIAGNOSIS — Z23 Encounter for immunization: Secondary | ICD-10-CM | POA: Diagnosis not present

## 2023-06-10 DIAGNOSIS — D631 Anemia in chronic kidney disease: Secondary | ICD-10-CM | POA: Diagnosis not present

## 2023-06-10 DIAGNOSIS — Z992 Dependence on renal dialysis: Secondary | ICD-10-CM | POA: Diagnosis not present

## 2023-06-10 DIAGNOSIS — E876 Hypokalemia: Secondary | ICD-10-CM | POA: Diagnosis not present

## 2023-06-12 DIAGNOSIS — N2581 Secondary hyperparathyroidism of renal origin: Secondary | ICD-10-CM | POA: Diagnosis not present

## 2023-06-12 DIAGNOSIS — E876 Hypokalemia: Secondary | ICD-10-CM | POA: Diagnosis not present

## 2023-06-12 DIAGNOSIS — Z992 Dependence on renal dialysis: Secondary | ICD-10-CM | POA: Diagnosis not present

## 2023-06-12 DIAGNOSIS — Z23 Encounter for immunization: Secondary | ICD-10-CM | POA: Diagnosis not present

## 2023-06-12 DIAGNOSIS — D631 Anemia in chronic kidney disease: Secondary | ICD-10-CM | POA: Diagnosis not present

## 2023-06-12 DIAGNOSIS — N186 End stage renal disease: Secondary | ICD-10-CM | POA: Diagnosis not present

## 2023-06-13 DIAGNOSIS — Z1231 Encounter for screening mammogram for malignant neoplasm of breast: Secondary | ICD-10-CM | POA: Diagnosis not present

## 2023-06-14 DIAGNOSIS — Z23 Encounter for immunization: Secondary | ICD-10-CM | POA: Diagnosis not present

## 2023-06-14 DIAGNOSIS — N2581 Secondary hyperparathyroidism of renal origin: Secondary | ICD-10-CM | POA: Diagnosis not present

## 2023-06-14 DIAGNOSIS — E876 Hypokalemia: Secondary | ICD-10-CM | POA: Diagnosis not present

## 2023-06-14 DIAGNOSIS — D631 Anemia in chronic kidney disease: Secondary | ICD-10-CM | POA: Diagnosis not present

## 2023-06-14 DIAGNOSIS — N186 End stage renal disease: Secondary | ICD-10-CM | POA: Diagnosis not present

## 2023-06-14 DIAGNOSIS — Z992 Dependence on renal dialysis: Secondary | ICD-10-CM | POA: Diagnosis not present

## 2023-06-17 DIAGNOSIS — Z23 Encounter for immunization: Secondary | ICD-10-CM | POA: Diagnosis not present

## 2023-06-17 DIAGNOSIS — E876 Hypokalemia: Secondary | ICD-10-CM | POA: Diagnosis not present

## 2023-06-17 DIAGNOSIS — N2581 Secondary hyperparathyroidism of renal origin: Secondary | ICD-10-CM | POA: Diagnosis not present

## 2023-06-17 DIAGNOSIS — D631 Anemia in chronic kidney disease: Secondary | ICD-10-CM | POA: Diagnosis not present

## 2023-06-17 DIAGNOSIS — N186 End stage renal disease: Secondary | ICD-10-CM | POA: Diagnosis not present

## 2023-06-17 DIAGNOSIS — Z992 Dependence on renal dialysis: Secondary | ICD-10-CM | POA: Diagnosis not present

## 2023-06-19 DIAGNOSIS — N186 End stage renal disease: Secondary | ICD-10-CM | POA: Diagnosis not present

## 2023-06-19 DIAGNOSIS — N2581 Secondary hyperparathyroidism of renal origin: Secondary | ICD-10-CM | POA: Diagnosis not present

## 2023-06-19 DIAGNOSIS — D631 Anemia in chronic kidney disease: Secondary | ICD-10-CM | POA: Diagnosis not present

## 2023-06-19 DIAGNOSIS — E876 Hypokalemia: Secondary | ICD-10-CM | POA: Diagnosis not present

## 2023-06-19 DIAGNOSIS — Z992 Dependence on renal dialysis: Secondary | ICD-10-CM | POA: Diagnosis not present

## 2023-06-19 DIAGNOSIS — Z23 Encounter for immunization: Secondary | ICD-10-CM | POA: Diagnosis not present

## 2023-06-21 DIAGNOSIS — N186 End stage renal disease: Secondary | ICD-10-CM | POA: Diagnosis not present

## 2023-06-21 DIAGNOSIS — M47812 Spondylosis without myelopathy or radiculopathy, cervical region: Secondary | ICD-10-CM | POA: Diagnosis not present

## 2023-06-21 DIAGNOSIS — N2581 Secondary hyperparathyroidism of renal origin: Secondary | ICD-10-CM | POA: Diagnosis not present

## 2023-06-21 DIAGNOSIS — D631 Anemia in chronic kidney disease: Secondary | ICD-10-CM | POA: Diagnosis not present

## 2023-06-21 DIAGNOSIS — M5412 Radiculopathy, cervical region: Secondary | ICD-10-CM | POA: Diagnosis not present

## 2023-06-21 DIAGNOSIS — E876 Hypokalemia: Secondary | ICD-10-CM | POA: Diagnosis not present

## 2023-06-21 DIAGNOSIS — Z23 Encounter for immunization: Secondary | ICD-10-CM | POA: Diagnosis not present

## 2023-06-21 DIAGNOSIS — Z992 Dependence on renal dialysis: Secondary | ICD-10-CM | POA: Diagnosis not present

## 2023-06-24 DIAGNOSIS — E876 Hypokalemia: Secondary | ICD-10-CM | POA: Diagnosis not present

## 2023-06-24 DIAGNOSIS — Z23 Encounter for immunization: Secondary | ICD-10-CM | POA: Diagnosis not present

## 2023-06-24 DIAGNOSIS — N186 End stage renal disease: Secondary | ICD-10-CM | POA: Diagnosis not present

## 2023-06-24 DIAGNOSIS — Z992 Dependence on renal dialysis: Secondary | ICD-10-CM | POA: Diagnosis not present

## 2023-06-24 DIAGNOSIS — N2581 Secondary hyperparathyroidism of renal origin: Secondary | ICD-10-CM | POA: Diagnosis not present

## 2023-06-24 DIAGNOSIS — D631 Anemia in chronic kidney disease: Secondary | ICD-10-CM | POA: Diagnosis not present

## 2023-06-26 DIAGNOSIS — N186 End stage renal disease: Secondary | ICD-10-CM | POA: Diagnosis not present

## 2023-06-26 DIAGNOSIS — D631 Anemia in chronic kidney disease: Secondary | ICD-10-CM | POA: Diagnosis not present

## 2023-06-26 DIAGNOSIS — R5383 Other fatigue: Secondary | ICD-10-CM | POA: Diagnosis not present

## 2023-06-26 DIAGNOSIS — N185 Chronic kidney disease, stage 5: Secondary | ICD-10-CM | POA: Diagnosis not present

## 2023-06-26 DIAGNOSIS — Z992 Dependence on renal dialysis: Secondary | ICD-10-CM | POA: Diagnosis not present

## 2023-06-26 DIAGNOSIS — N2581 Secondary hyperparathyroidism of renal origin: Secondary | ICD-10-CM | POA: Diagnosis not present

## 2023-06-26 DIAGNOSIS — E876 Hypokalemia: Secondary | ICD-10-CM | POA: Diagnosis not present

## 2023-06-26 DIAGNOSIS — R0683 Snoring: Secondary | ICD-10-CM | POA: Diagnosis not present

## 2023-06-26 DIAGNOSIS — Z23 Encounter for immunization: Secondary | ICD-10-CM | POA: Diagnosis not present

## 2023-06-28 DIAGNOSIS — D631 Anemia in chronic kidney disease: Secondary | ICD-10-CM | POA: Diagnosis not present

## 2023-06-28 DIAGNOSIS — E876 Hypokalemia: Secondary | ICD-10-CM | POA: Diagnosis not present

## 2023-06-28 DIAGNOSIS — N2581 Secondary hyperparathyroidism of renal origin: Secondary | ICD-10-CM | POA: Diagnosis not present

## 2023-06-28 DIAGNOSIS — N186 End stage renal disease: Secondary | ICD-10-CM | POA: Diagnosis not present

## 2023-06-28 DIAGNOSIS — Z992 Dependence on renal dialysis: Secondary | ICD-10-CM | POA: Diagnosis not present

## 2023-06-28 DIAGNOSIS — Z23 Encounter for immunization: Secondary | ICD-10-CM | POA: Diagnosis not present

## 2023-07-03 DIAGNOSIS — D631 Anemia in chronic kidney disease: Secondary | ICD-10-CM | POA: Diagnosis not present

## 2023-07-03 DIAGNOSIS — M25562 Pain in left knee: Secondary | ICD-10-CM | POA: Diagnosis not present

## 2023-07-03 DIAGNOSIS — Z992 Dependence on renal dialysis: Secondary | ICD-10-CM | POA: Diagnosis not present

## 2023-07-03 DIAGNOSIS — E876 Hypokalemia: Secondary | ICD-10-CM | POA: Diagnosis not present

## 2023-07-03 DIAGNOSIS — N186 End stage renal disease: Secondary | ICD-10-CM | POA: Diagnosis not present

## 2023-07-03 DIAGNOSIS — Z23 Encounter for immunization: Secondary | ICD-10-CM | POA: Diagnosis not present

## 2023-07-03 DIAGNOSIS — N2581 Secondary hyperparathyroidism of renal origin: Secondary | ICD-10-CM | POA: Diagnosis not present

## 2023-07-04 DIAGNOSIS — N186 End stage renal disease: Secondary | ICD-10-CM | POA: Diagnosis not present

## 2023-07-04 DIAGNOSIS — Z992 Dependence on renal dialysis: Secondary | ICD-10-CM | POA: Diagnosis not present

## 2023-07-04 DIAGNOSIS — T8610 Unspecified complication of kidney transplant: Secondary | ICD-10-CM | POA: Diagnosis not present

## 2023-07-05 DIAGNOSIS — D631 Anemia in chronic kidney disease: Secondary | ICD-10-CM | POA: Diagnosis not present

## 2023-07-05 DIAGNOSIS — N2581 Secondary hyperparathyroidism of renal origin: Secondary | ICD-10-CM | POA: Diagnosis not present

## 2023-07-05 DIAGNOSIS — E876 Hypokalemia: Secondary | ICD-10-CM | POA: Diagnosis not present

## 2023-07-05 DIAGNOSIS — Z992 Dependence on renal dialysis: Secondary | ICD-10-CM | POA: Diagnosis not present

## 2023-07-05 DIAGNOSIS — N186 End stage renal disease: Secondary | ICD-10-CM | POA: Diagnosis not present

## 2023-07-08 DIAGNOSIS — N186 End stage renal disease: Secondary | ICD-10-CM | POA: Diagnosis not present

## 2023-07-08 DIAGNOSIS — N2581 Secondary hyperparathyroidism of renal origin: Secondary | ICD-10-CM | POA: Diagnosis not present

## 2023-07-08 DIAGNOSIS — E876 Hypokalemia: Secondary | ICD-10-CM | POA: Diagnosis not present

## 2023-07-08 DIAGNOSIS — Z992 Dependence on renal dialysis: Secondary | ICD-10-CM | POA: Diagnosis not present

## 2023-07-08 DIAGNOSIS — D631 Anemia in chronic kidney disease: Secondary | ICD-10-CM | POA: Diagnosis not present

## 2023-07-10 DIAGNOSIS — Z992 Dependence on renal dialysis: Secondary | ICD-10-CM | POA: Diagnosis not present

## 2023-07-10 DIAGNOSIS — N186 End stage renal disease: Secondary | ICD-10-CM | POA: Diagnosis not present

## 2023-07-10 DIAGNOSIS — D631 Anemia in chronic kidney disease: Secondary | ICD-10-CM | POA: Diagnosis not present

## 2023-07-10 DIAGNOSIS — E876 Hypokalemia: Secondary | ICD-10-CM | POA: Diagnosis not present

## 2023-07-10 DIAGNOSIS — N2581 Secondary hyperparathyroidism of renal origin: Secondary | ICD-10-CM | POA: Diagnosis not present

## 2023-07-12 DIAGNOSIS — N186 End stage renal disease: Secondary | ICD-10-CM | POA: Diagnosis not present

## 2023-07-12 DIAGNOSIS — N2581 Secondary hyperparathyroidism of renal origin: Secondary | ICD-10-CM | POA: Diagnosis not present

## 2023-07-12 DIAGNOSIS — D631 Anemia in chronic kidney disease: Secondary | ICD-10-CM | POA: Diagnosis not present

## 2023-07-12 DIAGNOSIS — Z992 Dependence on renal dialysis: Secondary | ICD-10-CM | POA: Diagnosis not present

## 2023-07-12 DIAGNOSIS — E876 Hypokalemia: Secondary | ICD-10-CM | POA: Diagnosis not present

## 2023-07-13 DIAGNOSIS — M25531 Pain in right wrist: Secondary | ICD-10-CM | POA: Diagnosis not present

## 2023-07-13 DIAGNOSIS — M10031 Idiopathic gout, right wrist: Secondary | ICD-10-CM | POA: Diagnosis not present

## 2023-07-15 DIAGNOSIS — N186 End stage renal disease: Secondary | ICD-10-CM | POA: Diagnosis not present

## 2023-07-15 DIAGNOSIS — Z992 Dependence on renal dialysis: Secondary | ICD-10-CM | POA: Diagnosis not present

## 2023-07-15 DIAGNOSIS — D631 Anemia in chronic kidney disease: Secondary | ICD-10-CM | POA: Diagnosis not present

## 2023-07-15 DIAGNOSIS — N2581 Secondary hyperparathyroidism of renal origin: Secondary | ICD-10-CM | POA: Diagnosis not present

## 2023-07-15 DIAGNOSIS — E876 Hypokalemia: Secondary | ICD-10-CM | POA: Diagnosis not present

## 2023-07-16 DIAGNOSIS — N281 Cyst of kidney, acquired: Secondary | ICD-10-CM | POA: Diagnosis not present

## 2023-07-16 DIAGNOSIS — N186 End stage renal disease: Secondary | ICD-10-CM | POA: Diagnosis not present

## 2023-07-16 DIAGNOSIS — Z01818 Encounter for other preprocedural examination: Secondary | ICD-10-CM | POA: Diagnosis not present

## 2023-07-17 DIAGNOSIS — N2581 Secondary hyperparathyroidism of renal origin: Secondary | ICD-10-CM | POA: Diagnosis not present

## 2023-07-17 DIAGNOSIS — E876 Hypokalemia: Secondary | ICD-10-CM | POA: Diagnosis not present

## 2023-07-17 DIAGNOSIS — N186 End stage renal disease: Secondary | ICD-10-CM | POA: Diagnosis not present

## 2023-07-17 DIAGNOSIS — D631 Anemia in chronic kidney disease: Secondary | ICD-10-CM | POA: Diagnosis not present

## 2023-07-17 DIAGNOSIS — Z992 Dependence on renal dialysis: Secondary | ICD-10-CM | POA: Diagnosis not present

## 2023-07-18 DIAGNOSIS — J208 Acute bronchitis due to other specified organisms: Secondary | ICD-10-CM | POA: Diagnosis not present

## 2023-07-19 DIAGNOSIS — Z992 Dependence on renal dialysis: Secondary | ICD-10-CM | POA: Diagnosis not present

## 2023-07-19 DIAGNOSIS — E876 Hypokalemia: Secondary | ICD-10-CM | POA: Diagnosis not present

## 2023-07-19 DIAGNOSIS — N2581 Secondary hyperparathyroidism of renal origin: Secondary | ICD-10-CM | POA: Diagnosis not present

## 2023-07-19 DIAGNOSIS — N186 End stage renal disease: Secondary | ICD-10-CM | POA: Diagnosis not present

## 2023-07-19 DIAGNOSIS — D631 Anemia in chronic kidney disease: Secondary | ICD-10-CM | POA: Diagnosis not present

## 2023-07-24 DIAGNOSIS — N186 End stage renal disease: Secondary | ICD-10-CM | POA: Diagnosis not present

## 2023-07-24 DIAGNOSIS — N2581 Secondary hyperparathyroidism of renal origin: Secondary | ICD-10-CM | POA: Diagnosis not present

## 2023-07-24 DIAGNOSIS — D631 Anemia in chronic kidney disease: Secondary | ICD-10-CM | POA: Diagnosis not present

## 2023-07-24 DIAGNOSIS — E876 Hypokalemia: Secondary | ICD-10-CM | POA: Diagnosis not present

## 2023-07-24 DIAGNOSIS — Z992 Dependence on renal dialysis: Secondary | ICD-10-CM | POA: Diagnosis not present

## 2023-07-26 DIAGNOSIS — N2581 Secondary hyperparathyroidism of renal origin: Secondary | ICD-10-CM | POA: Diagnosis not present

## 2023-07-26 DIAGNOSIS — Z992 Dependence on renal dialysis: Secondary | ICD-10-CM | POA: Diagnosis not present

## 2023-07-26 DIAGNOSIS — N186 End stage renal disease: Secondary | ICD-10-CM | POA: Diagnosis not present

## 2023-07-26 DIAGNOSIS — E876 Hypokalemia: Secondary | ICD-10-CM | POA: Diagnosis not present

## 2023-07-26 DIAGNOSIS — D631 Anemia in chronic kidney disease: Secondary | ICD-10-CM | POA: Diagnosis not present

## 2023-07-29 DIAGNOSIS — E876 Hypokalemia: Secondary | ICD-10-CM | POA: Diagnosis not present

## 2023-07-29 DIAGNOSIS — N2581 Secondary hyperparathyroidism of renal origin: Secondary | ICD-10-CM | POA: Diagnosis not present

## 2023-07-29 DIAGNOSIS — Z992 Dependence on renal dialysis: Secondary | ICD-10-CM | POA: Diagnosis not present

## 2023-07-29 DIAGNOSIS — D631 Anemia in chronic kidney disease: Secondary | ICD-10-CM | POA: Diagnosis not present

## 2023-07-29 DIAGNOSIS — N186 End stage renal disease: Secondary | ICD-10-CM | POA: Diagnosis not present

## 2023-07-30 DIAGNOSIS — M1712 Unilateral primary osteoarthritis, left knee: Secondary | ICD-10-CM | POA: Diagnosis not present

## 2023-07-31 DIAGNOSIS — D631 Anemia in chronic kidney disease: Secondary | ICD-10-CM | POA: Diagnosis not present

## 2023-07-31 DIAGNOSIS — E876 Hypokalemia: Secondary | ICD-10-CM | POA: Diagnosis not present

## 2023-07-31 DIAGNOSIS — N186 End stage renal disease: Secondary | ICD-10-CM | POA: Diagnosis not present

## 2023-07-31 DIAGNOSIS — N2581 Secondary hyperparathyroidism of renal origin: Secondary | ICD-10-CM | POA: Diagnosis not present

## 2023-07-31 DIAGNOSIS — Z992 Dependence on renal dialysis: Secondary | ICD-10-CM | POA: Diagnosis not present

## 2023-08-02 DIAGNOSIS — N2581 Secondary hyperparathyroidism of renal origin: Secondary | ICD-10-CM | POA: Diagnosis not present

## 2023-08-02 DIAGNOSIS — Z992 Dependence on renal dialysis: Secondary | ICD-10-CM | POA: Diagnosis not present

## 2023-08-02 DIAGNOSIS — E876 Hypokalemia: Secondary | ICD-10-CM | POA: Diagnosis not present

## 2023-08-02 DIAGNOSIS — D631 Anemia in chronic kidney disease: Secondary | ICD-10-CM | POA: Diagnosis not present

## 2023-08-02 DIAGNOSIS — N186 End stage renal disease: Secondary | ICD-10-CM | POA: Diagnosis not present

## 2023-08-03 DIAGNOSIS — T8610 Unspecified complication of kidney transplant: Secondary | ICD-10-CM | POA: Diagnosis not present

## 2023-08-03 DIAGNOSIS — Z992 Dependence on renal dialysis: Secondary | ICD-10-CM | POA: Diagnosis not present

## 2023-08-03 DIAGNOSIS — N186 End stage renal disease: Secondary | ICD-10-CM | POA: Diagnosis not present

## 2023-08-05 DIAGNOSIS — D631 Anemia in chronic kidney disease: Secondary | ICD-10-CM | POA: Diagnosis not present

## 2023-08-05 DIAGNOSIS — Z992 Dependence on renal dialysis: Secondary | ICD-10-CM | POA: Diagnosis not present

## 2023-08-05 DIAGNOSIS — N186 End stage renal disease: Secondary | ICD-10-CM | POA: Diagnosis not present

## 2023-08-05 DIAGNOSIS — E876 Hypokalemia: Secondary | ICD-10-CM | POA: Diagnosis not present

## 2023-08-05 DIAGNOSIS — N2581 Secondary hyperparathyroidism of renal origin: Secondary | ICD-10-CM | POA: Diagnosis not present

## 2023-08-07 DIAGNOSIS — N186 End stage renal disease: Secondary | ICD-10-CM | POA: Diagnosis not present

## 2023-08-07 DIAGNOSIS — G4700 Insomnia, unspecified: Secondary | ICD-10-CM | POA: Diagnosis not present

## 2023-08-07 DIAGNOSIS — E876 Hypokalemia: Secondary | ICD-10-CM | POA: Diagnosis not present

## 2023-08-07 DIAGNOSIS — M542 Cervicalgia: Secondary | ICD-10-CM | POA: Diagnosis not present

## 2023-08-07 DIAGNOSIS — Z992 Dependence on renal dialysis: Secondary | ICD-10-CM | POA: Diagnosis not present

## 2023-08-07 DIAGNOSIS — D631 Anemia in chronic kidney disease: Secondary | ICD-10-CM | POA: Diagnosis not present

## 2023-08-07 DIAGNOSIS — N2581 Secondary hyperparathyroidism of renal origin: Secondary | ICD-10-CM | POA: Diagnosis not present

## 2023-08-07 DIAGNOSIS — N185 Chronic kidney disease, stage 5: Secondary | ICD-10-CM | POA: Diagnosis not present

## 2023-08-09 DIAGNOSIS — Z992 Dependence on renal dialysis: Secondary | ICD-10-CM | POA: Diagnosis not present

## 2023-08-09 DIAGNOSIS — N2581 Secondary hyperparathyroidism of renal origin: Secondary | ICD-10-CM | POA: Diagnosis not present

## 2023-08-09 DIAGNOSIS — D631 Anemia in chronic kidney disease: Secondary | ICD-10-CM | POA: Diagnosis not present

## 2023-08-09 DIAGNOSIS — E876 Hypokalemia: Secondary | ICD-10-CM | POA: Diagnosis not present

## 2023-08-09 DIAGNOSIS — N186 End stage renal disease: Secondary | ICD-10-CM | POA: Diagnosis not present

## 2023-08-12 DIAGNOSIS — D631 Anemia in chronic kidney disease: Secondary | ICD-10-CM | POA: Diagnosis not present

## 2023-08-12 DIAGNOSIS — Z992 Dependence on renal dialysis: Secondary | ICD-10-CM | POA: Diagnosis not present

## 2023-08-12 DIAGNOSIS — N186 End stage renal disease: Secondary | ICD-10-CM | POA: Diagnosis not present

## 2023-08-12 DIAGNOSIS — E876 Hypokalemia: Secondary | ICD-10-CM | POA: Diagnosis not present

## 2023-08-12 DIAGNOSIS — N2581 Secondary hyperparathyroidism of renal origin: Secondary | ICD-10-CM | POA: Diagnosis not present

## 2023-08-15 DIAGNOSIS — M47812 Spondylosis without myelopathy or radiculopathy, cervical region: Secondary | ICD-10-CM | POA: Diagnosis not present

## 2023-08-16 DIAGNOSIS — D631 Anemia in chronic kidney disease: Secondary | ICD-10-CM | POA: Diagnosis not present

## 2023-08-16 DIAGNOSIS — E876 Hypokalemia: Secondary | ICD-10-CM | POA: Diagnosis not present

## 2023-08-16 DIAGNOSIS — Z992 Dependence on renal dialysis: Secondary | ICD-10-CM | POA: Diagnosis not present

## 2023-08-16 DIAGNOSIS — N2581 Secondary hyperparathyroidism of renal origin: Secondary | ICD-10-CM | POA: Diagnosis not present

## 2023-08-16 DIAGNOSIS — N186 End stage renal disease: Secondary | ICD-10-CM | POA: Diagnosis not present

## 2023-08-16 DIAGNOSIS — M1712 Unilateral primary osteoarthritis, left knee: Secondary | ICD-10-CM | POA: Diagnosis not present

## 2023-08-19 DIAGNOSIS — E876 Hypokalemia: Secondary | ICD-10-CM | POA: Diagnosis not present

## 2023-08-19 DIAGNOSIS — Z992 Dependence on renal dialysis: Secondary | ICD-10-CM | POA: Diagnosis not present

## 2023-08-19 DIAGNOSIS — N186 End stage renal disease: Secondary | ICD-10-CM | POA: Diagnosis not present

## 2023-08-19 DIAGNOSIS — N2581 Secondary hyperparathyroidism of renal origin: Secondary | ICD-10-CM | POA: Diagnosis not present

## 2023-08-19 DIAGNOSIS — D631 Anemia in chronic kidney disease: Secondary | ICD-10-CM | POA: Diagnosis not present

## 2023-08-21 DIAGNOSIS — E876 Hypokalemia: Secondary | ICD-10-CM | POA: Diagnosis not present

## 2023-08-21 DIAGNOSIS — D631 Anemia in chronic kidney disease: Secondary | ICD-10-CM | POA: Diagnosis not present

## 2023-08-21 DIAGNOSIS — N2581 Secondary hyperparathyroidism of renal origin: Secondary | ICD-10-CM | POA: Diagnosis not present

## 2023-08-21 DIAGNOSIS — Z992 Dependence on renal dialysis: Secondary | ICD-10-CM | POA: Diagnosis not present

## 2023-08-21 DIAGNOSIS — N186 End stage renal disease: Secondary | ICD-10-CM | POA: Diagnosis not present

## 2023-08-22 DIAGNOSIS — E559 Vitamin D deficiency, unspecified: Secondary | ICD-10-CM | POA: Diagnosis not present

## 2023-08-22 DIAGNOSIS — Z79899 Other long term (current) drug therapy: Secondary | ICD-10-CM | POA: Diagnosis not present

## 2023-08-22 DIAGNOSIS — Z01818 Encounter for other preprocedural examination: Secondary | ICD-10-CM | POA: Diagnosis not present

## 2023-08-22 DIAGNOSIS — M79609 Pain in unspecified limb: Secondary | ICD-10-CM | POA: Diagnosis not present

## 2023-08-23 DIAGNOSIS — Z01818 Encounter for other preprocedural examination: Secondary | ICD-10-CM | POA: Diagnosis not present

## 2023-08-23 DIAGNOSIS — M1712 Unilateral primary osteoarthritis, left knee: Secondary | ICD-10-CM | POA: Diagnosis not present

## 2023-08-23 DIAGNOSIS — E876 Hypokalemia: Secondary | ICD-10-CM | POA: Diagnosis not present

## 2023-08-23 DIAGNOSIS — N186 End stage renal disease: Secondary | ICD-10-CM | POA: Diagnosis not present

## 2023-08-23 DIAGNOSIS — D631 Anemia in chronic kidney disease: Secondary | ICD-10-CM | POA: Diagnosis not present

## 2023-08-23 DIAGNOSIS — N2581 Secondary hyperparathyroidism of renal origin: Secondary | ICD-10-CM | POA: Diagnosis not present

## 2023-08-23 DIAGNOSIS — Z992 Dependence on renal dialysis: Secondary | ICD-10-CM | POA: Diagnosis not present

## 2023-08-25 DIAGNOSIS — Z992 Dependence on renal dialysis: Secondary | ICD-10-CM | POA: Diagnosis not present

## 2023-08-25 DIAGNOSIS — E876 Hypokalemia: Secondary | ICD-10-CM | POA: Diagnosis not present

## 2023-08-25 DIAGNOSIS — N2581 Secondary hyperparathyroidism of renal origin: Secondary | ICD-10-CM | POA: Diagnosis not present

## 2023-08-25 DIAGNOSIS — N186 End stage renal disease: Secondary | ICD-10-CM | POA: Diagnosis not present

## 2023-08-25 DIAGNOSIS — D631 Anemia in chronic kidney disease: Secondary | ICD-10-CM | POA: Diagnosis not present

## 2023-08-27 DIAGNOSIS — D631 Anemia in chronic kidney disease: Secondary | ICD-10-CM | POA: Diagnosis not present

## 2023-08-27 DIAGNOSIS — N2581 Secondary hyperparathyroidism of renal origin: Secondary | ICD-10-CM | POA: Diagnosis not present

## 2023-08-27 DIAGNOSIS — E876 Hypokalemia: Secondary | ICD-10-CM | POA: Diagnosis not present

## 2023-08-27 DIAGNOSIS — Z992 Dependence on renal dialysis: Secondary | ICD-10-CM | POA: Diagnosis not present

## 2023-08-27 DIAGNOSIS — N186 End stage renal disease: Secondary | ICD-10-CM | POA: Diagnosis not present

## 2023-08-29 DIAGNOSIS — M1711 Unilateral primary osteoarthritis, right knee: Secondary | ICD-10-CM | POA: Diagnosis not present

## 2023-08-30 DIAGNOSIS — N2581 Secondary hyperparathyroidism of renal origin: Secondary | ICD-10-CM | POA: Diagnosis not present

## 2023-08-30 DIAGNOSIS — N186 End stage renal disease: Secondary | ICD-10-CM | POA: Diagnosis not present

## 2023-08-30 DIAGNOSIS — Z992 Dependence on renal dialysis: Secondary | ICD-10-CM | POA: Diagnosis not present

## 2023-08-30 DIAGNOSIS — E876 Hypokalemia: Secondary | ICD-10-CM | POA: Diagnosis not present

## 2023-08-30 DIAGNOSIS — D631 Anemia in chronic kidney disease: Secondary | ICD-10-CM | POA: Diagnosis not present

## 2023-09-01 DIAGNOSIS — Z992 Dependence on renal dialysis: Secondary | ICD-10-CM | POA: Diagnosis not present

## 2023-09-01 DIAGNOSIS — N186 End stage renal disease: Secondary | ICD-10-CM | POA: Diagnosis not present

## 2023-09-01 DIAGNOSIS — E876 Hypokalemia: Secondary | ICD-10-CM | POA: Diagnosis not present

## 2023-09-01 DIAGNOSIS — D631 Anemia in chronic kidney disease: Secondary | ICD-10-CM | POA: Diagnosis not present

## 2023-09-01 DIAGNOSIS — N2581 Secondary hyperparathyroidism of renal origin: Secondary | ICD-10-CM | POA: Diagnosis not present

## 2023-09-03 DIAGNOSIS — D631 Anemia in chronic kidney disease: Secondary | ICD-10-CM | POA: Diagnosis not present

## 2023-09-03 DIAGNOSIS — E876 Hypokalemia: Secondary | ICD-10-CM | POA: Diagnosis not present

## 2023-09-03 DIAGNOSIS — Z992 Dependence on renal dialysis: Secondary | ICD-10-CM | POA: Diagnosis not present

## 2023-09-03 DIAGNOSIS — N186 End stage renal disease: Secondary | ICD-10-CM | POA: Diagnosis not present

## 2023-09-03 DIAGNOSIS — T8610 Unspecified complication of kidney transplant: Secondary | ICD-10-CM | POA: Diagnosis not present

## 2023-09-03 DIAGNOSIS — N2581 Secondary hyperparathyroidism of renal origin: Secondary | ICD-10-CM | POA: Diagnosis not present

## 2023-09-06 DIAGNOSIS — R0981 Nasal congestion: Secondary | ICD-10-CM | POA: Diagnosis not present

## 2023-09-06 DIAGNOSIS — R059 Cough, unspecified: Secondary | ICD-10-CM | POA: Diagnosis not present

## 2023-09-06 DIAGNOSIS — J040 Acute laryngitis: Secondary | ICD-10-CM | POA: Diagnosis not present

## 2023-09-09 DIAGNOSIS — N2581 Secondary hyperparathyroidism of renal origin: Secondary | ICD-10-CM | POA: Diagnosis not present

## 2023-09-09 DIAGNOSIS — N186 End stage renal disease: Secondary | ICD-10-CM | POA: Diagnosis not present

## 2023-09-09 DIAGNOSIS — Z992 Dependence on renal dialysis: Secondary | ICD-10-CM | POA: Diagnosis not present

## 2023-09-09 DIAGNOSIS — D631 Anemia in chronic kidney disease: Secondary | ICD-10-CM | POA: Diagnosis not present

## 2023-09-09 DIAGNOSIS — E876 Hypokalemia: Secondary | ICD-10-CM | POA: Diagnosis not present

## 2023-09-10 ENCOUNTER — Telehealth: Payer: Self-pay

## 2023-09-10 ENCOUNTER — Telehealth: Payer: Self-pay | Admitting: *Deleted

## 2023-09-10 DIAGNOSIS — R079 Chest pain, unspecified: Secondary | ICD-10-CM | POA: Diagnosis not present

## 2023-09-10 DIAGNOSIS — Z940 Kidney transplant status: Secondary | ICD-10-CM | POA: Diagnosis not present

## 2023-09-10 DIAGNOSIS — J811 Chronic pulmonary edema: Secondary | ICD-10-CM | POA: Diagnosis not present

## 2023-09-10 DIAGNOSIS — I10 Essential (primary) hypertension: Secondary | ICD-10-CM | POA: Diagnosis not present

## 2023-09-10 DIAGNOSIS — R918 Other nonspecific abnormal finding of lung field: Secondary | ICD-10-CM | POA: Diagnosis not present

## 2023-09-10 NOTE — Telephone Encounter (Signed)
   Pre-operative Risk Assessment    Patient Name: Ariel [name]  DOB: 1975-11-25 MRN: [id number]      Request for Surgical Clearance    Procedure:   Cervical 5-6, 6-7 Anterior Cervical Discectomy and Interbody Fusion and other procedures as indicated  Date of Surgery:  Clearance TBD                                 Surgeon:  Arley Dark, MD Surgeon's Group or Practice Name:  [facility] Phone number:  [phone] Fax number:  [phone]   Type of Clearance Requested:   - Medical    Type of Anesthesia:  General    Additional requests/questions:    SignedArloa Donovan Dines   09/10/2023, 5:20 PM

## 2023-09-10 NOTE — Telephone Encounter (Signed)
 error

## 2023-09-11 ENCOUNTER — Encounter: Payer: Self-pay | Admitting: Cardiology

## 2023-09-11 ENCOUNTER — Telehealth: Payer: Self-pay | Admitting: *Deleted

## 2023-09-11 ENCOUNTER — Encounter: Payer: Self-pay | Admitting: *Deleted

## 2023-09-11 DIAGNOSIS — D631 Anemia in chronic kidney disease: Secondary | ICD-10-CM | POA: Diagnosis not present

## 2023-09-11 DIAGNOSIS — Z992 Dependence on renal dialysis: Secondary | ICD-10-CM | POA: Diagnosis not present

## 2023-09-11 DIAGNOSIS — E876 Hypokalemia: Secondary | ICD-10-CM | POA: Diagnosis not present

## 2023-09-11 DIAGNOSIS — N2581 Secondary hyperparathyroidism of renal origin: Secondary | ICD-10-CM | POA: Diagnosis not present

## 2023-09-11 DIAGNOSIS — N186 End stage renal disease: Secondary | ICD-10-CM | POA: Diagnosis not present

## 2023-09-11 NOTE — Telephone Encounter (Signed)
1st attempt to reach pt regarding surgical clearance and the need for an IN OFFICE appointment.  Left pt a detailed message to call back and get that scheduled.

## 2023-09-11 NOTE — Telephone Encounter (Signed)
   Name: Ariel [name]  DOB: 1975/11/10  MRN: [id number]  Primary Cardiologist: Dr. Edwyna  Chart reviewed as part of pre-operative protocol coverage. Because of Ariel [name]'s past medical history and time since last visit, she will require a follow-up in-office visit in order to better assess preoperative cardiovascular risk. At last OV 11/2022 Dr. Edwyna was very concerned for angina. Cath was performed without significant findings but vasospasm could not be excluded due to nationwide shortage. She also has hx ESRD on HD. She was recommended to f/u 08/2023 but do not see this yet occurred. Therefore recommend formal OV to review cardiac clearance.  Pre-op  covering staff: - Please schedule appointment and call patient to inform them. If patient already had an upcoming appointment within acceptable timeframe, please add pre-op  clearance to the appointment notes so provider is aware. - Please contact requesting surgeon's office via preferred method (i.e, phone, fax) to inform them of need for appointment prior to surgery.  No antiplatelets listed on MAR.  [name] N Mavery Milling, PA-C  09/11/2023, 11:22 AM

## 2023-09-11 NOTE — Telephone Encounter (Signed)
 Error

## 2023-09-13 DIAGNOSIS — N2581 Secondary hyperparathyroidism of renal origin: Secondary | ICD-10-CM | POA: Diagnosis not present

## 2023-09-13 DIAGNOSIS — E876 Hypokalemia: Secondary | ICD-10-CM | POA: Diagnosis not present

## 2023-09-13 DIAGNOSIS — Z992 Dependence on renal dialysis: Secondary | ICD-10-CM | POA: Diagnosis not present

## 2023-09-13 DIAGNOSIS — D631 Anemia in chronic kidney disease: Secondary | ICD-10-CM | POA: Diagnosis not present

## 2023-09-13 DIAGNOSIS — N186 End stage renal disease: Secondary | ICD-10-CM | POA: Diagnosis not present

## 2023-09-16 DIAGNOSIS — E876 Hypokalemia: Secondary | ICD-10-CM | POA: Diagnosis not present

## 2023-09-16 DIAGNOSIS — N2581 Secondary hyperparathyroidism of renal origin: Secondary | ICD-10-CM | POA: Diagnosis not present

## 2023-09-16 DIAGNOSIS — Z992 Dependence on renal dialysis: Secondary | ICD-10-CM | POA: Diagnosis not present

## 2023-09-16 DIAGNOSIS — D631 Anemia in chronic kidney disease: Secondary | ICD-10-CM | POA: Diagnosis not present

## 2023-09-16 DIAGNOSIS — N186 End stage renal disease: Secondary | ICD-10-CM | POA: Diagnosis not present

## 2023-09-17 NOTE — Telephone Encounter (Signed)
 Pt has appt 09/19/23 Wallis Bamberg, NP for preop clearance. I will update all parties involved.

## 2023-09-18 DIAGNOSIS — E876 Hypokalemia: Secondary | ICD-10-CM | POA: Diagnosis not present

## 2023-09-18 DIAGNOSIS — N2581 Secondary hyperparathyroidism of renal origin: Secondary | ICD-10-CM | POA: Diagnosis not present

## 2023-09-18 DIAGNOSIS — D631 Anemia in chronic kidney disease: Secondary | ICD-10-CM | POA: Diagnosis not present

## 2023-09-18 DIAGNOSIS — N186 End stage renal disease: Secondary | ICD-10-CM | POA: Diagnosis not present

## 2023-09-18 DIAGNOSIS — Z992 Dependence on renal dialysis: Secondary | ICD-10-CM | POA: Diagnosis not present

## 2023-09-18 NOTE — Progress Notes (Addendum)
 " Cardiology Office Note:  .   Date:  09/19/2023  ID:  Ariel [name], DOB [birth year], MRN [id number] PCP: Elaine Garnette BIRCH., MD  [facility] Providers Cardiologist:  None    History of Present Illness: .   Ariel [name] is a 48 y.o. female with a past medical history of hypertension, history of DVT, IBS, history of pancreatitis, GERD, hypothyroidism, ESRD on HD, dyslipidemia, SVT, history of anal cancer.  11/29/2022 left heart cath normal coronary arteries 10/11/2022 monitor average heart rate 81 bpm, predominant rhythm was sinus, 29 episodes of SVT  Recently she was evaluated by Dr. Edwyna on 11/21/2022, She continued to have episodes of angina, in spite of being on multiple antianginals, and the decision was made to proceed with left coronary arteries.  She presents today with concerns of elevated blood pressure readings, as high as the 200s systolic range.  Today in the office is elevated at 173/108.  She is trying to get back on the transplant list.  She also has an upcoming ACDF, she questions whether the considerable pain she has been having could be affecting her blood pressure.  She was also told that anesthesia advised she needed a repeat echocardiogram prior to undergoing surgery. She denies chest pain, palpitations, dyspnea, pnd, orthopnea, n, v, dizziness, syncope, edema, weight gain, or early satiety.    ROS: Review of Systems  Musculoskeletal:  Positive for neck pain.  All other systems reviewed and are negative.    Studies Reviewed: SABRA   EKG Interpretation Date/Time:  Thursday September 19 2023 14:23:09 EST Ventricular Rate:  84 PR Interval:  166 QRS Duration:  76 QT Interval:  388 QTC Calculation: 458 R Axis:   24  Text Interpretation: Normal sinus rhythm Possible Left atrial enlargement When compared with ECG of 29-Nov-2022 13:14, No significant change was found Confirmed by Carlin Nest [id number]) on 09/19/2023 2:29:45 PM    Cardiac Studies &  Procedures   CARDIAC CATHETERIZATION  CARDIAC CATHETERIZATION 11/29/2022  Narrative 1.  Normal right dominant coronary circulation with no high-grade obstructive disease. 2.  No coronary microvascular dysfunction based on CFR of 2.3 with normal being greater than 2 and index of mitral resistance of 12 with normal being less than 25. 3.  An assessment of coronary vasospasm cannot be performed today due to the nationwide shortage of IC acetylcholine.  No catheter induced spasm was noted. 4.  LVEDP of 14 mmHg.  Recommendation: Medical therapy  Findings Coronary Findings Diagnostic  Dominance: Right  No diagnostic findings have been documented. Intervention  No interventions have been documented.   STRESS TESTS  MYOCARDIAL PERFUSION IMAGING 05/15/2022  Narrative   The study is normal. The study is low risk.   Left ventricular function is normal. Nuclear stress EF: 64 %. The left ventricular ejection fraction is normal (55-65%). End diastolic cavity size is normal.   Increased uptake of isotope seen in the mid right lung field.  Clinical correlation suggested  ECHOCARDIOGRAM  ECHOCARDIOGRAM COMPLETE 12/21/2020  Narrative ECHOCARDIOGRAM REPORT    Patient Name:   Ariel [name] Date of Exam: 12/21/2020 Medical Rec #:  [id number]              Height: Accession #:    [id number]             Weight: Date of Birth:  [birth year]              BSA: Patient Age:    89 years  BP:           116/84 mmHg Patient Gender: F                      HR:           71 bpm. Exam Location:  [place]  Procedure: 2D Echo  Indications:    Edema of lower extremity [R60.0 (ICD-10-CM)]  History:        Patient has no prior history of Echocardiogram examinations. Signs/Symptoms:Chest Pain; Risk Factors:Hypertension.  Sonographer:    Lynwood Silvas Referring Phys: [phone] ELIZABETH UPTON  IMPRESSIONS   1. Left ventricular ejection fraction, by estimation, is 60 to 65%. The left  ventricle has normal function. The left ventricle has no regional wall motion abnormalities. There is severe concentric left ventricular hypertrophy. Left ventricular diastolic parameters are consistent with Grade I diastolic dysfunction (impaired relaxation). The average left ventricular global longitudinal strain is -9.3 %. The global longitudinal strain is abnormal. 2. Right ventricular systolic function is normal. The right ventricular size is normal. There is normal pulmonary artery systolic pressure. 3. The mitral valve is degenerative. No evidence of mitral valve regurgitation. No evidence of mitral stenosis. 4. The aortic valve is tricuspid. Aortic valve regurgitation is not visualized. No aortic stenosis is present. 5. The inferior vena cava is normal in size with greater than 50% respiratory variability, suggesting right atrial pressure of 3 mmHg.  FINDINGS Left Ventricle: Left ventricular ejection fraction, by estimation, is 60 to 65%. The left ventricle has normal function. The left ventricle has no regional wall motion abnormalities. The average left ventricular global longitudinal strain is -9.3 %. The global longitudinal strain is abnormal. The left ventricular internal cavity size was normal in size. There is severe concentric left ventricular hypertrophy. Left ventricular diastolic parameters are consistent with Grade I diastolic dysfunction (impaired relaxation). Indeterminate filling pressures.  Right Ventricle: The right ventricular size is normal. No increase in right ventricular wall thickness. Right ventricular systolic function is normal. There is normal pulmonary artery systolic pressure. The tricuspid regurgitant velocity is 1.98 m/s, and with an assumed right atrial pressure of 3 mmHg, the estimated right ventricular systolic pressure is 18.7 mmHg.  Left Atrium: Left atrial size was normal in size.  Right Atrium: Right atrial size was normal in size.  Pericardium: There  is no evidence of pericardial effusion.  Mitral Valve: The mitral valve is degenerative in appearance. There is mild calcification of the mitral valve leaflet(s). Mild to moderate mitral annular calcification and calcification of the papillary muscle. No evidence of mitral valve regurgitation. No evidence of mitral valve stenosis.  Tricuspid Valve: The tricuspid valve is normal in structure. Tricuspid valve regurgitation is mild . No evidence of tricuspid stenosis.  Aortic Valve: The aortic valve is tricuspid. Aortic valve regurgitation is not visualized. No aortic stenosis is present.  Pulmonic Valve: The pulmonic valve was normal in structure. Pulmonic valve regurgitation is not visualized. No evidence of pulmonic stenosis.  Aorta: The aortic root and ascending aorta are structurally normal, with no evidence of dilitation and the aortic arch was not well visualized.  Venous: A normal flow pattern is recorded from the right upper pulmonary vein. The inferior vena cava is normal in size with greater than 50% respiratory variability, suggesting right atrial pressure of 3 mmHg.  IAS/Shunts: No atrial level shunt detected by color flow Doppler.   LEFT VENTRICLE PLAX 2D LVIDd:         3.20 cm  Diastology LVIDs:         2.10 cm  LV e' medial:    0.42 cm/s LV PW:         1.50 cm  LV E/e' medial:  171.4 LV IVS:        1.50 cm  LV e' lateral:   0.40 cm/s LVOT diam:     2.00 cm  LV E/e' lateral: 180.0 LV SV:         60 LVOT Area:     3.14 cm 2D Longitudinal Strain 2D Strain GLS Avg:     -9.3 %  RIGHT VENTRICLE             IVC RV S prime:     10.60 cm/s  IVC diam: 1.20 cm TAPSE (M-mode): 1.9 cm  LEFT ATRIUM LA diam:      3.90 cm LA Vol (A2C): 27.0 ml LA Vol (A4C): 28.0 ml AORTIC VALVE LVOT Vmax:   10.80 cm/s LVOT Vmean:  72.800 cm/s LVOT VTI:    0.190 m  AORTA Ao Asc diam:  2.90 cm Ao Desc diam: 2.00 cm  MV E velocity: 72.00 cm/s    TRICUSPID VALVE MV A velocity: 7500.00 cm/s   TR Peak grad:   15.7 mmHg MV E/A ratio:  0.01          TR Vmax:        198.00 cm/s  SHUNTS Systemic VTI:  0.19 m Systemic Diam: 2.00 cm  Redell Leiter MD Electronically signed by Redell Leiter MD Signature Date/Time: 12/21/2020/12:26:39 PM    Final   MONITORS  LONG TERM MONITOR (3-14 DAYS) 11/06/2022  Narrative Patch Wear Time:  13 days and 14 hours (2024-02-08T16:30:09-498 to 2024-02-22T06:57:20-0500)  Patient had a min HR of 58 bpm, max HR of 214 bpm, and avg HR of 91 bpm.  Predominant underlying rhythm was Sinus Rhythm.  29 Supraventricular Tachycardia runs occurred, the run with the fastest interval lasting 6 beats with a max rate of 214 bpm, the longest lasting 32.1 secs with an avg rate of 101 bpm. Supraventricular Tachycardia was detected within +/- 45 seconds of symptomatic patient event(s). Isolated SVEs were rare (<1.0%), SVE Couplets were rare (<1.0%), and SVE Triplets were rare (<1.0%).  Isolated VEs were rare (<1.0%), and no VE Couplets or VE Triplets were present.  Impression: Abnormal event monitor with brief atrial runs as mentioned above were noted.           Risk Assessment/Calculations:     HYPERTENSION CONTROL Vitals:   09/19/23 1411 09/19/23 1621  BP: (!) 173/108 (!) 173/80    The patient's blood pressure is elevated above target today.  In order to address the patient's elevated BP: A current anti-hypertensive medication was adjusted today.; A new medication was prescribed today.          Physical Exam:   VS:  BP (!) 173/80   Pulse 96   Ht 4' 11 (1.499 m)   Wt 164 lb (74.4 kg)   LMP 09/13/2013   SpO2 95%   BMI 33.12 kg/m    Wt Readings from Last 3 Encounters:  09/19/23 164 lb (74.4 kg)  01/01/23 168 lb 6.4 oz (76.4 kg)  12/06/22 168 lb 1.6 oz (76.2 kg)    GEN: Well nourished, well developed in no acute distress NECK: No JVD; No carotid bruits CARDIAC: RRR, no murmurs, rubs, gallops RESPIRATORY:  Clear to auscultation without rales,  wheezing or rhonchi  ABDOMEN: Soft, non-tender, non-distended EXTREMITIES:  No edema; No deformity   ASSESSMENT AND PLAN: .   Hypertension-blood pressure is elevated in the office, has also been getting very elevated readings during her dialysis session.  She was evaluated in the ED and she was started on nifedipine and losartan  however her nephro advised her to discontinue the nifedipine.  Will restart her on Coreg  6.25 mg twice daily--was previously on 25 mg twice daily and her heart rate today is 96, will start her on her Apresoline  25 mg 3 times daily.  ESRD on HD-Monday Wednesday Friday, she is trying to get back on the transplantation list as well.  Precordial pain-this been bothersome for her for some time, she had previously been started on nitrates without resolution of pain, eventually underwent left heart cath which revealed normal coronary arteries.  Diastolic dysfunction-most recent echo in 2022 revealed a normal EF, severe concentric left wall thickening.  Will repeat echocardiogram.    Preoperative cardiovascular evaluation-plans for upcoming ACDF, however we need to get better control of her blood pressure.  Will also plan to repeat an echocardiogram.  Had a left heart cath in March 2024 which revealed normal coronary arteries.  If her echo is okay then she can proceed with her ACDF at an acceptable  risk.    Dispo: Start Coreg  6.25 mg twice daily, start Apresoline  25 mg 3 times daily, repeat echocardiogram.  **Addendum-we repeated an echocardiogram for preoperative evaluation which revealed a preserved EF, moderate concentric LVH which is a slight improvement from her last echocardiogram, mild MR, overall stable and slightly improved result.  From a cardiac perspective she is optimized and can proceed with surgery and acceptable risk.  Signed, Delon JAYSON Hoover, NP  "

## 2023-09-19 ENCOUNTER — Ambulatory Visit: Payer: 59 | Attending: Cardiology | Admitting: Cardiology

## 2023-09-19 ENCOUNTER — Encounter: Payer: Self-pay | Admitting: Cardiology

## 2023-09-19 VITALS — BP 173/80 | HR 96 | Ht 59.0 in | Wt 164.0 lb

## 2023-09-19 DIAGNOSIS — N186 End stage renal disease: Secondary | ICD-10-CM | POA: Diagnosis not present

## 2023-09-19 DIAGNOSIS — Z01818 Encounter for other preprocedural examination: Secondary | ICD-10-CM

## 2023-09-19 DIAGNOSIS — Z992 Dependence on renal dialysis: Secondary | ICD-10-CM

## 2023-09-19 DIAGNOSIS — R002 Palpitations: Secondary | ICD-10-CM | POA: Diagnosis not present

## 2023-09-19 DIAGNOSIS — R0609 Other forms of dyspnea: Secondary | ICD-10-CM | POA: Diagnosis not present

## 2023-09-19 DIAGNOSIS — I10 Essential (primary) hypertension: Secondary | ICD-10-CM | POA: Diagnosis not present

## 2023-09-19 DIAGNOSIS — I209 Angina pectoris, unspecified: Secondary | ICD-10-CM | POA: Diagnosis not present

## 2023-09-19 MED ORDER — HYDRALAZINE HCL 25 MG PO TABS: 25.0000 mg | ORAL_TABLET | Freq: Three times a day (TID) | ORAL | 3 refills | Status: AC

## 2023-09-19 MED ORDER — CARVEDILOL 6.25 MG PO TABS: 6.2500 mg | ORAL_TABLET | Freq: Two times a day (BID) | ORAL | 3 refills | Status: DC

## 2023-09-19 NOTE — Patient Instructions (Signed)
Medication Instructions:  Your physician has recommended you make the following change in your medication:  Start Carvedilol 6.25 two times daily Start Hydralazine 25 mg three times daily  *If you need a refill on your cardiac medications before your next appointment, please call your pharmacy*   Lab Work: NONE If you have labs (blood work) drawn today and your tests are completely normal, you will receive your results only by: MyChart Message (if you have MyChart) OR A paper copy in the mail If you have any lab test that is abnormal or we need to change your treatment, we will call you to review the results.   Testing/Procedures: Your physician has requested that you have an echocardiogram. Echocardiography is a painless test that uses sound waves to create images of your heart. It provides your doctor with information about the size and shape of your heart and how well your heart's chambers and valves are working. This procedure takes approximately one hour. There are no restrictions for this procedure. Please do NOT wear cologne, perfume, aftershave, or lotions (deodorant is allowed). Please arrive 15 minutes prior to your appointment time.  Please note: We ask at that you not bring children with you during ultrasound (echo/ vascular) testing. Due to room size and safety concerns, children are not allowed in the ultrasound rooms during exams. Our front office staff cannot provide observation of children in our lobby area while testing is being conducted. An adult accompanying a patient to their appointment will only be allowed in the ultrasound room at the discretion of the ultrasound technician under special circumstances. We apologize for any inconvenience.    Follow-Up: At [facility], you and your health needs are our priority.  As part of our continuing mission to provide you with exceptional heart care, we have created designated Provider Care Teams.  These Care Teams  include your primary Cardiologist (physician) and Advanced Practice Providers (APPs -  Physician Assistants and Nurse Practitioners) who all work together to provide you with the care you need, when you need it.  We recommend signing up for the patient portal called "MyChart".  Sign up information is provided on this After Visit Summary.  MyChart is used to connect with patients for Virtual Visits (Telemedicine).  Patients are able to view lab/test results, encounter notes, upcoming appointments, etc.  Non-urgent messages can be sent to your provider as well.   To learn more about what you can do with MyChart, go to [email or web address].    Your next appointment:   6 month(s)  Provider:   Belva Crome, MD    Other Instructions

## 2023-09-20 DIAGNOSIS — N2581 Secondary hyperparathyroidism of renal origin: Secondary | ICD-10-CM | POA: Diagnosis not present

## 2023-09-20 DIAGNOSIS — E876 Hypokalemia: Secondary | ICD-10-CM | POA: Diagnosis not present

## 2023-09-20 DIAGNOSIS — N186 End stage renal disease: Secondary | ICD-10-CM | POA: Diagnosis not present

## 2023-09-20 DIAGNOSIS — Z992 Dependence on renal dialysis: Secondary | ICD-10-CM | POA: Diagnosis not present

## 2023-09-20 DIAGNOSIS — D631 Anemia in chronic kidney disease: Secondary | ICD-10-CM | POA: Diagnosis not present

## 2023-09-23 ENCOUNTER — Telehealth: Payer: Self-pay | Admitting: Cardiology

## 2023-09-23 DIAGNOSIS — Z992 Dependence on renal dialysis: Secondary | ICD-10-CM | POA: Diagnosis not present

## 2023-09-23 DIAGNOSIS — N2581 Secondary hyperparathyroidism of renal origin: Secondary | ICD-10-CM | POA: Diagnosis not present

## 2023-09-23 DIAGNOSIS — N186 End stage renal disease: Secondary | ICD-10-CM | POA: Diagnosis not present

## 2023-09-23 DIAGNOSIS — E876 Hypokalemia: Secondary | ICD-10-CM | POA: Diagnosis not present

## 2023-09-23 DIAGNOSIS — D631 Anemia in chronic kidney disease: Secondary | ICD-10-CM | POA: Diagnosis not present

## 2023-09-23 NOTE — Telephone Encounter (Signed)
I s/w Jasmine December with Dr. Loralie Champagne office and informed her that the pt is scheduled for an echo to be done 10/10/23. Once the pt has been cleared we will fax notes providing clearance.

## 2023-09-23 NOTE — Telephone Encounter (Signed)
Follow Up:     She is calling to check on the status of patient's clearance. Please fax asap to [phone].

## 2023-09-24 DIAGNOSIS — I10 Essential (primary) hypertension: Secondary | ICD-10-CM | POA: Diagnosis not present

## 2023-09-24 DIAGNOSIS — R300 Dysuria: Secondary | ICD-10-CM | POA: Diagnosis not present

## 2023-09-24 DIAGNOSIS — N185 Chronic kidney disease, stage 5: Secondary | ICD-10-CM | POA: Diagnosis not present

## 2023-09-26 DIAGNOSIS — M1712 Unilateral primary osteoarthritis, left knee: Secondary | ICD-10-CM | POA: Diagnosis not present

## 2023-09-27 DIAGNOSIS — D631 Anemia in chronic kidney disease: Secondary | ICD-10-CM | POA: Diagnosis not present

## 2023-09-27 DIAGNOSIS — E876 Hypokalemia: Secondary | ICD-10-CM | POA: Diagnosis not present

## 2023-09-27 DIAGNOSIS — Z992 Dependence on renal dialysis: Secondary | ICD-10-CM | POA: Diagnosis not present

## 2023-09-27 DIAGNOSIS — N2581 Secondary hyperparathyroidism of renal origin: Secondary | ICD-10-CM | POA: Diagnosis not present

## 2023-09-27 DIAGNOSIS — N186 End stage renal disease: Secondary | ICD-10-CM | POA: Diagnosis not present

## 2023-09-30 DIAGNOSIS — N2581 Secondary hyperparathyroidism of renal origin: Secondary | ICD-10-CM | POA: Diagnosis not present

## 2023-09-30 DIAGNOSIS — E876 Hypokalemia: Secondary | ICD-10-CM | POA: Diagnosis not present

## 2023-09-30 DIAGNOSIS — D631 Anemia in chronic kidney disease: Secondary | ICD-10-CM | POA: Diagnosis not present

## 2023-09-30 DIAGNOSIS — Z992 Dependence on renal dialysis: Secondary | ICD-10-CM | POA: Diagnosis not present

## 2023-09-30 DIAGNOSIS — N186 End stage renal disease: Secondary | ICD-10-CM | POA: Diagnosis not present

## 2023-10-04 DIAGNOSIS — Z992 Dependence on renal dialysis: Secondary | ICD-10-CM | POA: Diagnosis not present

## 2023-10-04 DIAGNOSIS — T8610 Unspecified complication of kidney transplant: Secondary | ICD-10-CM | POA: Diagnosis not present

## 2023-10-04 DIAGNOSIS — N186 End stage renal disease: Secondary | ICD-10-CM | POA: Diagnosis not present

## 2023-10-04 DIAGNOSIS — E876 Hypokalemia: Secondary | ICD-10-CM | POA: Diagnosis not present

## 2023-10-04 DIAGNOSIS — N2581 Secondary hyperparathyroidism of renal origin: Secondary | ICD-10-CM | POA: Diagnosis not present

## 2023-10-04 DIAGNOSIS — D631 Anemia in chronic kidney disease: Secondary | ICD-10-CM | POA: Diagnosis not present

## 2023-10-07 DIAGNOSIS — Z992 Dependence on renal dialysis: Secondary | ICD-10-CM | POA: Diagnosis not present

## 2023-10-07 DIAGNOSIS — N186 End stage renal disease: Secondary | ICD-10-CM | POA: Diagnosis not present

## 2023-10-07 DIAGNOSIS — N2581 Secondary hyperparathyroidism of renal origin: Secondary | ICD-10-CM | POA: Diagnosis not present

## 2023-10-07 DIAGNOSIS — D631 Anemia in chronic kidney disease: Secondary | ICD-10-CM | POA: Diagnosis not present

## 2023-10-07 DIAGNOSIS — E876 Hypokalemia: Secondary | ICD-10-CM | POA: Diagnosis not present

## 2023-10-08 DIAGNOSIS — R062 Wheezing: Secondary | ICD-10-CM | POA: Diagnosis not present

## 2023-10-08 DIAGNOSIS — R0602 Shortness of breath: Secondary | ICD-10-CM | POA: Diagnosis not present

## 2023-10-08 DIAGNOSIS — R0981 Nasal congestion: Secondary | ICD-10-CM | POA: Diagnosis not present

## 2023-10-08 DIAGNOSIS — R059 Cough, unspecified: Secondary | ICD-10-CM | POA: Diagnosis not present

## 2023-10-10 ENCOUNTER — Ambulatory Visit: Payer: 59 | Attending: Cardiology

## 2023-10-10 DIAGNOSIS — Z992 Dependence on renal dialysis: Secondary | ICD-10-CM | POA: Diagnosis not present

## 2023-10-10 DIAGNOSIS — N186 End stage renal disease: Secondary | ICD-10-CM

## 2023-10-10 DIAGNOSIS — R0609 Other forms of dyspnea: Secondary | ICD-10-CM

## 2023-10-10 DIAGNOSIS — I10 Essential (primary) hypertension: Secondary | ICD-10-CM

## 2023-10-10 DIAGNOSIS — R002 Palpitations: Secondary | ICD-10-CM | POA: Diagnosis not present

## 2023-10-10 DIAGNOSIS — I209 Angina pectoris, unspecified: Secondary | ICD-10-CM

## 2023-10-10 DIAGNOSIS — Z01810 Encounter for preprocedural cardiovascular examination: Secondary | ICD-10-CM

## 2023-10-10 LAB — ECHOCARDIOGRAM COMPLETE
AR max vel: 1.69 cm2
AV Area VTI: 1.7 cm2
AV Area mean vel: 1.62 cm2
AV Mean grad: 9.7 mmHg
AV Peak grad: 17.9 mmHg
Ao pk vel: 2.12 m/s
Area-P 1/2: 3.88 cm2
MV M vel: 4.61 m/s
MV Peak grad: 85 mmHg
S' Lateral: 3.9 cm

## 2023-10-11 DIAGNOSIS — N186 End stage renal disease: Secondary | ICD-10-CM | POA: Diagnosis not present

## 2023-10-11 DIAGNOSIS — D631 Anemia in chronic kidney disease: Secondary | ICD-10-CM | POA: Diagnosis not present

## 2023-10-11 DIAGNOSIS — Z992 Dependence on renal dialysis: Secondary | ICD-10-CM | POA: Diagnosis not present

## 2023-10-11 DIAGNOSIS — N2581 Secondary hyperparathyroidism of renal origin: Secondary | ICD-10-CM | POA: Diagnosis not present

## 2023-10-11 DIAGNOSIS — E876 Hypokalemia: Secondary | ICD-10-CM | POA: Diagnosis not present

## 2023-10-14 DIAGNOSIS — N2581 Secondary hyperparathyroidism of renal origin: Secondary | ICD-10-CM | POA: Diagnosis not present

## 2023-10-14 DIAGNOSIS — N186 End stage renal disease: Secondary | ICD-10-CM | POA: Diagnosis not present

## 2023-10-14 DIAGNOSIS — Z992 Dependence on renal dialysis: Secondary | ICD-10-CM | POA: Diagnosis not present

## 2023-10-14 DIAGNOSIS — E876 Hypokalemia: Secondary | ICD-10-CM | POA: Diagnosis not present

## 2023-10-14 DIAGNOSIS — D631 Anemia in chronic kidney disease: Secondary | ICD-10-CM | POA: Diagnosis not present

## 2023-10-16 DIAGNOSIS — N2581 Secondary hyperparathyroidism of renal origin: Secondary | ICD-10-CM | POA: Diagnosis not present

## 2023-10-16 DIAGNOSIS — Z992 Dependence on renal dialysis: Secondary | ICD-10-CM | POA: Diagnosis not present

## 2023-10-16 DIAGNOSIS — E876 Hypokalemia: Secondary | ICD-10-CM | POA: Diagnosis not present

## 2023-10-16 DIAGNOSIS — N186 End stage renal disease: Secondary | ICD-10-CM | POA: Diagnosis not present

## 2023-10-16 DIAGNOSIS — D631 Anemia in chronic kidney disease: Secondary | ICD-10-CM | POA: Diagnosis not present

## 2023-10-17 DIAGNOSIS — N179 Acute kidney failure, unspecified: Secondary | ICD-10-CM | POA: Diagnosis not present

## 2023-10-17 DIAGNOSIS — R6889 Other general symptoms and signs: Secondary | ICD-10-CM | POA: Diagnosis not present

## 2023-10-17 DIAGNOSIS — Z01818 Encounter for other preprocedural examination: Secondary | ICD-10-CM | POA: Diagnosis not present

## 2023-10-18 DIAGNOSIS — Z992 Dependence on renal dialysis: Secondary | ICD-10-CM | POA: Diagnosis not present

## 2023-10-18 DIAGNOSIS — D631 Anemia in chronic kidney disease: Secondary | ICD-10-CM | POA: Diagnosis not present

## 2023-10-18 DIAGNOSIS — N2581 Secondary hyperparathyroidism of renal origin: Secondary | ICD-10-CM | POA: Diagnosis not present

## 2023-10-18 DIAGNOSIS — E876 Hypokalemia: Secondary | ICD-10-CM | POA: Diagnosis not present

## 2023-10-18 DIAGNOSIS — N186 End stage renal disease: Secondary | ICD-10-CM | POA: Diagnosis not present

## 2023-10-19 DIAGNOSIS — H1033 Unspecified acute conjunctivitis, bilateral: Secondary | ICD-10-CM | POA: Diagnosis not present

## 2023-10-21 ENCOUNTER — Telehealth: Payer: Self-pay | Admitting: Emergency Medicine

## 2023-10-21 DIAGNOSIS — D631 Anemia in chronic kidney disease: Secondary | ICD-10-CM | POA: Diagnosis not present

## 2023-10-21 DIAGNOSIS — N2581 Secondary hyperparathyroidism of renal origin: Secondary | ICD-10-CM | POA: Diagnosis not present

## 2023-10-21 DIAGNOSIS — N186 End stage renal disease: Secondary | ICD-10-CM | POA: Diagnosis not present

## 2023-10-21 DIAGNOSIS — E876 Hypokalemia: Secondary | ICD-10-CM | POA: Diagnosis not present

## 2023-10-21 DIAGNOSIS — Z992 Dependence on renal dialysis: Secondary | ICD-10-CM | POA: Diagnosis not present

## 2023-10-21 NOTE — Telephone Encounter (Signed)
-----   Message from Flossie Dibble sent at 10/14/2023  8:12 AM EST ----- Your echocardiogram showed that your heart is squeezing well, and is moderately thick--this is actually improved from your last echocardiogram where it was severely thick.  You do have mild leaking around her mitral valve--this is nothing that would cause symptoms.  Overall this is a slightly improved result.  Since this is good, she can proceed with surgery at an acceptable risk.  Will forward to surgeon

## 2023-10-21 NOTE — Telephone Encounter (Signed)
 Results reviewed with pt as per Wallis Bamberg NP's note.  Pt verbalized understanding and had no additional questions. Routed to PCP.

## 2023-10-22 DIAGNOSIS — M79609 Pain in unspecified limb: Secondary | ICD-10-CM | POA: Diagnosis not present

## 2023-10-22 DIAGNOSIS — M1712 Unilateral primary osteoarthritis, left knee: Secondary | ICD-10-CM | POA: Diagnosis not present

## 2023-10-22 DIAGNOSIS — R52 Pain, unspecified: Secondary | ICD-10-CM | POA: Diagnosis not present

## 2023-10-22 DIAGNOSIS — Z79899 Other long term (current) drug therapy: Secondary | ICD-10-CM | POA: Diagnosis not present

## 2023-10-23 DIAGNOSIS — E876 Hypokalemia: Secondary | ICD-10-CM | POA: Diagnosis not present

## 2023-10-23 DIAGNOSIS — D631 Anemia in chronic kidney disease: Secondary | ICD-10-CM | POA: Diagnosis not present

## 2023-10-23 DIAGNOSIS — N2581 Secondary hyperparathyroidism of renal origin: Secondary | ICD-10-CM | POA: Diagnosis not present

## 2023-10-23 DIAGNOSIS — Z992 Dependence on renal dialysis: Secondary | ICD-10-CM | POA: Diagnosis not present

## 2023-10-23 DIAGNOSIS — N186 End stage renal disease: Secondary | ICD-10-CM | POA: Diagnosis not present

## 2023-10-25 DIAGNOSIS — N2581 Secondary hyperparathyroidism of renal origin: Secondary | ICD-10-CM | POA: Diagnosis not present

## 2023-10-25 DIAGNOSIS — D631 Anemia in chronic kidney disease: Secondary | ICD-10-CM | POA: Diagnosis not present

## 2023-10-25 DIAGNOSIS — Z992 Dependence on renal dialysis: Secondary | ICD-10-CM | POA: Diagnosis not present

## 2023-10-25 DIAGNOSIS — E876 Hypokalemia: Secondary | ICD-10-CM | POA: Diagnosis not present

## 2023-10-25 DIAGNOSIS — N186 End stage renal disease: Secondary | ICD-10-CM | POA: Diagnosis not present

## 2023-10-28 DIAGNOSIS — E876 Hypokalemia: Secondary | ICD-10-CM | POA: Diagnosis not present

## 2023-10-28 DIAGNOSIS — N2581 Secondary hyperparathyroidism of renal origin: Secondary | ICD-10-CM | POA: Diagnosis not present

## 2023-10-28 DIAGNOSIS — Z992 Dependence on renal dialysis: Secondary | ICD-10-CM | POA: Diagnosis not present

## 2023-10-28 DIAGNOSIS — D631 Anemia in chronic kidney disease: Secondary | ICD-10-CM | POA: Diagnosis not present

## 2023-10-28 DIAGNOSIS — N186 End stage renal disease: Secondary | ICD-10-CM | POA: Diagnosis not present

## 2023-10-29 DIAGNOSIS — R5383 Other fatigue: Secondary | ICD-10-CM | POA: Diagnosis not present

## 2023-10-29 DIAGNOSIS — M159 Polyosteoarthritis, unspecified: Secondary | ICD-10-CM | POA: Diagnosis not present

## 2023-10-29 DIAGNOSIS — M109 Gout, unspecified: Secondary | ICD-10-CM | POA: Diagnosis not present

## 2023-10-29 DIAGNOSIS — K219 Gastro-esophageal reflux disease without esophagitis: Secondary | ICD-10-CM | POA: Diagnosis not present

## 2023-10-29 DIAGNOSIS — Z992 Dependence on renal dialysis: Secondary | ICD-10-CM | POA: Diagnosis not present

## 2023-10-29 DIAGNOSIS — N186 End stage renal disease: Secondary | ICD-10-CM | POA: Diagnosis not present

## 2023-10-29 DIAGNOSIS — I10 Essential (primary) hypertension: Secondary | ICD-10-CM | POA: Diagnosis not present

## 2023-10-30 DIAGNOSIS — M4722 Other spondylosis with radiculopathy, cervical region: Secondary | ICD-10-CM | POA: Diagnosis not present

## 2023-10-30 DIAGNOSIS — E039 Hypothyroidism, unspecified: Secondary | ICD-10-CM | POA: Diagnosis not present

## 2023-10-30 DIAGNOSIS — N186 End stage renal disease: Secondary | ICD-10-CM | POA: Diagnosis not present

## 2023-10-30 DIAGNOSIS — Z992 Dependence on renal dialysis: Secondary | ICD-10-CM | POA: Diagnosis not present

## 2023-10-30 DIAGNOSIS — I120 Hypertensive chronic kidney disease with stage 5 chronic kidney disease or end stage renal disease: Secondary | ICD-10-CM | POA: Diagnosis not present

## 2023-10-30 DIAGNOSIS — Z79899 Other long term (current) drug therapy: Secondary | ICD-10-CM | POA: Diagnosis not present

## 2023-10-30 DIAGNOSIS — Z940 Kidney transplant status: Secondary | ICD-10-CM | POA: Diagnosis not present

## 2023-10-30 DIAGNOSIS — E785 Hyperlipidemia, unspecified: Secondary | ICD-10-CM | POA: Diagnosis not present

## 2023-10-30 DIAGNOSIS — Z538 Procedure and treatment not carried out for other reasons: Secondary | ICD-10-CM | POA: Diagnosis not present

## 2023-11-01 DIAGNOSIS — T8610 Unspecified complication of kidney transplant: Secondary | ICD-10-CM | POA: Diagnosis not present

## 2023-11-01 DIAGNOSIS — E876 Hypokalemia: Secondary | ICD-10-CM | POA: Diagnosis not present

## 2023-11-01 DIAGNOSIS — D631 Anemia in chronic kidney disease: Secondary | ICD-10-CM | POA: Diagnosis not present

## 2023-11-01 DIAGNOSIS — Z992 Dependence on renal dialysis: Secondary | ICD-10-CM | POA: Diagnosis not present

## 2023-11-01 DIAGNOSIS — N186 End stage renal disease: Secondary | ICD-10-CM | POA: Diagnosis not present

## 2023-11-01 DIAGNOSIS — N2581 Secondary hyperparathyroidism of renal origin: Secondary | ICD-10-CM | POA: Diagnosis not present

## 2023-11-04 DIAGNOSIS — D631 Anemia in chronic kidney disease: Secondary | ICD-10-CM | POA: Diagnosis not present

## 2023-11-04 DIAGNOSIS — E876 Hypokalemia: Secondary | ICD-10-CM | POA: Diagnosis not present

## 2023-11-04 DIAGNOSIS — Z992 Dependence on renal dialysis: Secondary | ICD-10-CM | POA: Diagnosis not present

## 2023-11-04 DIAGNOSIS — N2581 Secondary hyperparathyroidism of renal origin: Secondary | ICD-10-CM | POA: Diagnosis not present

## 2023-11-04 DIAGNOSIS — N186 End stage renal disease: Secondary | ICD-10-CM | POA: Diagnosis not present

## 2023-11-05 DIAGNOSIS — M4722 Other spondylosis with radiculopathy, cervical region: Secondary | ICD-10-CM | POA: Diagnosis not present

## 2023-11-05 DIAGNOSIS — M50122 Cervical disc disorder at C5-C6 level with radiculopathy: Secondary | ICD-10-CM | POA: Diagnosis not present

## 2023-11-05 DIAGNOSIS — E871 Hypo-osmolality and hyponatremia: Secondary | ICD-10-CM | POA: Diagnosis not present

## 2023-11-05 DIAGNOSIS — G4700 Insomnia, unspecified: Secondary | ICD-10-CM | POA: Diagnosis not present

## 2023-11-05 DIAGNOSIS — Z885 Allergy status to narcotic agent status: Secondary | ICD-10-CM | POA: Diagnosis not present

## 2023-11-05 DIAGNOSIS — M1712 Unilateral primary osteoarthritis, left knee: Secondary | ICD-10-CM | POA: Diagnosis not present

## 2023-11-05 DIAGNOSIS — I120 Hypertensive chronic kidney disease with stage 5 chronic kidney disease or end stage renal disease: Secondary | ICD-10-CM | POA: Diagnosis not present

## 2023-11-05 DIAGNOSIS — M50322 Other cervical disc degeneration at C5-C6 level: Secondary | ICD-10-CM | POA: Diagnosis not present

## 2023-11-05 DIAGNOSIS — I129 Hypertensive chronic kidney disease with stage 1 through stage 4 chronic kidney disease, or unspecified chronic kidney disease: Secondary | ICD-10-CM | POA: Diagnosis not present

## 2023-11-05 DIAGNOSIS — E7800 Pure hypercholesterolemia, unspecified: Secondary | ICD-10-CM | POA: Diagnosis not present

## 2023-11-05 DIAGNOSIS — Z882 Allergy status to sulfonamides status: Secondary | ICD-10-CM | POA: Diagnosis not present

## 2023-11-05 DIAGNOSIS — G4730 Sleep apnea, unspecified: Secondary | ICD-10-CM | POA: Diagnosis not present

## 2023-11-05 DIAGNOSIS — Z79899 Other long term (current) drug therapy: Secondary | ICD-10-CM | POA: Diagnosis not present

## 2023-11-05 DIAGNOSIS — Z86718 Personal history of other venous thrombosis and embolism: Secondary | ICD-10-CM | POA: Diagnosis not present

## 2023-11-05 DIAGNOSIS — A4902 Methicillin resistant Staphylococcus aureus infection, unspecified site: Secondary | ICD-10-CM | POA: Diagnosis not present

## 2023-11-05 DIAGNOSIS — N186 End stage renal disease: Secondary | ICD-10-CM | POA: Diagnosis not present

## 2023-11-05 DIAGNOSIS — M50323 Other cervical disc degeneration at C6-C7 level: Secondary | ICD-10-CM | POA: Diagnosis not present

## 2023-11-05 DIAGNOSIS — Z888 Allergy status to other drugs, medicaments and biological substances status: Secondary | ICD-10-CM | POA: Diagnosis not present

## 2023-11-05 DIAGNOSIS — Z992 Dependence on renal dialysis: Secondary | ICD-10-CM | POA: Diagnosis not present

## 2023-11-05 DIAGNOSIS — M5412 Radiculopathy, cervical region: Secondary | ICD-10-CM | POA: Diagnosis not present

## 2023-11-05 DIAGNOSIS — M50123 Cervical disc disorder at C6-C7 level with radiculopathy: Secondary | ICD-10-CM | POA: Diagnosis not present

## 2023-11-05 DIAGNOSIS — I160 Hypertensive urgency: Secondary | ICD-10-CM | POA: Diagnosis not present

## 2023-11-05 DIAGNOSIS — N183 Chronic kidney disease, stage 3 unspecified: Secondary | ICD-10-CM | POA: Diagnosis not present

## 2023-11-06 DIAGNOSIS — G4730 Sleep apnea, unspecified: Secondary | ICD-10-CM | POA: Diagnosis not present

## 2023-11-06 DIAGNOSIS — E871 Hypo-osmolality and hyponatremia: Secondary | ICD-10-CM | POA: Diagnosis not present

## 2023-11-06 DIAGNOSIS — M5412 Radiculopathy, cervical region: Secondary | ICD-10-CM | POA: Diagnosis not present

## 2023-11-06 DIAGNOSIS — I160 Hypertensive urgency: Secondary | ICD-10-CM | POA: Diagnosis not present

## 2023-11-06 DIAGNOSIS — M47812 Spondylosis without myelopathy or radiculopathy, cervical region: Secondary | ICD-10-CM | POA: Diagnosis not present

## 2023-11-06 DIAGNOSIS — G4700 Insomnia, unspecified: Secondary | ICD-10-CM | POA: Diagnosis not present

## 2023-11-06 DIAGNOSIS — Z992 Dependence on renal dialysis: Secondary | ICD-10-CM | POA: Diagnosis not present

## 2023-11-06 DIAGNOSIS — M50123 Cervical disc disorder at C6-C7 level with radiculopathy: Secondary | ICD-10-CM | POA: Diagnosis not present

## 2023-11-06 DIAGNOSIS — Z885 Allergy status to narcotic agent status: Secondary | ICD-10-CM | POA: Diagnosis not present

## 2023-11-06 DIAGNOSIS — N186 End stage renal disease: Secondary | ICD-10-CM | POA: Diagnosis not present

## 2023-11-06 DIAGNOSIS — Z882 Allergy status to sulfonamides status: Secondary | ICD-10-CM | POA: Diagnosis not present

## 2023-11-06 DIAGNOSIS — Z888 Allergy status to other drugs, medicaments and biological substances status: Secondary | ICD-10-CM | POA: Diagnosis not present

## 2023-11-06 DIAGNOSIS — Z79899 Other long term (current) drug therapy: Secondary | ICD-10-CM | POA: Diagnosis not present

## 2023-11-06 DIAGNOSIS — I120 Hypertensive chronic kidney disease with stage 5 chronic kidney disease or end stage renal disease: Secondary | ICD-10-CM | POA: Diagnosis not present

## 2023-11-06 DIAGNOSIS — E7800 Pure hypercholesterolemia, unspecified: Secondary | ICD-10-CM | POA: Diagnosis not present

## 2023-11-06 DIAGNOSIS — Z86718 Personal history of other venous thrombosis and embolism: Secondary | ICD-10-CM | POA: Diagnosis not present

## 2023-11-07 DIAGNOSIS — G4730 Sleep apnea, unspecified: Secondary | ICD-10-CM | POA: Diagnosis not present

## 2023-11-07 DIAGNOSIS — Z79899 Other long term (current) drug therapy: Secondary | ICD-10-CM | POA: Diagnosis not present

## 2023-11-07 DIAGNOSIS — Z992 Dependence on renal dialysis: Secondary | ICD-10-CM | POA: Diagnosis not present

## 2023-11-07 DIAGNOSIS — M50123 Cervical disc disorder at C6-C7 level with radiculopathy: Secondary | ICD-10-CM | POA: Diagnosis not present

## 2023-11-07 DIAGNOSIS — M47812 Spondylosis without myelopathy or radiculopathy, cervical region: Secondary | ICD-10-CM | POA: Diagnosis not present

## 2023-11-07 DIAGNOSIS — Z882 Allergy status to sulfonamides status: Secondary | ICD-10-CM | POA: Diagnosis not present

## 2023-11-07 DIAGNOSIS — I160 Hypertensive urgency: Secondary | ICD-10-CM | POA: Diagnosis not present

## 2023-11-07 DIAGNOSIS — Z888 Allergy status to other drugs, medicaments and biological substances status: Secondary | ICD-10-CM | POA: Diagnosis not present

## 2023-11-07 DIAGNOSIS — I120 Hypertensive chronic kidney disease with stage 5 chronic kidney disease or end stage renal disease: Secondary | ICD-10-CM | POA: Diagnosis not present

## 2023-11-07 DIAGNOSIS — M5412 Radiculopathy, cervical region: Secondary | ICD-10-CM | POA: Diagnosis not present

## 2023-11-07 DIAGNOSIS — G4700 Insomnia, unspecified: Secondary | ICD-10-CM | POA: Diagnosis not present

## 2023-11-07 DIAGNOSIS — N186 End stage renal disease: Secondary | ICD-10-CM | POA: Diagnosis not present

## 2023-11-07 DIAGNOSIS — Z86718 Personal history of other venous thrombosis and embolism: Secondary | ICD-10-CM | POA: Diagnosis not present

## 2023-11-07 DIAGNOSIS — E871 Hypo-osmolality and hyponatremia: Secondary | ICD-10-CM | POA: Diagnosis not present

## 2023-11-07 DIAGNOSIS — E7800 Pure hypercholesterolemia, unspecified: Secondary | ICD-10-CM | POA: Diagnosis not present

## 2023-11-07 DIAGNOSIS — Z885 Allergy status to narcotic agent status: Secondary | ICD-10-CM | POA: Diagnosis not present

## 2023-11-08 DIAGNOSIS — Z885 Allergy status to narcotic agent status: Secondary | ICD-10-CM | POA: Diagnosis not present

## 2023-11-08 DIAGNOSIS — G4730 Sleep apnea, unspecified: Secondary | ICD-10-CM | POA: Diagnosis not present

## 2023-11-08 DIAGNOSIS — M47812 Spondylosis without myelopathy or radiculopathy, cervical region: Secondary | ICD-10-CM | POA: Diagnosis not present

## 2023-11-08 DIAGNOSIS — M5412 Radiculopathy, cervical region: Secondary | ICD-10-CM | POA: Diagnosis not present

## 2023-11-08 DIAGNOSIS — N186 End stage renal disease: Secondary | ICD-10-CM | POA: Diagnosis not present

## 2023-11-08 DIAGNOSIS — M50123 Cervical disc disorder at C6-C7 level with radiculopathy: Secondary | ICD-10-CM | POA: Diagnosis not present

## 2023-11-08 DIAGNOSIS — Z86718 Personal history of other venous thrombosis and embolism: Secondary | ICD-10-CM | POA: Diagnosis not present

## 2023-11-08 DIAGNOSIS — G4700 Insomnia, unspecified: Secondary | ICD-10-CM | POA: Diagnosis not present

## 2023-11-08 DIAGNOSIS — I120 Hypertensive chronic kidney disease with stage 5 chronic kidney disease or end stage renal disease: Secondary | ICD-10-CM | POA: Diagnosis not present

## 2023-11-08 DIAGNOSIS — I160 Hypertensive urgency: Secondary | ICD-10-CM | POA: Diagnosis not present

## 2023-11-08 DIAGNOSIS — Z888 Allergy status to other drugs, medicaments and biological substances status: Secondary | ICD-10-CM | POA: Diagnosis not present

## 2023-11-08 DIAGNOSIS — E871 Hypo-osmolality and hyponatremia: Secondary | ICD-10-CM | POA: Diagnosis not present

## 2023-11-08 DIAGNOSIS — Z992 Dependence on renal dialysis: Secondary | ICD-10-CM | POA: Diagnosis not present

## 2023-11-08 DIAGNOSIS — Z882 Allergy status to sulfonamides status: Secondary | ICD-10-CM | POA: Diagnosis not present

## 2023-11-08 DIAGNOSIS — E7800 Pure hypercholesterolemia, unspecified: Secondary | ICD-10-CM | POA: Diagnosis not present

## 2023-11-08 DIAGNOSIS — Z79899 Other long term (current) drug therapy: Secondary | ICD-10-CM | POA: Diagnosis not present

## 2023-11-09 DIAGNOSIS — Z882 Allergy status to sulfonamides status: Secondary | ICD-10-CM | POA: Diagnosis not present

## 2023-11-09 DIAGNOSIS — E871 Hypo-osmolality and hyponatremia: Secondary | ICD-10-CM | POA: Diagnosis not present

## 2023-11-09 DIAGNOSIS — I120 Hypertensive chronic kidney disease with stage 5 chronic kidney disease or end stage renal disease: Secondary | ICD-10-CM | POA: Diagnosis not present

## 2023-11-09 DIAGNOSIS — M47812 Spondylosis without myelopathy or radiculopathy, cervical region: Secondary | ICD-10-CM | POA: Diagnosis not present

## 2023-11-09 DIAGNOSIS — Z992 Dependence on renal dialysis: Secondary | ICD-10-CM | POA: Diagnosis not present

## 2023-11-09 DIAGNOSIS — G4700 Insomnia, unspecified: Secondary | ICD-10-CM | POA: Diagnosis not present

## 2023-11-09 DIAGNOSIS — Z885 Allergy status to narcotic agent status: Secondary | ICD-10-CM | POA: Diagnosis not present

## 2023-11-09 DIAGNOSIS — Z79899 Other long term (current) drug therapy: Secondary | ICD-10-CM | POA: Diagnosis not present

## 2023-11-09 DIAGNOSIS — I160 Hypertensive urgency: Secondary | ICD-10-CM | POA: Diagnosis not present

## 2023-11-09 DIAGNOSIS — M50123 Cervical disc disorder at C6-C7 level with radiculopathy: Secondary | ICD-10-CM | POA: Diagnosis not present

## 2023-11-09 DIAGNOSIS — Z888 Allergy status to other drugs, medicaments and biological substances status: Secondary | ICD-10-CM | POA: Diagnosis not present

## 2023-11-09 DIAGNOSIS — G4730 Sleep apnea, unspecified: Secondary | ICD-10-CM | POA: Diagnosis not present

## 2023-11-09 DIAGNOSIS — E7800 Pure hypercholesterolemia, unspecified: Secondary | ICD-10-CM | POA: Diagnosis not present

## 2023-11-09 DIAGNOSIS — N186 End stage renal disease: Secondary | ICD-10-CM | POA: Diagnosis not present

## 2023-11-09 DIAGNOSIS — Z86718 Personal history of other venous thrombosis and embolism: Secondary | ICD-10-CM | POA: Diagnosis not present

## 2023-11-09 DIAGNOSIS — M5412 Radiculopathy, cervical region: Secondary | ICD-10-CM | POA: Diagnosis not present

## 2023-11-11 DIAGNOSIS — D631 Anemia in chronic kidney disease: Secondary | ICD-10-CM | POA: Diagnosis not present

## 2023-11-11 DIAGNOSIS — Z992 Dependence on renal dialysis: Secondary | ICD-10-CM | POA: Diagnosis not present

## 2023-11-11 DIAGNOSIS — N2581 Secondary hyperparathyroidism of renal origin: Secondary | ICD-10-CM | POA: Diagnosis not present

## 2023-11-11 DIAGNOSIS — N186 End stage renal disease: Secondary | ICD-10-CM | POA: Diagnosis not present

## 2023-11-11 DIAGNOSIS — E876 Hypokalemia: Secondary | ICD-10-CM | POA: Diagnosis not present

## 2023-11-13 DIAGNOSIS — D649 Anemia, unspecified: Secondary | ICD-10-CM | POA: Diagnosis not present

## 2023-11-13 DIAGNOSIS — N2581 Secondary hyperparathyroidism of renal origin: Secondary | ICD-10-CM | POA: Diagnosis not present

## 2023-11-13 DIAGNOSIS — Z992 Dependence on renal dialysis: Secondary | ICD-10-CM | POA: Diagnosis not present

## 2023-11-13 DIAGNOSIS — E876 Hypokalemia: Secondary | ICD-10-CM | POA: Diagnosis not present

## 2023-11-13 DIAGNOSIS — D631 Anemia in chronic kidney disease: Secondary | ICD-10-CM | POA: Diagnosis not present

## 2023-11-13 DIAGNOSIS — M109 Gout, unspecified: Secondary | ICD-10-CM | POA: Diagnosis not present

## 2023-11-13 DIAGNOSIS — K219 Gastro-esophageal reflux disease without esophagitis: Secondary | ICD-10-CM | POA: Diagnosis not present

## 2023-11-13 DIAGNOSIS — N186 End stage renal disease: Secondary | ICD-10-CM | POA: Diagnosis not present

## 2023-11-13 DIAGNOSIS — I10 Essential (primary) hypertension: Secondary | ICD-10-CM | POA: Diagnosis not present

## 2023-11-13 DIAGNOSIS — M159 Polyosteoarthritis, unspecified: Secondary | ICD-10-CM | POA: Diagnosis not present

## 2023-11-15 DIAGNOSIS — E876 Hypokalemia: Secondary | ICD-10-CM | POA: Diagnosis not present

## 2023-11-15 DIAGNOSIS — N2581 Secondary hyperparathyroidism of renal origin: Secondary | ICD-10-CM | POA: Diagnosis not present

## 2023-11-15 DIAGNOSIS — D631 Anemia in chronic kidney disease: Secondary | ICD-10-CM | POA: Diagnosis not present

## 2023-11-15 DIAGNOSIS — Z992 Dependence on renal dialysis: Secondary | ICD-10-CM | POA: Diagnosis not present

## 2023-11-15 DIAGNOSIS — N186 End stage renal disease: Secondary | ICD-10-CM | POA: Diagnosis not present

## 2023-11-18 DIAGNOSIS — E876 Hypokalemia: Secondary | ICD-10-CM | POA: Diagnosis not present

## 2023-11-18 DIAGNOSIS — N2581 Secondary hyperparathyroidism of renal origin: Secondary | ICD-10-CM | POA: Diagnosis not present

## 2023-11-18 DIAGNOSIS — Z992 Dependence on renal dialysis: Secondary | ICD-10-CM | POA: Diagnosis not present

## 2023-11-18 DIAGNOSIS — N186 End stage renal disease: Secondary | ICD-10-CM | POA: Diagnosis not present

## 2023-11-18 DIAGNOSIS — D631 Anemia in chronic kidney disease: Secondary | ICD-10-CM | POA: Diagnosis not present

## 2023-11-20 DIAGNOSIS — E876 Hypokalemia: Secondary | ICD-10-CM | POA: Diagnosis not present

## 2023-11-20 DIAGNOSIS — N186 End stage renal disease: Secondary | ICD-10-CM | POA: Diagnosis not present

## 2023-11-20 DIAGNOSIS — N2581 Secondary hyperparathyroidism of renal origin: Secondary | ICD-10-CM | POA: Diagnosis not present

## 2023-11-20 DIAGNOSIS — Z992 Dependence on renal dialysis: Secondary | ICD-10-CM | POA: Diagnosis not present

## 2023-11-20 DIAGNOSIS — D631 Anemia in chronic kidney disease: Secondary | ICD-10-CM | POA: Diagnosis not present

## 2023-11-22 DIAGNOSIS — D631 Anemia in chronic kidney disease: Secondary | ICD-10-CM | POA: Diagnosis not present

## 2023-11-22 DIAGNOSIS — E876 Hypokalemia: Secondary | ICD-10-CM | POA: Diagnosis not present

## 2023-11-22 DIAGNOSIS — N2581 Secondary hyperparathyroidism of renal origin: Secondary | ICD-10-CM | POA: Diagnosis not present

## 2023-11-22 DIAGNOSIS — N186 End stage renal disease: Secondary | ICD-10-CM | POA: Diagnosis not present

## 2023-11-22 DIAGNOSIS — Z992 Dependence on renal dialysis: Secondary | ICD-10-CM | POA: Diagnosis not present

## 2023-11-25 DIAGNOSIS — N186 End stage renal disease: Secondary | ICD-10-CM | POA: Diagnosis not present

## 2023-11-25 DIAGNOSIS — Z992 Dependence on renal dialysis: Secondary | ICD-10-CM | POA: Diagnosis not present

## 2023-11-25 DIAGNOSIS — D631 Anemia in chronic kidney disease: Secondary | ICD-10-CM | POA: Diagnosis not present

## 2023-11-25 DIAGNOSIS — E876 Hypokalemia: Secondary | ICD-10-CM | POA: Diagnosis not present

## 2023-11-25 DIAGNOSIS — N2581 Secondary hyperparathyroidism of renal origin: Secondary | ICD-10-CM | POA: Diagnosis not present

## 2023-11-27 DIAGNOSIS — E876 Hypokalemia: Secondary | ICD-10-CM | POA: Diagnosis not present

## 2023-11-27 DIAGNOSIS — N186 End stage renal disease: Secondary | ICD-10-CM | POA: Diagnosis not present

## 2023-11-27 DIAGNOSIS — N2581 Secondary hyperparathyroidism of renal origin: Secondary | ICD-10-CM | POA: Diagnosis not present

## 2023-11-27 DIAGNOSIS — Z992 Dependence on renal dialysis: Secondary | ICD-10-CM | POA: Diagnosis not present

## 2023-11-27 DIAGNOSIS — D631 Anemia in chronic kidney disease: Secondary | ICD-10-CM | POA: Diagnosis not present

## 2023-12-02 DIAGNOSIS — T8610 Unspecified complication of kidney transplant: Secondary | ICD-10-CM | POA: Diagnosis not present

## 2023-12-02 DIAGNOSIS — Z992 Dependence on renal dialysis: Secondary | ICD-10-CM | POA: Diagnosis not present

## 2023-12-02 DIAGNOSIS — N186 End stage renal disease: Secondary | ICD-10-CM | POA: Diagnosis not present

## 2023-12-02 DIAGNOSIS — E876 Hypokalemia: Secondary | ICD-10-CM | POA: Diagnosis not present

## 2023-12-02 DIAGNOSIS — D631 Anemia in chronic kidney disease: Secondary | ICD-10-CM | POA: Diagnosis not present

## 2023-12-02 DIAGNOSIS — N2581 Secondary hyperparathyroidism of renal origin: Secondary | ICD-10-CM | POA: Diagnosis not present

## 2023-12-05 DIAGNOSIS — J019 Acute sinusitis, unspecified: Secondary | ICD-10-CM | POA: Diagnosis not present

## 2023-12-05 DIAGNOSIS — E876 Hypokalemia: Secondary | ICD-10-CM | POA: Diagnosis not present

## 2023-12-05 DIAGNOSIS — Z992 Dependence on renal dialysis: Secondary | ICD-10-CM | POA: Diagnosis not present

## 2023-12-05 DIAGNOSIS — R0981 Nasal congestion: Secondary | ICD-10-CM | POA: Diagnosis not present

## 2023-12-05 DIAGNOSIS — R051 Acute cough: Secondary | ICD-10-CM | POA: Diagnosis not present

## 2023-12-05 DIAGNOSIS — D631 Anemia in chronic kidney disease: Secondary | ICD-10-CM | POA: Diagnosis not present

## 2023-12-05 DIAGNOSIS — N186 End stage renal disease: Secondary | ICD-10-CM | POA: Diagnosis not present

## 2023-12-05 DIAGNOSIS — N2581 Secondary hyperparathyroidism of renal origin: Secondary | ICD-10-CM | POA: Diagnosis not present

## 2023-12-06 DIAGNOSIS — E876 Hypokalemia: Secondary | ICD-10-CM | POA: Diagnosis not present

## 2023-12-06 DIAGNOSIS — Z992 Dependence on renal dialysis: Secondary | ICD-10-CM | POA: Diagnosis not present

## 2023-12-06 DIAGNOSIS — N2581 Secondary hyperparathyroidism of renal origin: Secondary | ICD-10-CM | POA: Diagnosis not present

## 2023-12-06 DIAGNOSIS — D631 Anemia in chronic kidney disease: Secondary | ICD-10-CM | POA: Diagnosis not present

## 2023-12-06 DIAGNOSIS — N186 End stage renal disease: Secondary | ICD-10-CM | POA: Diagnosis not present

## 2023-12-09 DIAGNOSIS — N2581 Secondary hyperparathyroidism of renal origin: Secondary | ICD-10-CM | POA: Diagnosis not present

## 2023-12-09 DIAGNOSIS — N186 End stage renal disease: Secondary | ICD-10-CM | POA: Diagnosis not present

## 2023-12-09 DIAGNOSIS — Z992 Dependence on renal dialysis: Secondary | ICD-10-CM | POA: Diagnosis not present

## 2023-12-09 DIAGNOSIS — E876 Hypokalemia: Secondary | ICD-10-CM | POA: Diagnosis not present

## 2023-12-09 DIAGNOSIS — D631 Anemia in chronic kidney disease: Secondary | ICD-10-CM | POA: Diagnosis not present

## 2023-12-10 DIAGNOSIS — H6123 Impacted cerumen, bilateral: Secondary | ICD-10-CM | POA: Diagnosis not present

## 2023-12-11 DIAGNOSIS — M109 Gout, unspecified: Secondary | ICD-10-CM | POA: Diagnosis not present

## 2023-12-11 DIAGNOSIS — N186 End stage renal disease: Secondary | ICD-10-CM | POA: Diagnosis not present

## 2023-12-11 DIAGNOSIS — K219 Gastro-esophageal reflux disease without esophagitis: Secondary | ICD-10-CM | POA: Diagnosis not present

## 2023-12-11 DIAGNOSIS — D649 Anemia, unspecified: Secondary | ICD-10-CM | POA: Diagnosis not present

## 2023-12-11 DIAGNOSIS — Z992 Dependence on renal dialysis: Secondary | ICD-10-CM | POA: Diagnosis not present

## 2023-12-11 DIAGNOSIS — M159 Polyosteoarthritis, unspecified: Secondary | ICD-10-CM | POA: Diagnosis not present

## 2023-12-11 DIAGNOSIS — I10 Essential (primary) hypertension: Secondary | ICD-10-CM | POA: Diagnosis not present

## 2023-12-13 DIAGNOSIS — E876 Hypokalemia: Secondary | ICD-10-CM | POA: Diagnosis not present

## 2023-12-13 DIAGNOSIS — D631 Anemia in chronic kidney disease: Secondary | ICD-10-CM | POA: Diagnosis not present

## 2023-12-13 DIAGNOSIS — N186 End stage renal disease: Secondary | ICD-10-CM | POA: Diagnosis not present

## 2023-12-13 DIAGNOSIS — Z992 Dependence on renal dialysis: Secondary | ICD-10-CM | POA: Diagnosis not present

## 2023-12-13 DIAGNOSIS — N2581 Secondary hyperparathyroidism of renal origin: Secondary | ICD-10-CM | POA: Diagnosis not present

## 2023-12-16 DIAGNOSIS — N2581 Secondary hyperparathyroidism of renal origin: Secondary | ICD-10-CM | POA: Diagnosis not present

## 2023-12-16 DIAGNOSIS — D631 Anemia in chronic kidney disease: Secondary | ICD-10-CM | POA: Diagnosis not present

## 2023-12-16 DIAGNOSIS — N186 End stage renal disease: Secondary | ICD-10-CM | POA: Diagnosis not present

## 2023-12-16 DIAGNOSIS — Z992 Dependence on renal dialysis: Secondary | ICD-10-CM | POA: Diagnosis not present

## 2023-12-16 DIAGNOSIS — E876 Hypokalemia: Secondary | ICD-10-CM | POA: Diagnosis not present

## 2023-12-18 DIAGNOSIS — E876 Hypokalemia: Secondary | ICD-10-CM | POA: Diagnosis not present

## 2023-12-18 DIAGNOSIS — D631 Anemia in chronic kidney disease: Secondary | ICD-10-CM | POA: Diagnosis not present

## 2023-12-18 DIAGNOSIS — N186 End stage renal disease: Secondary | ICD-10-CM | POA: Diagnosis not present

## 2023-12-18 DIAGNOSIS — Z992 Dependence on renal dialysis: Secondary | ICD-10-CM | POA: Diagnosis not present

## 2023-12-18 DIAGNOSIS — N2581 Secondary hyperparathyroidism of renal origin: Secondary | ICD-10-CM | POA: Diagnosis not present

## 2023-12-19 DIAGNOSIS — M25562 Pain in left knee: Secondary | ICD-10-CM | POA: Diagnosis not present

## 2023-12-19 DIAGNOSIS — S8392XA Sprain of unspecified site of left knee, initial encounter: Secondary | ICD-10-CM | POA: Diagnosis not present

## 2023-12-20 DIAGNOSIS — N186 End stage renal disease: Secondary | ICD-10-CM | POA: Diagnosis not present

## 2023-12-20 DIAGNOSIS — E876 Hypokalemia: Secondary | ICD-10-CM | POA: Diagnosis not present

## 2023-12-20 DIAGNOSIS — Z992 Dependence on renal dialysis: Secondary | ICD-10-CM | POA: Diagnosis not present

## 2023-12-20 DIAGNOSIS — N2581 Secondary hyperparathyroidism of renal origin: Secondary | ICD-10-CM | POA: Diagnosis not present

## 2023-12-20 DIAGNOSIS — D631 Anemia in chronic kidney disease: Secondary | ICD-10-CM | POA: Diagnosis not present

## 2023-12-23 DIAGNOSIS — Z992 Dependence on renal dialysis: Secondary | ICD-10-CM | POA: Diagnosis not present

## 2023-12-23 DIAGNOSIS — N186 End stage renal disease: Secondary | ICD-10-CM | POA: Diagnosis not present

## 2023-12-23 DIAGNOSIS — N2581 Secondary hyperparathyroidism of renal origin: Secondary | ICD-10-CM | POA: Diagnosis not present

## 2023-12-23 DIAGNOSIS — M25562 Pain in left knee: Secondary | ICD-10-CM | POA: Diagnosis not present

## 2023-12-23 DIAGNOSIS — D631 Anemia in chronic kidney disease: Secondary | ICD-10-CM | POA: Diagnosis not present

## 2023-12-23 DIAGNOSIS — E876 Hypokalemia: Secondary | ICD-10-CM | POA: Diagnosis not present

## 2023-12-23 DIAGNOSIS — G8929 Other chronic pain: Secondary | ICD-10-CM | POA: Diagnosis not present

## 2023-12-25 DIAGNOSIS — N186 End stage renal disease: Secondary | ICD-10-CM | POA: Diagnosis not present

## 2023-12-25 DIAGNOSIS — Z992 Dependence on renal dialysis: Secondary | ICD-10-CM | POA: Diagnosis not present

## 2023-12-25 DIAGNOSIS — N2581 Secondary hyperparathyroidism of renal origin: Secondary | ICD-10-CM | POA: Diagnosis not present

## 2023-12-25 DIAGNOSIS — D631 Anemia in chronic kidney disease: Secondary | ICD-10-CM | POA: Diagnosis not present

## 2023-12-25 DIAGNOSIS — E876 Hypokalemia: Secondary | ICD-10-CM | POA: Diagnosis not present

## 2023-12-27 DIAGNOSIS — N2581 Secondary hyperparathyroidism of renal origin: Secondary | ICD-10-CM | POA: Diagnosis not present

## 2023-12-27 DIAGNOSIS — D631 Anemia in chronic kidney disease: Secondary | ICD-10-CM | POA: Diagnosis not present

## 2023-12-27 DIAGNOSIS — Z992 Dependence on renal dialysis: Secondary | ICD-10-CM | POA: Diagnosis not present

## 2023-12-27 DIAGNOSIS — E876 Hypokalemia: Secondary | ICD-10-CM | POA: Diagnosis not present

## 2023-12-27 DIAGNOSIS — N186 End stage renal disease: Secondary | ICD-10-CM | POA: Diagnosis not present

## 2023-12-30 DIAGNOSIS — N2581 Secondary hyperparathyroidism of renal origin: Secondary | ICD-10-CM | POA: Diagnosis not present

## 2023-12-30 DIAGNOSIS — E876 Hypokalemia: Secondary | ICD-10-CM | POA: Diagnosis not present

## 2023-12-30 DIAGNOSIS — D631 Anemia in chronic kidney disease: Secondary | ICD-10-CM | POA: Diagnosis not present

## 2023-12-30 DIAGNOSIS — Z992 Dependence on renal dialysis: Secondary | ICD-10-CM | POA: Diagnosis not present

## 2023-12-30 DIAGNOSIS — N186 End stage renal disease: Secondary | ICD-10-CM | POA: Diagnosis not present

## 2024-01-01 DIAGNOSIS — N186 End stage renal disease: Secondary | ICD-10-CM | POA: Diagnosis not present

## 2024-01-01 DIAGNOSIS — Z992 Dependence on renal dialysis: Secondary | ICD-10-CM | POA: Diagnosis not present

## 2024-01-01 DIAGNOSIS — T8610 Unspecified complication of kidney transplant: Secondary | ICD-10-CM | POA: Diagnosis not present

## 2024-01-01 DIAGNOSIS — E876 Hypokalemia: Secondary | ICD-10-CM | POA: Diagnosis not present

## 2024-01-01 DIAGNOSIS — D631 Anemia in chronic kidney disease: Secondary | ICD-10-CM | POA: Diagnosis not present

## 2024-01-01 DIAGNOSIS — N2581 Secondary hyperparathyroidism of renal origin: Secondary | ICD-10-CM | POA: Diagnosis not present

## 2024-01-03 DIAGNOSIS — M47812 Spondylosis without myelopathy or radiculopathy, cervical region: Secondary | ICD-10-CM | POA: Diagnosis not present

## 2024-01-03 DIAGNOSIS — Z992 Dependence on renal dialysis: Secondary | ICD-10-CM | POA: Diagnosis not present

## 2024-01-03 DIAGNOSIS — D631 Anemia in chronic kidney disease: Secondary | ICD-10-CM | POA: Diagnosis not present

## 2024-01-03 DIAGNOSIS — E876 Hypokalemia: Secondary | ICD-10-CM | POA: Diagnosis not present

## 2024-01-03 DIAGNOSIS — N186 End stage renal disease: Secondary | ICD-10-CM | POA: Diagnosis not present

## 2024-01-03 DIAGNOSIS — N2581 Secondary hyperparathyroidism of renal origin: Secondary | ICD-10-CM | POA: Diagnosis not present

## 2024-01-04 DIAGNOSIS — Z992 Dependence on renal dialysis: Secondary | ICD-10-CM | POA: Diagnosis not present

## 2024-01-04 DIAGNOSIS — N186 End stage renal disease: Secondary | ICD-10-CM | POA: Diagnosis not present

## 2024-01-04 DIAGNOSIS — N2581 Secondary hyperparathyroidism of renal origin: Secondary | ICD-10-CM | POA: Diagnosis not present

## 2024-01-06 DIAGNOSIS — N2581 Secondary hyperparathyroidism of renal origin: Secondary | ICD-10-CM | POA: Diagnosis not present

## 2024-01-06 DIAGNOSIS — N186 End stage renal disease: Secondary | ICD-10-CM | POA: Diagnosis not present

## 2024-01-06 DIAGNOSIS — E876 Hypokalemia: Secondary | ICD-10-CM | POA: Diagnosis not present

## 2024-01-06 DIAGNOSIS — Z992 Dependence on renal dialysis: Secondary | ICD-10-CM | POA: Diagnosis not present

## 2024-01-06 DIAGNOSIS — D631 Anemia in chronic kidney disease: Secondary | ICD-10-CM | POA: Diagnosis not present

## 2024-01-07 DIAGNOSIS — M25562 Pain in left knee: Secondary | ICD-10-CM | POA: Diagnosis not present

## 2024-01-07 DIAGNOSIS — G8929 Other chronic pain: Secondary | ICD-10-CM | POA: Diagnosis not present

## 2024-01-08 DIAGNOSIS — Z992 Dependence on renal dialysis: Secondary | ICD-10-CM | POA: Diagnosis not present

## 2024-01-08 DIAGNOSIS — D631 Anemia in chronic kidney disease: Secondary | ICD-10-CM | POA: Diagnosis not present

## 2024-01-08 DIAGNOSIS — M109 Gout, unspecified: Secondary | ICD-10-CM | POA: Diagnosis not present

## 2024-01-08 DIAGNOSIS — N186 End stage renal disease: Secondary | ICD-10-CM | POA: Diagnosis not present

## 2024-01-08 DIAGNOSIS — M159 Polyosteoarthritis, unspecified: Secondary | ICD-10-CM | POA: Diagnosis not present

## 2024-01-08 DIAGNOSIS — K219 Gastro-esophageal reflux disease without esophagitis: Secondary | ICD-10-CM | POA: Diagnosis not present

## 2024-01-08 DIAGNOSIS — E876 Hypokalemia: Secondary | ICD-10-CM | POA: Diagnosis not present

## 2024-01-08 DIAGNOSIS — N2581 Secondary hyperparathyroidism of renal origin: Secondary | ICD-10-CM | POA: Diagnosis not present

## 2024-01-08 DIAGNOSIS — I10 Essential (primary) hypertension: Secondary | ICD-10-CM | POA: Diagnosis not present

## 2024-01-08 DIAGNOSIS — D649 Anemia, unspecified: Secondary | ICD-10-CM | POA: Diagnosis not present

## 2024-01-10 DIAGNOSIS — D631 Anemia in chronic kidney disease: Secondary | ICD-10-CM | POA: Diagnosis not present

## 2024-01-10 DIAGNOSIS — N2581 Secondary hyperparathyroidism of renal origin: Secondary | ICD-10-CM | POA: Diagnosis not present

## 2024-01-10 DIAGNOSIS — E876 Hypokalemia: Secondary | ICD-10-CM | POA: Diagnosis not present

## 2024-01-10 DIAGNOSIS — N186 End stage renal disease: Secondary | ICD-10-CM | POA: Diagnosis not present

## 2024-01-10 DIAGNOSIS — Z992 Dependence on renal dialysis: Secondary | ICD-10-CM | POA: Diagnosis not present

## 2024-01-13 DIAGNOSIS — D631 Anemia in chronic kidney disease: Secondary | ICD-10-CM | POA: Diagnosis not present

## 2024-01-13 DIAGNOSIS — N2581 Secondary hyperparathyroidism of renal origin: Secondary | ICD-10-CM | POA: Diagnosis not present

## 2024-01-13 DIAGNOSIS — N186 End stage renal disease: Secondary | ICD-10-CM | POA: Diagnosis not present

## 2024-01-13 DIAGNOSIS — Z992 Dependence on renal dialysis: Secondary | ICD-10-CM | POA: Diagnosis not present

## 2024-01-13 DIAGNOSIS — E876 Hypokalemia: Secondary | ICD-10-CM | POA: Diagnosis not present

## 2024-01-14 DIAGNOSIS — R935 Abnormal findings on diagnostic imaging of other abdominal regions, including retroperitoneum: Secondary | ICD-10-CM | POA: Diagnosis not present

## 2024-01-14 DIAGNOSIS — K7401 Hepatic fibrosis, early fibrosis: Secondary | ICD-10-CM | POA: Diagnosis not present

## 2024-01-14 DIAGNOSIS — Z992 Dependence on renal dialysis: Secondary | ICD-10-CM | POA: Diagnosis not present

## 2024-01-14 DIAGNOSIS — N186 End stage renal disease: Secondary | ICD-10-CM | POA: Diagnosis not present

## 2024-01-14 DIAGNOSIS — D631 Anemia in chronic kidney disease: Secondary | ICD-10-CM | POA: Diagnosis not present

## 2024-01-14 DIAGNOSIS — K7400 Hepatic fibrosis, unspecified: Secondary | ICD-10-CM | POA: Diagnosis not present

## 2024-01-14 DIAGNOSIS — Z7682 Awaiting organ transplant status: Secondary | ICD-10-CM | POA: Diagnosis not present

## 2024-01-14 DIAGNOSIS — K7689 Other specified diseases of liver: Secondary | ICD-10-CM | POA: Diagnosis not present

## 2024-01-14 DIAGNOSIS — R933 Abnormal findings on diagnostic imaging of other parts of digestive tract: Secondary | ICD-10-CM | POA: Diagnosis not present

## 2024-01-14 DIAGNOSIS — K765 Hepatic veno-occlusive disease: Secondary | ICD-10-CM | POA: Diagnosis not present

## 2024-01-14 DIAGNOSIS — I120 Hypertensive chronic kidney disease with stage 5 chronic kidney disease or end stage renal disease: Secondary | ICD-10-CM | POA: Diagnosis not present

## 2024-01-14 DIAGNOSIS — K869 Disease of pancreas, unspecified: Secondary | ICD-10-CM | POA: Diagnosis not present

## 2024-01-14 DIAGNOSIS — R748 Abnormal levels of other serum enzymes: Secondary | ICD-10-CM | POA: Diagnosis not present

## 2024-01-16 DIAGNOSIS — Z992 Dependence on renal dialysis: Secondary | ICD-10-CM | POA: Diagnosis not present

## 2024-01-16 DIAGNOSIS — D631 Anemia in chronic kidney disease: Secondary | ICD-10-CM | POA: Diagnosis not present

## 2024-01-16 DIAGNOSIS — N2581 Secondary hyperparathyroidism of renal origin: Secondary | ICD-10-CM | POA: Diagnosis not present

## 2024-01-16 DIAGNOSIS — N186 End stage renal disease: Secondary | ICD-10-CM | POA: Diagnosis not present

## 2024-01-16 DIAGNOSIS — E876 Hypokalemia: Secondary | ICD-10-CM | POA: Diagnosis not present

## 2024-01-17 DIAGNOSIS — E876 Hypokalemia: Secondary | ICD-10-CM | POA: Diagnosis not present

## 2024-01-17 DIAGNOSIS — N186 End stage renal disease: Secondary | ICD-10-CM | POA: Diagnosis not present

## 2024-01-17 DIAGNOSIS — N2581 Secondary hyperparathyroidism of renal origin: Secondary | ICD-10-CM | POA: Diagnosis not present

## 2024-01-17 DIAGNOSIS — D631 Anemia in chronic kidney disease: Secondary | ICD-10-CM | POA: Diagnosis not present

## 2024-01-17 DIAGNOSIS — Z992 Dependence on renal dialysis: Secondary | ICD-10-CM | POA: Diagnosis not present

## 2024-01-20 DIAGNOSIS — N186 End stage renal disease: Secondary | ICD-10-CM | POA: Diagnosis not present

## 2024-01-20 DIAGNOSIS — E876 Hypokalemia: Secondary | ICD-10-CM | POA: Diagnosis not present

## 2024-01-20 DIAGNOSIS — D631 Anemia in chronic kidney disease: Secondary | ICD-10-CM | POA: Diagnosis not present

## 2024-01-20 DIAGNOSIS — Z992 Dependence on renal dialysis: Secondary | ICD-10-CM | POA: Diagnosis not present

## 2024-01-20 DIAGNOSIS — N2581 Secondary hyperparathyroidism of renal origin: Secondary | ICD-10-CM | POA: Diagnosis not present

## 2024-01-22 DIAGNOSIS — Z992 Dependence on renal dialysis: Secondary | ICD-10-CM | POA: Diagnosis not present

## 2024-01-22 DIAGNOSIS — D631 Anemia in chronic kidney disease: Secondary | ICD-10-CM | POA: Diagnosis not present

## 2024-01-22 DIAGNOSIS — N186 End stage renal disease: Secondary | ICD-10-CM | POA: Diagnosis not present

## 2024-01-22 DIAGNOSIS — N2581 Secondary hyperparathyroidism of renal origin: Secondary | ICD-10-CM | POA: Diagnosis not present

## 2024-01-22 DIAGNOSIS — E876 Hypokalemia: Secondary | ICD-10-CM | POA: Diagnosis not present

## 2024-01-24 DIAGNOSIS — E876 Hypokalemia: Secondary | ICD-10-CM | POA: Diagnosis not present

## 2024-01-24 DIAGNOSIS — N186 End stage renal disease: Secondary | ICD-10-CM | POA: Diagnosis not present

## 2024-01-24 DIAGNOSIS — Z992 Dependence on renal dialysis: Secondary | ICD-10-CM | POA: Diagnosis not present

## 2024-01-24 DIAGNOSIS — D631 Anemia in chronic kidney disease: Secondary | ICD-10-CM | POA: Diagnosis not present

## 2024-01-24 DIAGNOSIS — N2581 Secondary hyperparathyroidism of renal origin: Secondary | ICD-10-CM | POA: Diagnosis not present

## 2024-01-28 DIAGNOSIS — N2581 Secondary hyperparathyroidism of renal origin: Secondary | ICD-10-CM | POA: Diagnosis not present

## 2024-01-28 DIAGNOSIS — D689 Coagulation defect, unspecified: Secondary | ICD-10-CM | POA: Diagnosis not present

## 2024-01-28 DIAGNOSIS — N186 End stage renal disease: Secondary | ICD-10-CM | POA: Diagnosis not present

## 2024-01-28 DIAGNOSIS — Z992 Dependence on renal dialysis: Secondary | ICD-10-CM | POA: Diagnosis not present

## 2024-01-30 DIAGNOSIS — D689 Coagulation defect, unspecified: Secondary | ICD-10-CM | POA: Diagnosis not present

## 2024-01-30 DIAGNOSIS — N186 End stage renal disease: Secondary | ICD-10-CM | POA: Diagnosis not present

## 2024-01-30 DIAGNOSIS — Z992 Dependence on renal dialysis: Secondary | ICD-10-CM | POA: Diagnosis not present

## 2024-01-30 DIAGNOSIS — N2581 Secondary hyperparathyroidism of renal origin: Secondary | ICD-10-CM | POA: Diagnosis not present

## 2024-02-01 DIAGNOSIS — T8610 Unspecified complication of kidney transplant: Secondary | ICD-10-CM | POA: Diagnosis not present

## 2024-02-01 DIAGNOSIS — N186 End stage renal disease: Secondary | ICD-10-CM | POA: Diagnosis not present

## 2024-02-01 DIAGNOSIS — Z992 Dependence on renal dialysis: Secondary | ICD-10-CM | POA: Diagnosis not present

## 2024-02-03 DIAGNOSIS — E876 Hypokalemia: Secondary | ICD-10-CM | POA: Diagnosis not present

## 2024-02-03 DIAGNOSIS — N2581 Secondary hyperparathyroidism of renal origin: Secondary | ICD-10-CM | POA: Diagnosis not present

## 2024-02-03 DIAGNOSIS — N186 End stage renal disease: Secondary | ICD-10-CM | POA: Diagnosis not present

## 2024-02-03 DIAGNOSIS — Z992 Dependence on renal dialysis: Secondary | ICD-10-CM | POA: Diagnosis not present

## 2024-02-03 DIAGNOSIS — D631 Anemia in chronic kidney disease: Secondary | ICD-10-CM | POA: Diagnosis not present

## 2024-02-04 DIAGNOSIS — N10 Acute tubulo-interstitial nephritis: Secondary | ICD-10-CM | POA: Diagnosis not present

## 2024-02-04 DIAGNOSIS — H52223 Regular astigmatism, bilateral: Secondary | ICD-10-CM | POA: Diagnosis not present

## 2024-02-04 DIAGNOSIS — K859 Acute pancreatitis without necrosis or infection, unspecified: Secondary | ICD-10-CM | POA: Diagnosis not present

## 2024-02-04 DIAGNOSIS — K651 Peritoneal abscess: Secondary | ICD-10-CM | POA: Diagnosis not present

## 2024-02-04 DIAGNOSIS — Z79899 Other long term (current) drug therapy: Secondary | ICD-10-CM | POA: Diagnosis not present

## 2024-02-04 DIAGNOSIS — R1084 Generalized abdominal pain: Secondary | ICD-10-CM | POA: Diagnosis not present

## 2024-02-04 DIAGNOSIS — R9431 Abnormal electrocardiogram [ECG] [EKG]: Secondary | ICD-10-CM | POA: Diagnosis not present

## 2024-02-04 DIAGNOSIS — Z7401 Bed confinement status: Secondary | ICD-10-CM | POA: Diagnosis not present

## 2024-02-04 DIAGNOSIS — R1032 Left lower quadrant pain: Secondary | ICD-10-CM | POA: Diagnosis not present

## 2024-02-04 DIAGNOSIS — R110 Nausea: Secondary | ICD-10-CM | POA: Diagnosis not present

## 2024-02-04 DIAGNOSIS — H47011 Ischemic optic neuropathy, right eye: Secondary | ICD-10-CM | POA: Diagnosis not present

## 2024-02-05 DIAGNOSIS — E8809 Other disorders of plasma-protein metabolism, not elsewhere classified: Secondary | ICD-10-CM | POA: Diagnosis not present

## 2024-02-05 DIAGNOSIS — K859 Acute pancreatitis without necrosis or infection, unspecified: Secondary | ICD-10-CM | POA: Diagnosis not present

## 2024-02-05 DIAGNOSIS — E871 Hypo-osmolality and hyponatremia: Secondary | ICD-10-CM | POA: Diagnosis not present

## 2024-02-05 DIAGNOSIS — Z992 Dependence on renal dialysis: Secondary | ICD-10-CM | POA: Diagnosis not present

## 2024-02-05 DIAGNOSIS — Z7952 Long term (current) use of systemic steroids: Secondary | ICD-10-CM | POA: Diagnosis not present

## 2024-02-05 DIAGNOSIS — K651 Peritoneal abscess: Secondary | ICD-10-CM | POA: Diagnosis not present

## 2024-02-05 DIAGNOSIS — R059 Cough, unspecified: Secondary | ICD-10-CM | POA: Diagnosis not present

## 2024-02-05 DIAGNOSIS — I120 Hypertensive chronic kidney disease with stage 5 chronic kidney disease or end stage renal disease: Secondary | ICD-10-CM | POA: Diagnosis not present

## 2024-02-05 DIAGNOSIS — E039 Hypothyroidism, unspecified: Secondary | ICD-10-CM | POA: Diagnosis not present

## 2024-02-05 DIAGNOSIS — M1A9XX0 Chronic gout, unspecified, without tophus (tophi): Secondary | ICD-10-CM | POA: Diagnosis not present

## 2024-02-05 DIAGNOSIS — Z881 Allergy status to other antibiotic agents status: Secondary | ICD-10-CM | POA: Diagnosis not present

## 2024-02-05 DIAGNOSIS — I471 Supraventricular tachycardia, unspecified: Secondary | ICD-10-CM | POA: Diagnosis not present

## 2024-02-05 DIAGNOSIS — K580 Irritable bowel syndrome with diarrhea: Secondary | ICD-10-CM | POA: Diagnosis not present

## 2024-02-05 DIAGNOSIS — E785 Hyperlipidemia, unspecified: Secondary | ICD-10-CM | POA: Diagnosis not present

## 2024-02-05 DIAGNOSIS — N186 End stage renal disease: Secondary | ICD-10-CM | POA: Diagnosis not present

## 2024-02-05 DIAGNOSIS — Z85048 Personal history of other malignant neoplasm of rectum, rectosigmoid junction, and anus: Secondary | ICD-10-CM | POA: Diagnosis not present

## 2024-02-05 DIAGNOSIS — K219 Gastro-esophageal reflux disease without esophagitis: Secondary | ICD-10-CM | POA: Diagnosis not present

## 2024-02-05 DIAGNOSIS — D631 Anemia in chronic kidney disease: Secondary | ICD-10-CM | POA: Diagnosis not present

## 2024-02-05 DIAGNOSIS — T8612 Kidney transplant failure: Secondary | ICD-10-CM | POA: Diagnosis not present

## 2024-02-07 DIAGNOSIS — K651 Peritoneal abscess: Secondary | ICD-10-CM | POA: Diagnosis not present

## 2024-02-08 ENCOUNTER — Other Ambulatory Visit: Payer: Self-pay | Admitting: Gastroenterology

## 2024-02-10 DIAGNOSIS — N2581 Secondary hyperparathyroidism of renal origin: Secondary | ICD-10-CM | POA: Diagnosis not present

## 2024-02-10 DIAGNOSIS — D631 Anemia in chronic kidney disease: Secondary | ICD-10-CM | POA: Diagnosis not present

## 2024-02-10 DIAGNOSIS — Z992 Dependence on renal dialysis: Secondary | ICD-10-CM | POA: Diagnosis not present

## 2024-02-10 DIAGNOSIS — E876 Hypokalemia: Secondary | ICD-10-CM | POA: Diagnosis not present

## 2024-02-10 DIAGNOSIS — N186 End stage renal disease: Secondary | ICD-10-CM | POA: Diagnosis not present

## 2024-02-12 DIAGNOSIS — E876 Hypokalemia: Secondary | ICD-10-CM | POA: Diagnosis not present

## 2024-02-12 DIAGNOSIS — N2581 Secondary hyperparathyroidism of renal origin: Secondary | ICD-10-CM | POA: Diagnosis not present

## 2024-02-12 DIAGNOSIS — D631 Anemia in chronic kidney disease: Secondary | ICD-10-CM | POA: Diagnosis not present

## 2024-02-12 DIAGNOSIS — N186 End stage renal disease: Secondary | ICD-10-CM | POA: Diagnosis not present

## 2024-02-12 DIAGNOSIS — Z992 Dependence on renal dialysis: Secondary | ICD-10-CM | POA: Diagnosis not present

## 2024-02-14 DIAGNOSIS — E8809 Other disorders of plasma-protein metabolism, not elsewhere classified: Secondary | ICD-10-CM | POA: Diagnosis not present

## 2024-02-14 DIAGNOSIS — L0291 Cutaneous abscess, unspecified: Secondary | ICD-10-CM | POA: Diagnosis not present

## 2024-02-14 DIAGNOSIS — Z4803 Encounter for change or removal of drains: Secondary | ICD-10-CM | POA: Diagnosis not present

## 2024-02-14 DIAGNOSIS — E871 Hypo-osmolality and hyponatremia: Secondary | ICD-10-CM | POA: Diagnosis not present

## 2024-02-14 DIAGNOSIS — D631 Anemia in chronic kidney disease: Secondary | ICD-10-CM | POA: Diagnosis not present

## 2024-02-14 DIAGNOSIS — N186 End stage renal disease: Secondary | ICD-10-CM | POA: Diagnosis not present

## 2024-02-14 DIAGNOSIS — I120 Hypertensive chronic kidney disease with stage 5 chronic kidney disease or end stage renal disease: Secondary | ICD-10-CM | POA: Diagnosis not present

## 2024-02-14 DIAGNOSIS — Z992 Dependence on renal dialysis: Secondary | ICD-10-CM | POA: Diagnosis not present

## 2024-02-17 DIAGNOSIS — Z992 Dependence on renal dialysis: Secondary | ICD-10-CM | POA: Diagnosis not present

## 2024-02-17 DIAGNOSIS — M109 Gout, unspecified: Secondary | ICD-10-CM | POA: Diagnosis not present

## 2024-02-17 DIAGNOSIS — N186 End stage renal disease: Secondary | ICD-10-CM | POA: Diagnosis not present

## 2024-02-17 DIAGNOSIS — M159 Polyosteoarthritis, unspecified: Secondary | ICD-10-CM | POA: Diagnosis not present

## 2024-02-17 DIAGNOSIS — I10 Essential (primary) hypertension: Secondary | ICD-10-CM | POA: Diagnosis not present

## 2024-02-17 DIAGNOSIS — D649 Anemia, unspecified: Secondary | ICD-10-CM | POA: Diagnosis not present

## 2024-02-17 DIAGNOSIS — E876 Hypokalemia: Secondary | ICD-10-CM | POA: Diagnosis not present

## 2024-02-17 DIAGNOSIS — K219 Gastro-esophageal reflux disease without esophagitis: Secondary | ICD-10-CM | POA: Diagnosis not present

## 2024-02-17 DIAGNOSIS — D631 Anemia in chronic kidney disease: Secondary | ICD-10-CM | POA: Diagnosis not present

## 2024-02-17 DIAGNOSIS — N2581 Secondary hyperparathyroidism of renal origin: Secondary | ICD-10-CM | POA: Diagnosis not present

## 2024-02-19 DIAGNOSIS — Z992 Dependence on renal dialysis: Secondary | ICD-10-CM | POA: Diagnosis not present

## 2024-02-19 DIAGNOSIS — E876 Hypokalemia: Secondary | ICD-10-CM | POA: Diagnosis not present

## 2024-02-19 DIAGNOSIS — N186 End stage renal disease: Secondary | ICD-10-CM | POA: Diagnosis not present

## 2024-02-19 DIAGNOSIS — N2581 Secondary hyperparathyroidism of renal origin: Secondary | ICD-10-CM | POA: Diagnosis not present

## 2024-02-19 DIAGNOSIS — D631 Anemia in chronic kidney disease: Secondary | ICD-10-CM | POA: Diagnosis not present

## 2024-02-21 DIAGNOSIS — L0291 Cutaneous abscess, unspecified: Secondary | ICD-10-CM | POA: Diagnosis not present

## 2024-02-24 DIAGNOSIS — D631 Anemia in chronic kidney disease: Secondary | ICD-10-CM | POA: Diagnosis not present

## 2024-02-24 DIAGNOSIS — E876 Hypokalemia: Secondary | ICD-10-CM | POA: Diagnosis not present

## 2024-02-24 DIAGNOSIS — N186 End stage renal disease: Secondary | ICD-10-CM | POA: Diagnosis not present

## 2024-02-24 DIAGNOSIS — Z992 Dependence on renal dialysis: Secondary | ICD-10-CM | POA: Diagnosis not present

## 2024-02-24 DIAGNOSIS — N2581 Secondary hyperparathyroidism of renal origin: Secondary | ICD-10-CM | POA: Diagnosis not present

## 2024-02-25 DIAGNOSIS — G8929 Other chronic pain: Secondary | ICD-10-CM | POA: Diagnosis not present

## 2024-02-25 DIAGNOSIS — M25462 Effusion, left knee: Secondary | ICD-10-CM | POA: Diagnosis not present

## 2024-02-25 DIAGNOSIS — M25562 Pain in left knee: Secondary | ICD-10-CM | POA: Diagnosis not present

## 2024-02-26 DIAGNOSIS — D631 Anemia in chronic kidney disease: Secondary | ICD-10-CM | POA: Diagnosis not present

## 2024-02-26 DIAGNOSIS — Z01818 Encounter for other preprocedural examination: Secondary | ICD-10-CM | POA: Diagnosis not present

## 2024-02-26 DIAGNOSIS — Z992 Dependence on renal dialysis: Secondary | ICD-10-CM | POA: Diagnosis not present

## 2024-02-26 DIAGNOSIS — Z23 Encounter for immunization: Secondary | ICD-10-CM | POA: Diagnosis not present

## 2024-02-26 DIAGNOSIS — E876 Hypokalemia: Secondary | ICD-10-CM | POA: Diagnosis not present

## 2024-02-26 DIAGNOSIS — N2581 Secondary hyperparathyroidism of renal origin: Secondary | ICD-10-CM | POA: Diagnosis not present

## 2024-02-26 DIAGNOSIS — N186 End stage renal disease: Secondary | ICD-10-CM | POA: Diagnosis not present

## 2024-02-28 DIAGNOSIS — N2581 Secondary hyperparathyroidism of renal origin: Secondary | ICD-10-CM | POA: Diagnosis not present

## 2024-02-28 DIAGNOSIS — E876 Hypokalemia: Secondary | ICD-10-CM | POA: Diagnosis not present

## 2024-02-28 DIAGNOSIS — N186 End stage renal disease: Secondary | ICD-10-CM | POA: Diagnosis not present

## 2024-02-28 DIAGNOSIS — D631 Anemia in chronic kidney disease: Secondary | ICD-10-CM | POA: Diagnosis not present

## 2024-02-28 DIAGNOSIS — Z992 Dependence on renal dialysis: Secondary | ICD-10-CM | POA: Diagnosis not present

## 2024-03-02 DIAGNOSIS — N186 End stage renal disease: Secondary | ICD-10-CM | POA: Diagnosis not present

## 2024-03-02 DIAGNOSIS — Z992 Dependence on renal dialysis: Secondary | ICD-10-CM | POA: Diagnosis not present

## 2024-03-02 DIAGNOSIS — T8610 Unspecified complication of kidney transplant: Secondary | ICD-10-CM | POA: Diagnosis not present

## 2024-03-03 DIAGNOSIS — N2581 Secondary hyperparathyroidism of renal origin: Secondary | ICD-10-CM | POA: Diagnosis not present

## 2024-03-03 DIAGNOSIS — Z992 Dependence on renal dialysis: Secondary | ICD-10-CM | POA: Diagnosis not present

## 2024-03-03 DIAGNOSIS — D689 Coagulation defect, unspecified: Secondary | ICD-10-CM | POA: Diagnosis not present

## 2024-03-03 DIAGNOSIS — D631 Anemia in chronic kidney disease: Secondary | ICD-10-CM | POA: Diagnosis not present

## 2024-03-03 DIAGNOSIS — N186 End stage renal disease: Secondary | ICD-10-CM | POA: Diagnosis not present

## 2024-03-05 DIAGNOSIS — D689 Coagulation defect, unspecified: Secondary | ICD-10-CM | POA: Diagnosis not present

## 2024-03-05 DIAGNOSIS — N186 End stage renal disease: Secondary | ICD-10-CM | POA: Diagnosis not present

## 2024-03-05 DIAGNOSIS — D631 Anemia in chronic kidney disease: Secondary | ICD-10-CM | POA: Diagnosis not present

## 2024-03-05 DIAGNOSIS — Z992 Dependence on renal dialysis: Secondary | ICD-10-CM | POA: Diagnosis not present

## 2024-03-05 DIAGNOSIS — N2581 Secondary hyperparathyroidism of renal origin: Secondary | ICD-10-CM | POA: Diagnosis not present

## 2024-03-07 DIAGNOSIS — N2581 Secondary hyperparathyroidism of renal origin: Secondary | ICD-10-CM | POA: Diagnosis not present

## 2024-03-07 DIAGNOSIS — N186 End stage renal disease: Secondary | ICD-10-CM | POA: Diagnosis not present

## 2024-03-07 DIAGNOSIS — D689 Coagulation defect, unspecified: Secondary | ICD-10-CM | POA: Diagnosis not present

## 2024-03-07 DIAGNOSIS — Z992 Dependence on renal dialysis: Secondary | ICD-10-CM | POA: Diagnosis not present

## 2024-03-07 DIAGNOSIS — D631 Anemia in chronic kidney disease: Secondary | ICD-10-CM | POA: Diagnosis not present

## 2024-03-10 DIAGNOSIS — D631 Anemia in chronic kidney disease: Secondary | ICD-10-CM | POA: Diagnosis not present

## 2024-03-10 DIAGNOSIS — N186 End stage renal disease: Secondary | ICD-10-CM | POA: Diagnosis not present

## 2024-03-10 DIAGNOSIS — N2581 Secondary hyperparathyroidism of renal origin: Secondary | ICD-10-CM | POA: Diagnosis not present

## 2024-03-10 DIAGNOSIS — D689 Coagulation defect, unspecified: Secondary | ICD-10-CM | POA: Diagnosis not present

## 2024-03-10 DIAGNOSIS — Z992 Dependence on renal dialysis: Secondary | ICD-10-CM | POA: Diagnosis not present

## 2024-03-10 NOTE — Telephone Encounter (Signed)
 Pt aware we are waiting on insurance approval for listing. Reminded pt to attend colorectal f/u on 7/31 for active listing, verbalized understanding.

## 2024-03-10 NOTE — Telephone Encounter (Signed)
 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

## 2024-03-10 NOTE — Progress Notes (Signed)
 Ariel [name] is a 48 y.o. female being evaluated for possible Kidney Transplantation. All medications have been reviewed and no medication related contraindications to Kidney Transplantation are noted.    Current Medications and Prescriptions Ordered in Epic[1]       [1] Current Outpatient Medications Ordered in Epic  Medication Sig Dispense Refill  . acetaminophen  (TYLENOL ) 500 mg tablet Take 500 mg by mouth every 6 (six) hours as needed for mild pain (1-3) or moderate pain (4-6).    . albuterol  HFA (PROVENTIL  HFA;VENTOLIN  HFA;PROAIR  HFA) 90 mcg/actuation inhaler inhale 1 to 2 puffs by mouth every 4 to 6 hours as needed    . allopurinoL  (ZYLOPRIM ) 100 mg tablet Take 100 mg by mouth Once Daily.    . ALPRAZolam  (XANAX ) 1 mg tablet Take 0.5-1 mg by mouth daily as needed for anxiety or sleep.    SABRA amLODIPine (NORVASC) 10 mg tablet Take 10 mg by mouth daily.    . aspirin -acetaminophen -caffeine  (EXCEDRIN  ES) 250-250-65 mg tab per tablet Take 1 tablet by mouth every 6 (six) hours as needed for headaches.    . Cozaar 50 mg tablet Take 1 tablet by mouth 2 (two) times a day.    . famotidine  (PEPCID ) 20 mg tablet Take 0.5 tablets (10 mg total) by mouth daily.    . furosemide (LASIX) 80 mg tablet Take 1 tablet (80 mg total) by mouth daily as needed (Take only on non-dialysis days.). 80 tablet 0  . gabapentin  (NEURONTIN ) 300 mg capsule Take 300 mg by mouth at bedtime.    . magnesium  gluconate 27.5 mg magne- sium (500 mg) tab Take 500 mg by mouth daily.    . medroxyPROGESTERone  (PROVERA ) 10 mg tablet Take 1 tablet (10 mg total) by mouth daily. 90 tablet 2  . ondansetron  (ZOFRAN -ODT) 4 mg disintegrating tablet Dissolve 4 mg on tongue every 8 (eight) hours as needed for nausea.    . sertraline  (ZOLOFT ) 50 mg tablet Take 50 mg by mouth Once Daily.    . sevelamer  carbonate (Renvela ) 800 mg tablet Take 3 tablets by mouth in the morning and 3 tablets at noon and 3 tablets in the evening. Take with  meals.    . simvastatin  (ZOCOR ) 5 mg tablet Take 5 mg by mouth Once Daily.    . traZODone  (DESYREL ) 100 mg tablet Take 100 mg by mouth nightly as needed for sleep.     No current Epic-ordered facility-administered medications on file.

## 2024-03-12 DIAGNOSIS — N186 End stage renal disease: Secondary | ICD-10-CM | POA: Diagnosis not present

## 2024-03-12 DIAGNOSIS — D631 Anemia in chronic kidney disease: Secondary | ICD-10-CM | POA: Diagnosis not present

## 2024-03-12 DIAGNOSIS — N2581 Secondary hyperparathyroidism of renal origin: Secondary | ICD-10-CM | POA: Diagnosis not present

## 2024-03-12 DIAGNOSIS — Z992 Dependence on renal dialysis: Secondary | ICD-10-CM | POA: Diagnosis not present

## 2024-03-12 DIAGNOSIS — D689 Coagulation defect, unspecified: Secondary | ICD-10-CM | POA: Diagnosis not present

## 2024-03-16 DIAGNOSIS — Z992 Dependence on renal dialysis: Secondary | ICD-10-CM | POA: Diagnosis not present

## 2024-03-16 DIAGNOSIS — N2581 Secondary hyperparathyroidism of renal origin: Secondary | ICD-10-CM | POA: Diagnosis not present

## 2024-03-16 DIAGNOSIS — N186 End stage renal disease: Secondary | ICD-10-CM | POA: Diagnosis not present

## 2024-03-16 DIAGNOSIS — D631 Anemia in chronic kidney disease: Secondary | ICD-10-CM | POA: Diagnosis not present

## 2024-03-16 DIAGNOSIS — E876 Hypokalemia: Secondary | ICD-10-CM | POA: Diagnosis not present

## 2024-03-17 NOTE — Progress Notes (Signed)
 ------------------------------ BASIC NOTE ------------------------------  Patient: Ariel [name] DOB: 1975-12-23 MRN: [id number] Note Author: ALMON CAMIE HERO, NP Service Date: 03/10/2024  This patient was personally seen for a basic visit as part of routine monthly dialysis care for end stage renal disease.  Attending Nephrologist: ALMON CAMIE HERO Dialysis Location: [facility] Schedule: Tu-Th-Sa Shift: 1  ------------------------------ DIALYSIS PRESCRIPTION ------------------------------  Treatment Data -------------- Treatment Date: 03/16/2024 started at: 6:40 AM Dialysate / Machine Temp (prescribed): 36.0*C Dialysate / Machine Temp (actual): 36.0*C BFR (prescribed): 350 BFR (average delivered): 350 DFR (prescribed): Autoflow 1.5 DFR (average delivered): 560 Prescribed Time: 03:30 Actual Time: 03:38 EDW (kg): 70.2 Dialyzer: 180NRe Optiflux Dialysate: 3.0 K, 2.5 Ca, 1.0 Mg, 100 Dextrose  (G3251) Sodium: 137 Bicarb: 35  Pre Dialysis Vitals ------------------- Pre BP Sit: 128/71 Pre Wt (kg): 76.5 EDW Deviation (kg): 6.3 Temp: 98.1*F  Post Dialysis Vitals -------------------- Post BP Sit: 157/85 Post Wt (kg): 73.7  ------------------------------ TREATMENT MEDICATIONS ORDERS ------------------------------  Heparin  Sodium (Porcine) 1,000 Units/mL Systemic 2000 units IVP Every Treatment 03/03/2024 - 03/02/2025  Heparin  Sodium (Porcine) 1,000 Units/mL Systemic 2000 units IVP Every Treatment 09/04/2023 - 09/02/2024  Mircera 150 mcg IVP Every 2 weeks During Dialysis 03/02/2024 - 03/01/2025  Vitamin D (Calcitriol ) Oral 0.25 mcg ORAL Every Treatment During Dialysis 03/03/2024 - 03/02/2025  Vitamin D (Calcitriol ) Oral 0.25 mcg ORAL Every Treatment 02/17/2024 - 02/15/2025  ------------------------------ BP AND FLUID ASSESSMENT ------------------------------  Post BP Sit 157/85 - 03/16/2024 144/77 - 03/12/2024 145/83 - 03/10/2024  Post Wt  (kg) 73.7 - 03/16/2024 72.9 - 03/12/2024 73.9 - 03/10/2024  EDW (kg) 70.2 - 03/16/2024 71.5 - 03/12/2024 71.5 - 03/10/2024  Deviation (kg) 3.5 - 03/16/2024 1.4 - 03/12/2024 2.4 - 03/10/2024  ------------------------------ ADEQUACY ASSESSMENT ------------------------------  % Urea  Reduction 71 (03/05/24)  BUN 48 (03/05/24)  BUN Post Dialysis 14 (03/05/24)  Creatinine 6.46 (03/05/24)  Missed Treatments 8 - Last 30 days 16 - Last 60 days Most recently missed on 03/14/2024 ------------------------------ ACCESS ASSESSMENT ------------------------------  AVFistula Standard Right Upper Arm Active (In Use) - 08/29/2022 Placed - 05/17/2022 Access Flow 1438 (03/07/24) 1639 (02/17/24) 729 (02/10/24)  ------------------------------ ANEMIA ASSESSMENT ------------------------------  Hemoglobin 9.1 (03/12/24) 9.8 (03/05/24)  ------------------------------ BMM ASSESSMENT ------------------------------  Calcium 8.2 03/05/24  Corrected Calcium 8.7 03/05/24  ------------------------------ NUTRITION ASSESSMENT ------------------------------  Albumin 3.4 03/05/24  Potassium 4.4 03/05/24  ------------------------------ ADDITIONAL COMMENT ------------------------------  COMMENTS: 03/10/2024: The patient is a transient from [address], [place] and will be here until the end of the month. EDW 71.5 kg. Average fluid removal 2.27. Patient getting down to a weight of 72.8 kg. Average predialysis BP 153/82, average post 153/78. URR and Kt/V both within goal. Hemoglobin with a slight trend down to 9.8. Currently not on any ESA or iron at this time. She is complaining of some right flank pain which has been relieved with heat and over-the-counter Tylenol . She does have an appointment with her PCP towards the end of the month. The patient is concerned regarding that right flank pain as she did undergo a liver biopsy this past May for further workup of her renal transplant and two weeks  later was hospitalized with a stomach abscess. I did advise her to go to the ER and to also reach out to her providers back home versus waiting until she returns home for further workup. She is afebrile. She denies any chills or loss of appetite. She has some mild right intermittent flank pain. Physical exam completed. Respiratory: Respiration relaxed and unlabored  on room air. Breath sounds clear to auscultation throughout. Cardiovascular: S1/S2 heard. Regular rate and rhythm. Abdomen: Soft, nontender, nondistended. Extremities: No bilateral lower extremity edema noted. Right AVF with positive bruit and thrill with an access flow of 1438 for the month of July. Med-rec completed and blood pressure medication notable for amlodipine, Coreg , Cozaar, and furosemide. The patient, otherwise, is feeling well and has no further questions or concerns at this time.   Signed by: ALMON CAMIE HERO, NP on 03/17/2024 at 01:48:40 PM Transcribed by: Roseline Blare on 03/17/2024 at 09:38:02 AM

## 2024-03-18 DIAGNOSIS — Z79899 Other long term (current) drug therapy: Secondary | ICD-10-CM | POA: Diagnosis not present

## 2024-03-18 DIAGNOSIS — Z992 Dependence on renal dialysis: Secondary | ICD-10-CM | POA: Diagnosis not present

## 2024-03-18 DIAGNOSIS — E892 Postprocedural hypoparathyroidism: Secondary | ICD-10-CM | POA: Diagnosis not present

## 2024-03-18 DIAGNOSIS — K651 Peritoneal abscess: Secondary | ICD-10-CM | POA: Diagnosis not present

## 2024-03-18 DIAGNOSIS — E875 Hyperkalemia: Secondary | ICD-10-CM | POA: Diagnosis not present

## 2024-03-18 DIAGNOSIS — R1010 Upper abdominal pain, unspecified: Secondary | ICD-10-CM | POA: Diagnosis not present

## 2024-03-18 DIAGNOSIS — Z940 Kidney transplant status: Secondary | ICD-10-CM | POA: Diagnosis not present

## 2024-03-18 DIAGNOSIS — Z882 Allergy status to sulfonamides status: Secondary | ICD-10-CM | POA: Diagnosis not present

## 2024-03-18 DIAGNOSIS — R9431 Abnormal electrocardiogram [ECG] [EKG]: Secondary | ICD-10-CM | POA: Diagnosis not present

## 2024-03-18 DIAGNOSIS — M109 Gout, unspecified: Secondary | ICD-10-CM | POA: Diagnosis not present

## 2024-03-18 DIAGNOSIS — D631 Anemia in chronic kidney disease: Secondary | ICD-10-CM | POA: Diagnosis not present

## 2024-03-18 DIAGNOSIS — E7800 Pure hypercholesterolemia, unspecified: Secondary | ICD-10-CM | POA: Diagnosis not present

## 2024-03-18 DIAGNOSIS — G43909 Migraine, unspecified, not intractable, without status migrainosus: Secondary | ICD-10-CM | POA: Diagnosis not present

## 2024-03-18 DIAGNOSIS — I120 Hypertensive chronic kidney disease with stage 5 chronic kidney disease or end stage renal disease: Secondary | ICD-10-CM | POA: Diagnosis not present

## 2024-03-18 DIAGNOSIS — R1011 Right upper quadrant pain: Secondary | ICD-10-CM | POA: Diagnosis not present

## 2024-03-18 DIAGNOSIS — R109 Unspecified abdominal pain: Secondary | ICD-10-CM | POA: Diagnosis not present

## 2024-03-18 DIAGNOSIS — Z888 Allergy status to other drugs, medicaments and biological substances status: Secondary | ICD-10-CM | POA: Diagnosis not present

## 2024-03-18 DIAGNOSIS — Z87440 Personal history of urinary (tract) infections: Secondary | ICD-10-CM | POA: Diagnosis not present

## 2024-03-18 DIAGNOSIS — Z881 Allergy status to other antibiotic agents status: Secondary | ICD-10-CM | POA: Diagnosis not present

## 2024-03-18 DIAGNOSIS — N186 End stage renal disease: Secondary | ICD-10-CM | POA: Diagnosis not present

## 2024-03-18 DIAGNOSIS — K659 Peritonitis, unspecified: Secondary | ICD-10-CM | POA: Diagnosis not present

## 2024-03-18 DIAGNOSIS — E871 Hypo-osmolality and hyponatremia: Secondary | ICD-10-CM | POA: Diagnosis not present

## 2024-03-18 DIAGNOSIS — K219 Gastro-esophageal reflux disease without esophagitis: Secondary | ICD-10-CM | POA: Diagnosis not present

## 2024-03-18 DIAGNOSIS — Z886 Allergy status to analgesic agent status: Secondary | ICD-10-CM | POA: Diagnosis not present

## 2024-03-18 DIAGNOSIS — E039 Hypothyroidism, unspecified: Secondary | ICD-10-CM | POA: Diagnosis not present

## 2024-03-23 DIAGNOSIS — K659 Peritonitis, unspecified: Secondary | ICD-10-CM | POA: Diagnosis not present

## 2024-03-23 DIAGNOSIS — M159 Polyosteoarthritis, unspecified: Secondary | ICD-10-CM | POA: Diagnosis not present

## 2024-03-23 DIAGNOSIS — D649 Anemia, unspecified: Secondary | ICD-10-CM | POA: Diagnosis not present

## 2024-03-23 DIAGNOSIS — K219 Gastro-esophageal reflux disease without esophagitis: Secondary | ICD-10-CM | POA: Diagnosis not present

## 2024-03-23 DIAGNOSIS — N186 End stage renal disease: Secondary | ICD-10-CM | POA: Diagnosis not present

## 2024-03-23 DIAGNOSIS — M109 Gout, unspecified: Secondary | ICD-10-CM | POA: Diagnosis not present

## 2024-03-23 DIAGNOSIS — D631 Anemia in chronic kidney disease: Secondary | ICD-10-CM | POA: Diagnosis not present

## 2024-03-23 DIAGNOSIS — I10 Essential (primary) hypertension: Secondary | ICD-10-CM | POA: Diagnosis not present

## 2024-03-23 DIAGNOSIS — Z992 Dependence on renal dialysis: Secondary | ICD-10-CM | POA: Diagnosis not present

## 2024-03-23 DIAGNOSIS — N2581 Secondary hyperparathyroidism of renal origin: Secondary | ICD-10-CM | POA: Diagnosis not present

## 2024-03-23 DIAGNOSIS — E876 Hypokalemia: Secondary | ICD-10-CM | POA: Diagnosis not present

## 2024-03-25 DIAGNOSIS — D631 Anemia in chronic kidney disease: Secondary | ICD-10-CM | POA: Diagnosis not present

## 2024-03-25 DIAGNOSIS — N186 End stage renal disease: Secondary | ICD-10-CM | POA: Diagnosis not present

## 2024-03-25 DIAGNOSIS — N2581 Secondary hyperparathyroidism of renal origin: Secondary | ICD-10-CM | POA: Diagnosis not present

## 2024-03-25 DIAGNOSIS — Z992 Dependence on renal dialysis: Secondary | ICD-10-CM | POA: Diagnosis not present

## 2024-03-25 DIAGNOSIS — E876 Hypokalemia: Secondary | ICD-10-CM | POA: Diagnosis not present

## 2024-03-27 DIAGNOSIS — D631 Anemia in chronic kidney disease: Secondary | ICD-10-CM | POA: Diagnosis not present

## 2024-03-27 DIAGNOSIS — N186 End stage renal disease: Secondary | ICD-10-CM | POA: Diagnosis not present

## 2024-03-27 DIAGNOSIS — N2581 Secondary hyperparathyroidism of renal origin: Secondary | ICD-10-CM | POA: Diagnosis not present

## 2024-03-27 DIAGNOSIS — Z992 Dependence on renal dialysis: Secondary | ICD-10-CM | POA: Diagnosis not present

## 2024-03-27 DIAGNOSIS — E876 Hypokalemia: Secondary | ICD-10-CM | POA: Diagnosis not present

## 2024-03-30 DIAGNOSIS — E876 Hypokalemia: Secondary | ICD-10-CM | POA: Diagnosis not present

## 2024-03-30 DIAGNOSIS — N2581 Secondary hyperparathyroidism of renal origin: Secondary | ICD-10-CM | POA: Diagnosis not present

## 2024-03-30 DIAGNOSIS — D631 Anemia in chronic kidney disease: Secondary | ICD-10-CM | POA: Diagnosis not present

## 2024-03-30 DIAGNOSIS — N186 End stage renal disease: Secondary | ICD-10-CM | POA: Diagnosis not present

## 2024-03-30 DIAGNOSIS — Z992 Dependence on renal dialysis: Secondary | ICD-10-CM | POA: Diagnosis not present

## 2024-03-31 DIAGNOSIS — R42 Dizziness and giddiness: Secondary | ICD-10-CM | POA: Diagnosis not present

## 2024-04-01 DIAGNOSIS — K219 Gastro-esophageal reflux disease without esophagitis: Secondary | ICD-10-CM | POA: Diagnosis not present

## 2024-04-01 DIAGNOSIS — D649 Anemia, unspecified: Secondary | ICD-10-CM | POA: Diagnosis not present

## 2024-04-01 DIAGNOSIS — Z992 Dependence on renal dialysis: Secondary | ICD-10-CM | POA: Diagnosis not present

## 2024-04-01 DIAGNOSIS — I10 Essential (primary) hypertension: Secondary | ICD-10-CM | POA: Diagnosis not present

## 2024-04-01 DIAGNOSIS — K659 Peritonitis, unspecified: Secondary | ICD-10-CM | POA: Diagnosis not present

## 2024-04-01 DIAGNOSIS — Z01818 Encounter for other preprocedural examination: Secondary | ICD-10-CM | POA: Diagnosis not present

## 2024-04-01 DIAGNOSIS — M109 Gout, unspecified: Secondary | ICD-10-CM | POA: Diagnosis not present

## 2024-04-01 DIAGNOSIS — M159 Polyosteoarthritis, unspecified: Secondary | ICD-10-CM | POA: Diagnosis not present

## 2024-04-01 DIAGNOSIS — N186 End stage renal disease: Secondary | ICD-10-CM | POA: Diagnosis not present

## 2024-04-02 ENCOUNTER — Other Ambulatory Visit: Payer: Self-pay | Admitting: Oncology

## 2024-04-02 ENCOUNTER — Inpatient Hospital Stay: Admitting: Oncology

## 2024-04-02 ENCOUNTER — Inpatient Hospital Stay

## 2024-04-02 DIAGNOSIS — Z992 Dependence on renal dialysis: Secondary | ICD-10-CM | POA: Diagnosis not present

## 2024-04-02 DIAGNOSIS — E876 Hypokalemia: Secondary | ICD-10-CM | POA: Diagnosis not present

## 2024-04-02 DIAGNOSIS — D631 Anemia in chronic kidney disease: Secondary | ICD-10-CM | POA: Diagnosis not present

## 2024-04-02 DIAGNOSIS — N186 End stage renal disease: Secondary | ICD-10-CM | POA: Diagnosis not present

## 2024-04-02 DIAGNOSIS — N2581 Secondary hyperparathyroidism of renal origin: Secondary | ICD-10-CM | POA: Diagnosis not present

## 2024-04-02 DIAGNOSIS — T8610 Unspecified complication of kidney transplant: Secondary | ICD-10-CM | POA: Diagnosis not present

## 2024-04-02 NOTE — Progress Notes (Incomplete)
 [facility]  [address] [place],  [place]  [address] [phone]  Clinic Day:  04/02/2024  Referring physician: Elaine Garnette BIRCH., MD   ASSESSMENT & PLAN:  Assessment:    Plan:  I discussed the assessment and treatment plan with the patient.  The patient was provided an opportunity to ask questions and all were answered.  The patient agreed with the plan and demonstrated an understanding of the instructions.  The patient was advised to call back if the symptoms worsen or if the condition fails to improve as anticipated.  Thank you for the opportunity   I provided *** minutes of face-to-face time during this this encounter and > 50% was spent counseling as documented under my assessment and plan.    Wanda VEAR Cornish, MD [facility] - A DEPT OF [facility] [address] [place] [address] Dept: [phone] Dept Fax: [phone]   I, Jasmine Lassiter, am acting as scribe for Wanda HILARIO Cornish, MD  I have reviewed this report as typed by the medical scribe, and it is complete and accurate.  Jasmine M Lassiter   7/31/20258:46 AM  CHIEF COMPLAINT:  CC: ***  Current Treatment:  ***   HISTORY OF PRESENT ILLNESS:  Ariel [name] is a 48 y.o. female with a history of *** who is referred in consultation with {REFERRING PHYSICIAN} for assessment and management.     Hx of anal cancer Anemia  Hgb 8.6 on 02/10/2024  Hgb 9.8 on 03/05/2024   Hgb 9.1 on 03/12/2024  I have reviewed her chart and materials related to her cancer extensively and collaborated history with the patient. Summary of oncologic history is as follows: Oncology History   No history exists.    INTERVAL HISTORY:          Ariel [name] is seen in the clinic for follow up of her ***. She denies fever, chills, night sweats, or other signs of infection. She denies cardiorespiratory and gastrointestinal issues.  She  denies pain. Her appetite is good. Her weight {Weight change:10426}.  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   AKI (acute kidney injury) (HCC) 08/18/2021   Allergy, unspecified, initial encounter 08/28/2022   Anaphylactic shock, unspecified, initial encounter 08/28/2022   Anemia associated with chronic renal failure    Anemia in chronic kidney disease 08/21/2022   Anorectal disorder 06/08/2020   Anorectal pain 06/08/2020   Anxiety disorder 10/29/2018   Back pain    Bloating 04/16/2018   Breakdown (mechanical) of unspecified cardiac and vascular devices and implants, subsequent encounter 10/12/2022   Cancer (HCC)    pre cervical   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    of the [place]   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 09/05/2022   Carcinoma in situ of vulva 09/05/2022   Chest pain of uncertain etiology 05/09/2022   Chronic kidney disease, stage V (HCC) 11/03/2021   Complications due to cardiac device, implant, and graft 09/02/2012   Condyloma of female genitalia    Dehydration 01/19/2019   Dependence on renal dialysis (HCC) 08/21/2022   Depression    Dialysis patient (HCC)    not currently as of 07/02/22   Drug-induced bleeding disorder (HCC) 01/19/2019   DVT of axillary vein, acute left (HCC) 08/02/2012   Dysmenorrhea 10/19/2015  Dyspnea on exertion 01/19/2019   Dysuria    End stage renal disease (HCC) 09/02/2012   End stage renal disease on dialysis (HCC) 11/05/2022   ESRD (end stage renal disease) (HCC)    sp transplant x 2, did mix of HD and PD from 2001- 2008   Essential hypertension 11/03/2021   Essential hypertension with goal blood pressure less than 130/80    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   Fatigue    Gastritis, acute    GERD without esophagitis 12/20/2021   Gout    Gout due to renal impairment, unspecified site  08/21/2022   History of parathyroidectomy 10/29/2018   History of renal transplant    HTN (hypertension) 01/19/2019   Hypercholesterolemia    Hyperlipidemia    Hyperphosphatemia 12/20/2021   Hypertensive chronic kidney disease with stage 1 through stage 4 chronic kidney disease, or unspecified chronic kidney disease 08/21/2022   Hypokalemia    Hypomagnesemia    Hypothyroid 01/19/2019   IBS (irritable bowel syndrome)    Immunosuppressive management encounter following kidney transplant 10/29/2018   Internal hemorrhoid 04/22/2018   Iron deficiency anemia 10/19/2015   Irregular uterine bleeding 02/13/2016   Kidney disorder 06/08/2020   Kidney transplant recipient 10/29/2018   1st transplant [facility]) was 1995- 2001.  Did HD/ PD mix from 2001- 2008. 2nd transplant ([facility]/ [facility]) was in 2008 and is still working as of 12/2021. F/b Dr Gearline ([place]) [facility].   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   Nausea and vomiting 01/19/2019   Nausea and vomiting 01/19/2019   Nephrogenic diabetes insipidus (HCC)    congenital   Obesity, Class I, BMI 30-34.9 12/21/2021   Other chronic sinusitis 10/29/2018   Other fluid overload 10/12/2022   Pain, unspecified 08/28/2022   Palpitations    Pancreatic duct dilated 02/15/2022   Pancreatitis    Perianal lesion 04/22/2018   Phlebitis and thrombophlebitis of other sites 08/02/2012   Plantar fasciitis 07/03/2018   Pre-transplant evaluation for kidney transplant 01/09/2022   Preop cardiovascular exam 05/09/2022   Pruritus ani 06/26/2018    Renal failure, chronic 11/03/2021   Dialysis in the past   Right lower quadrant abdominal pain 10/29/2018   [facility] spotted fever    S/p cadaver renal transplant 08/02/2012   Secondary hyperparathyroidism of renal origin [facility])    Secondary hypertension due to renal disease 10/29/2018   Sesamoiditis 07/03/2018   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    Transplanted organ removal status 10/29/2018   Unstable angina (HCC) 01/19/2019   Urinary tract infection    Vision loss 06/15/2022   Vulvar dystrophy    Yeast dermatitis 08/14/2018    Past Surgical History:  Procedure Laterality Date   AV FISTULA PLACEMENT     AV FISTULA PLACEMENT Right 05/17/2022   Procedure: RIGHT ARM BRACHIOCEPHALIC ARTERIOVENOUS (AV) FISTULA CREATION;  Surgeon: Lanis Fonda BRAVO, MD;  Location: [facility] OR;  Service: Vascular;  Laterality: Right;   COLONOSCOPY  11/28/2005    GI   COLONOSCOPY  04/13/2016   Dr Anette   CORONARY PRESSURE/FFR STUDY N/A  11/29/2022   Procedure: INTRAVASCULAR PRESSURE WIRE/FFR STUDY;  Surgeon: Thukkani, Arun K, MD;  Location: [facility] INVASIVE CV LAB;  Service: Cardiovascular;  Laterality: N/A;   ESOPHAGOGASTRODUODENOSCOPY  07/31/2017   Distal esophageal ulcers (likely due to gastroeesphageal reflix-biopsied). Small hiatal hernia. Erosive gastritis.   FISTULA SUPERFICIALIZATION Right 07/17/2022   Procedure: SUPERFICIALIZATION AND REVISION RIGHT ARM FISTULA;  Surgeon: Lanis Fonda BRAVO, MD;  Location: [facility] OR;  Service: Vascular;  Laterality: Right;   HEMORRHOID SURGERY     2019, and 04/29/2020   INSERTION OF DIALYSIS CATHETER     KIDNEY TRANSPLANT  1995, 2008   KNEE SURGERY Left 2020   LEFT HEART CATH AND CORONARY ANGIOGRAPHY N/A 11/29/2022   Procedure: LEFT HEART CATH AND CORONARY ANGIOGRAPHY;  Surgeon: Wendel Lurena POUR, MD;  Location: [facility] INVASIVE CV LAB;  Service: Cardiovascular;  Laterality: N/A;   PARATHYROIDECTOMY  2004   Portion on the parathyroid  gland  transplanted in R forearm   REMOVAL OF A DIALYSIS CATHETER     SIMPLE VULVECTOMY  06/19/2011   Partial simple left   SKIN CANCER EXCISION  2016   Per pt, area removed on scalp showed squamous cell carcinoma   TUBAL LIGATION     UPPER GASTROINTESTINAL ENDOSCOPY     WISDOM TOOTH EXTRACTION      Family History  Problem Relation Age of Onset   Hypertension Mother    Hyperlipidemia Mother    Hypercholesterolemia Mother    Diabetes Father    Thyroid  cancer Father    Lung cancer Father    Hypercholesterolemia Father    Hypertension Father    Rectal cancer Maternal Grandmother    Colon cancer Maternal Grandmother    Esophageal cancer Neg Hx    Stomach cancer Neg Hx     Social History:  reports that she has never smoked. She has never been exposed to tobacco smoke. She has never used smokeless tobacco. She reports that she does not drink alcohol and does not use drugs.The patient is {Blank single:19197::alone,accompanied by} *** today.  Allergies:  Allergies  Allergen Reactions   Bactrim [Sulfamethoxazole-Trimethoprim] Anaphylaxis   Sulfamethoxazole Anaphylaxis   Ambien [Zolpidem]     Sleep walking, hallucinations   Hydrogen Peroxide Rash   Labetalol  Hives and Rash   Wound Dressing Adhesive Hives   Ace Inhibitors Other (See Comments)    Due to Kidney Transplant   Amlodipine Swelling   Hyoscyamine Other (See Comments)    Dizziness    Keflex [Cephalexin] Hives and Swelling    Per pt, her face and lips swell up and she develops hives   Orphenadrine Hives   Adhesive [Tape] Rash    Can not use for long periods of time (3 days or more)   Imuran [Azathioprine Sodium] Rash   Neosporin [Bacitracin-Polymyxin B] Rash and Other (See Comments)    Can use that for awhile, but will eventually develop a rash    Tramadol Rash   Warfarin Sodium  Rash    Current Medications: Current Outpatient Medications  Medication Sig Dispense Refill   acetaminophen  (TYLENOL ) 500 MG tablet Take  500 mg by mouth every 6 (six) hours as needed for headache (pain).     allopurinol  (ZYLOPRIM ) 100 MG tablet Take 100 mg by mouth daily.     ALPRAZolam  (XANAX ) 0.5 MG tablet Take 0.5 mg by mouth at bedtime.   1   B Complex-C-Folic Acid  (RENA-VITE RX) 1 MG TABS Take 1 tablet by mouth daily.     carvedilol  (COREG ) 6.25  MG tablet Take 1 tablet (6.25 mg total) by mouth 2 (two) times daily. 180 tablet 3   COZAAR 50 MG tablet Take 50 mg by mouth daily.     famotidine  (PEPCID ) 20 MG tablet Take 20 mg by mouth daily.     furosemide (LASIX) 40 MG tablet Take 40 mg by mouth daily as needed for fluid or edema.      gabapentin  (NEURONTIN ) 300 MG capsule Take 300 mg by mouth at bedtime.     hydrALAZINE  (APRESOLINE ) 25 MG tablet Take 1 tablet (25 mg total) by mouth 3 (three) times daily. 270 tablet 3   magnesium  oxide (MAG-OX) 400 (240 Mg) MG tablet Take 400 mg by mouth daily.     medroxyPROGESTERone  (PROVERA ) 10 MG tablet Take 10 mg by mouth daily.  3   nitroGLYCERIN  (NITROSTAT ) 0.4 MG SL tablet Place 1 tablet (0.4 mg total) under the tongue every 5 (five) minutes as needed for chest pain. 25 tablet 6   pantoprazole  (PROTONIX ) 40 MG tablet TAKE 1 TABLET(40 MG) BY MOUTH DAILY 90 tablet 1   promethazine  (PHENERGAN ) 12.5 MG tablet Take 12.5 mg by mouth every 6 (six) hours as needed for nausea or vomiting.     sertraline  (ZOLOFT ) 50 MG tablet Take 50 mg by mouth daily.     sevelamer  carbonate (RENVELA ) 800 MG tablet Take 800 mg by mouth 3 (three) times daily with meals.     simvastatin  (ZOCOR ) 5 MG tablet Take 5 mg by mouth at bedtime.     traZODone  (DESYREL ) 50 MG tablet Take 50 mg by mouth at bedtime.     No current facility-administered medications for this visit.    REVIEW OF SYSTEMS:  Review of Systems - Oncology    VITALS:  Last menstrual period 09/13/2013.  Wt Readings from Last 3 Encounters:  09/19/23 164 lb (74.4 kg)  01/01/23 168 lb 6.4 oz (76.4 kg)  12/06/22 168 lb 1.6 oz (76.2 kg)     There is no height or weight on file to calculate BMI.  Performance status (ECOG): {CHL ONC [id number]  PHYSICAL EXAM:  Physical Exam   LABS:      Latest Ref Rng & Units 11/27/2022    2:33 PM 11/19/2022    9:29 AM 07/17/2022    8:17 AM  CBC  WBC 3.4 - 10.8 x10E3/uL 4.9  5.5    Hemoglobin 11.1 - 15.9 g/dL 8.8  8.9  8.8   Hematocrit 34.0 - 46.6 % 28.2  28.5  26.0   Platelets 150 - 450 x10E3/uL 222  199        Latest Ref Rng & Units 11/27/2022    2:33 PM 11/19/2022    9:29 AM 07/17/2022    8:17 AM  CMP  Glucose 70 - 99 mg/dL 871  85  89   BUN 6 - 24 mg/dL 12  28  41   Creatinine 0.57 - 1.00 mg/dL 4.77  2.65  6.29   Sodium 134 - 144 mmol/L 142  135  140   Potassium 3.5 - 5.2 mmol/L 4.1  4.1  4.6   Chloride 96 - 106 mmol/L 102  104  107   CO2 20 - 29 mmol/L 20  19    Calcium 8.7 - 10.2 mg/dL 9.6  9.0    Total Protein 6.5 - 8.1 g/dL  6.2    Total Bilirubin 0.3 - 1.2 mg/dL  0.5    Alkaline Phos 38 - 126 U/L  72  AST 15 - 41 U/L  25    ALT 0 - 44 U/L  16       No results found for: CEA1, CEA / No results found for: CEA1, CEA No results found for: PSA1 No results found for: CAN199 No results found for: CAN125  Lab Results  Component Value Date   TOTALPROTELP 5.7 (L) 12/22/2021   ALBUMINELP 3.1 12/22/2021   A1GS 0.2 12/22/2021   A2GS 1.0 12/22/2021   BETS 0.7 12/22/2021   GAMS 0.7 12/22/2021   MSPIKE Not Observed 12/22/2021   SPEI Comment 12/22/2021   No results found for: TIBC, FERRITIN, IRONPCTSAT No results found for: LDH  STUDIES:  No results found.        I,Jasmine M Lassiter,acting as a scribe for Wanda VEAR Cornish, MD.,have documented all relevant documentation on the behalf of Wanda VEAR Cornish, MD,as directed by  Wanda VEAR Cornish, MD while in the presence of Wanda VEAR Cornish, MD.

## 2024-04-06 DIAGNOSIS — N2581 Secondary hyperparathyroidism of renal origin: Secondary | ICD-10-CM | POA: Diagnosis not present

## 2024-04-06 DIAGNOSIS — E876 Hypokalemia: Secondary | ICD-10-CM | POA: Diagnosis not present

## 2024-04-06 DIAGNOSIS — Z992 Dependence on renal dialysis: Secondary | ICD-10-CM | POA: Diagnosis not present

## 2024-04-06 DIAGNOSIS — D631 Anemia in chronic kidney disease: Secondary | ICD-10-CM | POA: Diagnosis not present

## 2024-04-06 DIAGNOSIS — N186 End stage renal disease: Secondary | ICD-10-CM | POA: Diagnosis not present

## 2024-04-08 DIAGNOSIS — D631 Anemia in chronic kidney disease: Secondary | ICD-10-CM | POA: Diagnosis not present

## 2024-04-08 DIAGNOSIS — Z992 Dependence on renal dialysis: Secondary | ICD-10-CM | POA: Diagnosis not present

## 2024-04-08 DIAGNOSIS — E876 Hypokalemia: Secondary | ICD-10-CM | POA: Diagnosis not present

## 2024-04-08 DIAGNOSIS — N186 End stage renal disease: Secondary | ICD-10-CM | POA: Diagnosis not present

## 2024-04-08 DIAGNOSIS — N2581 Secondary hyperparathyroidism of renal origin: Secondary | ICD-10-CM | POA: Diagnosis not present

## 2024-04-13 DIAGNOSIS — E876 Hypokalemia: Secondary | ICD-10-CM | POA: Diagnosis not present

## 2024-04-15 DIAGNOSIS — N186 End stage renal disease: Secondary | ICD-10-CM | POA: Diagnosis not present

## 2024-04-20 DIAGNOSIS — Z992 Dependence on renal dialysis: Secondary | ICD-10-CM | POA: Diagnosis not present

## 2024-04-20 DIAGNOSIS — N186 End stage renal disease: Secondary | ICD-10-CM | POA: Diagnosis not present

## 2024-04-20 DIAGNOSIS — N2581 Secondary hyperparathyroidism of renal origin: Secondary | ICD-10-CM | POA: Diagnosis not present

## 2024-04-20 DIAGNOSIS — D631 Anemia in chronic kidney disease: Secondary | ICD-10-CM | POA: Diagnosis not present

## 2024-04-20 DIAGNOSIS — M159 Polyosteoarthritis, unspecified: Secondary | ICD-10-CM | POA: Diagnosis not present

## 2024-04-20 DIAGNOSIS — I10 Essential (primary) hypertension: Secondary | ICD-10-CM | POA: Diagnosis not present

## 2024-04-20 DIAGNOSIS — D649 Anemia, unspecified: Secondary | ICD-10-CM | POA: Diagnosis not present

## 2024-04-20 DIAGNOSIS — E876 Hypokalemia: Secondary | ICD-10-CM | POA: Diagnosis not present

## 2024-04-20 DIAGNOSIS — M109 Gout, unspecified: Secondary | ICD-10-CM | POA: Diagnosis not present

## 2024-04-20 DIAGNOSIS — K219 Gastro-esophageal reflux disease without esophagitis: Secondary | ICD-10-CM | POA: Diagnosis not present

## 2024-04-24 DIAGNOSIS — D631 Anemia in chronic kidney disease: Secondary | ICD-10-CM | POA: Diagnosis not present

## 2024-04-27 ENCOUNTER — Other Ambulatory Visit: Payer: Self-pay | Admitting: Oncology

## 2024-04-27 DIAGNOSIS — D631 Anemia in chronic kidney disease: Secondary | ICD-10-CM

## 2024-04-27 NOTE — Progress Notes (Unsigned)
 [facility] at [facility] [address] [place],  [place]  [address] [phone]  Clinic Day:  04/28/2024  Referring physician: Elaine Garnette BIRCH., MD   HISTORY OF PRESENT ILLNESS:  The patient is a 48 y.o. 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  Of note, the last time the patient had any type of biopsy/surgery was in December 2023 at [facility].  High-grade squamous epithelial lesions were seen on her left posterior lateral and right lateral perianal skin.  The patient has had previous lesions in and around her anal area removed, none of which have previously contained any form of invasive disease.  The patient denies having any current symptoms which have alerted her to new disease being present.  Previously, erythema and pruritus in her anal region were the symptoms which alerted her to such precancerous lesions being present.  She denies having any such symptoms at this time.  She also denies having anorectal bleeding, fullness, or pain.  She also denies having any changes in her bowel habits.  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  She has undergone 2 previous renal transplants.  Unfortunately, as her last transplanted kidney failed in the late 2023, she has been back on hemodialysis ever since.  PAST MEDICAL 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   AKI (acute kidney injury) (HCC) 08/18/2021   Allergy, unspecified, initial encounter 08/28/2022   Anaphylactic shock, unspecified, initial encounter 08/28/2022   Anemia associated with chronic renal failure    Anemia in chronic kidney disease  08/21/2022   Anorectal disorder 06/08/2020   Anorectal pain 06/08/2020   Anxiety disorder 10/29/2018   Back pain    Bloating 04/16/2018   Breakdown (mechanical) of unspecified cardiac and vascular devices and implants, subsequent encounter 10/12/2022   Cancer (HCC)    pre cervical   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    of the [place]   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 09/05/2022   Carcinoma in situ of vulva 09/05/2022   Chest pain of uncertain etiology 05/09/2022   Chronic kidney disease, stage V (HCC) 11/03/2021   Complications due to cardiac device, implant, and graft 09/02/2012   Condyloma of female genitalia    Dehydration 01/19/2019   Dependence on renal dialysis (HCC) 08/21/2022   Depression    Dialysis patient (HCC)    not currently as of 07/02/22   Drug-induced bleeding disorder (HCC) 01/19/2019   DVT of axillary vein, acute left (HCC) 08/02/2012   Dysmenorrhea 10/19/2015   Dyspnea on exertion 01/19/2019   Dysuria    End stage renal disease (HCC) 09/02/2012   End stage renal disease on dialysis (HCC) 11/05/2022   ESRD (end stage renal disease) (HCC)    sp transplant x 2, did mix of HD and PD from 2001- 2008   Essential hypertension 11/03/2021   Essential hypertension with goal blood pressure less than 130/80    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   Fatigue    Gastritis, acute    GERD without esophagitis 12/20/2021   Gout    Gout due to renal impairment, unspecified site 08/21/2022  History of parathyroidectomy 10/29/2018   History of renal transplant    HTN (hypertension) 01/19/2019   Hypercholesterolemia    Hyperlipidemia    Hyperphosphatemia 12/20/2021   Hypertensive chronic kidney disease with stage 1 through stage 4 chronic kidney disease, or unspecified chronic kidney disease 08/21/2022   Hypokalemia    Hypomagnesemia    Hypothyroid 01/19/2019   IBS (irritable bowel syndrome)    Immunosuppressive management encounter following  kidney transplant 10/29/2018   Internal hemorrhoid 04/22/2018   Iron deficiency anemia 10/19/2015   Irregular uterine bleeding 02/13/2016   Kidney disorder 06/08/2020   Kidney transplant recipient 10/29/2018   1st transplant [facility]) was 1995- 2001.  Did HD/ PD mix from 2001- 2008. 2nd transplant ([facility]/ [facility]) was in 2008 and is still working as of 12/2021. F/b Dr Gearline ([place]) and [facility] transplant team.   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   Nausea and vomiting 01/19/2019   Nausea and vomiting 01/19/2019   Nephrogenic diabetes insipidus (HCC)    congenital   Obesity, Class I, BMI 30-34.9 12/21/2021   Other chronic sinusitis 10/29/2018   Other fluid overload 10/12/2022   Pain, unspecified 08/28/2022   Palpitations    Pancreatic duct dilated 02/15/2022   Pancreatitis    Perianal lesion 04/22/2018   Phlebitis and thrombophlebitis of other sites 08/02/2012   Plantar fasciitis 07/03/2018   Pre-transplant evaluation for kidney transplant 01/09/2022   Preop cardiovascular exam 05/09/2022   Pruritus ani 06/26/2018   Renal failure, chronic 11/03/2021   Dialysis in the past   Right lower quadrant abdominal pain 10/29/2018   [facility] spotted fever    S/p cadaver renal transplant 08/02/2012   Secondary hyperparathyroidism of renal origin [facility])    Secondary hypertension due to renal disease 10/29/2018   Sesamoiditis 07/03/2018   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    Transplanted organ removal status 10/29/2018   Unstable angina (HCC) 01/19/2019   Urinary tract  infection    Vision loss 06/15/2022   Vulvar dystrophy    Yeast dermatitis 08/14/2018    PAST SURGICAL HISTORY:   Past Surgical History:  Procedure Laterality Date   AV FISTULA PLACEMENT     AV FISTULA PLACEMENT Right 05/17/2022   Procedure: RIGHT ARM BRACHIOCEPHALIC ARTERIOVENOUS (AV) FISTULA CREATION;  Surgeon: Lanis Fonda BRAVO, MD;  Location: [facility] OR;  Service: Vascular;  Laterality: Right;   COLONOSCOPY  11/28/2005   [facility]   COLONOSCOPY  04/13/2016   Dr Anette   CORONARY PRESSURE/FFR STUDY N/A 11/29/2022   Procedure: INTRAVASCULAR PRESSURE WIRE/FFR STUDY;  Surgeon: Wendel Lurena POUR, MD;  Location: [facility] INVASIVE CV LAB;  Service: Cardiovascular;  Laterality: N/A;   ESOPHAGOGASTRODUODENOSCOPY  07/31/2017   Distal esophageal ulcers (likely due to gastroeesphageal reflix-biopsied). Small hiatal hernia. Erosive gastritis.   FISTULA SUPERFICIALIZATION Right 07/17/2022   Procedure: SUPERFICIALIZATION AND REVISION RIGHT ARM FISTULA;  Surgeon: Lanis Fonda BRAVO, MD;  Location: [facility] OR;  Service: Vascular;  Laterality: Right;   HEMORRHOID SURGERY     2019, and 04/29/2020   INSERTION OF DIALYSIS CATHETER     KIDNEY  TRANSPLANT  1995, 2008   KNEE SURGERY Left 2020   LEFT HEART CATH AND CORONARY ANGIOGRAPHY N/A 11/29/2022   Procedure: LEFT HEART CATH AND CORONARY ANGIOGRAPHY;  Surgeon: Wendel Lurena POUR, MD;  Location: [facility] INVASIVE CV LAB;  Service: Cardiovascular;  Laterality: N/A;   PARATHYROIDECTOMY  2004   Portion on the parathyroid  gland transplanted in R forearm   REMOVAL OF A DIALYSIS CATHETER     SIMPLE VULVECTOMY  06/19/2011   Partial simple left   SKIN CANCER EXCISION  2016   Per pt, area removed on scalp showed squamous cell carcinoma   TUBAL LIGATION     UPPER GASTROINTESTINAL ENDOSCOPY     WISDOM TOOTH EXTRACTION      CURRENT MEDICATIONS:   Current Outpatient Medications  Medication Sig Dispense Refill   acetaminophen  (TYLENOL ) 500 MG tablet Take 500 mg by mouth every 6  (six) hours as needed for headache (pain).     allopurinol  (ZYLOPRIM ) 100 MG tablet Take 100 mg by mouth daily.     ALPRAZolam  (XANAX ) 0.5 MG tablet Take 0.5 mg by mouth at bedtime.   1   B Complex-C-Folic Acid  (RENA-VITE RX) 1 MG TABS Take 1 tablet by mouth daily.     carvedilol  (COREG ) 6.25 MG tablet Take 1 tablet (6.25 mg total) by mouth 2 (two) times daily. 180 tablet 3   COZAAR 50 MG tablet Take 50 mg by mouth daily.     famotidine  (PEPCID ) 20 MG tablet Take 20 mg by mouth daily.     furosemide (LASIX) 40 MG tablet Take 40 mg by mouth daily as needed for fluid or edema.      gabapentin  (NEURONTIN ) 300 MG capsule Take 300 mg by mouth at bedtime.     hydrALAZINE  (APRESOLINE ) 25 MG tablet Take 1 tablet (25 mg total) by mouth 3 (three) times daily. 270 tablet 3   magnesium  oxide (MAG-OX) 400 (240 Mg) MG tablet Take 400 mg by mouth daily.     medroxyPROGESTERone  (PROVERA ) 10 MG tablet Take 10 mg by mouth daily.  3   nitroGLYCERIN  (NITROSTAT ) 0.4 MG SL tablet Place 1 tablet (0.4 mg total) under the tongue every 5 (five) minutes as needed for chest pain. 25 tablet 6   pantoprazole  (PROTONIX ) 40 MG tablet TAKE 1 TABLET(40 MG) BY MOUTH DAILY 90 tablet 1   promethazine  (PHENERGAN ) 12.5 MG tablet Take 12.5 mg by mouth every 6 (six) hours as needed for nausea or vomiting.     sertraline  (ZOLOFT ) 50 MG tablet Take 50 mg by mouth daily.     sevelamer  carbonate (RENVELA ) 800 MG tablet Take 800 mg by mouth 3 (three) times daily with meals.     simvastatin  (ZOCOR ) 5 MG tablet Take 5 mg by mouth at bedtime.     traZODone  (DESYREL ) 50 MG tablet Take 50 mg by mouth at bedtime.     No current facility-administered medications for this visit.    ALLERGIES:   Allergies  Allergen Reactions   Bactrim [Sulfamethoxazole-Trimethoprim] Anaphylaxis   Sulfamethoxazole Anaphylaxis   Ambien [Zolpidem]     Sleep walking, hallucinations   Hydrogen Peroxide Rash   Labetalol  Hives and Rash   Wound Dressing Adhesive  Hives   Ace Inhibitors Other (See Comments)    Due to Kidney Transplant   Amlodipine Swelling   Hyoscyamine Other (See Comments)    Dizziness    Keflex [Cephalexin] Hives and Swelling    Per pt, her face and lips swell up and she develops  hives   Orphenadrine Hives   Adhesive [Tape] Rash    Can not use for long periods of time (3 days or more)   Imuran [Azathioprine Sodium] Rash   Neosporin [Bacitracin-Polymyxin B] Rash and Other (See Comments)    Can use that for awhile, but will eventually develop a rash    Tramadol Rash   Warfarin Sodium  Rash    FAMILY HISTORY:   Family History  Problem Relation Age of Onset   Hypertension Mother    Hyperlipidemia Mother    Hypercholesterolemia Mother    Diabetes Father    Thyroid  cancer Father    Lung cancer Father    Hypercholesterolemia Father    Hypertension Father    Rectal cancer Maternal Grandmother    Colon cancer Maternal Grandmother    Esophageal cancer Neg Hx    Stomach cancer Neg Hx     SOCIAL HISTORY:  The patient was born and raised in [place].  [place]  She is divorced, with no children.  She previously worked at [employer], but has been on disability for nearly 20 years due to her end-stage renal disease.  There is no history of alcoholism or tobacco abuse.  REVIEW OF SYSTEMS:  Review of Systems  Constitutional:  Positive for fatigue. Negative for fever.  HENT:   Negative for hearing loss and sore throat.   Eyes:  Negative for eye problems.  Respiratory:  Negative for chest tightness, cough and hemoptysis.   Cardiovascular:  Negative for chest pain and palpitations.  Gastrointestinal:  Negative for abdominal distention, abdominal pain, blood in stool, constipation, diarrhea, nausea and vomiting.  Endocrine: Negative for hot flashes.  Genitourinary:  Negative for difficulty urinating, dysuria, frequency, hematuria and nocturia.   Musculoskeletal:  Positive for  arthralgias. Negative for back pain, gait problem and myalgias.  Skin: Negative.  Negative for itching and rash.  Neurological:  Positive for dizziness. Negative for extremity weakness, gait problem, headaches, light-headedness and numbness.  Hematological: Negative.   Psychiatric/Behavioral:  Positive for depression. Negative for suicidal ideas. The patient is nervous/anxious.     PHYSICAL EXAM:  Blood pressure 113/70, pulse 81, temperature 98.3 F (36.8 C), temperature source Oral, resp. rate 14, height 4' 11 (1.499 m), weight 157 lb 8 oz (71.4 kg), last menstrual period 09/13/2013, SpO2 100%. Wt Readings from Last 3 Encounters:  04/28/24 157 lb 8 oz (71.4 kg)  09/19/23 164 lb (74.4 kg)  01/01/23 168 lb 6.4 oz (76.4 kg)   Body mass index is 31.81 kg/m. Performance status (ECOG): 1 - Symptomatic but completely ambulatory Physical Exam Constitutional:      Appearance: Normal appearance. She is not ill-appearing.  HENT:     Mouth/Throat:     Mouth: Mucous membranes are moist.     Pharynx: Oropharynx is clear. No oropharyngeal exudate or posterior oropharyngeal erythema.  Cardiovascular:     Rate and Rhythm: Normal rate and regular rhythm.     Heart sounds: No murmur heard.    No friction rub. No gallop.  Pulmonary:     Effort: Pulmonary effort is normal. No respiratory distress.     Breath sounds: Normal breath sounds. No wheezing, rhonchi or rales.  Abdominal:     General: Bowel sounds are normal. There is no distension.     Palpations: Abdomen is soft. There is no mass.     Tenderness: There is no abdominal tenderness.  Genitourinary:    Rectum: No mass, tenderness  or external hemorrhoid. Normal anal tone.     Comments: A flesh-colored anal skin tag in the form of a 1 cm stalk is seen at the 3 o'clock position just outside of of her anal canal. Musculoskeletal:        General: No swelling.     Right lower leg: No edema.     Left lower leg: No edema.  Lymphadenopathy:      Cervical: No cervical adenopathy.     Upper Body:     Right upper body: No supraclavicular or axillary adenopathy.     Left upper body: No supraclavicular or axillary adenopathy.     Lower Body: No right inguinal adenopathy. No left inguinal adenopathy.  Skin:    General: Skin is warm.     Coloration: Skin is not jaundiced.     Findings: No lesion or rash.  Neurological:     General: No focal deficit present.     Mental Status: She is alert and oriented to person, place, and time. Mental status is at baseline.  Psychiatric:        Mood and Affect: Mood normal.        Behavior: Behavior normal.        Thought Content: Thought content normal.    LABS:      Latest Ref Rng & Units 04/28/2024    2:42 PM 11/27/2022    2:33 PM 11/19/2022    9:29 AM  CBC  WBC 4.0 - 10.5 K/uL 5.4  4.9  5.5   Hemoglobin 12.0 - 15.0 g/dL 87.3  8.8  8.9   Hematocrit 36.0 - 46.0 % 41.4  28.2  28.5   Platelets 150 - 400 K/uL 205  222  199       Latest Ref Rng & Units 04/28/2024    2:42 PM 11/27/2022    2:33 PM 11/19/2022    9:29 AM  CMP  Glucose 70 - 99 mg/dL 885  871  85   BUN 6 - 20 mg/dL 18  12  28    Creatinine 0.44 - 1.00 mg/dL 3.37  4.77  2.65   Sodium 135 - 145 mmol/L 136  142  135   Potassium 3.5 - 5.1 mmol/L 4.7  4.1  4.1   Chloride 98 - 111 mmol/L 99  102  104   CO2 22 - 32 mmol/L 23  20  19    Calcium 8.9 - 10.3 mg/dL 9.9  9.6  9.0   Total Protein 6.5 - 8.1 g/dL 7.3   6.2   Total Bilirubin 0.0 - 1.2 mg/dL 0.5   0.5   Alkaline Phos 38 - 126 U/L 69   72   AST 15 - 41 U/L 14   25   ALT 0 - 44 U/L <5   16    PATHOLOGY: 1) December 2023: Left posterior lateral perianal skin, excision: -High-grade squamous intraepithelial lesion (HSIL/CIS), peripheral edges involved.   Right lateral perianal skin, excision: -High-grade squamous intraepithelial lesion (HSIL/CIS), peripheral edges involved. -------------------------------------------------- 2)February 2022: Anal margin, excision: --Moderate  to severe dysplasia (AIN 2-AIN 3). --Dysplastic changes focally identified at both 02-10-11:00 and 12-3-6:00 margins. --No evidence of invasive malignancy seen on this specimen. --------------------------------------------------- 3) August 2021: Hemorrhoid, left posterior: -High-grade squamous intraepithelial lesion (AIN 2-3), very closely approaching specimen margin  Anus, left anterior margin: -Positive for high-grade squamous intraepithelial lesion (AIN 2-3), specimen margins involved  -------------------------------------------------- 4) September 2019: Anal tissue, right posterior: --Moderate squamous dysplasia (AIN 2). --Negative  for invasive disease. --Associated hemorrhoids noted.   Anal tissue, left lateral: --Moderate squamous dysplasia with HPV effect (AIN 2). --Negative for invasive disease. --Underlying fibrous tissue noted, consistent with hemorrhoidal tag.   Anal tissue, right anterior: --Squamous dysplasia, moderate to severe (AIN 2-3). --Negative for invasive disease. --Underlying fibrous tissue with chronic inflammation noted. ------------------------------------------------ 5) August 2019: 1.  Perianal lesion, biopsy: --Moderate squamous dysplasia (AIN-2). --Negative for invasive carcinoma.  ASSESSMENT & PLAN:  A 48 y.o. female who I was asked to consult upon with respect to providing continued surveillance of her previous high-grade squamous intraepithelial lesions of her anus.  On physical exam today, I did appreciate a flesh-colored lesion at the 3 o'clock position, just outside of her anal orifices, which likely needs to be resected.  I will refer her to either GI/general surgery to have this lesion evaluated and hopefully removed.  Of note, this patient has had no previous pathologic evidence of any invasive anal carcinoma.  Based upon this, I do not believe she needs to continue to be followed by oncology.  She likely needs to be followed closely by GI to  ensure no perianal skin tags/lesions are returning, which may contain invasive carcinoma.  I would not have a problem seeing her in the future if such pathology is ever appreciated to where some form of oncologic intervention would need to be considered.  The patient understands all the plans discussed today and is in agreement with plan.  I do appreciate Elaine Garnette BIRCH., MD for his new consult.   Midori Dado DELENA Kerns, MD

## 2024-04-28 ENCOUNTER — Inpatient Hospital Stay: Attending: Oncology | Admitting: Oncology

## 2024-04-28 ENCOUNTER — Encounter: Payer: Self-pay | Admitting: Oncology

## 2024-04-28 ENCOUNTER — Inpatient Hospital Stay

## 2024-04-28 VITALS — BP 113/70 | HR 81 | Temp 98.3°F | Resp 14 | Ht 59.0 in | Wt 157.5 lb

## 2024-04-28 DIAGNOSIS — Z992 Dependence on renal dialysis: Secondary | ICD-10-CM | POA: Diagnosis not present

## 2024-04-28 DIAGNOSIS — Z940 Kidney transplant status: Secondary | ICD-10-CM | POA: Diagnosis not present

## 2024-04-28 DIAGNOSIS — D013 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: Secondary | ICD-10-CM | POA: Diagnosis not present

## 2024-04-28 DIAGNOSIS — E876 Hypokalemia: Secondary | ICD-10-CM | POA: Diagnosis not present

## 2024-04-28 DIAGNOSIS — N2581 Secondary hyperparathyroidism of renal origin: Secondary | ICD-10-CM | POA: Diagnosis not present

## 2024-04-28 DIAGNOSIS — D631 Anemia in chronic kidney disease: Secondary | ICD-10-CM | POA: Diagnosis not present

## 2024-04-28 DIAGNOSIS — Z79899 Other long term (current) drug therapy: Secondary | ICD-10-CM | POA: Diagnosis not present

## 2024-04-28 DIAGNOSIS — I120 Hypertensive chronic kidney disease with stage 5 chronic kidney disease or end stage renal disease: Secondary | ICD-10-CM | POA: Insufficient documentation

## 2024-04-28 DIAGNOSIS — N186 End stage renal disease: Secondary | ICD-10-CM | POA: Diagnosis not present

## 2024-04-28 LAB — CBC WITH DIFFERENTIAL (CANCER CENTER ONLY)
Abs Immature Granulocytes: 0.01 K/uL (ref 0.00–0.07)
Basophils Absolute: 0.1 K/uL (ref 0.0–0.1)
Basophils Relative: 1 %
Eosinophils Absolute: 0.2 K/uL (ref 0.0–0.5)
Eosinophils Relative: 3 %
HCT: 41.4 % (ref 36.0–46.0)
Hemoglobin: 12.6 g/dL (ref 12.0–15.0)
Immature Granulocytes: 0 %
Lymphocytes Relative: 27 %
Lymphs Abs: 1.4 K/uL (ref 0.7–4.0)
MCH: 28.4 pg (ref 26.0–34.0)
MCHC: 30.4 g/dL (ref 30.0–36.0)
MCV: 93.2 fL (ref 80.0–100.0)
Monocytes Absolute: 0.4 K/uL (ref 0.1–1.0)
Monocytes Relative: 8 %
Neutro Abs: 3.3 K/uL (ref 1.7–7.7)
Neutrophils Relative %: 61 %
Platelet Count: 205 K/uL (ref 150–400)
RBC: 4.44 MIL/uL (ref 3.87–5.11)
RDW: 15.8 % — ABNORMAL HIGH (ref 11.5–15.5)
WBC Count: 5.4 K/uL (ref 4.0–10.5)
nRBC: 0 % (ref 0.0–0.2)

## 2024-04-28 LAB — CMP (CANCER CENTER ONLY)
ALT: <5 U/L (ref 0–44)
AST: 14 U/L — ABNORMAL LOW (ref 15–41)
Albumin: 4.1 g/dL (ref 3.5–5.0)
Alkaline Phosphatase: 69 U/L (ref 38–126)
Anion gap: 15 (ref 5–15)
BUN: 18 mg/dL (ref 6–20)
CO2: 23 mmol/L (ref 22–32)
Calcium: 9.9 mg/dL (ref 8.9–10.3)
Chloride: 99 mmol/L (ref 98–111)
Creatinine: 6.62 mg/dL — ABNORMAL HIGH (ref 0.44–1.00)
GFR, Estimated: 7 mL/min — ABNORMAL LOW (ref >60)
Glucose, Bld: 114 mg/dL — ABNORMAL HIGH (ref 70–99)
Potassium: 4.7 mmol/L (ref 3.5–5.1)
Sodium: 136 mmol/L (ref 135–145)
Total Bilirubin: 0.5 mg/dL (ref 0.0–1.2)
Total Protein: 7.3 g/dL (ref 6.5–8.1)

## 2024-04-28 LAB — FERRITIN: Ferritin: 1022 ng/mL — ABNORMAL HIGH (ref 11–307)

## 2024-04-28 LAB — IRON AND TIBC
Iron: 57 ug/dL (ref 28–170)
Saturation Ratios: 33 % — ABNORMAL HIGH (ref 10.4–31.8)
TIBC: 171 ug/dL — ABNORMAL LOW (ref 250–450)
UIBC: 114 ug/dL

## 2024-04-28 LAB — VITAMIN B12: Vitamin B-12: 780 pg/mL (ref 180–914)

## 2024-04-28 LAB — FOLATE: Folate: 7.5 ng/mL (ref >5.9)

## 2024-04-29 DIAGNOSIS — N2581 Secondary hyperparathyroidism of renal origin: Secondary | ICD-10-CM | POA: Diagnosis not present

## 2024-04-30 LAB — COPPER, SERUM: Copper: 95 ug/dL (ref 80–158)

## 2024-05-01 DIAGNOSIS — Z992 Dependence on renal dialysis: Secondary | ICD-10-CM | POA: Diagnosis not present

## 2024-05-01 DIAGNOSIS — N186 End stage renal disease: Secondary | ICD-10-CM | POA: Diagnosis not present

## 2024-05-01 DIAGNOSIS — E876 Hypokalemia: Secondary | ICD-10-CM | POA: Diagnosis not present

## 2024-05-01 DIAGNOSIS — N2581 Secondary hyperparathyroidism of renal origin: Secondary | ICD-10-CM | POA: Diagnosis not present

## 2024-05-03 DIAGNOSIS — T8610 Unspecified complication of kidney transplant: Secondary | ICD-10-CM | POA: Diagnosis not present

## 2024-05-03 DIAGNOSIS — N186 End stage renal disease: Secondary | ICD-10-CM | POA: Diagnosis not present

## 2024-05-03 DIAGNOSIS — Z992 Dependence on renal dialysis: Secondary | ICD-10-CM | POA: Diagnosis not present

## 2024-05-04 DIAGNOSIS — N2581 Secondary hyperparathyroidism of renal origin: Secondary | ICD-10-CM | POA: Diagnosis not present

## 2024-05-04 DIAGNOSIS — D631 Anemia in chronic kidney disease: Secondary | ICD-10-CM | POA: Diagnosis not present

## 2024-05-04 DIAGNOSIS — Z992 Dependence on renal dialysis: Secondary | ICD-10-CM | POA: Diagnosis not present

## 2024-05-04 DIAGNOSIS — E876 Hypokalemia: Secondary | ICD-10-CM | POA: Diagnosis not present

## 2024-05-04 DIAGNOSIS — N186 End stage renal disease: Secondary | ICD-10-CM | POA: Diagnosis not present

## 2024-05-05 DIAGNOSIS — M47812 Spondylosis without myelopathy or radiculopathy, cervical region: Secondary | ICD-10-CM | POA: Diagnosis not present

## 2024-05-05 DIAGNOSIS — M542 Cervicalgia: Secondary | ICD-10-CM | POA: Diagnosis not present

## 2024-05-05 DIAGNOSIS — Z981 Arthrodesis status: Secondary | ICD-10-CM | POA: Diagnosis not present

## 2024-05-05 DIAGNOSIS — R200 Anesthesia of skin: Secondary | ICD-10-CM | POA: Diagnosis not present

## 2024-05-08 DIAGNOSIS — N186 End stage renal disease: Secondary | ICD-10-CM | POA: Diagnosis not present

## 2024-05-08 DIAGNOSIS — N2581 Secondary hyperparathyroidism of renal origin: Secondary | ICD-10-CM | POA: Diagnosis not present

## 2024-05-08 DIAGNOSIS — E876 Hypokalemia: Secondary | ICD-10-CM | POA: Diagnosis not present

## 2024-05-08 DIAGNOSIS — D631 Anemia in chronic kidney disease: Secondary | ICD-10-CM | POA: Diagnosis not present

## 2024-05-08 DIAGNOSIS — Z992 Dependence on renal dialysis: Secondary | ICD-10-CM | POA: Diagnosis not present

## 2024-05-11 DIAGNOSIS — E876 Hypokalemia: Secondary | ICD-10-CM | POA: Diagnosis not present

## 2024-05-11 DIAGNOSIS — Z992 Dependence on renal dialysis: Secondary | ICD-10-CM | POA: Diagnosis not present

## 2024-05-11 DIAGNOSIS — N186 End stage renal disease: Secondary | ICD-10-CM | POA: Diagnosis not present

## 2024-05-11 DIAGNOSIS — D631 Anemia in chronic kidney disease: Secondary | ICD-10-CM | POA: Diagnosis not present

## 2024-05-11 DIAGNOSIS — N2581 Secondary hyperparathyroidism of renal origin: Secondary | ICD-10-CM | POA: Diagnosis not present

## 2024-05-13 DIAGNOSIS — E876 Hypokalemia: Secondary | ICD-10-CM | POA: Diagnosis not present

## 2024-05-13 DIAGNOSIS — N186 End stage renal disease: Secondary | ICD-10-CM | POA: Diagnosis not present

## 2024-05-13 DIAGNOSIS — D631 Anemia in chronic kidney disease: Secondary | ICD-10-CM | POA: Diagnosis not present

## 2024-05-13 DIAGNOSIS — N2581 Secondary hyperparathyroidism of renal origin: Secondary | ICD-10-CM | POA: Diagnosis not present

## 2024-05-13 DIAGNOSIS — Z992 Dependence on renal dialysis: Secondary | ICD-10-CM | POA: Diagnosis not present

## 2024-05-14 DIAGNOSIS — H353130 Nonexudative age-related macular degeneration, bilateral, stage unspecified: Secondary | ICD-10-CM | POA: Diagnosis not present

## 2024-05-14 DIAGNOSIS — R9082 White matter disease, unspecified: Secondary | ICD-10-CM | POA: Diagnosis not present

## 2024-05-14 DIAGNOSIS — H47011 Ischemic optic neuropathy, right eye: Secondary | ICD-10-CM | POA: Diagnosis not present

## 2024-05-15 DIAGNOSIS — Z992 Dependence on renal dialysis: Secondary | ICD-10-CM | POA: Diagnosis not present

## 2024-05-15 DIAGNOSIS — E876 Hypokalemia: Secondary | ICD-10-CM | POA: Diagnosis not present

## 2024-05-15 DIAGNOSIS — N2581 Secondary hyperparathyroidism of renal origin: Secondary | ICD-10-CM | POA: Diagnosis not present

## 2024-05-15 DIAGNOSIS — N186 End stage renal disease: Secondary | ICD-10-CM | POA: Diagnosis not present

## 2024-05-15 DIAGNOSIS — D631 Anemia in chronic kidney disease: Secondary | ICD-10-CM | POA: Diagnosis not present

## 2024-05-18 DIAGNOSIS — E876 Hypokalemia: Secondary | ICD-10-CM | POA: Diagnosis not present

## 2024-05-18 DIAGNOSIS — N186 End stage renal disease: Secondary | ICD-10-CM | POA: Diagnosis not present

## 2024-05-18 DIAGNOSIS — D631 Anemia in chronic kidney disease: Secondary | ICD-10-CM | POA: Diagnosis not present

## 2024-05-18 DIAGNOSIS — N2581 Secondary hyperparathyroidism of renal origin: Secondary | ICD-10-CM | POA: Diagnosis not present

## 2024-05-18 DIAGNOSIS — Z992 Dependence on renal dialysis: Secondary | ICD-10-CM | POA: Diagnosis not present

## 2024-05-20 DIAGNOSIS — N2581 Secondary hyperparathyroidism of renal origin: Secondary | ICD-10-CM | POA: Diagnosis not present

## 2024-05-20 DIAGNOSIS — N186 End stage renal disease: Secondary | ICD-10-CM | POA: Diagnosis not present

## 2024-05-20 DIAGNOSIS — Z992 Dependence on renal dialysis: Secondary | ICD-10-CM | POA: Diagnosis not present

## 2024-05-20 DIAGNOSIS — D631 Anemia in chronic kidney disease: Secondary | ICD-10-CM | POA: Diagnosis not present

## 2024-05-20 DIAGNOSIS — E876 Hypokalemia: Secondary | ICD-10-CM | POA: Diagnosis not present

## 2024-05-22 DIAGNOSIS — E876 Hypokalemia: Secondary | ICD-10-CM | POA: Diagnosis not present

## 2024-05-22 DIAGNOSIS — N186 End stage renal disease: Secondary | ICD-10-CM | POA: Diagnosis not present

## 2024-05-22 DIAGNOSIS — D631 Anemia in chronic kidney disease: Secondary | ICD-10-CM | POA: Diagnosis not present

## 2024-05-22 DIAGNOSIS — Z992 Dependence on renal dialysis: Secondary | ICD-10-CM | POA: Diagnosis not present

## 2024-05-22 DIAGNOSIS — N2581 Secondary hyperparathyroidism of renal origin: Secondary | ICD-10-CM | POA: Diagnosis not present

## 2024-05-25 DIAGNOSIS — Z992 Dependence on renal dialysis: Secondary | ICD-10-CM | POA: Diagnosis not present

## 2024-05-25 DIAGNOSIS — M109 Gout, unspecified: Secondary | ICD-10-CM | POA: Diagnosis not present

## 2024-05-25 DIAGNOSIS — N2581 Secondary hyperparathyroidism of renal origin: Secondary | ICD-10-CM | POA: Diagnosis not present

## 2024-05-25 DIAGNOSIS — D631 Anemia in chronic kidney disease: Secondary | ICD-10-CM | POA: Diagnosis not present

## 2024-05-25 DIAGNOSIS — D649 Anemia, unspecified: Secondary | ICD-10-CM | POA: Diagnosis not present

## 2024-05-25 DIAGNOSIS — K219 Gastro-esophageal reflux disease without esophagitis: Secondary | ICD-10-CM | POA: Diagnosis not present

## 2024-05-25 DIAGNOSIS — E876 Hypokalemia: Secondary | ICD-10-CM | POA: Diagnosis not present

## 2024-05-25 DIAGNOSIS — N186 End stage renal disease: Secondary | ICD-10-CM | POA: Diagnosis not present

## 2024-05-25 DIAGNOSIS — M159 Polyosteoarthritis, unspecified: Secondary | ICD-10-CM | POA: Diagnosis not present

## 2024-05-25 DIAGNOSIS — I10 Essential (primary) hypertension: Secondary | ICD-10-CM | POA: Diagnosis not present

## 2024-05-29 DIAGNOSIS — Z992 Dependence on renal dialysis: Secondary | ICD-10-CM | POA: Diagnosis not present

## 2024-05-29 DIAGNOSIS — E876 Hypokalemia: Secondary | ICD-10-CM | POA: Diagnosis not present

## 2024-05-29 DIAGNOSIS — D631 Anemia in chronic kidney disease: Secondary | ICD-10-CM | POA: Diagnosis not present

## 2024-05-29 DIAGNOSIS — N2581 Secondary hyperparathyroidism of renal origin: Secondary | ICD-10-CM | POA: Diagnosis not present

## 2024-05-29 DIAGNOSIS — N186 End stage renal disease: Secondary | ICD-10-CM | POA: Diagnosis not present

## 2024-06-01 DIAGNOSIS — Z992 Dependence on renal dialysis: Secondary | ICD-10-CM | POA: Diagnosis not present

## 2024-06-01 DIAGNOSIS — N186 End stage renal disease: Secondary | ICD-10-CM | POA: Diagnosis not present

## 2024-06-01 DIAGNOSIS — D631 Anemia in chronic kidney disease: Secondary | ICD-10-CM | POA: Diagnosis not present

## 2024-06-01 DIAGNOSIS — E876 Hypokalemia: Secondary | ICD-10-CM | POA: Diagnosis not present

## 2024-06-01 DIAGNOSIS — N2581 Secondary hyperparathyroidism of renal origin: Secondary | ICD-10-CM | POA: Diagnosis not present

## 2024-06-02 DIAGNOSIS — Z992 Dependence on renal dialysis: Secondary | ICD-10-CM | POA: Diagnosis not present

## 2024-06-02 DIAGNOSIS — N186 End stage renal disease: Secondary | ICD-10-CM | POA: Diagnosis not present

## 2024-06-02 DIAGNOSIS — T8610 Unspecified complication of kidney transplant: Secondary | ICD-10-CM | POA: Diagnosis not present

## 2024-06-04 ENCOUNTER — Encounter (INDEPENDENT_AMBULATORY_CARE_PROVIDER_SITE_OTHER): Admitting: Ophthalmology

## 2024-06-04 DIAGNOSIS — H353121 Nonexudative age-related macular degeneration, left eye, early dry stage: Secondary | ICD-10-CM

## 2024-06-04 DIAGNOSIS — H43813 Vitreous degeneration, bilateral: Secondary | ICD-10-CM | POA: Diagnosis not present

## 2024-06-04 DIAGNOSIS — H35033 Hypertensive retinopathy, bilateral: Secondary | ICD-10-CM | POA: Diagnosis not present

## 2024-06-04 DIAGNOSIS — H353112 Nonexudative age-related macular degeneration, right eye, intermediate dry stage: Secondary | ICD-10-CM | POA: Diagnosis not present

## 2024-06-04 DIAGNOSIS — H2513 Age-related nuclear cataract, bilateral: Secondary | ICD-10-CM | POA: Diagnosis not present

## 2024-06-04 DIAGNOSIS — I10 Essential (primary) hypertension: Secondary | ICD-10-CM | POA: Diagnosis not present

## 2024-06-09 ENCOUNTER — Encounter (HOSPITAL_COMMUNITY): Payer: Self-pay

## 2024-06-09 DIAGNOSIS — H9193 Unspecified hearing loss, bilateral: Secondary | ICD-10-CM | POA: Diagnosis not present

## 2024-06-11 ENCOUNTER — Encounter (HOSPITAL_COMMUNITY): Payer: Self-pay | Admitting: Vascular Surgery

## 2024-06-11 ENCOUNTER — Other Ambulatory Visit: Payer: Self-pay

## 2024-06-11 ENCOUNTER — Ambulatory Visit (HOSPITAL_COMMUNITY)
Admission: RE | Admit: 2024-06-11 | Discharge: 2024-06-11 | Disposition: A | Discharge status: Home / Self Care | Attending: Vascular Surgery | Admitting: Vascular Surgery

## 2024-06-11 ENCOUNTER — Encounter (HOSPITAL_COMMUNITY)
Admission: RE | Disposition: A | Discharge status: Home / Self Care | Payer: Self-pay | Source: Home / Self Care | Attending: Vascular Surgery

## 2024-06-11 DIAGNOSIS — I871 Compression of vein: Secondary | ICD-10-CM | POA: Diagnosis not present

## 2024-06-11 DIAGNOSIS — I120 Hypertensive chronic kidney disease with stage 5 chronic kidney disease or end stage renal disease: Secondary | ICD-10-CM | POA: Diagnosis not present

## 2024-06-11 DIAGNOSIS — Z59868 Other specified financial insecurity: Secondary | ICD-10-CM | POA: Insufficient documentation

## 2024-06-11 DIAGNOSIS — Z992 Dependence on renal dialysis: Secondary | ICD-10-CM | POA: Diagnosis not present

## 2024-06-11 DIAGNOSIS — Y832 Surgical operation with anastomosis, bypass or graft as the cause of abnormal reaction of the patient, or of later complication, without mention of misadventure at the time of the procedure: Secondary | ICD-10-CM | POA: Insufficient documentation

## 2024-06-11 DIAGNOSIS — N186 End stage renal disease: Secondary | ICD-10-CM | POA: Diagnosis not present

## 2024-06-11 DIAGNOSIS — T82858A Stenosis of vascular prosthetic devices, implants and grafts, initial encounter: Secondary | ICD-10-CM | POA: Diagnosis not present

## 2024-06-11 HISTORY — PX: VENOUS ANGIOPLASTY: CATH118376

## 2024-06-11 HISTORY — PX: VENOUS STENT: CATH118377

## 2024-06-11 HISTORY — PX: A/V FISTULAGRAM: CATH118298

## 2024-06-11 SURGERY — A/V FISTULAGRAM
Anesthesia: LOCAL | Site: Arm Upper | Laterality: Right

## 2024-06-11 MED ORDER — IODIXANOL 320 MG/ML IV SOLN
INTRAVENOUS | Status: DC | PRN
  Administered 2024-06-11: 30 mL via INTRAVENOUS

## 2024-06-11 MED ORDER — LIDOCAINE HCL (PF) 1 % IJ SOLN
INTRAMUSCULAR | Status: AC
  Filled 2024-06-11: qty 30

## 2024-06-11 MED ORDER — HEPARIN (PORCINE) IN NACL 1000-0.9 UT/500ML-% IV SOLN
INTRAVENOUS | Status: DC | PRN
  Administered 2024-06-11: 500 mL

## 2024-06-11 MED ORDER — LIDOCAINE HCL (PF) 1 % IJ SOLN
INTRAMUSCULAR | Status: DC | PRN
  Administered 2024-06-11: 2 mL via SUBCUTANEOUS

## 2024-06-11 SURGICAL SUPPLY — 15 items
BALLOON MUSTANG 10X60X75 (BALLOONS) IMPLANT
BALLOON MUSTANG 8X80X75 (BALLOONS) IMPLANT
CATH BEACON 5 .035 65 KMP TIP (CATHETERS) IMPLANT
CATH SLIP KMP 65CM 5FR (CATHETERS) IMPLANT
GUIDEWIRE ANGLED .035X260CM (WIRE) IMPLANT
KIT ENCORE 26 ADVANTAGE (KITS) IMPLANT
KIT MICROPUNCTURE NIT STIFF (SHEATH) IMPLANT
SHEATH PINNACLE 8F 10CM (SHEATH) IMPLANT
SHEATH PINNACLE R/O II 7F 4CM (SHEATH) IMPLANT
SHEATH PROBE COVER 6X72 (BAG) IMPLANT
STENT VIABAHN 9X5X120 (Permanent Stent) IMPLANT
STOPCOCK MORSE 400PSI 3WAY (MISCELLANEOUS) IMPLANT
TRAY PV CATH (CUSTOM PROCEDURE TRAY) ×2 IMPLANT
TUBING CIL FLEX 10 FLL-RA (TUBING) IMPLANT
WIRE BENTSON .035X145CM (WIRE) IMPLANT

## 2024-06-11 NOTE — Op Note (Signed)
    Patient name: Ariel [name] MRN: [id number] DOB: 10/04/1975 Sex: female  06/11/2024 Pre-operative Diagnosis: End-stage renal disease, malfunctioning right arm cephalic vein fistula Post-operative diagnosis:  Same Surgeon:  Penne BROCKS. Sheree, MD Procedure Performed: 1.  Percutaneous ultrasound-guided cannulation right arm AV fistula 2.  Right upper extremity fistulogram 3.  Plain balloon angioplasty right innominate and subclavian veins with 10 mm balloon and peripheral cephalic vein with 8 mm balloon 4.  Stent cephalic arch with 9 x 50 mm Viabahn postdilated with 8 mm balloon  Indications: 48 year old female with end-stage renal disease currently dialyzing via right arm AV fistula.  She has had pain with pulsatility in the fistula we have discussed her options of fistulogram versus continued use she is elected for fistulogram with possible intervention.  Findings: Fistula proximally is patent there is some tortuosity of the anastomosis but there is very strong inflow.  Towards the proximal humerus there is 1 area that appears to be a valve this was ballooned with no resolution but there is brisk flow across this.  The cephalic arch is heavily diseased approximately 80% stenosis with collateralization.  After balloon angioplasty there was residual stenosis and this was stented to the 70% residual stenosis.  There is a 50% residual stenosis of the thoracic outlet in the innominate and subclavian veins and this was ballooned with 10 mm balloon and remains 50% stenosis.  Fistula remains amenable to future percutaneous intervention.   Procedure:  The patient was identified in the holding area and taken to the heart and vascular procedure room.  The patient was then placed supine on the table and prepped and draped in the usual sterile fashion.  A time out was called.  Ultrasound was used to evaluate the right arm AV fistula.  Near the antecubitum we anesthetized the skin with 1% lidocaine  and  cannulated with the fistula with micropuncture needle followed by wire and a sheath.  An ultrasound images saved the permanent record.  Right upper extremity fistulogram was performed with the above findings.  I then placed a Bentson wire followed by initially a 7 French sheath and used a Kumpe catheter to direct the wire centrally.  We then performed balloon angioplasty centrally with 10 mm balloon of the innominate and subclavian veins followed by 8 mm balloon angioplasty of both the cephalic arch and the peripheral cephalic vein near the proximal humerus.  While the balloon was inflated most peripherally we performed retrograde angiography and this demonstrated brisk inflow.  Completion demonstrated residual stenosis at the cephalic arch we elected for stenting.  We exchanged for an 8 French sheath and then placed the Glidewire into the IVC under fluoroscopic guidance using a Kumpe catheter.  We then primarily stented the cephalic arch with a 9 x 50 mm stent followed by postdilatation with 8 mm balloon.  Completion demonstrated no residual stenosis and there was now brisk thrill in the fistula.  Satisfied with this we removed the wire and the sheath and suture-ligated the cannulation site.  She tolerated the procedure without any complication.  Contrast: 30cc   Rihaan Barrack C. Sheree, MD [facility] Office: [phone] Pager: [phone]

## 2024-06-11 NOTE — H&P (Signed)
 H+P  History of Present Illness: This is a 48 y.o. female on dialysis via right upper arm two-stage basilic vein fistula.  Recently she has increased pain with dialysis in her right neck and chest area.  She continues to use the fistula for dialysis without increased bleeding time.  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   AKI (acute kidney injury) 08/18/2021   Allergy, unspecified, initial encounter 08/28/2022   Anaphylactic shock, unspecified, initial encounter 08/28/2022   Anemia associated with chronic renal failure    Anemia in chronic kidney disease 08/21/2022   Anorectal disorder 06/08/2020   Anorectal pain 06/08/2020   Anxiety disorder 10/29/2018   Back pain    Bloating 04/16/2018   Breakdown (mechanical) of unspecified cardiac and vascular devices and implants, subsequent encounter 10/12/2022   Cancer (HCC)    pre cervical   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    of the [place]   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 09/05/2022   Carcinoma in situ of vulva 09/05/2022   Chest pain of uncertain etiology 05/09/2022   Chronic kidney disease, stage V (HCC) 11/03/2021   Complications due to cardiac device, implant, and graft 09/02/2012   Condyloma of female genitalia    Dehydration 01/19/2019   Dependence on renal dialysis 08/21/2022   Depression    Dialysis patient    not currently as of 07/02/22   Drug-induced bleeding disorder 01/19/2019   DVT of axillary vein, acute left (HCC) 08/02/2012   Dysmenorrhea 10/19/2015   Dyspnea on exertion 01/19/2019   Dysuria    End stage renal disease (HCC) 09/02/2012   End stage renal disease on dialysis (HCC) 11/05/2022   ESRD (end stage renal disease) (HCC)    sp transplant x 2, did mix of HD and PD from 2001- 2008   Essential hypertension 11/03/2021   Essential hypertension with goal blood pressure less than  130/80    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   Fatigue    Gastritis, acute    GERD without esophagitis 12/20/2021   Gout    Gout due to renal impairment, unspecified site 08/21/2022   History of parathyroidectomy 10/29/2018   History of renal transplant    HTN (hypertension) 01/19/2019   Hypercholesterolemia    Hyperlipidemia    Hyperphosphatemia 12/20/2021   Hypertensive chronic kidney disease with stage 1 through stage 4 chronic kidney disease, or unspecified chronic kidney disease 08/21/2022   Hypokalemia    Hypomagnesemia    Hypothyroid 01/19/2019   IBS (irritable bowel syndrome)    Immunosuppressive management encounter following kidney transplant 10/29/2018   Internal hemorrhoid 04/22/2018   Iron deficiency anemia 10/19/2015   Irregular uterine bleeding 02/13/2016   Kidney disorder 06/08/2020   Kidney transplant recipient 10/29/2018   1st transplant [facility]) was 1995- 2001.  Did HD/ PD mix from 2001- 2008. 2nd transplant ([facility]/ [facility]) was in 2008 and is still working as of 12/2021. F/b Dr Gearline ([place]) and [facility] transplant team.   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   Nausea and vomiting 01/19/2019   Nausea and vomiting 01/19/2019   Nephrogenic diabetes insipidus    congenital   Obesity, Class I, BMI 30-34.9 12/21/2021   Other chronic sinusitis 10/29/2018   Other fluid overload 10/12/2022   Pain, unspecified 08/28/2022   Palpitations    Pancreatic duct dilated 02/15/2022   Pancreatitis    Perianal lesion 04/22/2018   Phlebitis and thrombophlebitis of other sites  08/02/2012   Plantar fasciitis 07/03/2018   Pre-transplant evaluation for kidney transplant 01/09/2022   Preop cardiovascular exam 05/09/2022   Pruritus ani 06/26/2018   Renal failure, chronic 11/03/2021   Dialysis in the past   Right lower quadrant abdominal pain 10/29/2018   [facility] spotted fever    S/p cadaver renal transplant 08/02/2012   Secondary hyperparathyroidism of renal origin    Secondary hypertension due to renal disease 10/29/2018   Sesamoiditis 07/03/2018   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    Transplanted organ removal status 10/29/2018   Unstable angina (HCC) 01/19/2019   Urinary tract infection    Vision loss 06/15/2022   Vulvar dystrophy    Yeast dermatitis 08/14/2018    Past Surgical History:  Procedure Laterality Date   AV FISTULA PLACEMENT     AV FISTULA PLACEMENT Right 05/17/2022   Procedure: RIGHT ARM BRACHIOCEPHALIC ARTERIOVENOUS (AV) FISTULA CREATION;  Surgeon: Lanis Fonda BRAVO, MD;  Location: [facility] OR;  Service: Vascular;  Laterality: Right;   COLONOSCOPY  11/28/2005   [facility]   COLONOSCOPY  04/13/2016   Dr Anette   CORONARY PRESSURE/FFR STUDY N/A 11/29/2022   Procedure: INTRAVASCULAR PRESSURE WIRE/FFR STUDY;  Surgeon: Wendel Lurena POUR, MD;  Location: [facility] INVASIVE CV LAB;  Service: Cardiovascular;  Laterality: N/A;   ESOPHAGOGASTRODUODENOSCOPY  07/31/2017   Distal esophageal ulcers (likely due to gastroeesphageal reflix-biopsied). Small hiatal hernia. Erosive gastritis.   FISTULA SUPERFICIALIZATION Right 07/17/2022   Procedure: SUPERFICIALIZATION AND REVISION RIGHT ARM FISTULA;  Surgeon: Lanis Fonda BRAVO, MD;  Location: [facility] OR;  Service: Vascular;  Laterality: Right;   HEMORRHOID SURGERY     2019, and 04/29/2020   INSERTION OF DIALYSIS CATHETER     KIDNEY TRANSPLANT  1995, 2008   KNEE SURGERY Left 2020   LEFT HEART CATH AND CORONARY ANGIOGRAPHY N/A 11/29/2022   Procedure: LEFT HEART CATH AND CORONARY ANGIOGRAPHY;  Surgeon:  Wendel Lurena POUR, MD;  Location: [facility] INVASIVE CV LAB;  Service: Cardiovascular;  Laterality: N/A;   PARATHYROIDECTOMY  2004   Portion on the parathyroid  gland transplanted in R forearm   REMOVAL OF A DIALYSIS CATHETER     SIMPLE VULVECTOMY  06/19/2011   Partial simple left   SKIN CANCER EXCISION  2016   Per pt, area removed on scalp showed squamous cell carcinoma   TUBAL LIGATION     UPPER GASTROINTESTINAL ENDOSCOPY     WISDOM TOOTH EXTRACTION      Allergies  Allergen Reactions   Bactrim [Sulfamethoxazole-Trimethoprim] Anaphylaxis   Sulfamethoxazole Anaphylaxis   Ambien [Zolpidem]     Sleep walking, hallucinations   Hydrogen Peroxide Rash   Labetalol  Hives and Rash   Wound Dressing Adhesive Hives   Ace Inhibitors Other (See Comments)    Due to Kidney Transplant   Amlodipine Swelling   Hyoscyamine Other (See Comments)    Dizziness    Keflex [Cephalexin] Hives and Swelling    Per pt, her face and lips swell up and she develops hives  Orphenadrine Hives   Adhesive [Tape] Rash    Can not use for long periods of time (3 days or more)   Imuran [Azathioprine Sodium] Rash   Neosporin [Bacitracin-Polymyxin B] Rash and Other (See Comments)    Can use that for awhile, but will eventually develop a rash    Tramadol Rash   Warfarin Sodium  Rash    Prior to Admission medications   Medication Sig Start Date End Date Taking? Authorizing Provider  acetaminophen  (TYLENOL ) 500 MG tablet Take 500 mg by mouth every 6 (six) hours as needed for headache (pain).    [provider]  allopurinol  (ZYLOPRIM ) 100 MG tablet Take 100 mg by mouth daily. 10/13/20   [provider]  ALPRAZolam  (XANAX ) 0.5 MG tablet Take 0.5 mg by mouth at bedtime.  05/31/15   [provider]  B Complex-C-Folic Acid  (RENA-VITE RX) 1 MG TABS Take 1 tablet by mouth daily. 09/26/22   [provider]  carvedilol  (COREG ) 6.25 MG tablet Take 1 tablet (6.25 mg total) by mouth 2 (two) times daily.  09/19/23   Carlin Delon BROCKS, NP  COZAAR 50 MG tablet Take 50 mg by mouth daily. 09/10/23   [provider]  famotidine  (PEPCID ) 20 MG tablet Take 20 mg by mouth daily.    [provider]  furosemide (LASIX) 40 MG tablet Take 40 mg by mouth daily as needed for fluid or edema.     [provider]  gabapentin  (NEURONTIN ) 300 MG capsule Take 300 mg by mouth at bedtime.    [provider]  hydrALAZINE  (APRESOLINE ) 25 MG tablet Take 1 tablet (25 mg total) by mouth 3 (three) times daily. 09/19/23 12/18/23  Carlin Delon BROCKS, NP  magnesium  oxide (MAG-OX) 400 (240 Mg) MG tablet Take 400 mg by mouth daily.    [provider]  medroxyPROGESTERone  (PROVERA ) 10 MG tablet Take 10 mg by mouth daily. 04/09/17   [provider]  nitroGLYCERIN  (NITROSTAT ) 0.4 MG SL tablet Place 1 tablet (0.4 mg total) under the tongue every 5 (five) minutes as needed for chest pain. 11/21/22   Revankar, Jennifer SAUNDERS, MD  pantoprazole  (PROTONIX ) 40 MG tablet TAKE 1 TABLET(40 MG) BY MOUTH DAILY 03/08/23   Charlanne Groom, MD  promethazine  (PHENERGAN ) 12.5 MG tablet Take 12.5 mg by mouth every 6 (six) hours as needed for nausea or vomiting.    [provider]  sertraline  (ZOLOFT ) 50 MG tablet Take 50 mg by mouth daily.    [provider]  sevelamer  carbonate (RENVELA ) 800 MG tablet Take 800 mg by mouth 3 (three) times daily with meals. 01/04/22   [provider]  simvastatin  (ZOCOR ) 5 MG tablet Take 5 mg by mouth at bedtime. 11/08/21   [provider]  traZODone  (DESYREL ) 50 MG tablet Take 50 mg by mouth at bedtime.    [provider]    Social History   Socioeconomic History   Marital status: Divorced    Spouse name: Not on file   Number of children: 0   Years of education: 29 + SOME COLLEGE   Highest education level: Not on file  Occupational History   Occupation: DISABLED  Tobacco Use   Smoking status: Never    Passive exposure: Never    Smokeless tobacco: Never  Vaping Use   Vaping status: Never Used  Substance and Sexual Activity   Alcohol use: No   Drug use: No   Sexual activity: Not Currently    Birth control/protection: Post-menopausal  Other Topics Concern   Not on file  Social History Narrative   Not on file   Social Drivers of Health   Financial Resource Strain: High Risk (10/16/2021)   Received from [facility]   Overall Financial Resource Strain (CARDIA)    Difficulty of Paying Living Expenses: Hard  Food Insecurity: Low Risk  (02/05/2024)   Received from [facility]   Hunger Vital Sign    Within the past 12 months, you worried that your food would run out before you got money to buy more: Never true    Within the past 12 months, the food you bought just didn't last and you didn't have money to get more. : Never true  Transportation Needs: No Transportation Needs (02/05/2024)   Received from [facility]    In the past 12 months, has lack of reliable transportation kept you from medical appointments, meetings, work or from getting things needed for daily living? : No  Physical Activity: Inactive (10/16/2021)   Received from [facility]   Exercise Vital Sign    On average, how many days per week do you engage in moderate to strenuous exercise (like a brisk walk)?: 0 days    On average, how many minutes do you engage in exercise at this level?: 0 min  Stress: Stress Concern Present (10/16/2021)   Received from [facility] Occup[facility] - Occupational Stress Questionnaire    Feeling of Stress : Very much  Social Connections: Unknown (01/01/2022)   Received from [facility]   Social Network    Social Network: Not on file  Recent Concern: Social Connections - Moderately Isolated (10/16/2021)   Received from [facility]   Social Connection and Isolation Panel    In a typical week, how many times do you talk on the phone with family, friends, or  neighbors?: More than three times a week    How often do you get together with friends or relatives?: Twice a week    How often do you attend church or religious services?: More than 4 times per year    Do you belong to any clubs or organizations such as church groups, unions, fraternal or athletic groups, or school groups?: No    How often do you attend meetings of the clubs or organizations you belong to?: Never    Are you married, widowed, divorced, separated, never married, or living with a partner?: Divorced  Intimate Partner Violence: Unknown (12/07/2021)   Received from [facility]   HITS    Physically Hurt: Not on file    Insult or Talk Down To: Not on file    Threaten Physical Harm: Not on file    Scream or Curse: Not on file     Family History  Problem Relation Age of Onset   Hypertension Mother    Hyperlipidemia Mother    Hypercholesterolemia Mother    Diabetes Father    Thyroid  cancer Father    Lung cancer Father    Hypercholesterolemia Father    Hypertension Father    Rectal cancer Maternal Grandmother    Colon cancer Maternal Grandmother    Esophageal cancer Neg Hx    Stomach cancer Neg Hx     ROS: Pain on dialysis as above   Physical Examination  Vitals:   06/11/24 0905 06/11/24 0913  BP: 134/78 134/78  Pulse: 86 85  Resp: 12 14  Temp: 98 F (36.7 C)   SpO2:  98% 98%   There is no height or weight on file to calculate BMI.  Awake alert and oriented Nonlabored respirations Right upper extremity fistula is patent there is pulsatility towards the axilla  CBC    Component Value Date/Time   WBC 5.4 04/28/2024 1442   WBC 5.5 11/19/2022 0929   RBC 4.44 04/28/2024 1442   HGB 12.6 04/28/2024 1442   HGB 8.8 (L) 11/27/2022 1433   HCT 41.4 04/28/2024 1442   HCT 28.2 (L) 11/27/2022 1433   PLT 205 04/28/2024 1442   PLT 222 11/27/2022 1433   MCV 93.2 04/28/2024 1442   MCV 94 11/27/2022 1433   MCH 28.4 04/28/2024 1442   MCHC 30.4 04/28/2024 1442   RDW  15.8 (H) 04/28/2024 1442   RDW 14.6 11/27/2022 1433   LYMPHSABS 1.4 04/28/2024 1442   MONOABS 0.4 04/28/2024 1442   EOSABS 0.2 04/28/2024 1442   BASOSABS 0.1 04/28/2024 1442    BMET    Component Value Date/Time   NA 136 04/28/2024 1442   NA 142 11/27/2022 1433   K 4.7 04/28/2024 1442   CL 99 04/28/2024 1442   CO2 23 04/28/2024 1442   GLUCOSE 114 (H) 04/28/2024 1442   BUN 18 04/28/2024 1442   BUN 12 11/27/2022 1433   CREATININE 6.62 (H) 04/28/2024 1442   CALCIUM 9.9 04/28/2024 1442   GFRNONAA 7 (L) 04/28/2024 1442   GFRAA 23 (L) 03/10/2020 2052    COAGS: Lab Results  Component Value Date   INR 0.97 10/03/2013   INR 2.71 (H) 08/08/2012   INR 1.59 (H) 08/07/2012    ASSESSMENT/PLAN: This is a 48 y.o. female dialysis via right upper extremity AV fistula.  She is now having pain during dialysis.  She is sent here for further evaluation.  We have discussed her options including fistulogram and we discussed the risk and benefits of the procedure and she demonstrates good understanding and agrees to proceed and consent was signed.  Yanet Balliet C. Sheree, MD [facility] Office: [phone] Pager: [phone]

## 2024-06-16 NOTE — Progress Notes (Signed)
 Sent message, via epic in basket, requesting orders in epic from Careers adviser.

## 2024-06-17 ENCOUNTER — Other Ambulatory Visit: Payer: Self-pay | Admitting: Orthopedic Surgery

## 2024-06-17 ENCOUNTER — Encounter (HOSPITAL_COMMUNITY): Payer: Self-pay

## 2024-06-17 DIAGNOSIS — N186 End stage renal disease: Secondary | ICD-10-CM | POA: Diagnosis not present

## 2024-06-17 DIAGNOSIS — Z7682 Awaiting organ transplant status: Secondary | ICD-10-CM | POA: Diagnosis not present

## 2024-06-17 NOTE — Patient Instructions (Addendum)
 SURGICAL WAITING ROOM VISITATION  Patients having surgery or a procedure may have no more than 2 support people in the waiting area - these visitors may rotate.    Children under the age of 33 must have an adult with them who is not the patient.  Visitors with respiratory illnesses are discouraged from visiting and should remain at home.  If the patient needs to stay at the hospital during part of their recovery, the visitor guidelines for inpatient rooms apply. Pre-op  nurse will coordinate an appropriate time for 1 support person to accompany patient in pre-op .  This support person may not rotate.    Please refer to the [facility] website for the visitor guidelines for Inpatients (after your surgery is over and you are in a regular room).       Your procedure is scheduled on: 07-06-24    Report to [facility] Main Entrance    Report to admitting at       0745 AM   Call this number if you have problems the morning of surgery [phone]   Do not eat food :After Midnight.   After Midnight you may have the following  clear liquids until _0715_____ AM/ DAY OF SURGERY  then nothing by mouth  Water  Non-Citrus Juices (without pulp, NO RED-Apple, White grape, White cranberry) Black Coffee (NO MILK/CREAM OR CREAMERS, sugar ok)  Clear Tea (NO MILK/CREAM OR CREAMERS, sugar ok) regular and decaf                             Plain Jell-O (NO RED)                                           Fruit ices (not with fruit pulp, NO RED)                                     Popsicles (NO RED)                                                               Sports drinks like Gatorade (NO RED)                    The day of surgery:  Drink ONE (1) Pre-Surgery Clear Ensure BY 0715 AM the morning of surgery. Drink in one sitting. Do not sip.  This drink was given to you during your hospital  pre-op  appointment visit. Nothing else to drink after completing the  Pre-Surgery Clear Ensure            If you have questions, please contact your surgeon's office.   FOLLOW  ANY ADDITIONAL PRE OP INSTRUCTIONS YOU RECEIVED FROM YOUR SURGEON'S OFFICE!!!     Oral Hygiene is also important to reduce your risk of infection.                                    Remember - BRUSH YOUR TEETH THE MORNING OF SURGERY  WITH YOUR REGULAR TOOTHPASTE  DENTURES WILL BE REMOVED PRIOR TO SURGERY PLEASE DO NOT APPLY Poly grip OR ADHESIVES!!!      Stop all vitamins and herbal supplements 7 days before surgery.   Take these medicines the morning of surgery with A SIP OF WATER : Sertraline , pantoprazole , medroxyprogesterone ,  famotidine  if needed, carvedilol , allopurinol                                  You may not have any metal on your body including hair pins, jewelry, and body piercing             Do not wear make-up, lotions, powders, perfumes/cologne, or deodorant  Do not wear nail polish including gel and S&S, artificial/acrylic nails, or any other type of covering on natural nails including finger and toenails. If you have artificial nails, gel coating, etc. that needs to be removed by a nail salon please have this removed prior to surgery or surgery may need to be canceled/ delayed if the surgeon/ anesthesia feels like they are unable to be safely monitored.   Do not shave  4 days prior to surgery.                Do not bring valuables to the hospital. [facility] IS NOT             RESPONSIBLE   FOR VALUABLES.   Contacts, glasses, dentures or bridgework may not be worn into surgery.   Bring small overnight bag day of surgery.   DO NOT BRING YOUR HOME MEDICATIONS TO THE HOSPITAL. PHARMACY WILL DISPENSE MEDICATIONS LISTED ON YOUR MEDICATION LIST TO YOU DURING YOUR ADMISSION IN THE HOSPITAL!    Patients discharged on the day of surgery will not be allowed to drive home.  Someone NEEDS to stay with you for the first 24 hours after anesthesia.   Special Instructions: Bring a copy of your  healthcare power of attorney and living will documents the day of surgery if you haven't scanned them before.              Please read over the following fact sheets you were given: IF YOU HAVE QUESTIONS ABOUT YOUR PRE-OP  INSTRUCTIONS PLEASE CALL [phone].    If you test positive for Covid or have been in contact with anyone that has tested positive in the last 10 days please notify you surgeon.      Pre-operative 4 CHG Bath Instructions  DYNA-Hex 4 Chlorhexidine  Gluconate 4% Solution Antiseptic 4 fl. oz   You can play a key role in reducing the risk of infection after surgery. Your skin needs to be as free of germs as possible. You can reduce the number of germs on your skin by washing with CHG (chlorhexidine  gluconate) soap before surgery. CHG is an antiseptic soap that kills germs and continues to kill germs even after washing.   DO NOT use if you have an allergy to chlorhexidine /CHG or antibacterial soaps. If your skin becomes reddened or irritated, stop using the CHG and notify one of our RNs at   Please shower with the CHG soap starting 4 days before surgery using the following schedule:     Please keep in mind the following:  DO NOT shave, including legs and underarms, starting the day of your first shower.   You may shave your face at any point before/day of surgery.  Place clean sheets on your  bed the day you start using CHG soap. Use a clean washcloth (not used since being washed) for each shower. DO NOT sleep with pets once you start using the CHG.  CHG Shower Instructions:  If you choose to wash your hair and private area, wash first with your normal shampoo/soap.  After you use shampoo/soap, rinse your hair and body thoroughly to remove shampoo/soap residue.  Turn the water  OFF and apply about 3 tablespoons (45 ml) of CHG soap to a CLEAN washcloth.  Apply CHG soap ONLY FROM YOUR NECK DOWN TO YOUR TOES (washing for 3-5 minutes)  DO NOT use CHG soap on face, private areas,  open wounds, or sores.  Pay special attention to the area where your surgery is being performed.  If you are having back surgery, having someone wash your back for you may be helpful. Wait 2 minutes after CHG soap is applied, then you may rinse off the CHG soap.  Pat dry with a clean towel  Put on clean clothes/pajamas   If you choose to wear lotion, please use ONLY the CHG-compatible lotions on the back of this paper.     Additional instructions for the day of surgery: DO NOT APPLY any lotions, deodorants, cologne, or perfumes.   Put on clean/comfortable clothes.  Brush your teeth.  Ask your nurse before applying any prescription medications to the skin.   CHG Compatible Lotions   Aveeno Moisturizing lotion  Cetaphil Moisturizing Cream  Cetaphil Moisturizing Lotion  Clairol Herbal Essence Moisturizing Lotion, Dry Skin  Clairol Herbal Essence Moisturizing Lotion, Extra Dry Skin  Clairol Herbal Essence Moisturizing Lotion, Normal Skin  Curel Age Defying Therapeutic Moisturizing Lotion with Alpha Hydroxy  Curel Extreme Care Body Lotion  Curel Soothing Hands Moisturizing Hand Lotion  Curel Therapeutic Moisturizing Cream, Fragrance-Free  Curel Therapeutic Moisturizing Lotion, Fragrance-Free  Curel Therapeutic Moisturizing Lotion, Original Formula  Eucerin Daily Replenishing Lotion  Eucerin Dry Skin Therapy Plus Alpha Hydroxy Crme  Eucerin Dry Skin Therapy Plus Alpha Hydroxy Lotion  Eucerin Original Crme  Eucerin Original Lotion  Eucerin Plus Crme Eucerin Plus Lotion  Eucerin TriLipid Replenishing Lotion  Keri Anti-Bacterial Hand Lotion  Keri Deep Conditioning Original Lotion Dry Skin Formula Softly Scented  Keri Deep Conditioning Original Lotion, Fragrance Free Sensitive Skin Formula  Keri Lotion Fast Absorbing Fragrance Free Sensitive Skin Formula  Keri Lotion Fast Absorbing Softly Scented Dry Skin Formula  Keri Original Lotion  Keri Skin Renewal Lotion Keri Silky Smooth  Lotion  Keri Silky Smooth Sensitive Skin Lotion  Nivea Body Creamy Conditioning Oil  Nivea Body Extra Enriched Lotion  Nivea Body Original Lotion  Nivea Body Sheer Moisturizing Lotion Nivea Crme  Nivea Skin Firming Lotion  NutraDerm 30 Skin Lotion  NutraDerm Skin Lotion  NutraDerm Therapeutic Skin Cream  NutraDerm Therapeutic Skin Lotion  ProShield Protective Hand Cream  Incentive Spirometer  An incentive spirometer is a tool that can help keep your lungs clear and active. This tool measures how well you are filling your lungs with each breath. Taking long deep breaths may help reverse or decrease the chance of developing breathing (pulmonary) problems (especially infection) following: A long period of time when you are unable to move or be active. BEFORE THE PROCEDURE  If the spirometer includes an indicator to show your best effort, your nurse or respiratory therapist will set it to a desired goal. If possible, sit up straight or lean slightly forward. Try not to slouch. Hold the incentive spirometer in an upright position.  INSTRUCTIONS FOR USE  Sit on the edge of your bed if possible, or sit up as far as you can in bed or on a chair. Hold the incentive spirometer in an upright position. Breathe out normally. Place the mouthpiece in your mouth and seal your lips tightly around it. Breathe in slowly and as deeply as possible, raising the piston or the ball toward the top of the column. Hold your breath for 3-5 seconds or for as long as possible. Allow the piston or ball to fall to the bottom of the column. Remove the mouthpiece from your mouth and breathe out normally. Rest for a few seconds and repeat Steps 1 through 7 at least 10 times every 1-2 hours when you are awake. Take your time and take a few normal breaths between deep breaths. The spirometer may include an indicator to show your best effort. Use the indicator as a goal to work toward during each repetition. After each set  of 10 deep breaths, practice coughing to be sure your lungs are clear. If you have an incision (the cut made at the time of surgery), support your incision when coughing by placing a pillow or rolled up towels firmly against it. Once you are able to get out of bed, walk around indoors and cough well. You may stop using the incentive spirometer when instructed by your caregiver.  RISKS AND COMPLICATIONS Take your time so you do not get dizzy or light-headed. If you are in pain, you may need to take or ask for pain medication before doing incentive spirometry. It is harder to take a deep breath if you are having pain. AFTER USE Rest and breathe slowly and easily. It can be helpful to keep track of a log of your progress. Your caregiver can provide you with a simple table to help with this. If you are using the spirometer at home, follow these instructions: SEEK MEDICAL CARE IF:  You are having difficultly using the spirometer. You have trouble using the spirometer as often as instructed. Your pain medication is not giving enough relief while using the spirometer. You develop fever of 100.5 F (38.1 C) or higher. SEEK IMMEDIATE MEDICAL CARE IF:  You cough up bloody sputum that had not been present before. You develop fever of 102 F (38.9 C) or greater. You develop worsening pain at or near the incision site. MAKE SURE YOU:  Understand these instructions. Will watch your condition. Will get help right away if you are not doing well or get worse. Document Released: 12/31/2006 Document Revised: 11/12/2011 Document Reviewed: 03/03/2007 [facility] Patient Information 2014 [place], [place].   ________________________________________________________________________

## 2024-06-17 NOTE — Telephone Encounter (Signed)
 Attempted to call pt to discuss plan for removal of new anal lesion - LM to call me back.

## 2024-06-17 NOTE — Progress Notes (Addendum)
 PCP - Rea Ruth, MD Cardiologist - Dr. Edwyna ronco 11-21-22   Rexie Barters, MD  PPM/ICD -  Device Orders -  Rep Notified -   Chest x-ray - 11-19-22 epic EKG - 11-15-23 epic Stress Test -  ECHO - 10-10-23 epic Cardiac Cath -   Sleep Study - n/a CPAP -   Fasting Blood Sugar -  Checks Blood Sugar __n/a___ times a dy  Blood Thinner Instructions: Aspirin  Instructions:81 MG ASA TAKES OCCASIONAL   ERAS Protcol - PRE-SURGERY Ensure  DONE AS PHONE CALL PT. FORGOT APPT. COMING TO PICKUP SOAP AND INSTRUCTIONS 06-24-24 AT 1 PM  COVID vaccine -  Activity--Able to climb a flight of stairs with no CP or SOB Anesthesia review: Dialysis M,W,F, Murmur, HTN, DVT, ESRD ,RUE fistula   Patient denies shortness of breath, fever, cough and chest pain at PAT appointment   All instructions explained to the patient, with a verbal understanding of the material. Patient agrees to go over the instructions while at home for a better understanding. Patient also instructed to self quarantine after being tested for COVID-19. The opportunity to ask questions was provided.

## 2024-06-22 ENCOUNTER — Other Ambulatory Visit: Payer: Self-pay | Admitting: Orthopedic Surgery

## 2024-06-23 ENCOUNTER — Encounter (HOSPITAL_COMMUNITY): Payer: Self-pay

## 2024-06-23 ENCOUNTER — Other Ambulatory Visit: Payer: Self-pay

## 2024-06-23 ENCOUNTER — Encounter (HOSPITAL_COMMUNITY)
Admission: RE | Admit: 2024-06-23 | Discharge: 2024-06-23 | Disposition: A | Discharge status: Home / Self Care | Source: Ambulatory Visit | Attending: Anesthesiology | Admitting: Anesthesiology

## 2024-06-23 VITALS — Ht 59.0 in | Wt 159.0 lb

## 2024-06-23 DIAGNOSIS — Z01818 Encounter for other preprocedural examination: Secondary | ICD-10-CM

## 2024-06-23 HISTORY — DX: Blindness, one eye, unspecified eye: H54.40

## 2024-06-23 HISTORY — DX: Headache, unspecified: R51.9

## 2024-06-23 NOTE — Progress Notes (Signed)
 UA ordered for preop. Pt. States she seldom makes urine.

## 2024-06-24 ENCOUNTER — Encounter (HOSPITAL_COMMUNITY)
Admission: RE | Admit: 2024-06-24 | Discharge: 2024-06-24 | Disposition: A | Discharge status: Home / Self Care | Source: Ambulatory Visit | Attending: Orthopedic Surgery | Admitting: Orthopedic Surgery

## 2024-06-24 DIAGNOSIS — Z01812 Encounter for preprocedural laboratory examination: Secondary | ICD-10-CM | POA: Diagnosis not present

## 2024-06-24 DIAGNOSIS — G8929 Other chronic pain: Secondary | ICD-10-CM | POA: Insufficient documentation

## 2024-06-24 DIAGNOSIS — M25562 Pain in left knee: Secondary | ICD-10-CM | POA: Insufficient documentation

## 2024-06-24 DIAGNOSIS — Z01818 Encounter for other preprocedural examination: Secondary | ICD-10-CM

## 2024-06-24 LAB — CBC WITH DIFFERENTIAL/PLATELET
Abs Immature Granulocytes: 0.02 K/uL (ref 0.00–0.07)
Basophils Absolute: 0 K/uL (ref 0.0–0.1)
Basophils Relative: 1 %
Eosinophils Absolute: 0.2 K/uL (ref 0.0–0.5)
Eosinophils Relative: 4 %
HCT: 37.3 % (ref 36.0–46.0)
Hemoglobin: 11.4 g/dL — ABNORMAL LOW (ref 12.0–15.0)
Immature Granulocytes: 0 %
Lymphocytes Relative: 24 %
Lymphs Abs: 1.4 K/uL (ref 0.7–4.0)
MCH: 28.7 pg (ref 26.0–34.0)
MCHC: 30.6 g/dL (ref 30.0–36.0)
MCV: 94 fL (ref 80.0–100.0)
Monocytes Absolute: 0.3 K/uL (ref 0.1–1.0)
Monocytes Relative: 6 %
Neutro Abs: 3.7 K/uL (ref 1.7–7.7)
Neutrophils Relative %: 65 %
Platelets: 247 K/uL (ref 150–400)
RBC: 3.97 MIL/uL (ref 3.87–5.11)
RDW: 17.1 % — ABNORMAL HIGH (ref 11.5–15.5)
WBC: 5.7 K/uL (ref 4.0–10.5)
nRBC: 0 % (ref 0.0–0.2)

## 2024-06-24 LAB — SURGICAL PCR SCREEN
MRSA, PCR: POSITIVE — AB
Staphylococcus aureus: POSITIVE — AB

## 2024-06-24 NOTE — Progress Notes (Signed)
 Pt. Unable to produce urine at preop. Pt. Is on Dialysis.

## 2024-06-26 ENCOUNTER — Encounter (HOSPITAL_COMMUNITY): Payer: Self-pay | Admitting: Physician Assistant

## 2024-06-26 NOTE — Progress Notes (Incomplete)
 Anesthesia Chart Review   Case: [id number] Date/Time: 07/06/24 1000   Procedure: ARTHROPLASTY, KNEE, UNICOMPARTMENTAL (Left: Knee)   Anesthesia type: Spinal   Diagnosis: Primary osteoarthritis of left knee [M17.12]   Pre-op  diagnosis: Primary osteoarthritis of left knee M17.12   Location: [facility] ROOM 06 / [facility]   Surgeons: Rubie Kemps, MD       DISCUSSION:48 y.o. never smoker with h/o HTN, ESRD on dialysis MWF, anal cancer, primary OA of left knee scheduled for above procedure 07/06/2024 with Dr. Kemps Rubie.   Left heart catheterization November 29, 2022: Normal right dominant coronary circulation with no high-grade obstructive disease.   Nuclear stress test May 15, 2022: Normal.  Low risk study  Echo 10/10/2023 EF 60-65%, mild mitral valve regurgitation. Per results comments, Overall this is a slightly improved result.  Since this is good, she can proceed with surgery at an acceptable risk.  Will forward to surgeon  Pt follows with nephrology, last seen 02/26/2024. 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  Now on hemodialysis. Per most recent nephrology note pt stable.  VS: Ht 4' 11 (1.499 m)   Wt 72.1 kg   LMP 09/13/2013   BMI 32.11 kg/m   PROVIDERS: [name], MD is PCP    LABS: {CHL AN LABS REVIEWED:112001::Labs reviewed: Acceptable for surgery.} (all labs ordered are listed, but only abnormal results are displayed)  Labs Reviewed - No data to display   IMAGES:   EKG:   CV: Echo 10/10/2023 1. Left ventricular ejection fraction, by estimation, is 60 to 65%. The  left ventricle has normal function. The left ventricle has no regional  wall motion abnormalities. There is moderate left ventricular hypertrophy.  Left ventricular diastolic  parameters were normal. Elevated left ventricular end-diastolic pressure.  The average left ventricular global longitudinal strain is 17.6 %.   2. Right ventricular systolic function is  normal. The right ventricular  size is normal. There is mildly elevated pulmonary artery systolic  pressure.   3. The mitral valve is degenerative. Mild mitral valve regurgitation. No  evidence of mitral stenosis.   4. The aortic valve is tricuspid. Aortic valve regurgitation is not  visualized. No aortic stenosis is present.   5. Aortic Normal DTA.   6. The inferior vena cava is normal in size with greater than 50%  respiratory variability, suggesting right atrial pressure of 3 mmHg.   Cardiac Cath 11/29/2022 1.  Normal right dominant coronary circulation with no high-grade obstructive disease. 2.  No coronary microvascular dysfunction based on CFR of 2.3 with normal being greater than 2 and index of mitral resistance of 12 with normal being less than 25. 3.  An assessment of coronary vasospasm cannot be performed today due to the nationwide shortage of IC acetylcholine.  No catheter induced spasm was noted. 4.  LVEDP of 14 mmHg.   Recommendation: Medical therapy  Myocardial Perfusion 05/15/2022     The study is normal. The study is low risk.   Left ventricular function is normal. Nuclear stress EF: 64 %. The left ventricular ejection fraction is normal (55-65%). End diastolic cavity size is normal.   Increased uptake of isotope seen in the mid right lung field.  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 R eye   Bloating 04/16/2018   Breakdown  (mechanical) of unspecified cardiac and vascular devices and implants, subsequent encounter 10/12/2022   Cancer (HCC)    pre cervical   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    of the [place]   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 09/05/2022   Carcinoma in situ of vulva 09/05/2022   pre cancerous   Chest pain of uncertain etiology 05/09/2022   Chronic kidney disease, stage V (HCC) 11/03/2021   Complications due to cardiac device, implant, and graft 09/02/2012   Condyloma of female genitalia    Dehydration 01/19/2019   Dependence on renal dialysis 08/21/2022   Depression    Dialysis patient    not currently as of 07/02/22   Drug-induced bleeding disorder 01/19/2019   DVT of axillary vein, acute left (HCC) 08/02/2012   Dysmenorrhea 10/19/2015   Dyspnea on exertion 01/19/2019   Dysuria    End stage renal disease (HCC) 09/02/2012   End stage renal disease on dialysis (HCC) 11/05/2022   ESRD (end stage renal disease) (HCC)    sp transplant x 2, did mix of HD and PD from 2001- 2008   Essential hypertension 11/03/2021   Essential hypertension with goal blood pressure less than 130/80    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   Fatigue    Gastritis, acute    GERD without esophagitis 12/20/2021   Gout    Gout due to renal impairment, unspecified site 08/21/2022   Headache    migraines   History of parathyroidectomy 10/29/2018   History of renal transplant    HTN (hypertension) 01/19/2019   Hypercholesterolemia    Hyperlipidemia    Hyperphosphatemia 12/20/2021   Hypertensive chronic kidney disease with stage 1 through stage 4 chronic kidney disease, or unspecified chronic kidney disease 08/21/2022   Hypokalemia    Hypomagnesemia    Hypothyroid 01/19/2019   IBS (irritable bowel syndrome)    Immunosuppressive management encounter following kidney transplant 10/29/2018   Internal hemorrhoid 04/22/2018   Iron deficiency anemia 10/19/2015   Irregular uterine  bleeding 02/13/2016   Kidney disorder 06/08/2020   Kidney transplant recipient 10/29/2018   1st transplant [facility]) was 1995- 2001.  Did HD/ PD mix from 2001- 2008. 2nd transplant ([facility]/ [facility]) was in 2008 and is still working as of 12/2021. F/b Dr Gearline ([place]) and [facility] transplant team.   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   Nausea and vomiting 01/19/2019   Nausea and vomiting 01/19/2019   Nephrogenic diabetes insipidus    congenital   Obesity, Class I, BMI 30-34.9 12/21/2021   Other chronic sinusitis 10/29/2018   Other fluid overload 10/12/2022   Pain, unspecified 08/28/2022   Palpitations    Pancreatic duct dilated 02/15/2022  Pancreatitis    Perianal lesion 04/22/2018   Phlebitis and thrombophlebitis of other sites 08/02/2012   Plantar fasciitis 07/03/2018   Pre-transplant evaluation for kidney transplant 01/09/2022   Preop cardiovascular exam 05/09/2022   Pruritus ani 06/26/2018   Renal failure, chronic 11/03/2021   Dialysis in the past   Right lower quadrant abdominal pain 10/29/2018   [facility] spotted fever    S/p cadaver renal transplant 08/02/2012   Secondary hyperparathyroidism of renal origin    Secondary hypertension due to renal disease 10/29/2018   Sesamoiditis 07/03/2018   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    Transplanted organ removal status 10/29/2018   Unstable angina (HCC) 01/19/2019   Urinary tract infection    Vision loss 06/15/2022   Vulvar dystrophy    Yeast dermatitis 08/14/2018    Past Surgical History:  Procedure  Laterality Date   A/V FISTULAGRAM Right 06/11/2024   Procedure: A/V Fistulagram;  Surgeon: Sheree Penne Bruckner, MD;  Location: [facility];  Service: Cardiovascular;  Laterality: Right;   AV FISTULA PLACEMENT     AV FISTULA PLACEMENT Right 05/17/2022   Procedure: RIGHT ARM BRACHIOCEPHALIC ARTERIOVENOUS (AV) FISTULA CREATION;  Surgeon: Lanis Fonda BRAVO, MD;  [facility];  Service: Vascular;  Laterality: Right;   COLONOSCOPY  11/28/2005   [facility]   COLONOSCOPY  04/13/2016   Dr Anette   CORONARY PRESSURE/FFR STUDY N/A 11/29/2022   Procedure: INTRAVASCULAR PRESSURE WIRE/FFR STUDY;  Surgeon: Wendel Lurena POUR, MD;  Location: [facility] INVASIVE CV LAB;  Service: Cardiovascular;  Laterality: N/A;   ESOPHAGOGASTRODUODENOSCOPY  07/31/2017   Distal esophageal ulcers (likely due to gastroeesphageal reflix-biopsied). Small hiatal hernia. Erosive gastritis.   FISTULA SUPERFICIALIZATION Right 07/17/2022   Procedure: SUPERFICIALIZATION AND REVISION RIGHT ARM FISTULA;  Surgeon: Lanis Fonda BRAVO, MD;  Location: [facility] OR;  Service: Vascular;  Laterality: Right;   HEMORRHOID SURGERY     2019, and 04/29/2020   INSERTION OF DIALYSIS CATHETER     KIDNEY TRANSPLANT  1995, 2008   KNEE SURGERY Left 2020   LEFT HEART CATH AND CORONARY ANGIOGRAPHY N/A 11/29/2022   Procedure: LEFT HEART CATH AND CORONARY ANGIOGRAPHY;  Surgeon: Wendel Lurena POUR, MD;  Location: [facility]VAS[facility] CV LAB;  Service: Cardiovascular;  Laterality: N/A;   PARATHYROIDECTOMY  2004   Portion on the parathyroid  gland transplanted in R forearm   REMOVAL OF A DIALYSIS CATHETER     SIMPLE VULVECTOMY  06/19/2011   Partial simple left   SKIN CANCER EXCISION  2016   Per pt, area removed on scalp showed squamous cell carcinoma   TUBAL LIGATION     UPPER GASTROINTESTINAL ENDOSCOPY     VENOUS ANGIOPLASTY  06/11/2024   Procedure: VENOUS ANGIOPLASTY;  Surgeon: Sheree Penne Bruckner, MD;  Location: [facility];  Service: Cardiovascular;;  Cephalic Arch;  Innominate Vein   VENOUS STENT Right 06/11/2024   Procedure: VENOUS STENT;  Surgeon: Sheree Penne Bruckner, MD;  Location: [facility];  Service: Cardiovascular;  Laterality: Right;  9x5 Viabahn   WISDOM TOOTH EXTRACTION      MEDICATIONS:  acetaminophen  (TYLENOL ) 500 MG tablet   allopurinol  (ZYLOPRIM ) 100 MG tablet   ALPRAZolam  (XANAX ) 1 MG tablet   aspirin  EC 81 MG tablet   carvedilol  (COREG ) 6.25 MG tablet   COZAAR 50 MG tablet   famotidine  (PEPCID ) 20 MG tablet   furosemide (LASIX) 40 MG tablet   gabapentin  (NEURONTIN ) 300 MG capsule   hydrALAZINE  (APRESOLINE ) 25 MG tablet  magnesium  oxide (MAG-OX) 400 (240 Mg) MG tablet   meclizine  (ANTIVERT ) 25 MG tablet   medroxyPROGESTERone  (PROVERA ) 10 MG tablet   nitroGLYCERIN  (NITROSTAT ) 0.4 MG SL tablet   pantoprazole  (PROTONIX ) 40 MG tablet   promethazine  (PHENERGAN ) 12.5 MG tablet   sertraline  (ZOLOFT ) 50 MG tablet   sevelamer  carbonate (RENVELA ) 800 MG tablet   simvastatin  (ZOCOR ) 5 MG tablet   traZODone  (DESYREL ) 100 MG tablet   No current facility-administered medications for this encounter.

## 2024-07-06 ENCOUNTER — Encounter (HOSPITAL_COMMUNITY): Admission: RE | Payer: Self-pay | Source: Home / Self Care

## 2024-07-06 ENCOUNTER — Ambulatory Visit (HOSPITAL_COMMUNITY): Admission: RE | Admit: 2024-07-06 | Source: Home / Self Care | Admitting: Orthopedic Surgery

## 2024-07-06 DIAGNOSIS — G8929 Other chronic pain: Secondary | ICD-10-CM

## 2024-07-06 SURGERY — ARTHROPLASTY, KNEE, UNICOMPARTMENTAL
Anesthesia: Spinal | Site: Knee | Laterality: Left

## 2024-08-07 NOTE — Pre-Procedure Instructions (Signed)
 Surgical Instructions   Your procedure is scheduled on August 12, 2024. Report to [facility] Main Entrance A at 6:30 A.M., then check in with the Admitting office. Any questions or running late day of surgery: call [phone]  Questions prior to your surgery date: call [phone], Monday-Friday, 8am-4pm. If you experience any cold or flu symptoms such as cough, fever, chills, shortness of breath, etc. between now and your scheduled surgery, please notify us  at the above number.     Remember:  Do not eat after midnight the night before your surgery  You may drink clear liquids until 5:30 AM the morning of your surgery.   Clear liquids allowed are: Water , Non-Citrus Juices (without pulp), Carbonated Beverages, Clear Tea (no milk, honey, etc.), Black Coffee Only (NO MILK, CREAM OR POWDERED CREAMER of any kind), and Gatorade.    Take these medicines the morning of surgery with A SIP OF WATER : allopurinol  (ZYLOPRIM )  carvedilol  (COREG )  famotidine  (PEPCID )  hydrALAZINE  (APRESOLINE )  medroxyPROGESTERone  (PROVERA )  pantoprazole  (PROTONIX )  sertraline  (ZOLOFT )    May take these medicines IF NEEDED: acetaminophen  (TYLENOL )  meclizine  (ANTIVERT )  nitroGLYCERIN  (NITROSTAT ) - if dose taken prior to surgery, please call [phone] ondansetron  (ZOFRAN -ODT)    Follow your surgeon's instructions on when to stop Aspirin .  If no instructions were given by your surgeon then you will need to call the office to get those instructions.     One week prior to surgery, STOP taking any Aleve, Naproxen, Ibuprofen, Motrin, Advil, Goody's, BC's, all herbal medications, fish oil, and non-prescription vitamins.                     Do NOT Smoke (Tobacco/Vaping) for 24 hours prior to your procedure.  If you use a CPAP at night, you may bring your mask/headgear for your overnight stay.   You will be asked to remove any contacts, glasses, piercing's, hearing aid's, dentures/partials prior to  surgery. Please bring cases for these items if needed.    Patients discharged the day of surgery will not be allowed to drive home, and someone needs to stay with them for 24 hours.  SURGICAL WAITING ROOM VISITATION Patients may have no more than 2 support people in the waiting area - these visitors may rotate.   Pre-op  nurse will coordinate an appropriate time for 1 ADULT support person, who may not rotate, to accompany patient in pre-op .  Children under the age of 73 must have an adult with them who is not the patient and must remain in the main waiting area with an adult.  If the patient needs to stay at the hospital during part of their recovery, the visitor guidelines for inpatient rooms apply.  Please refer to the [facility] website for the visitor guidelines for any additional information.   If you received a COVID test during your pre-op  visit  it is requested that you wear a mask when out in public, stay away from anyone that may not be feeling well and notify your surgeon if you develop symptoms. If you have been in contact with anyone that has tested positive in the last 10 days please notify you surgeon.      Pre-operative 4 CHG Bathing Instructions   You can play a key role in reducing the risk of infection after surgery. Your skin needs to be as free of germs as possible. You can reduce the number of germs on your skin by washing with CHG (chlorhexidine  gluconate) soap before  surgery. CHG is an antiseptic soap that kills germs and continues to kill germs even after washing.   DO NOT use if you have an allergy to chlorhexidine /CHG or antibacterial soaps. If your skin becomes reddened or irritated, stop using the CHG and notify one of our RNs at [phone].   Please shower with the CHG soap starting 4 days before surgery using the following schedule:     Please keep in mind the following:  DO NOT shave, including legs and underarms, starting the day of your first shower.    You may shave your face at any point before/day of surgery.  Place clean sheets on your bed the day you start using CHG soap. Use a clean washcloth (not used since being washed) for each shower. DO NOT sleep with pets once you start using the CHG.   CHG Shower Instructions:  Wash your face and private area with normal soap. If you choose to wash your hair, wash first with your normal shampoo.  After you use shampoo/soap, rinse your hair and body thoroughly to remove shampoo/soap residue.  Turn the water  OFF and apply  bottle of CHG soap to a CLEAN washcloth.  Apply CHG soap ONLY FROM YOUR NECK DOWN TO YOUR TOES (washing for 3-5 minutes)  DO NOT use CHG soap on face, private areas, open wounds, or sores.  Pay special attention to the area where your surgery is being performed.  If you are having back surgery, having someone wash your back for you may be helpful. Wait 2 minutes after CHG soap is applied, then you may rinse off the CHG soap.  Pat dry with a clean towel  Put on clean clothes/pajamas   If you choose to wear lotion, please use ONLY the CHG-compatible lotions that are listed below.  Additional instructions for the day of surgery:  If you choose, you may shower the morning of surgery with an antibacterial soap.  DO NOT APPLY any lotions, deodorants, cologne, or perfumes.   Do not bring valuables to the hospital. [facility] is not responsible for any belongings/valuables. Do not wear nail polish, gel polish, artificial nails, or any other type of covering on natural nails (fingers and toes) Do not wear jewelry or makeup Put on clean/comfortable clothes.  Please brush your teeth.  Ask your nurse before applying any prescription medications to the skin.     CHG Compatible Lotions   Aveeno Moisturizing lotion  Cetaphil Moisturizing Cream  Cetaphil Moisturizing Lotion  Clairol Herbal Essence Moisturizing Lotion, Dry Skin  Clairol Herbal Essence Moisturizing Lotion, Extra  Dry Skin  Clairol Herbal Essence Moisturizing Lotion, Normal Skin  Curel Age Defying Therapeutic Moisturizing Lotion with Alpha Hydroxy  Curel Extreme Care Body Lotion  Curel Soothing Hands Moisturizing Hand Lotion  Curel Therapeutic Moisturizing Cream, Fragrance-Free  Curel Therapeutic Moisturizing Lotion, Fragrance-Free  Curel Therapeutic Moisturizing Lotion, Original Formula  Eucerin Daily Replenishing Lotion  Eucerin Dry Skin Therapy Plus Alpha Hydroxy Crme  Eucerin Dry Skin Therapy Plus Alpha Hydroxy Lotion  Eucerin Original Crme  Eucerin Original Lotion  Eucerin Plus Crme Eucerin Plus Lotion  Eucerin TriLipid Replenishing Lotion  Keri Anti-Bacterial Hand Lotion  Keri Deep Conditioning Original Lotion Dry Skin Formula Softly Scented  Keri Deep Conditioning Original Lotion, Fragrance Free Sensitive Skin Formula  Keri Lotion Fast Absorbing Fragrance Free Sensitive Skin Formula  Keri Lotion Fast Absorbing Softly Scented Dry Skin Formula  Keri Original Lotion  Keri Skin Renewal Lotion Keri Silky Smooth Lotion  Many  Silky Smooth Sensitive Skin Lotion  Nivea Body Creamy Conditioning Oil  Nivea Body Extra Enriched Lotion  Nivea Body Original Lotion  Nivea Body Sheer Moisturizing Lotion Nivea Crme  Nivea Skin Firming Lotion  NutraDerm 30 Skin Lotion  NutraDerm Skin Lotion  NutraDerm Therapeutic Skin Cream  NutraDerm Therapeutic Skin Lotion  ProShield Protective Hand Cream  Provon moisturizing lotion  Please read over the following fact sheets that you were given.

## 2024-08-10 ENCOUNTER — Other Ambulatory Visit: Payer: Self-pay

## 2024-08-10 ENCOUNTER — Encounter (HOSPITAL_COMMUNITY)
Admission: RE | Admit: 2024-08-10 | Discharge: 2024-08-10 | Disposition: A | Discharge status: Home / Self Care | Source: Ambulatory Visit | Attending: Orthopedic Surgery | Admitting: Orthopedic Surgery

## 2024-08-10 ENCOUNTER — Encounter (HOSPITAL_COMMUNITY): Payer: Self-pay

## 2024-08-10 VITALS — BP 118/75 | HR 81 | Temp 97.8°F | Resp 16 | Ht 59.0 in | Wt 170.6 lb

## 2024-08-10 DIAGNOSIS — Z01818 Encounter for other preprocedural examination: Secondary | ICD-10-CM

## 2024-08-10 HISTORY — DX: Unspecified macular degeneration: H35.30

## 2024-08-10 LAB — SURGICAL PCR SCREEN
MRSA, PCR: POSITIVE — AB
Staphylococcus aureus: POSITIVE — AB

## 2024-08-10 NOTE — Progress Notes (Signed)
 MRSA returned positive from pre-op  PAT appointment.  Dr. Marcey Raman made aware via [employer].

## 2024-08-10 NOTE — Progress Notes (Addendum)
 PCP - [name], MD Cardiologist - Revankar, Jennifer SAUNDERS, MD Nephrologist- [facility]   PPM/ICD - denies   Chest x-ray - N/A EKG - 09/19/23 Stress Test - 05/15/22 ECHO - 10/10/23 Cardiac Cath - 11/29/22  Sleep Study - denies   Fasting Blood Sugar - N/A  Last dose of GLP1 agonist-  N/A   Blood Thinner Instructions: N/A Aspirin  Instructions: Pt reports that she has not taken ASA in months  ERAS Protcol - ERAS per protocol   COVID TEST- N/A   Anesthesia review: yes- medical history, review cardiac tests. Pt goes to HD on MWF. Pt has not had HD since Friday. She missed HD today d/t her abdomen having pain. Pt states she will go to HD on 08/11/24 in the AM. Pt will need Istat DOS.   Patient denies shortness of breath, fever, cough and chest pain at PAT appointment   All instructions explained to the patient, with a verbal understanding of the material. Patient agrees to go over the instructions while at home for a better understanding.  The opportunity to ask questions was provided.

## 2024-08-11 NOTE — Progress Notes (Signed)
 Anesthesia Chart Review:  48 year old female with pertinent history including ESRD on HD MWF via RUE AVF s/p failed renal transplant x2, NAION, hypothyroidism, anxiety/depression, GERD/hiatal hernia on PPI, HTN, DVT, obesity BMI 34, s/p C5-7 ACDF.  Echo 10/10/2023 showed LVEF 60 to 55%, normal wall motion, moderate LVH, normal RV, mild MR.  Left heart cath 11/29/2022 showed normal coronaries.  Event monitor 10/2022 with average heart rate 81 bpm, predominant rhythm was sinus, 29 episodes of SVT.  Patient reports she did not go to scheduled dialysis session on Monday 12/8 due to abdominal pain.  She reports she will be going on Tuesday 12/9.  Preop CBC reviewed, mild anemia hemoglobin 11.4, otherwise unremarkable.  She will need i-STAT on day of surgery.  EKG 11/08/2023: NSR.  Rate 79.  Prolonged QT (QTc 488)  TTE 10/10/2023: 1. Left ventricular ejection fraction, by estimation, is 60 to 65%. The  left ventricle has normal function. The left ventricle has no regional  wall motion abnormalities. There is moderate left ventricular hypertrophy.  Left ventricular diastolic  parameters were normal. Elevated left ventricular end-diastolic pressure.  The average left ventricular global longitudinal strain is 17.6 %.   2. Right ventricular systolic function is normal. The right ventricular  size is normal. There is mildly elevated pulmonary artery systolic  pressure.   3. The mitral valve is degenerative. Mild mitral valve regurgitation. No  evidence of mitral stenosis.   4. The aortic valve is tricuspid. Aortic valve regurgitation is not  visualized. No aortic stenosis is present.   5. Aortic Normal DTA.   6. The inferior vena cava is normal in size with greater than 50%  respiratory variability, suggesting right atrial pressure of 3 mmHg.  Cath 11/29/2022: 1.  Normal right dominant coronary circulation with no high-grade obstructive disease. 2.  No coronary microvascular dysfunction based on CFR of  2.3 with normal being greater than 2 and index of mitral resistance of 12 with normal being less than 25. 3.  An assessment of coronary vasospasm cannot be performed today due to the nationwide shortage of IC acetylcholine.  No catheter induced spasm was noted. 4.  LVEDP of 14 mmHg.   Recommendation: Medical therapy      Lynwood Geofm RIGGERS [facility] Short Stay Center/Anesthesiology Phone [phone] 08/11/2024 11:13 AM

## 2024-08-11 NOTE — Anesthesia Preprocedure Evaluation (Signed)
 Anesthesia Evaluation    Airway        Dental   Pulmonary           Cardiovascular hypertension,      Neuro/Psych    GI/Hepatic   Endo/Other    Renal/GU      Musculoskeletal   Abdominal   Peds  Hematology   Anesthesia Other Findings   Reproductive/Obstetrics                              Anesthesia Physical Anesthesia Plan  ASA:   Anesthesia Plan:    Post-op Pain Management:    Induction:   PONV Risk Score and Plan:   Airway Management Planned:   Additional Equipment:   Intra-op Plan:   Post-operative Plan:   Informed Consent:   Plan Discussed with:   Anesthesia Plan Comments: (PAT note by Lynwood Hope, PA-C: 48 year old female with pertinent history including ESRD on HD MWF via RUE AVF s/p failed renal transplant x2, NAION, hypothyroidism, anxiety/depression, GERD/hiatal hernia on PPI, HTN, DVT, obesity BMI 34, s/p C5-7 ACDF.  Echo 10/10/2023 showed LVEF 60 to 55%, normal wall motion, moderate LVH, normal RV, mild MR.  Left heart cath 11/29/2022 showed normal coronaries.  Event monitor 10/2022 with average heart rate 81 bpm, predominant rhythm was sinus, 29 episodes of SVT.  Patient reports she did not go to scheduled dialysis session on Monday 12/8 due to abdominal pain.  She reports she will be going on Tuesday 12/9.  Preop CBC reviewed, mild anemia hemoglobin 11.4, otherwise unremarkable.  She will need i-STAT on day of surgery.  EKG 11/08/2023: NSR.  Rate 79.  Prolonged QT (QTc 488)  TTE 10/10/2023: 1. Left ventricular ejection fraction, by estimation, is 60 to 65%. The  left ventricle has normal function. The left ventricle has no regional  wall motion abnormalities. There is moderate left ventricular hypertrophy.  Left ventricular diastolic  parameters were normal. Elevated left ventricular end-diastolic pressure.  The average left ventricular global longitudinal strain is  17.6 %.   2. Right ventricular systolic function is normal. The right ventricular  size is normal. There is mildly elevated pulmonary artery systolic  pressure.   3. The mitral valve is degenerative. Mild mitral valve regurgitation. No  evidence of mitral stenosis.   4. The aortic valve is tricuspid. Aortic valve regurgitation is not  visualized. No aortic stenosis is present.   5. Aortic Normal DTA.   6. The inferior vena cava is normal in size with greater than 50%  respiratory variability, suggesting right atrial pressure of 3 mmHg.  Cath 11/29/2022: 1.  Normal right dominant coronary circulation with no high-grade obstructive disease. 2.  No coronary microvascular dysfunction based on CFR of 2.3 with normal being greater than 2 and index of mitral resistance of 12 with normal being less than 25. 3.  An assessment of coronary vasospasm cannot be performed today due to the nationwide shortage of IC acetylcholine.  No catheter induced spasm was noted. 4.  LVEDP of 14 mmHg.   Recommendation: Medical therapy  )         Anesthesia Quick Evaluation

## 2024-08-12 ENCOUNTER — Other Ambulatory Visit: Payer: Self-pay

## 2024-08-12 ENCOUNTER — Ambulatory Visit (HOSPITAL_COMMUNITY)
Admission: RE | Admit: 2024-08-12 | Discharge: 2024-08-12 | Disposition: A | Discharge status: Home / Self Care | Attending: Orthopedic Surgery | Admitting: Orthopedic Surgery

## 2024-08-12 ENCOUNTER — Encounter (HOSPITAL_COMMUNITY): Payer: Self-pay | Admitting: Orthopedic Surgery

## 2024-08-12 ENCOUNTER — Encounter (HOSPITAL_COMMUNITY): Admission: RE | Disposition: A | Payer: Self-pay | Source: Home / Self Care | Attending: Orthopedic Surgery

## 2024-08-12 ENCOUNTER — Ambulatory Visit (HOSPITAL_COMMUNITY)

## 2024-08-12 ENCOUNTER — Ambulatory Visit (HOSPITAL_COMMUNITY): Payer: Self-pay | Admitting: Physician Assistant

## 2024-08-12 DIAGNOSIS — K219 Gastro-esophageal reflux disease without esophagitis: Secondary | ICD-10-CM | POA: Diagnosis not present

## 2024-08-12 DIAGNOSIS — M1712 Unilateral primary osteoarthritis, left knee: Secondary | ICD-10-CM | POA: Diagnosis present

## 2024-08-12 DIAGNOSIS — E039 Hypothyroidism, unspecified: Secondary | ICD-10-CM | POA: Insufficient documentation

## 2024-08-12 DIAGNOSIS — N186 End stage renal disease: Secondary | ICD-10-CM | POA: Insufficient documentation

## 2024-08-12 DIAGNOSIS — I120 Hypertensive chronic kidney disease with stage 5 chronic kidney disease or end stage renal disease: Secondary | ICD-10-CM | POA: Insufficient documentation

## 2024-08-12 HISTORY — PX: PARTIAL KNEE ARTHROPLASTY: SHX2174

## 2024-08-12 LAB — POCT I-STAT, CHEM 8
BUN: 26 mg/dL — ABNORMAL HIGH (ref 6–20)
Calcium, Ion: 1.09 mmol/L — ABNORMAL LOW (ref 1.15–1.40)
Chloride: 98 mmol/L (ref 98–111)
Creatinine, Ser: 7.3 mg/dL — ABNORMAL HIGH (ref 0.44–1.00)
Glucose, Bld: 89 mg/dL (ref 70–99)
HCT: 33 % — ABNORMAL LOW (ref 36.0–46.0)
Hemoglobin: 11.2 g/dL — ABNORMAL LOW (ref 12.0–15.0)
Potassium: 5 mmol/L (ref 3.5–5.1)
Sodium: 135 mmol/L (ref 135–145)
TCO2: 26 mmol/L (ref 22–32)

## 2024-08-12 SURGERY — ARTHROPLASTY, KNEE, UNICOMPARTMENTAL
Anesthesia: Spinal | Site: Knee | Laterality: Left

## 2024-08-12 MED ORDER — BUPIVACAINE-EPINEPHRINE (PF) 0.5% -1:200000 IJ SOLN
INTRAMUSCULAR | Status: AC
  Filled 2024-08-12: qty 30

## 2024-08-12 MED ORDER — ASPIRIN 81 MG PO TBEC: 81.0000 mg | DELAYED_RELEASE_TABLET | Freq: Two times a day (BID) | ORAL | Status: AC

## 2024-08-12 MED ORDER — OXYCODONE HCL 5 MG PO TABS
ORAL_TABLET | ORAL | Status: AC
  Filled 2024-08-12: qty 1

## 2024-08-12 MED ORDER — ORAL CARE MOUTH RINSE: 15.0000 mL | Freq: Once | OROMUCOSAL | Status: AC

## 2024-08-12 MED ORDER — ONDANSETRON HCL 4 MG/2ML IJ SOLN
INTRAMUSCULAR | Status: AC
  Filled 2024-08-12: qty 2

## 2024-08-12 MED ORDER — ONDANSETRON HCL 4 MG/2ML IJ SOLN: 4.0000 mg | Freq: Four times a day (QID) | INTRAMUSCULAR | Status: DC | PRN

## 2024-08-12 MED ORDER — FENTANYL CITRATE (PF) 100 MCG/2ML IJ SOLN
INTRAMUSCULAR | Status: DC | PRN
  Administered 2024-08-12 (×2): 50 ug via INTRAVENOUS

## 2024-08-12 MED ORDER — CHLORHEXIDINE GLUCONATE 0.12 % MT SOLN
15.0000 mL | Freq: Once | OROMUCOSAL | Status: AC
  Administered 2024-08-12: 15 mL via OROMUCOSAL
  Filled 2024-08-12: qty 15

## 2024-08-12 MED ORDER — BUPIVACAINE LIPOSOME 1.3 % IJ SUSP
INTRAMUSCULAR | Status: AC
  Filled 2024-08-12: qty 20

## 2024-08-12 MED ORDER — FENTANYL CITRATE (PF) 100 MCG/2ML IJ SOLN: 25.0000 ug | INTRAMUSCULAR | Status: DC | PRN

## 2024-08-12 MED ORDER — ONDANSETRON HCL 4 MG/2ML IJ SOLN
INTRAMUSCULAR | Status: DC | PRN
  Administered 2024-08-12: 4 mg via INTRAVENOUS

## 2024-08-12 MED ORDER — PROPOFOL 10 MG/ML IV BOLUS
INTRAVENOUS | Status: DC | PRN
  Administered 2024-08-12: 75 ug/kg/min via INTRAVENOUS
  Administered 2024-08-12: 50 mg via INTRAVENOUS

## 2024-08-12 MED ORDER — ROPIVACAINE HCL 5 MG/ML IJ SOLN
INTRAMUSCULAR | Status: DC | PRN
  Administered 2024-08-12: 25 mL via PERINEURAL

## 2024-08-12 MED ORDER — SODIUM CHLORIDE 0.9 % IV SOLN: INTRAVENOUS | Status: DC

## 2024-08-12 MED ORDER — OXYCODONE HCL 5 MG PO TABS
5.0000 mg | ORAL_TABLET | Freq: Once | ORAL | Status: AC | PRN
  Administered 2024-08-12: 5 mg via ORAL

## 2024-08-12 MED ORDER — LIDOCAINE 2% (20 MG/ML) 5 ML SYRINGE
INTRAMUSCULAR | Status: DC | PRN
  Administered 2024-08-12: 60 mg via INTRAVENOUS

## 2024-08-12 MED ORDER — BUPIVACAINE LIPOSOME 1.3 % IJ SUSP
INTRAMUSCULAR | Status: DC | PRN
  Administered 2024-08-12: 20 mL

## 2024-08-12 MED ORDER — SODIUM CHLORIDE (PF) 0.9 % IJ SOLN
INTRAMUSCULAR | Status: DC | PRN
  Administered 2024-08-12: 20 mL

## 2024-08-12 MED ORDER — FENTANYL CITRATE (PF) 100 MCG/2ML IJ SOLN
INTRAMUSCULAR | Status: AC
  Filled 2024-08-12: qty 2

## 2024-08-12 MED ORDER — VANCOMYCIN HCL IN DEXTROSE 1-5 GM/200ML-% IV SOLN
1000.0000 mg | INTRAVENOUS | Status: AC
  Administered 2024-08-12: 1000 mg via INTRAVENOUS
  Filled 2024-08-12: qty 200

## 2024-08-12 MED ORDER — OXYCODONE HCL 5 MG/5ML PO SOLN: 5.0000 mg | Freq: Once | ORAL | Status: AC | PRN

## 2024-08-12 MED ORDER — LIDOCAINE 2% (20 MG/ML) 5 ML SYRINGE
INTRAMUSCULAR | Status: AC
  Filled 2024-08-12: qty 5

## 2024-08-12 MED ORDER — MIDAZOLAM HCL 2 MG/2ML IJ SOLN
INTRAMUSCULAR | Status: AC
  Filled 2024-08-12: qty 2

## 2024-08-12 MED ORDER — MEPIVACAINE HCL (PF) 2 % IJ SOLN
INTRAMUSCULAR | Status: DC | PRN
  Administered 2024-08-12: 3 mL via INTRATHECAL

## 2024-08-12 MED ORDER — BUPIVACAINE-EPINEPHRINE (PF) 0.25% -1:200000 IJ SOLN
INTRAMUSCULAR | Status: DC | PRN
  Administered 2024-08-12: 20 mL

## 2024-08-12 MED ORDER — BUPIVACAINE-EPINEPHRINE (PF) 0.25% -1:200000 IJ SOLN
INTRAMUSCULAR | Status: AC
  Filled 2024-08-12: qty 30

## 2024-08-12 MED ORDER — DEXAMETHASONE SOD PHOSPHATE PF 10 MG/ML IJ SOLN
INTRAMUSCULAR | Status: DC | PRN
  Administered 2024-08-12: 8 mg via INTRAVENOUS

## 2024-08-12 MED ORDER — SODIUM CHLORIDE 0.9 % IR SOLN
Status: DC | PRN
  Administered 2024-08-12: 1000 mL

## 2024-08-12 SURGICAL SUPPLY — 54 items
BAG COUNTER SPONGE SURGICOUNT (BAG) ×1 IMPLANT
BEARING TIBIAL IBALANCE SZ 1 9 (Joint) IMPLANT
BLADE SAGITTAL 13X1.27X60 (BLADE) ×1 IMPLANT
BLADE SAW RECIP 87.9 MT (BLADE) IMPLANT
BLADE SAW SGTL 83.5X18.5 (BLADE) ×1 IMPLANT
BLADE SURG 10 STRL SS (BLADE) ×1 IMPLANT
BNDG COMPR ESMARK 6X3 LF (GAUZE/BANDAGES/DRESSINGS) ×1 IMPLANT
BNDG ELASTIC 6INX 5YD STR LF (GAUZE/BANDAGES/DRESSINGS) ×1 IMPLANT
BOWL SMART MIX CTS (DISPOSABLE) ×1 IMPLANT
CEMENT BONE SIMPLEX SPEEDSET (Cement) IMPLANT
COVER SURGICAL LIGHT HANDLE (MISCELLANEOUS) ×1 IMPLANT
CUFF TRNQT CYL 34X4.125X (TOURNIQUET CUFF) ×1 IMPLANT
DRAPE EXTREMITY T 121X128X90 (DISPOSABLE) ×1 IMPLANT
DRAPE HALF SHEET 40X57 (DRAPES) ×1 IMPLANT
DRAPE INCISE IOBAN 66X45 STRL (DRAPES) ×2 IMPLANT
DRAPE U-SHAPE 47X51 STRL (DRAPES) ×1 IMPLANT
DRSG AQUACEL AG ADV 3.5X10 (GAUZE/BANDAGES/DRESSINGS) ×1 IMPLANT
DURAPREP 26ML APPLICATOR (WOUND CARE) ×2 IMPLANT
ELECTRODE REM PT RTRN 9FT ADLT (ELECTROSURGICAL) ×1 IMPLANT
FEMORAL IBALANCE LT UKA SZ1 (Knees) IMPLANT
GLOVE BIO SURGEON STRL SZ 6.5 (GLOVE) IMPLANT
GLOVE BIOGEL M 7.0 STRL (GLOVE) IMPLANT
GLOVE BIOGEL PI IND STRL 7.0 (GLOVE) IMPLANT
GLOVE BIOGEL PI IND STRL 7.5 (GLOVE) IMPLANT
GLOVE BIOGEL PI IND STRL 8.5 (GLOVE) ×1 IMPLANT
GLOVE PI ORTHO PRO STRL SZ8 (GLOVE) ×2 IMPLANT
GOWN STRL REUS W/ TWL LRG LVL3 (GOWN DISPOSABLE) ×1 IMPLANT
GOWN STRL REUS W/ TWL XL LVL3 (GOWN DISPOSABLE) ×2 IMPLANT
GOWN STRL REUS W/TWL 2XL LVL3 (GOWN DISPOSABLE) ×1 IMPLANT
GUIDE UKA TIBIAL DISPOSEABLE (DISPOSABLE) IMPLANT
HOOD PEEL AWAY T7 (MISCELLANEOUS) IMPLANT
HOOD W/PEELAWAY (MISCELLANEOUS) ×3 IMPLANT
KIT BASIN OR (CUSTOM PROCEDURE TRAY) ×1 IMPLANT
KIT TURNOVER KIT B (KITS) ×1 IMPLANT
MANIFOLD NEPTUNE II (INSTRUMENTS) ×1 IMPLANT
NDL 18GX1X1/2 (RX/OR ONLY) (NEEDLE) IMPLANT
NDL 22X1.5 STRL (OR ONLY) (MISCELLANEOUS) ×2 IMPLANT
PACK TOTAL JOINT (CUSTOM PROCEDURE TRAY) ×1 IMPLANT
PAD ARMBOARD POSITIONER FOAM (MISCELLANEOUS) ×2 IMPLANT
SET HNDPC FAN SPRY TIP SCT (DISPOSABLE) ×1 IMPLANT
SOLN 0.9% NACL POUR BTL 1000ML (IV SOLUTION) ×1 IMPLANT
STRIP CLOSURE SKIN 1/2X4 (GAUZE/BANDAGES/DRESSINGS) ×1 IMPLANT
SUCTION TUBE FRAZIER 10FR DISP (SUCTIONS) IMPLANT
SUT BONE WAX W31G (SUTURE) ×1 IMPLANT
SUT MNCRL AB 3-0 PS2 18 (SUTURE) ×1 IMPLANT
SUT VIC AB 0 CT1 27XBRD ANBCTR (SUTURE) ×1 IMPLANT
SUT VIC AB 1 CT1 27XBRD ANBCTR (SUTURE) ×2 IMPLANT
SUT VIC AB 2-0 CT1 TAPERPNT 27 (SUTURE) ×2 IMPLANT
SUTURE STRATFX 0 PDS 27 VIOLET (SUTURE) ×1 IMPLANT
SYR 20ML LL LF (SYRINGE) ×2 IMPLANT
TOWEL GREEN STERILE (TOWEL DISPOSABLE) ×1 IMPLANT
TOWEL GREEN STERILE FF (TOWEL DISPOSABLE) ×1 IMPLANT
TRAY TIB CMT UKA SZ 1 LM/RL (Joint) IMPLANT
WRAP KNEE MAXI GEL POST OP (GAUZE/BANDAGES/DRESSINGS) ×1 IMPLANT

## 2024-08-12 NOTE — Transfer of Care (Signed)
 Immediate Anesthesia Transfer of Care Note  Patient: Ariel [name]  Procedure(s) Performed: ARTHROPLASTY, KNEE, UNICOMPARTMENTAL (Left: Knee)  Patient Location: PACU  Anesthesia Type:MAC, Regional, and Spinal  Level of Consciousness: awake, alert , and sedated  Airway & Oxygen Therapy: Patient Spontanous Breathing and Patient connected to face mask oxygen  Post-op Assessment: Report given to RN and Post -op Vital signs reviewed and stable  Post vital signs: Reviewed and stable  Last Vitals:  Vitals Value Taken Time  BP 119/73 08/12/24 10:32  Temp    Pulse 81 08/12/24 10:34  Resp 15 08/12/24 10:34  SpO2 98 % 08/12/24 10:34  Vitals shown include unfiled device data.  Last Pain:  Vitals:   08/12/24 0736  TempSrc:   PainSc: 6       Patients Stated Pain Goal: 4 (08/12/24 0736)  Complications: No notable events documented.

## 2024-08-12 NOTE — Evaluation (Signed)
 Physical Therapy Evaluation Patient Details Name: Ariel [name] MRN: [id number] DOB: 07-Jun-1976 Today's Date: 08/12/2024  History of Present Illness  48 y.o. female presents to [facility] hospital on 08/12/2024 for elective L TKA. PMH includes ESRD, Anxiety, HTN, hypothyroidism, kidney transplant.  Clinical Impression  Pt presents to PT with deficits in functional mobility, gait, balance, strength, ROM. Pt is able to ambulate for household distances and negotiate 2 steps to simulate home entrance with support of the RW. PT provides education on the TKR exercise packet and encourages frequent mobilization with staff assistance. PT will follow up tomorrow for a progression of gait and stair training, however the pt is currently able to complete all mobility necessary within the home without physical assistance.      If plan is discharge home, recommend the following: A little help with bathing/dressing/bathroom;Assistance with cooking/housework;Assist for transportation;Help with stairs or ramp for entrance   Can travel by private vehicle        Equipment Recommendations None recommended by PT  Recommendations for Other Services       Functional Status Assessment Patient has had a recent decline in their functional status and demonstrates the ability to make significant improvements in function in a reasonable and predictable amount of time.     Precautions / Restrictions Precautions Precautions: Fall;Knee Precaution Booklet Issued: Yes (comment) Recall of Precautions/Restrictions: Intact Restrictions Weight Bearing Restrictions Per Provider Order: Yes LLE Weight Bearing Per Provider Order: Weight bearing as tolerated      Mobility  Bed Mobility Overal bed mobility: Needs Assistance Bed Mobility: Supine to Sit     Supine to sit: Supervision          Transfers Overall transfer level: Needs assistance Equipment used: Rolling walker (2 wheels) Transfers: Sit to/from  Stand Sit to Stand: Supervision                Ambulation/Gait Ambulation/Gait assistance: Contact guard assist Gait Distance (Feet): 100 Feet Assistive device: Rolling walker (2 wheels) Gait Pattern/deviations: Step-through pattern Gait velocity: reduced Gait velocity interpretation: <1.8 ft/sec, indicate of risk for recurrent falls   General Gait Details: slowed step-through gait, reduced stance time on LLE  Stairs Stairs: Yes Stairs assistance: Contact guard assist Stair Management: No rails, Step to pattern, Backwards, With walker Number of Stairs: 2    Wheelchair Mobility     Tilt Bed    Modified Rankin (Stroke Patients Only)       Balance Overall balance assessment: Needs assistance Sitting-balance support: No upper extremity supported, Feet supported Sitting balance-Leahy Scale: Good     Standing balance support: Single extremity supported, Reliant on assistive device for balance Standing balance-Leahy Scale: Poor                               Pertinent Vitals/Pain Pain Assessment Pain Assessment: 0-10 Pain Score: 8  Pain Location: L knee Pain Descriptors / Indicators: Sore Pain Intervention(s): Monitored during session    Home Living Family/patient expects to be discharged to:: Private residence Living Arrangements: Alone (going to stay with her parents at the time of discharge) Available Help at Discharge: Family;Available 24 hours/day Type of Home: House Home Access: Stairs to enter Entrance Stairs-Rails: None Entrance Stairs-Number of Steps: 2 Alternate Level Stairs-Number of Steps:  (pt plans to sleep downstairs initially, mom to assist with sponge baths downstairs) Home Layout: Two level;1/2 bath on main level Home Equipment: Rolling Walker (2 wheels);Wheelchair - manual;BSC/3in1  Prior Function Prior Level of Function : Independent/Modified Independent             Mobility Comments: ambulatory without DME        Extremity/Trunk Assessment   Upper Extremity Assessment Upper Extremity Assessment: Overall WFL for tasks assessed    Lower Extremity Assessment Lower Extremity Assessment: LLE deficits/detail LLE Deficits / Details: generalized post-op weakness and ROM deficits as anticipated on POD 0    Cervical / Trunk Assessment Cervical / Trunk Assessment: Normal  Communication   Communication Communication: No apparent difficulties    Cognition Arousal: Alert Behavior During Therapy: WFL for tasks assessed/performed   PT - Cognitive impairments: No apparent impairments                         Following commands: Intact       Cueing Cueing Techniques: Verbal cues     General Comments General comments (skin integrity, edema, etc.): VSS on RA    Exercises     Assessment/Plan    PT Assessment Patient needs continued PT services  PT Problem List Decreased strength;Decreased range of motion;Decreased activity tolerance;Decreased balance;Decreased mobility;Pain       PT Treatment Interventions DME instruction;Gait training;Stair training;Functional mobility training;Therapeutic activities;Therapeutic exercise;Balance training;Neuromuscular re-education;Patient/family education    PT Goals (Current goals can be found in the Care Plan section)  Acute Rehab PT Goals Patient Stated Goal: to return home PT Goal Formulation: With patient Time For Goal Achievement: 08/16/24 Potential to Achieve Goals: Good    Frequency 7X/week     Co-evaluation               AM-PAC PT 6 Clicks Mobility  Outcome Measure Help needed turning from your back to your side while in a flat bed without using bedrails?: A Little Help needed moving from lying on your back to sitting on the side of a flat bed without using bedrails?: A Little Help needed moving to and from a bed to a chair (including a wheelchair)?: A Little Help needed standing up from a chair using your arms (e.g.,  wheelchair or bedside chair)?: A Little Help needed to walk in hospital room?: A Little Help needed climbing 3-5 steps with a railing? : A Little 6 Click Score: 18    End of Session Equipment Utilized During Treatment: Gait belt Activity Tolerance: Patient tolerated treatment well Patient left: in chair;with call bell/phone within reach;with nursing/sitter in room Nurse Communication: Mobility status PT Visit Diagnosis: Other abnormalities of gait and mobility (R26.89);Muscle weakness (generalized) (M62.81)    Time: 8772-8741 PT Time Calculation (min) (ACUTE ONLY): 31 min   Charges:   PT Evaluation $PT Eval Low Complexity: 1 Low   PT General Charges $$ ACUTE PT VISIT: 1 Visit       Bernardino JINNY Ruth, PT, DPT Acute Rehabilitation Office [phone]   Bernardino JINNY Ruth 08/12/2024, 1:04 PM

## 2024-08-12 NOTE — Anesthesia Procedure Notes (Signed)
 Spinal  Patient location during procedure: OR Start time: 08/12/2024 8:32 AM End time: 08/12/2024 8:35 AM Reason for block: surgical anesthesia Staffing Performed: anesthesiologist  Anesthesiologist: Maryclare Cornet, MD Performed by: Maryclare Cornet, MD Authorized by: Maryclare Cornet, MD   Preanesthetic Checklist Completed: patient identified, IV checked, risks and benefits discussed, surgical consent, monitors and equipment checked, pre-op  evaluation and timeout performed Spinal Block Patient position: sitting Prep: DuraPrep Patient monitoring: cardiac monitor, continuous pulse ox and blood pressure Approach: midline Location: L3-4 Injection technique: single-shot Needle Needle type: Pencan  Needle gauge: 24 G Needle length: 9 cm Assessment Sensory level: T10 Events: CSF return Additional Notes Functioning IV was confirmed and monitors were applied. Sterile prep and drape, including hand hygiene and sterile gloves were used. The patient was positioned and the spine was prepped. The skin was anesthetized with lidocaine .  Free flow of clear CSF was obtained prior to injecting local anesthetic into the CSF.  The spinal needle aspirated freely following injection.  The needle was carefully withdrawn.  The patient tolerated the procedure well.

## 2024-08-12 NOTE — Anesthesia Procedure Notes (Signed)
 Anesthesia Regional Block: Adductor canal block   Pre-Anesthetic Checklist: , timeout performed,  Correct Patient, Correct Site, Correct Laterality,  Correct Procedure, Correct Position, site marked,  Risks and benefits discussed,  Surgical consent,  Pre-op  evaluation,  At surgeon's request and post-op pain management  Laterality: Left  Prep: chloraprep       Needles:  Injection technique: Single-shot  Needle Type: Echogenic Needle     Needle Length: 9cm  Needle Gauge: 21     Additional Needles:   Narrative:  Start time: 08/12/2024 8:08 AM End time: 08/12/2024 8:16 AM Injection made incrementally with aspirations every 5 mL.  Performed by: Personally  Anesthesiologist: Maryclare Cornet, MD  Additional Notes: Pt tolerated the procedure well.

## 2024-08-12 NOTE — H&P (Signed)
 Ariel [name] MRN:  [id number] DOB/SEX:  [birth year]/female  CHIEF COMPLAINT:  Painful left Knee  HISTORY: Patient is a 48 y.o. female presented with a history of pain in the left knee. Onset of symptoms was gradual starting several years ago with gradually worsening course since that time. Patient has been treated conservatively with over-the-counter NSAIDs and activity modification. Patient currently rates pain in the knee at 10 out of 10 with activity. There is pain at night.  PAST MEDICAL HISTORY: Patient Active Problem List   Diagnosis Date Noted   Headache 05/09/2023   Unspecified severe protein-calorie malnutrition 11/27/2022   Angina pectoris 11/21/2022   End stage renal disease on dialysis [facility]) 11/05/2022   Breakdown (mechanical) of unspecified cardiac and vascular devices and implants, subsequent encounter 10/12/2022   Other fluid overload 10/12/2022   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 09/05/2022   Carcinoma in situ of vulva 09/05/2022   Allergy, unspecified, initial encounter 08/28/2022   Anaphylactic shock, unspecified, initial encounter 08/28/2022   Pain, unspecified 08/28/2022   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) 08/22/2022   Gout due to renal impairment, unspecified site 08/21/2022   Long term (current) use of calcineurin inhibitor 08/21/2022   Long term (current) use of inhibitors of nucleotide synthesis 08/21/2022   Anemia in chronic kidney disease 08/21/2022   Hypertensive chronic kidney disease with stage 1 through stage 4 chronic kidney disease, or unspecified chronic kidney disease 08/21/2022   Dependence on renal dialysis 08/21/2022   NAION (non-arteritic anterior ischemic optic neuropathy), 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   Vision loss 06/15/2022   Chest pain of uncertain etiology 05/09/2022   Preop cardiovascular exam 05/09/2022   Hyperlipidemia 05/08/2022   Essential hypertension with goal blood pressure less  than 130/80 05/08/2022   Pancreatic duct dilated 02/15/2022   Abnormal CT scan, gastrointestinal tract    Abdominal pain 02/14/2022   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   Pre-transplant evaluation for kidney transplant 01/09/2022   Obesity, Class I, BMI 30-34.9 12/21/2021   GERD without esophagitis 12/20/2021   Hyperphosphatemia 12/20/2021   Anemia associated with chronic renal failure 11/03/2021   Back pain 11/03/2021   Cancer (HCC) 11/03/2021   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 11/03/2021   Chronic kidney disease, stage V (HCC) 11/03/2021   Condyloma of female genitalia 11/03/2021   Depression 11/03/2021   Dialysis patient 11/03/2021   Dysuria 11/03/2021   ESRD (end stage renal disease) (HCC) 11/03/2021   Essential hypertension 11/03/2021   Fatigue 11/03/2021   Gastritis, acute 11/03/2021   Gout 11/03/2021   History of renal transplant 11/03/2021   Hypercholesterolemia 11/03/2021   Hypokalemia 11/03/2021   Hypomagnesemia 11/03/2021   IBS (irritable bowel syndrome) 11/03/2021   Leg edema 11/03/2021   Metabolic acidosis 11/03/2021   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   Palpitations 11/03/2021   Pancreatitis 11/03/2021   Renal failure, chronic 11/03/2021   [place] spotted fever 11/03/2021   Secondary hyperparathyroidism of renal origin 11/03/2021   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 11/03/2021   Urinary tract infection 11/03/2021   Vulvar dystrophy 11/03/2021   AKI (acute kidney injury) 08/18/2021   Anorectal disorder 06/08/2020   Anorectal pain 06/08/2020   Kidney disorder 06/08/2020   AIN grade III 04/05/2020   Nausea and vomiting 01/19/2019   Anemia 01/19/2019   Drug-induced bleeding disorder 01/19/2019   Dyspnea on exertion 01/19/2019   HTN (hypertension) 01/19/2019   Hypothyroid  01/19/2019   Unstable angina (HCC) 01/19/2019   Anxiety disorder 10/29/2018   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   History  of parathyroidectomy 10/29/2018   Immunosuppressive management encounter following kidney transplant 10/29/2018   Other chronic sinusitis 10/29/2018   Right lower quadrant abdominal pain 10/29/2018   Secondary hypertension due to renal disease 10/29/2018   Transplanted organ removal status 10/29/2018   Kidney transplant recipient 10/29/2018   Yeast dermatitis 08/14/2018   Plantar fasciitis 07/03/2018   Sesamoiditis 07/03/2018   AIN grade II 06/26/2018   Pruritus ani 06/26/2018   Perianal lesion 04/22/2018   Bloating 04/16/2018   Irregular uterine bleeding 02/13/2016   Iron deficiency anemia 10/19/2015   Mechanical complication of other vascular device, implant, and graft 09/02/2012   End stage renal disease (HCC) 09/02/2012   Complications due to cardiac device, implant, and graft 09/02/2012   DVT of axillary vein, acute left (HCC) 08/02/2012   S/p cadaver renal transplant 08/02/2012   Phlebitis and thrombophlebitis of other sites 08/02/2012   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   AKI (acute kidney injury) 08/18/2021   Allergy, unspecified, initial encounter 08/28/2022   Anaphylactic shock, unspecified, initial encounter 08/28/2022   Anemia associated with chronic renal failure    Anemia in chronic kidney disease 08/21/2022   Anorectal disorder 06/08/2020   Anorectal pain 06/08/2020   Anxiety disorder 10/29/2018   Back pain    Blind right eye    Can not see straight in front of her. R eye   Bloating 04/16/2018   Breakdown (mechanical) of unspecified cardiac and vascular devices and implants, subsequent encounter 10/12/2022   Cancer (HCC)    pre cervical   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    of the [place]   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 09/05/2022   Carcinoma in situ of vulva 09/05/2022   pre cancerous   Chest pain of uncertain etiology  05/09/2022   Chronic kidney disease, stage V (HCC) 11/03/2021   Complications due to cardiac device, implant, and graft 09/02/2012   Condyloma of female genitalia    Dehydration 01/19/2019   Dependence on renal dialysis 08/21/2022   Depression    Dialysis patient    not currently as of 07/02/22   Drug-induced bleeding disorder 01/19/2019   DVT of axillary vein, acute left (HCC) 08/02/2012   Dysmenorrhea 10/19/2015   Dyspnea on exertion 01/19/2019   Dysuria    End stage renal disease (HCC) 09/02/2012   End stage renal disease on dialysis (HCC) 11/05/2022   ESRD (end stage renal disease) (HCC)    sp transplant x 2, did mix of HD and PD from 2001- 2008   Essential hypertension 11/03/2021   Essential hypertension with goal blood pressure less than 130/80    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   Fatigue    Gastritis, acute    GERD without esophagitis 12/20/2021   Gout    Gout due to renal impairment, unspecified site 08/21/2022   Headache    migraines   History of parathyroidectomy 10/29/2018   History of renal transplant    HTN (hypertension) 01/19/2019   Hypercholesterolemia    Hyperlipidemia    Hyperphosphatemia 12/20/2021   Hypertensive chronic kidney disease with stage 1 through stage 4 chronic kidney disease, or unspecified chronic kidney disease 08/21/2022   Hypokalemia    Hypomagnesemia  Hypothyroid 01/19/2019   IBS (irritable bowel syndrome)    Immunosuppressive management encounter following kidney transplant 10/29/2018   Internal hemorrhoid 04/22/2018   Iron deficiency anemia 10/19/2015   Irregular uterine bleeding 02/13/2016   Kidney disorder 06/08/2020   Kidney transplant recipient 10/29/2018   1st transplant [facility]) was 1995- 2001.  Did HD/ PD mix from 2001- 2008. 2nd transplant ([facility]/ [facility]) was in 2008 and is still working as of 12/2021. F/b Dr Gearline ([place]) and [facility] transplant team.   Leg edema    Long term (current) use of calcineurin inhibitor  08/21/2022   Long term (current) use of inhibitors of nucleotide synthesis 08/21/2022   Macular degeneration    Mechanical complication of other vascular device, implant, and graft 09/02/2012   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   Metabolic acidosis    Murmur 01/19/2019   never has caused any problems   NAION (non-arteritic anterior ischemic optic neuropathy), 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   Nausea and vomiting 01/19/2019   Nausea and vomiting 01/19/2019   Nephrogenic diabetes insipidus    congenital   Obesity, Class I, BMI 30-34.9 12/21/2021   Other chronic sinusitis 10/29/2018   Other fluid overload 10/12/2022   Pain, unspecified 08/28/2022   Palpitations    Pancreatic duct dilated 02/15/2022   Pancreatitis    Perianal lesion 04/22/2018   Phlebitis and thrombophlebitis of other sites 08/02/2012   Plantar fasciitis 07/03/2018   Pre-transplant evaluation for kidney transplant 01/09/2022   Preop cardiovascular exam 05/09/2022   Pruritus ani 06/26/2018   Renal failure, chronic 11/03/2021   Dialysis in the past   Right lower quadrant abdominal pain 10/29/2018   [facility] spotted fever    S/p cadaver renal transplant 08/02/2012   Secondary hyperparathyroidism of renal origin    Secondary hypertension due to renal disease 10/29/2018   Sesamoiditis 07/03/2018   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    Transplanted organ removal status 10/29/2018   Unstable angina (HCC) 01/19/2019   Urinary tract infection    Vision loss 06/15/2022   Vulvar dystrophy    Yeast dermatitis 08/14/2018   Past Surgical History:  Procedure Laterality Date   A/V FISTULAGRAM Right 06/11/2024   Procedure: A/V Fistulagram;  Surgeon: Sheree Penne Bruckner, MD;  Location: [facility];  Service: Cardiovascular;  Laterality: Right;   AV FISTULA PLACEMENT Right    AV FISTULA PLACEMENT Right 05/17/2022   Procedure: RIGHT ARM BRACHIOCEPHALIC ARTERIOVENOUS (AV) FISTULA CREATION;   Surgeon: Lanis Fonda BRAVO, MD;  Location: [facility] OR;  Service: Vascular;  Laterality: Right;   COLONOSCOPY  11/28/2005   [facility]   COLONOSCOPY  04/13/2016   Dr Anette   CORONARY PRESSURE/FFR STUDY N/A 11/29/2022   Procedure: INTRAVASCULAR PRESSURE WIRE/FFR STUDY;  Surgeon: Wendel Lurena POUR, MD;  Location: [facility];  Service: Cardiovascular;  Laterality: N/A;   drain tube placed     Abdomen for abscess   ESOPHAGOGASTRODUODENOSCOPY  07/31/2017   Distal esophageal ulcers (likely due to gastroeesphageal reflix-biopsied). Small hiatal hernia. Erosive gastritis.   FISTULA SUPERFICIALIZATION Right 07/17/2022   Procedure: SUPERFICIALIZATION AND REVISION RIGHT ARM FISTULA;  Surgeon: Lanis Fonda BRAVO, MD;  Location: [facility] OR;  Service: Vascular;  Laterality: Right;   HEMORRHOID SURGERY     2019, and 04/29/2020   INSERTION OF DIALYSIS CATHETER     KIDNEY TRANSPLANT  1995, 2008   KNEE SURGERY Left 2020   LEFT HEART CATH AND CORONARY ANGIOGRAPHY N/A 11/29/2022  Procedure: LEFT HEART CATH AND CORONARY ANGIOGRAPHY;  Surgeon: Wendel Lurena POUR, MD;  Location: [facility];  Service: Cardiovascular;  Laterality: N/A;   LIVER BIOPSY     May 2025   NECK SURGERY     bone spurs and surgery for pinched nerve March 2025   PARATHYROIDECTOMY  2004   Portion on the parathyroid  gland transplanted in R forearm   REMOVAL OF A DIALYSIS CATHETER     SIMPLE VULVECTOMY  06/19/2011   Partial simple left   SKIN CANCER EXCISION  2016   Per pt, area removed on scalp showed squamous cell carcinoma   TUBAL LIGATION     UPPER GASTROINTESTINAL ENDOSCOPY     VENOUS ANGIOPLASTY  06/11/2024   Procedure: VENOUS ANGIOPLASTY;  Surgeon: Sheree Penne Bruckner, MD;  Location: [facility];  Service: Cardiovascular;;  Cephalic Arch; Innominate Vein   VENOUS STENT Right 06/11/2024   Procedure: VENOUS STENT;  Surgeon: Sheree Penne Bruckner, MD;  Location: [facility];  Service: Cardiovascular;  Laterality: Right;   9x5 Viabahn   WISDOM TOOTH EXTRACTION       MEDICATIONS:   Medications Prior to Admission  Medication Sig Dispense Refill Last Dose/Taking   acetaminophen  (TYLENOL ) 500 MG tablet Take 500 mg by mouth every 6 (six) hours as needed for headache or moderate pain (pain score 4-6).   08/11/2024   allopurinol  (ZYLOPRIM ) 100 MG tablet Take 100 mg by mouth daily.   08/12/2024 at  5:30 AM   ALPRAZolam  (XANAX ) 1 MG tablet Take 1 mg by mouth at bedtime.  1 08/11/2024   aspirin  EC 81 MG tablet Take 81 mg by mouth every Tuesday, Thursday, Saturday, and Sunday.   Past Month   carvedilol  (COREG ) 12.5 MG tablet Take 12.5 mg by mouth 2 (two) times daily. Holds if systolic BP is 110   08/12/2024 at  5:30 AM   COZAAR 50 MG tablet Take 50 mg by mouth 2 (two) times daily. Hold for systolic BP of 110   08/11/2024   famotidine  (PEPCID ) 20 MG tablet Take 20 mg by mouth daily.   08/12/2024 at  5:30 AM   furosemide (LASIX) 40 MG tablet Take 40 mg by mouth daily as needed for fluid or edema.    Past Month   gabapentin  (NEURONTIN ) 300 MG capsule Take 300 mg by mouth at bedtime.   08/11/2024   hydrALAZINE  (APRESOLINE ) 25 MG tablet Take 1 tablet (25 mg total) by mouth 3 (three) times daily. 270 tablet 3 08/12/2024 at  5:30 AM   Magnesium  Oxide -Mg Supplement 250 MG TABS Take 400 mg by mouth 2 (two) times daily.   08/11/2024   meclizine  (ANTIVERT ) 25 MG tablet Take 25 mg by mouth 2 (two) times daily as needed for dizziness or nausea.   08/11/2024   medroxyPROGESTERone  (PROVERA ) 10 MG tablet Take 10 mg by mouth daily.  3 08/12/2024 at  5:30 AM   ondansetron  (ZOFRAN -ODT) 4 MG disintegrating tablet Take 4 mg by mouth every 8 (eight) hours as needed.   08/12/2024 at  5:30 AM   pantoprazole  (PROTONIX ) 40 MG tablet TAKE 1 TABLET(40 MG) BY MOUTH DAILY 90 tablet 1 08/12/2024 at  5:30 AM   Psyllium (METAMUCIL PO) Take 1 capsule by mouth daily.   Past Week   sertraline  (ZOLOFT ) 50 MG tablet Take 50 mg by mouth daily.   08/12/2024 at  5:30  AM   sevelamer  carbonate (RENVELA ) 800 MG tablet Take 800 mg by mouth 3 (  three) times daily with meals.   08/11/2024   simvastatin  (ZOCOR ) 5 MG tablet Take 5 mg by mouth at bedtime.   08/11/2024   traZODone  (DESYREL ) 100 MG tablet Take 100 mg by mouth at bedtime.   08/11/2024   nitroGLYCERIN  (NITROSTAT ) 0.4 MG SL tablet Place 1 tablet (0.4 mg total) under the tongue every 5 (five) minutes as needed for chest pain. 25 tablet 6 More than a month   promethazine  (PHENERGAN ) 12.5 MG tablet Take 12.5 mg by mouth every 6 (six) hours as needed for nausea or vomiting. (Patient not taking: Reported on 08/06/2024)   More than a month    ALLERGIES:   Allergies  Allergen Reactions   Bactrim [Sulfamethoxazole-Trimethoprim] Anaphylaxis   Sulfamethoxazole Anaphylaxis   Ambien [Zolpidem]     Sleep walking, hallucinations   Hydrogen Peroxide Rash   Labetalol  Hives and Rash   Wound Dressing Adhesive Hives   Ace Inhibitors Other (See Comments)    Due to Kidney Transplant   Amlodipine Swelling   Hyoscyamine Other (See Comments)    Dizziness    Keflex [Cephalexin] Hives and Swelling    Per pt, her face and lips swell up and she develops hives   Orphenadrine Hives   Adhesive [Tape] Rash    Can not use for long periods of time (3 days or more)   Imuran [Azathioprine Sodium] Rash   Neosporin [Bacitracin-Polymyxin B] Rash and Other (See Comments)    Can use that for awhile, but will eventually develop a rash    Tramadol Rash   Warfarin Sodium  Rash    REVIEW OF SYSTEMS:  Pertinent items are noted in HPI.   FAMILY HISTORY:   Family History  Problem Relation Age of Onset   Hypertension Mother    Hyperlipidemia Mother    Hypercholesterolemia Mother    Diabetes Father    Thyroid  cancer Father    Lung cancer Father    Hypercholesterolemia Father    Hypertension Father    Rectal cancer Maternal Grandmother    Colon cancer Maternal Grandmother    Esophageal cancer Neg Hx    Stomach cancer Neg Hx      SOCIAL HISTORY:   Social History   Tobacco Use   Smoking status: Never    Passive exposure: Never   Smokeless tobacco: Never  Substance Use Topics   Alcohol use: No     EXAMINATION:  Vital signs in last 24 hours: Temp:  [98.4 F (36.9 C)] 98.4 F (36.9 C) (12/10 0645) Pulse Rate:  [82] 82 (12/10 0645) Resp:  [18] 18 (12/10 0645) BP: (129)/(74) 129/74 (12/10 0645) SpO2:  [100 %] 100 % (12/10 0645) Weight:  [77.4 kg] 77.4 kg (12/10 0645)  BP 129/74   Pulse 82   Temp 98.4 F (36.9 C) (Oral)   Resp 18   Ht 4' 11 (1.499 m)   Wt 77.4 kg   LMP 09/13/2013   SpO2 100%   BMI 34.46 kg/m   General Appearance:    Alert, cooperative, no distress, appears stated age  Head:    Normocephalic, without obvious abnormality, atraumatic  Eyes:    PERRL, conjunctiva/corneas clear, EOM's intact, fundi    benign, both eyes  Ears:    Normal TM's and external ear canals, both ears  Nose:   Nares normal, septum midline, mucosa normal, no drainage    or sinus tenderness  Throat:   Lips, mucosa, and tongue normal; teeth and gums normal  Neck:   Supple, symmetrical,  trachea midline, no adenopathy;    thyroid :  no enlargement/tenderness/nodules; no carotid   bruit or JVD  Back:     Symmetric, no curvature, ROM normal, no CVA tenderness  Lungs:     Clear to auscultation bilaterally, respirations unlabored  Chest Wall:    No tenderness or deformity   Heart:    Regular rate and rhythm, S1 and S2 normal, no murmur, rub   or gallop  Breast Exam:    No tenderness, masses, or nipple abnormality  Abdomen:     Soft, non-tender, bowel sounds active all four quadrants,    no masses, no organomegaly  Genitalia:    Normal female without lesion, discharge or tenderness  Rectal:    Normal tone, normal prostate, no masses or tenderness;   guaiac negative stool  Extremities:   Extremities normal, atraumatic, no cyanosis or edema  Pulses:   2+ and symmetric all extremities  Skin:   Skin color, texture,  turgor normal, no rashes or lesions  Lymph nodes:   Cervical, supraclavicular, and axillary nodes normal  Neurologic:   CNII-XII intact, normal strength, sensation and reflexes    throughout    Musculoskeletal:  ROM 0-110, Ligaments intact,  Imaging Review Plain radiographs demonstrate severe degenerative joint disease of the left knee. The overall alignment is mild varus. The bone quality appears to be fair for age and reported activity level.  Assessment/Plan: Primary osteoarthritis, left knee   The patient history, physical examination and imaging studies are consistent with advanced degenerative joint disease of the left knee. The patient has failed conservative treatment.  The clearance notes were reviewed.  After discussion with the patient it was felt that Total Knee Replacement was indicated. The procedure,  risks, and benefits of total knee arthroplasty were presented and reviewed. The risks including but not limited to aseptic loosening, infection, blood clots, vascular injury, stiffness, patella tracking problems complications among others were discussed. The patient acknowledged the explanation, agreed to proceed with the plan.  Preoperative templating of the joint replacement has been completed, documented, and submitted to the Operating Room personnel in order to optimize intra-operative equipment management.    Patient's anticipated LOS is less than 2 midnights, meeting these requirements: - Younger than 58 - Lives within 1 hour of care - Has a competent adult at home to recover with post-op recover - NO history of  - Chronic pain requiring opiods  - Diabetes  - Coronary Artery Disease  - Heart failure  - Heart attack  - Stroke  - DVT/VTE  - Cardiac arrhythmia  - Respiratory Failure/COPD  - Advanced Liver disease     Ariel [name],STEPHEN D 08/12/2024, 8:08 AM

## 2024-08-12 NOTE — Anesthesia Postprocedure Evaluation (Signed)
 Anesthesia Post Note  Patient: Chelly Dawn Dorer  Procedure(s) Performed: ARTHROPLASTY, KNEE, UNICOMPARTMENTAL (Left: Knee)     Patient location during evaluation: PACU Anesthesia Type: Spinal, Regional and MAC Level of consciousness: oriented and awake and alert Pain management: pain level controlled Vital Signs Assessment: post-procedure vital signs reviewed and stable Respiratory status: spontaneous breathing, respiratory function stable and patient connected to nasal cannula oxygen Cardiovascular status: blood pressure returned to baseline and stable Postop Assessment: no headache, no backache and no apparent nausea or vomiting Anesthetic complications: no   No notable events documented.  Last Vitals:  Vitals:   08/12/24 1200 08/12/24 1215  BP: (!) 150/82 (!) 151/85  Pulse: 83 83  Resp: 19 15  Temp: 36.7 C   SpO2: 95% 96%    Last Pain:  Vitals:   08/12/24 1336  TempSrc:   PainSc: 4                  Katherinne Mofield S

## 2024-08-12 NOTE — Discharge Instructions (Signed)

## 2024-08-13 NOTE — Op Note (Signed)
 UNI KNEE REPLACEMENT OPERATIVE NOTE:  08/12/2024  2:21 PM  PATIENT:  Ariel [name]  48 y.o. female  PRE-OPERATIVE DIAGNOSIS:  Primary osteoarthritis of left knee M17.12  POST-OPERATIVE DIAGNOSIS:  Primary osteoarthritis of left knee M17.12  PROCEDURE:  Procedures: ARTHROPLASTY, KNEE, UNICOMPARTMENTAL  SURGEON:  Surgeon(s): Rubie Kemps, MD  PHYSICIAN ASSISTANT: Almeda Rummer PA-C  ANESTHESIA:   spinal  DRAINS: Hemovac  SPECIMEN: None  COUNTS:  Correct  TOURNIQUET:   Total Tourniquet Time Documented: Thigh (Left) - 2 minutes Thigh (Left) - 3 minutes Total: Thigh (Left) - 5 minutes   DICTATION:  Indication for procedure:    The patient is a 48 y.o. female who has failed conservative treatment for Primary osteoarthritis of left knee M17.12.  Informed consent was obtained prior to anesthesia. The risks versus benefits of the operation were explain and in a way the patient can, and did, understand.   On the implant demand matching protocol, this patient scored 7.  Therefore, this patient did did not receive a polyethylene insert with vitamin E which is a high demand implant.  Description of procedure:     The patient was taken to the operating room and placed under anesthesia.  The patient was positioned in the usual fashion taking care that all body parts were adequately padded and/or protected.  I foley catheter was not placed.  A tourniquet was applied and the leg prepped and draped in the usual sterile fashion.  The extremity was exsanguinated with the esmarch and tourniquet inflated to 350 mmHg.  Pre-operative range of motion was normal.  The knee was in 5 degree of mild varus.  A midline incision approximately 3-4 inches long was made with a #10 blade.  A new blade was used to make a parapatellar arthrotomy going 1 cm into the quadriceps tendon, over the patella, and alongside the medial aspect of the patellar tendon.  A synovectomy was then performed  with the #10 blade and forceps. I then elevated the deep MCL off the medial tibial flare. The knee was put at 90 degrees and the patient specific cutting blocks were used to make our proximal tibial cut and distal femoral cut. The medial meniscus was removed at this point.  I used a size 1 femur finishing block and a size 1 tibial tray drilling keel.  I trialed with #1 femur #1 tibia and 9 mm poly.  Balance was good.  Remove the components and copiously irrigated.  I then irrigated copiously and then mixed the cement. I injected exparel  in the deep soft tissues at this point. I then cemented the tibia first followed by the femur and removed excess cement and then inserted the polyethylene. I placed the leg in extension and finished injecting the rest of the exparel . Once the cement was hard, the tourniquet was let down. Hemostasis was obtained. The arthrotomy was closed using a #1 stratofix running suture. The deep soft tissue was closed with #0 vicryls and the subcuticular layer closed with a #2.0 vicryl. The skin was reapproximated and closed using a running 3.0 monocryl. The wound was covered with steristrips, aquacel dressing, and a TED stocking. The patient was awakened, extubated, and taken to recovery in a stable condition.   BLOOD LOSS:  300cc COMPLICATIONS:  None.  PLAN OF CARE: Admit for overnight observation  PATIENT DISPOSITION:  PACU - hemodynamically stable.   Delay start of Pharmacological VTE agent (>24hrs) due to surgical blood loss or risk of bleeding:  not applicable  Please fax a copy of this op note to my office at [phone] (please only include page 1 and 2 of the Case Information op note)

## 2024-08-14 ENCOUNTER — Encounter (HOSPITAL_COMMUNITY): Payer: Self-pay | Admitting: Orthopedic Surgery

## 2024-08-17 ENCOUNTER — Other Ambulatory Visit: Payer: Self-pay

## 2024-08-17 ENCOUNTER — Emergency Department (HOSPITAL_COMMUNITY)

## 2024-08-17 ENCOUNTER — Encounter (HOSPITAL_COMMUNITY): Payer: Self-pay | Admitting: Emergency Medicine

## 2024-08-17 ENCOUNTER — Observation Stay (HOSPITAL_COMMUNITY)
Admission: EM | Admit: 2024-08-17 | Discharge: 2024-08-19 | DRG: 177 | Disposition: A | Attending: Emergency Medicine | Admitting: Emergency Medicine

## 2024-08-17 DIAGNOSIS — E66811 Obesity, class 1: Secondary | ICD-10-CM | POA: Diagnosis present

## 2024-08-17 DIAGNOSIS — F32A Depression, unspecified: Secondary | ICD-10-CM | POA: Diagnosis present

## 2024-08-17 DIAGNOSIS — Z885 Allergy status to narcotic agent status: Secondary | ICD-10-CM

## 2024-08-17 DIAGNOSIS — Z881 Allergy status to other antibiotic agents status: Secondary | ICD-10-CM

## 2024-08-17 DIAGNOSIS — J690 Pneumonitis due to inhalation of food and vomit: Secondary | ICD-10-CM

## 2024-08-17 DIAGNOSIS — Z79890 Hormone replacement therapy (postmenopausal): Secondary | ICD-10-CM

## 2024-08-17 DIAGNOSIS — Z9885 Transplanted organ removal status: Secondary | ICD-10-CM

## 2024-08-17 DIAGNOSIS — Z905 Acquired absence of kidney: Secondary | ICD-10-CM

## 2024-08-17 DIAGNOSIS — E7800 Pure hypercholesterolemia, unspecified: Secondary | ICD-10-CM | POA: Diagnosis present

## 2024-08-17 DIAGNOSIS — Z1152 Encounter for screening for COVID-19: Secondary | ICD-10-CM

## 2024-08-17 DIAGNOSIS — Z8249 Family history of ischemic heart disease and other diseases of the circulatory system: Secondary | ICD-10-CM

## 2024-08-17 DIAGNOSIS — E039 Hypothyroidism, unspecified: Secondary | ICD-10-CM | POA: Diagnosis present

## 2024-08-17 DIAGNOSIS — Z86008 Personal history of in-situ neoplasm of other site: Secondary | ICD-10-CM

## 2024-08-17 DIAGNOSIS — H5461 Unqualified visual loss, right eye, normal vision left eye: Secondary | ICD-10-CM | POA: Diagnosis present

## 2024-08-17 DIAGNOSIS — Z7982 Long term (current) use of aspirin: Secondary | ICD-10-CM

## 2024-08-17 DIAGNOSIS — I10 Essential (primary) hypertension: Secondary | ICD-10-CM | POA: Diagnosis present

## 2024-08-17 DIAGNOSIS — Z883 Allergy status to other anti-infective agents status: Secondary | ICD-10-CM

## 2024-08-17 DIAGNOSIS — D62 Acute posthemorrhagic anemia: Secondary | ICD-10-CM | POA: Diagnosis not present

## 2024-08-17 DIAGNOSIS — Z882 Allergy status to sulfonamides status: Secondary | ICD-10-CM

## 2024-08-17 DIAGNOSIS — N186 End stage renal disease: Secondary | ICD-10-CM | POA: Diagnosis present

## 2024-08-17 DIAGNOSIS — Z79621 Long term (current) use of calcineurin inhibitor: Secondary | ICD-10-CM

## 2024-08-17 DIAGNOSIS — D631 Anemia in chronic kidney disease: Secondary | ICD-10-CM | POA: Diagnosis present

## 2024-08-17 DIAGNOSIS — Z8719 Personal history of other diseases of the digestive system: Secondary | ICD-10-CM

## 2024-08-17 DIAGNOSIS — G9340 Encephalopathy, unspecified: Secondary | ICD-10-CM | POA: Diagnosis present

## 2024-08-17 DIAGNOSIS — Z87892 Personal history of anaphylaxis: Secondary | ICD-10-CM

## 2024-08-17 DIAGNOSIS — I120 Hypertensive chronic kidney disease with stage 5 chronic kidney disease or end stage renal disease: Secondary | ICD-10-CM | POA: Diagnosis present

## 2024-08-17 DIAGNOSIS — Z6834 Body mass index (BMI) 34.0-34.9, adult: Secondary | ICD-10-CM

## 2024-08-17 DIAGNOSIS — Z86718 Personal history of other venous thrombosis and embolism: Secondary | ICD-10-CM

## 2024-08-17 DIAGNOSIS — J189 Pneumonia, unspecified organism: Secondary | ICD-10-CM | POA: Diagnosis present

## 2024-08-17 DIAGNOSIS — Z79899 Other long term (current) drug therapy: Secondary | ICD-10-CM

## 2024-08-17 DIAGNOSIS — R410 Disorientation, unspecified: Principal | ICD-10-CM

## 2024-08-17 DIAGNOSIS — Z86004 Personal history of in-situ neoplasm of other and unspecified digestive organs: Secondary | ICD-10-CM

## 2024-08-17 DIAGNOSIS — N2581 Secondary hyperparathyroidism of renal origin: Secondary | ICD-10-CM | POA: Diagnosis present

## 2024-08-17 DIAGNOSIS — Z8672 Personal history of thrombophlebitis: Secondary | ICD-10-CM

## 2024-08-17 DIAGNOSIS — F419 Anxiety disorder, unspecified: Secondary | ICD-10-CM | POA: Diagnosis present

## 2024-08-17 DIAGNOSIS — Z940 Kidney transplant status: Secondary | ICD-10-CM

## 2024-08-17 DIAGNOSIS — Z800 Family history of malignant neoplasm of digestive organs: Secondary | ICD-10-CM

## 2024-08-17 DIAGNOSIS — Z801 Family history of malignant neoplasm of trachea, bronchus and lung: Secondary | ICD-10-CM

## 2024-08-17 DIAGNOSIS — G928 Other toxic encephalopathy: Secondary | ICD-10-CM | POA: Diagnosis present

## 2024-08-17 DIAGNOSIS — Z833 Family history of diabetes mellitus: Secondary | ICD-10-CM

## 2024-08-17 DIAGNOSIS — Z888 Allergy status to other drugs, medicaments and biological substances status: Secondary | ICD-10-CM

## 2024-08-17 DIAGNOSIS — T50915A Adverse effect of multiple unspecified drugs, medicaments and biological substances, initial encounter: Secondary | ICD-10-CM | POA: Diagnosis present

## 2024-08-17 DIAGNOSIS — R45851 Suicidal ideations: Secondary | ICD-10-CM

## 2024-08-17 DIAGNOSIS — Z992 Dependence on renal dialysis: Secondary | ICD-10-CM

## 2024-08-17 DIAGNOSIS — Z85828 Personal history of other malignant neoplasm of skin: Secondary | ICD-10-CM

## 2024-08-17 DIAGNOSIS — Z83438 Family history of other disorder of lipoprotein metabolism and other lipidemia: Secondary | ICD-10-CM

## 2024-08-17 DIAGNOSIS — Z808 Family history of malignant neoplasm of other organs or systems: Secondary | ICD-10-CM

## 2024-08-17 DIAGNOSIS — G9341 Metabolic encephalopathy: Secondary | ICD-10-CM | POA: Diagnosis present

## 2024-08-17 DIAGNOSIS — Z91048 Other nonmedicinal substance allergy status: Secondary | ICD-10-CM

## 2024-08-17 DIAGNOSIS — Z96652 Presence of left artificial knee joint: Secondary | ICD-10-CM | POA: Diagnosis present

## 2024-08-17 DIAGNOSIS — K760 Fatty (change of) liver, not elsewhere classified: Secondary | ICD-10-CM | POA: Diagnosis present

## 2024-08-17 LAB — COMPREHENSIVE METABOLIC PANEL WITH GFR
ALT: 9 U/L (ref 0–44)
AST: 11 U/L — ABNORMAL LOW (ref 15–41)
Albumin: 3.1 g/dL — ABNORMAL LOW (ref 3.5–5.0)
Alkaline Phosphatase: 56 U/L (ref 38–126)
Anion gap: 15 (ref 5–15)
BUN: 37 mg/dL — ABNORMAL HIGH (ref 6–20)
CO2: 22 mmol/L (ref 22–32)
Calcium: 8.8 mg/dL — ABNORMAL LOW (ref 8.9–10.3)
Chloride: 97 mmol/L — ABNORMAL LOW (ref 98–111)
Creatinine, Ser: 9.26 mg/dL — ABNORMAL HIGH (ref 0.44–1.00)
GFR, Estimated: 5 mL/min — ABNORMAL LOW (ref >60)
Glucose, Bld: 73 mg/dL (ref 70–99)
Potassium: 4.9 mmol/L (ref 3.5–5.1)
Sodium: 134 mmol/L — ABNORMAL LOW (ref 135–145)
Total Bilirubin: 1.4 mg/dL — ABNORMAL HIGH (ref 0.0–1.2)
Total Protein: 6.6 g/dL (ref 6.5–8.1)

## 2024-08-17 LAB — RAPID URINE DRUG SCREEN, HOSP PERFORMED
Amphetamines: NOT DETECTED
Barbiturates: NOT DETECTED
Benzodiazepines: POSITIVE — AB
Cocaine: NOT DETECTED
Opiates: NOT DETECTED
Tetrahydrocannabinol: NOT DETECTED

## 2024-08-17 LAB — I-STAT VENOUS BLOOD GAS, ED
Acid-base deficit: 2 mmol/L (ref 0.0–2.0)
Bicarbonate: 22.7 mmol/L (ref 20.0–28.0)
Calcium, Ion: 1.02 mmol/L — ABNORMAL LOW (ref 1.15–1.40)
HCT: 33 % — ABNORMAL LOW (ref 36.0–46.0)
Hemoglobin: 11.2 g/dL — ABNORMAL LOW (ref 12.0–15.0)
O2 Saturation: 78 %
Potassium: 4.8 mmol/L (ref 3.5–5.1)
Sodium: 131 mmol/L — ABNORMAL LOW (ref 135–145)
TCO2: 24 mmol/L (ref 22–32)
pCO2, Ven: 37.3 mmHg — ABNORMAL LOW (ref 44–60)
pH, Ven: 7.392 (ref 7.25–7.43)
pO2, Ven: 42 mmHg (ref 32–45)

## 2024-08-17 LAB — URINALYSIS, ROUTINE W REFLEX MICROSCOPIC
Bilirubin Urine: NEGATIVE
Glucose, UA: 50 mg/dL — AB
Ketones, ur: 5 mg/dL — AB
Nitrite: NEGATIVE
Protein, ur: 100 mg/dL — AB
Specific Gravity, Urine: 1.009 (ref 1.005–1.030)
pH: 8 (ref 5.0–8.0)

## 2024-08-17 LAB — CBC
HCT: 29.6 % — ABNORMAL LOW (ref 36.0–46.0)
Hemoglobin: 9.2 g/dL — ABNORMAL LOW (ref 12.0–15.0)
MCH: 29.8 pg (ref 26.0–34.0)
MCHC: 31.1 g/dL (ref 30.0–36.0)
MCV: 95.8 fL (ref 80.0–100.0)
Platelets: 228 K/uL (ref 150–400)
RBC: 3.09 MIL/uL — ABNORMAL LOW (ref 3.87–5.11)
RDW: 15.6 % — ABNORMAL HIGH (ref 11.5–15.5)
WBC: 6.7 K/uL (ref 4.0–10.5)
nRBC: 0 % (ref 0.0–0.2)

## 2024-08-17 LAB — CBG MONITORING, ED: Glucose-Capillary: 84 mg/dL (ref 70–99)

## 2024-08-17 MED ORDER — HALOPERIDOL LACTATE 5 MG/ML IJ SOLN: 5.0000 mg | Freq: Four times a day (QID) | INTRAMUSCULAR | Status: DC | PRN

## 2024-08-17 MED ORDER — ACETAMINOPHEN 325 MG PO TABS
650.0000 mg | ORAL_TABLET | Freq: Four times a day (QID) | ORAL | Status: DC | PRN
  Administered 2024-08-18: 17:00:00 650 mg via ORAL
  Filled 2024-08-17: qty 2

## 2024-08-17 MED ORDER — ASPIRIN 81 MG PO TBEC
81.0000 mg | DELAYED_RELEASE_TABLET | Freq: Two times a day (BID) | ORAL | Status: DC
  Administered 2024-08-18 – 2024-08-19 (×3): 81 mg via ORAL
  Filled 2024-08-17 (×3): qty 1

## 2024-08-17 MED ORDER — SENNA 8.6 MG PO TABS: 1.0000 | ORAL_TABLET | Freq: Every day | ORAL | Status: DC | PRN

## 2024-08-17 MED ORDER — HEPARIN SODIUM (PORCINE) 5000 UNIT/ML IJ SOLN
5000.0000 [IU] | Freq: Three times a day (TID) | INTRAMUSCULAR | Status: DC
  Administered 2024-08-17 – 2024-08-19 (×6): 5000 [IU] via SUBCUTANEOUS
  Filled 2024-08-17 (×6): qty 1

## 2024-08-17 MED ORDER — SODIUM CHLORIDE 0.9 % IV SOLN
100.0000 mg | Freq: Two times a day (BID) | INTRAVENOUS | Status: DC
  Administered 2024-08-18 – 2024-08-19 (×4): 100 mg via INTRAVENOUS
  Filled 2024-08-17 (×5): qty 100

## 2024-08-17 MED ORDER — LOSARTAN POTASSIUM 50 MG PO TABS
50.0000 mg | ORAL_TABLET | Freq: Two times a day (BID) | ORAL | Status: DC
  Administered 2024-08-17 – 2024-08-19 (×4): 50 mg via ORAL
  Filled 2024-08-17 (×4): qty 1

## 2024-08-17 MED ORDER — SODIUM CHLORIDE 0.9 % IV SOLN
3.0000 g | INTRAVENOUS | Status: DC
  Administered 2024-08-18: 21:00:00 3 g via INTRAVENOUS
  Filled 2024-08-17: qty 8

## 2024-08-17 MED ORDER — PROCHLORPERAZINE EDISYLATE 10 MG/2ML IJ SOLN: 5.0000 mg | Freq: Four times a day (QID) | INTRAMUSCULAR | Status: DC | PRN

## 2024-08-17 MED ORDER — SODIUM CHLORIDE 0.9 % IV SOLN
3.0000 g | Freq: Once | INTRAVENOUS | Status: AC
  Administered 2024-08-17: 3 g via INTRAVENOUS
  Filled 2024-08-17: qty 8

## 2024-08-17 MED ORDER — CARVEDILOL 12.5 MG PO TABS
12.5000 mg | ORAL_TABLET | Freq: Two times a day (BID) | ORAL | Status: DC
  Administered 2024-08-17 – 2024-08-19 (×4): 12.5 mg via ORAL
  Filled 2024-08-17 (×4): qty 1

## 2024-08-17 MED ORDER — SODIUM CHLORIDE 0.9% FLUSH
3.0000 mL | Freq: Two times a day (BID) | INTRAVENOUS | Status: DC
  Administered 2024-08-18 – 2024-08-19 (×3): 3 mL via INTRAVENOUS

## 2024-08-17 MED ORDER — HALOPERIDOL LACTATE 5 MG/ML IJ SOLN
2.0000 mg | Freq: Once | INTRAMUSCULAR | Status: AC
  Administered 2024-08-17: 22:00:00 2 mg via INTRAVENOUS
  Filled 2024-08-17: qty 1

## 2024-08-17 MED ORDER — ALLOPURINOL 100 MG PO TABS
100.0000 mg | ORAL_TABLET | Freq: Every day | ORAL | Status: DC
  Administered 2024-08-18 – 2024-08-19 (×2): 100 mg via ORAL
  Filled 2024-08-17 (×2): qty 1

## 2024-08-17 MED ORDER — LEVOFLOXACIN IN D5W 750 MG/150ML IV SOLN: 750.0000 mg | Freq: Once | INTRAVENOUS | Status: DC

## 2024-08-17 MED ORDER — SIMVASTATIN 5 MG PO TABS
5.0000 mg | ORAL_TABLET | Freq: Every day | ORAL | Status: DC
  Administered 2024-08-17 – 2024-08-18 (×2): 5 mg via ORAL
  Filled 2024-08-17 (×3): qty 1

## 2024-08-17 MED ORDER — PANTOPRAZOLE SODIUM 40 MG PO TBEC
40.0000 mg | DELAYED_RELEASE_TABLET | Freq: Every day | ORAL | Status: DC
  Administered 2024-08-18 – 2024-08-19 (×2): 40 mg via ORAL
  Filled 2024-08-17 (×2): qty 1

## 2024-08-17 MED ORDER — HYDRALAZINE HCL 25 MG PO TABS
25.0000 mg | ORAL_TABLET | Freq: Three times a day (TID) | ORAL | Status: DC
  Administered 2024-08-17 – 2024-08-18 (×4): 25 mg via ORAL
  Filled 2024-08-17 (×4): qty 1

## 2024-08-17 NOTE — ED Notes (Signed)
 Bed alarm placed on the pt

## 2024-08-17 NOTE — ED Triage Notes (Signed)
 PT bib mother pt had knee surgery last Wednesday. Two days after surgery pt started to act confused. Pt has had all over body twitches and hallucinations since last Friday. Pt said she has been seeing and hearing things. Pt has been staying with mother since surgery and mom states patient has been very confused. Last night thought someone was stalking her and fell out of bed and was landed on butt. Parents were able to assist pt back into bed. Pt gets dialysis MWF. Mother was called from Dialysis today to come get pt and take to ER due to disorientation. Pt did not receive full treatment.

## 2024-08-17 NOTE — ED Provider Notes (Signed)
 [place] EMERGENCY DEPARTMENT AT [facility] Provider Note   CSN: [id number] Arrival date & time: 08/17/24  1159     Patient presents with: Altered Mental Status   Ariel [name] is a 48 y.o. female.    Altered Mental Status    48 year old female with medical history significant for ESRD on HD Monday Wednesday Friday presenting to the emergency department with acute confusion and hallucinations over the past few days.  The patient had a knee arthroplasty with Dr. Rubie on 12/10.  She had been discharged and had been fine for the first 24 to 48 hours however on Friday she developed acute confusion and began having hallucinations seeing animals on the walls.  She has no prior psychiatric history.  She additionally has had a slight cough.  Family states that she is not at her baseline mental status and she has been disoriented.  She recently started oxycodone  and Flexeril since her surgery.  No other recent medication changes.  He is on dialysis and did not complete a full dialysis session today due to confusion.  Prior to Admission medications  Medication Sig Start Date End Date Taking? Authorizing Provider  acetaminophen  (TYLENOL ) 500 MG tablet Take 500 mg by mouth every 6 (six) hours as needed for headache or moderate pain (pain score 4-6).    [provider]  allopurinol  (ZYLOPRIM ) 100 MG tablet Take 100 mg by mouth daily. 10/13/20   [provider]  ALPRAZolam  (XANAX ) 1 MG tablet Take 1 mg by mouth at bedtime. 05/31/15   [provider]  aspirin  EC 81 MG tablet Take 1 tablet (81 mg total) by mouth 2 (two) times daily. 08/12/24 08/12/25  Henry Slater NOVAK, PA-C  carvedilol  (COREG ) 12.5 MG tablet Take 12.5 mg by mouth 2 (two) times daily. Holds if systolic BP is 110 07/08/24   [provider]  COZAAR  50 MG tablet Take 50 mg by mouth 2 (two) times daily. Hold for systolic BP of 110 09/10/23   [provider]  famotidine  (PEPCID ) 20  MG tablet Take 20 mg by mouth daily.    [provider]  furosemide (LASIX) 40 MG tablet Take 40 mg by mouth daily as needed for fluid or edema.     [provider]  gabapentin  (NEURONTIN ) 300 MG capsule Take 300 mg by mouth at bedtime.    [provider]  hydrALAZINE  (APRESOLINE ) 25 MG tablet Take 1 tablet (25 mg total) by mouth 3 (three) times daily. 09/19/23 08/12/24  Carlin Delon BROCKS, NP  Magnesium  Oxide -Mg Supplement 250 MG TABS Take 400 mg by mouth 2 (two) times daily.    [provider]  meclizine  (ANTIVERT ) 25 MG tablet Take 25 mg by mouth 2 (two) times daily as needed for dizziness or nausea. 06/20/24   [provider]  medroxyPROGESTERone  (PROVERA ) 10 MG tablet Take 10 mg by mouth daily. 04/09/17   [provider]  nitroGLYCERIN  (NITROSTAT ) 0.4 MG SL tablet Place 1 tablet (0.4 mg total) under the tongue every 5 (five) minutes as needed for chest pain. 11/21/22   Revankar, Jennifer SAUNDERS, MD  ondansetron  (ZOFRAN -ODT) 4 MG disintegrating tablet Take 4 mg by mouth every 8 (eight) hours as needed. 06/24/24   [provider]  pantoprazole  (PROTONIX ) 40 MG tablet TAKE 1 TABLET(40 MG) BY MOUTH DAILY 03/08/23   Charlanne Groom, MD  promethazine  (PHENERGAN ) 12.5 MG tablet Take 12.5 mg by mouth every 6 (six) hours as needed for nausea or vomiting.  Patient not taking: Reported on 08/06/2024    [provider]  Psyllium (METAMUCIL PO) Take 1 capsule by mouth daily.    [provider]  sertraline  (ZOLOFT ) 50 MG tablet Take 50 mg by mouth daily.    [provider]  [Paused] sevelamer  carbonate (RENVELA ) 800 MG tablet Take 800 mg by mouth 3 (three) times daily with meals. Wait to take this until your doctor or other care provider tells you to start again. 01/04/22   [provider]  simvastatin  (ZOCOR ) 5 MG tablet Take 5 mg by mouth at bedtime. 11/08/21   [provider]  traZODone  (DESYREL ) 100 MG tablet Take 100  mg by mouth at bedtime.    [provider]    Allergies: Bactrim [sulfamethoxazole-trimethoprim], Sulfamethoxazole, Ambien [zolpidem], Hydrogen peroxide, Labetalol , Wound dressing adhesive, Ace inhibitors, Amlodipine, Hyoscyamine, Keflex [cephalexin], Orphenadrine, Adhesive [tape], Imuran [azathioprine sodium], Neosporin [bacitracin-polymyxin b], Tramadol, and Warfarin sodium     Review of Systems  Unable to perform ROS: Mental status change    Updated Vital Signs BP (!) 153/81   Pulse 93   Temp 98.4 F (36.9 C)   Resp 16   Wt 77.3 kg   LMP 09/13/2013   SpO2 99%   BMI 34.42 kg/m   Physical Exam Vitals and nursing note reviewed.  Constitutional:      General: She is not in acute distress.    Appearance: She is well-developed.  HENT:     Head: Normocephalic and atraumatic.  Eyes:     Conjunctiva/sclera: Conjunctivae normal.  Neck:     Comments: Range of motion of the neck intact without pain  Cardiovascular:     Rate and Rhythm: Normal rate and regular rhythm.  Pulmonary:     Effort: Pulmonary effort is normal. No respiratory distress.     Breath sounds: Normal breath sounds.  Abdominal:     Palpations: Abdomen is soft.     Tenderness: There is no abdominal tenderness.  Musculoskeletal:        General: No swelling.     Cervical back: Neck supple.     Comments: Left knee none tender, moves extremities without difficulty  Skin:    General: Skin is warm and dry.     Capillary Refill: Capillary refill takes less than 2 seconds.  Neurological:     Mental Status: She is alert.     Comments: MENTAL STATUS EXAM:    Orientation: Alert and oriented to person, place and time.  Memory: Cooperative, follows commands well.  Language: Speech is clear and language is normal.   CRANIAL NERVES:    CN 2 (Optic): Visual fields intact to confrontation.  CN 3,4,6 (EOM): Pupils equal and reactive to light. Full extraocular eye movement without nystagmus.  CN 5 (Trigeminal):  Facial sensation is normal, no weakness of masticatory muscles.  CN 7 (Facial): No facial weakness or asymmetry.  CN 8 (Auditory): Auditory acuity grossly normal.  CN 9,10 (Glossophar): The uvula is midline, the palate elevates symmetrically.  CN 11 (spinal access): Normal sternocleidomastoid and trapezius strength.  CN 12 (Hypoglossal): The tongue is midline. No atrophy or fasciculations.SABRA   MOTOR:  Muscle Strength: 5/5RUE, 5/5LUE, 5/5RLE, 5/5LLE.   COORDINATION:   No tremor.   SENSATION:   Intact to light touch all four extremities.  GAIT: Gait not assessed   Psychiatric:        Attention and Perception: She is inattentive. She perceives visual hallucinations.        Behavior: Behavior  is agitated.        Thought Content: Thought content is not paranoid. Thought content does not include homicidal or suicidal ideation.     Comments: Actively hallucinating, speaking to individuals when no one is present in the room     (all labs ordered are listed, but only abnormal results are displayed) Labs Reviewed  COMPREHENSIVE METABOLIC PANEL WITH GFR - Abnormal; Notable for the following components:      Result Value   Sodium 134 (*)    Chloride 97 (*)    BUN 37 (*)    Creatinine, Ser 9.26 (*)    Calcium 8.8 (*)    Albumin 3.1 (*)    AST 11 (*)    Total Bilirubin 1.4 (*)    GFR, Estimated 5 (*)    All other components within normal limits  CBC - Abnormal; Notable for the following components:   RBC 3.09 (*)    Hemoglobin 9.2 (*)    HCT 29.6 (*)    RDW 15.6 (*)    All other components within normal limits  URINALYSIS, ROUTINE W REFLEX MICROSCOPIC - Abnormal; Notable for the following components:   APPearance HAZY (*)    Glucose, UA 50 (*)    Hgb urine dipstick SMALL (*)    Ketones, ur 5 (*)    Protein, ur 100 (*)    Leukocytes,Ua TRACE (*)    Bacteria, UA RARE (*)    All other components within normal limits  RAPID URINE DRUG SCREEN, HOSP PERFORMED - Abnormal; Notable for  the following components:   Benzodiazepines POSITIVE (*)    All other components within normal limits  RESP PANEL BY RT-PCR (RSV, FLU A&B, COVID)  [id number]  AMMONIA  TSH  T4, FREE  CBG MONITORING, ED  I-STAT VENOUS BLOOD GAS, ED    EKG: EKG Interpretation Date/Time:  Monday August 17 2024 19:36:27 EST Ventricular Rate:  93 PR Interval:  170 QRS Duration:  78 QT Interval:  351 QTC Calculation: 437 R Axis:   64  Text Interpretation: Sinus rhythm Probable left atrial enlargement Low voltage, precordial leads Confirmed by Jerrol Agent ([id number]) on 08/17/2024 8:38:46 PM  Radiology: DG Chest Portable 1 View Result Date: 08/17/2024 EXAM: 1 VIEW(S) XRAY OF THE CHEST 08/17/2024 07:32:00 PM COMPARISON: None available. CLINICAL HISTORY: ams FINDINGS: LINES, TUBES AND DEVICES: Right subclavian vascular stent noted. LUNGS AND PLEURA: Left basilar airspace opacity. Small left pleural effusion. No pneumothorax. HEART AND MEDIASTINUM: Cardiomegaly. BONES AND SOFT TISSUES: Cervical fusion hardware noted. IMPRESSION: 1. Left basilar airspace opacity with a small left pleural effusion, which may reflect aspiration or pneumonia. 2. Cardiomegaly. Electronically signed by: Greig Pique MD 08/17/2024 07:39 PM EST RP Workstation: [id number]   CT HEAD WO CONTRAST Result Date: 08/17/2024 EXAM: CT HEAD WITHOUT 08/17/2024 02:46:34 PM TECHNIQUE: CT of the head was performed without the administration of intravenous contrast. Automated exposure control, iterative reconstruction, and/or weight based adjustment of the mA/kV was utilized to reduce the radiation dose to as low as reasonably achievable. COMPARISON: MRI brain 01/09/2020. CLINICAL HISTORY: Memory loss; Mental status change, unknown cause. FINDINGS: BRAIN AND VENTRICLES: No acute intracranial hemorrhage. No mass effect or midline shift. No extra-axial fluid collection. No evidence of acute infarct. No hydrocephalus. ORBITS: No acute abnormality. SINUSES AND  MASTOIDS: Left maxillary sinus opacification. Extension through medial left maxillary sinus wall, suggestive of mucocele. SOFT TISSUES AND SKULL: No acute skull fracture. No acute soft tissue abnormality. IMPRESSION: 1. No acute intracranial abnormality. 2.  Left maxillary sinus opacification with extension through the medial left maxillary sinus wall, suggestive of a mucocele. Electronically signed by: Ryan Chess MD 08/17/2024 02:57 PM EST RP Workstation: [id number]     Procedures   Medications Ordered in the ED  Ampicillin -Sulbactam (UNASYN ) 3 g in sodium chloride  0.9 % 100 mL IVPB (has no administration in time range)  haloperidol  lactate (HALDOL ) injection 2 mg (has no administration in time range)    Clinical Course as of 08/17/24 2139  Mon Aug 17, 2024  1852 Bacteria, UA(!): RARE [JL]  1852 Leukocytes,Ua(!): TRACE [JL]  1852 WBC, UA: 0-5 [JL]  1852 Nitrite: NEGATIVE [JL]  1852 Benzodiazepines(!): POSITIVE [JL]    Clinical Course User Index [JL] Jerrol Agent, MD                                 Medical Decision Making Amount and/or Complexity of Data Reviewed Labs: ordered. Decision-making details documented in ED Course. Radiology: ordered.  Risk Prescription drug management. Decision regarding hospitalization.    48 year old female with medical history significant for ESRD on HD Monday Wednesday Friday presenting to the emergency department with acute confusion and hallucinations over the past few days.  The patient had a knee arthroplasty with Dr. Rubie on 12/10.  She had been discharged and had been fine for the first 24 to 48 hours however on Friday she developed acute confusion and began having hallucinations seeing animals on the walls.  She has no prior psychiatric history.  She additionally has had a slight cough.  Family states that she is not at her baseline mental status and she has been disoriented.  She recently started oxycodone  and Flexeril since her  surgery.  No other recent medication changes.  He is on dialysis and did not complete a full dialysis session today due to confusion.  On arrival, the patient was afebrile, not tachycardic or tachypneic, hemodynamically stable, saturating well on room air.  Medical Decision Making:   Ariel [name] is a 48 y.o. female who presented to the ED today with altered mental status detailed above.     Complete initial physical exam performed, notably the patient  was neuro intact, actively hallucinating.    Reviewed and confirmed nursing documentation for past medical history, family history, social history.    Initial Assessment:   With the patient's presentation of altered mental status, most likely diagnosis is delerium 2/2 infectious etiology (UTI/CAP/URI) vs metabolic abnormality (Na/K/Mg/Ca) vs nonspecific etiology. Other diagnoses were considered including (but not limited to) CVA, ICH, intracranial mass, critical dehydration, heptatic dysfunction, uremia, hypercarbia, intoxication, endrocrine abnormality, toxidrome. These are considered less likely due to history of present illness and physical exam findings.  I additionally considered polypharmacy in the setting of the patient's pain medications and subsequent delirium. This is most consistent with an acute life/limb threatening illness complicated by underlying chronic conditions.  Initial Plan:  CTH to evaluate for intracranial etiology of patient's symptoms  Screening labs including CBC and Metabolic panel to evaluate for infectious or metabolic etiology of disease.  Urinalysis with reflex culture ordered to evaluate for UTI or relevant urologic/nephrologic pathology.  CXR to evaluate for structural/infectious intrathoracic pathology.  TSH for evaluation for endrocrine etiology Drug screen for toxidrome evaluation VBG for acid/base status and further toxidrome evaulation EKG to evaluate for cardiac pathology Objective evaluation as  below reviewed   Initial Study Results:   Laboratory  All laboratory results  reviewed without evidence of clinically relevant pathology.   Exceptions include: Mild anemia to 9.2, CMP without significant electrolyte abnormality, creatinine elevated given the patient's ESRD at 9.26, BUN 37, urinalysis unconvincing for urinary tract infection with negative nitrites, 0-5 WBCs and only rare bacteria with squamous cells present.  BG normal, TSH, free T4, ammonia, VBG, COVID and flu PCR testing pending.  UDS was positive for benzodiazepines.  EKG EKG was reviewed independently. Rate, rhythm, axis, intervals all examined and without medically relevant abnormality. ST segments without concerns for elevations.    Radiology:  All images reviewed independently. Agree with radiology report at this time.   DG Chest Portable 1 View Result Date: 08/17/2024 EXAM: 1 VIEW(S) XRAY OF THE CHEST 08/17/2024 07:32:00 PM COMPARISON: None available. CLINICAL HISTORY: ams FINDINGS: LINES, TUBES AND DEVICES: Right subclavian vascular stent noted. LUNGS AND PLEURA: Left basilar airspace opacity. Small left pleural effusion. No pneumothorax. HEART AND MEDIASTINUM: Cardiomegaly. BONES AND SOFT TISSUES: Cervical fusion hardware noted. IMPRESSION: 1. Left basilar airspace opacity with a small left pleural effusion, which may reflect aspiration or pneumonia. 2. Cardiomegaly. Electronically signed by: Greig Pique MD 08/17/2024 07:39 PM EST RP Workstation: [id number]   CT HEAD WO CONTRAST Result Date: 08/17/2024 EXAM: CT HEAD WITHOUT 08/17/2024 02:46:34 PM TECHNIQUE: CT of the head was performed without the administration of intravenous contrast. Automated exposure control, iterative reconstruction, and/or weight based adjustment of the mA/kV was utilized to reduce the radiation dose to as low as reasonably achievable. COMPARISON: MRI brain 01/09/2020. CLINICAL HISTORY: Memory loss; Mental status change, unknown cause. FINDINGS:  BRAIN AND VENTRICLES: No acute intracranial hemorrhage. No mass effect or midline shift. No extra-axial fluid collection. No evidence of acute infarct. No hydrocephalus. ORBITS: No acute abnormality. SINUSES AND MASTOIDS: Left maxillary sinus opacification. Extension through medial left maxillary sinus wall, suggestive of mucocele. SOFT TISSUES AND SKULL: No acute skull fracture. No acute soft tissue abnormality. IMPRESSION: 1. No acute intracranial abnormality. 2. Left maxillary sinus opacification with extension through the medial left maxillary sinus wall, suggestive of a mucocele. Electronically signed by: Ryan Chess MD 08/17/2024 02:57 PM EST RP Workstation: [id number]     Final Assessment and Plan:   Consider polypharmacy in the setting of the patient's pain medication and muscle relaxant use contributing to delirium however patient also found to have likely aspiration pneumonia for which she was started on IV Unasyn .  The patient was mildly agitated and was administered IV Haldol .  She is neuro intact with no evidence of stroke.  CT head was unremarkable other than left maxillary sinus opacification suggestive of a mucocele.  In the setting of aspiration pneumonia and altered mental status/delirium, hospitalist medicine was consulted for admission, Dr. Charlton accepting.      Final diagnoses:  Delirium  Aspiration pneumonia, unspecified aspiration pneumonia type, unspecified laterality, unspecified part of lung [facility])    ED Discharge Orders     None          Jerrol Agent, MD 08/17/24 2139

## 2024-08-17 NOTE — ED Provider Triage Note (Signed)
 Emergency Medicine Provider Triage Evaluation Note  [facility] , a 48 y.o. female  was evaluated in triage.  Pt complains of having partial left knee replacement approximately 5 days ago.  Over the past 2 days patient has been disoriented and confused.  She has been having hallucinations and acting abnormally.  She has started oxycodone  and muscle relaxer since her surgery but otherwise no medication changes.  She did have dialysis today and they know her well sent her here due to confusion.  She denies any specific complaint and does not know why she is here.  Review of Systems  Positive: AMS Negative: fever  Physical Exam  BP (!) 126/90 (BP Location: Left Arm)   Pulse 95   Temp 98.6 F (37 C)   Resp 18   Wt 77.3 kg   LMP 09/13/2013   SpO2 93%   BMI 34.42 kg/m  Gen:   Awake, no distress   Resp:  Normal effort MSK:   Moves extremities without difficulty  Other:  Limited PE in triage   Medical Decision Making  Medically screening exam initiated at 1:58 PM.  Appropriate orders placed.  Ariel [name] was informed that the remainder of the evaluation will be completed by another provider, this initial triage assessment does not replace that evaluation, and the importance of remaining in the ED until their evaluation is complete.     [name] SAILOR, PA-C 08/17/24 1400

## 2024-08-17 NOTE — ED Notes (Signed)
 Patient heard yelling at staff in the room and this RN entered, patient was agitated and attempting to pull cords out of the wall. Patient assisted to bed and reported to staff that she wanted to die, then stated that if she was going to kill anyone she was going to make sure she took 100s with her and she would be in the middle. Patient continued to ask for her mom as well. Provider made aware.

## 2024-08-17 NOTE — H&P (Signed)
 History and Physical    Ariel [name] FMW:[id number] DOB: 1975-10-05 DOA: 08/17/2024  PCP: Ariel Chow, MD   Patient coming from: Home   Chief Complaint: Confusion, agitation, hallucinations   HPI: Ariel [name] is a 48 y.o. 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  Patient underwent left knee surgery on 08/12/2024, has been staying with her mother since then, and was reportedly recovering well until 08/14/2024 when she began exhibiting confusion.  Per report of family, she has been confused, hallucinating, fell out of bed last night, and was unable to complete dialysis today due to her altered mental status.  She has also been coughing.  In the ED, patient reports that she does not feel good at all but is unable to provide any more details or answer basic questions consistently.  ED Course: Upon arrival to the ED, patient is found to be afebrile and saturating mid 90s on room air with normal RR, normal HR, and stable BP.  Labs are most notable for BUN 37, normal WBC, hemoglobin 9.2, and UDS positive for benzodiazepines.  There were no acute findings on head CT.  Chest x-ray concerning for left basilar aspiration or pneumonia.  Patient was treated in the ED with Unasyn  and Haldol .  Review of Systems:  ROS limited by patient's clinical condition.  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   AKI (acute kidney injury) 08/18/2021   Allergy, unspecified, initial encounter 08/28/2022   Anaphylactic shock, unspecified, initial encounter 08/28/2022   Anemia associated with chronic renal failure     Anemia in chronic kidney disease 08/21/2022   Anorectal disorder 06/08/2020   Anorectal pain 06/08/2020   Anxiety disorder 10/29/2018   Back pain    Blind right eye    Can not see straight in front of her. R eye   Bloating 04/16/2018   Breakdown (mechanical) of unspecified cardiac and vascular devices and implants, subsequent encounter 10/12/2022   Cancer (HCC)    pre cervical   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    of the [place]   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 09/05/2022   Carcinoma in situ of vulva 09/05/2022   pre cancerous   Chest pain of uncertain etiology 05/09/2022   Chronic kidney disease, stage V (HCC) 11/03/2021   Complications due to cardiac device, implant, and graft 09/02/2012   Condyloma of female genitalia    Dehydration 01/19/2019   Dependence on renal dialysis 08/21/2022   Depression    Dialysis patient    not currently as of 07/02/22   Drug-induced bleeding disorder 01/19/2019   DVT of axillary vein, acute left (HCC) 08/02/2012   Dysmenorrhea 10/19/2015   Dyspnea on exertion 01/19/2019   Dysuria    End stage renal disease (HCC) 09/02/2012   End stage renal disease on dialysis (HCC) 11/05/2022   ESRD (end stage renal disease) (HCC)    sp transplant x 2, did mix of HD and PD from 2001- 2008   Essential hypertension 11/03/2021   Essential hypertension with goal blood pressure less than 130/80    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   Fatigue    Gastritis, acute  GERD without esophagitis 12/20/2021   Gout    Gout due to renal impairment, unspecified site 08/21/2022   Headache    migraines   History of parathyroidectomy 10/29/2018   History of renal transplant    HTN (hypertension) 01/19/2019   Hypercholesterolemia    Hyperlipidemia    Hyperphosphatemia 12/20/2021   Hypertensive chronic kidney disease with stage 1 through stage 4 chronic kidney disease, or unspecified chronic kidney disease 08/21/2022   Hypokalemia     Hypomagnesemia    Hypothyroid 01/19/2019   IBS (irritable bowel syndrome)    Immunosuppressive management encounter following kidney transplant 10/29/2018   Internal hemorrhoid 04/22/2018   Iron deficiency anemia 10/19/2015   Irregular uterine bleeding 02/13/2016   Kidney disorder 06/08/2020   Kidney transplant recipient 10/29/2018   1st transplant [facility]) was 1995- 2001.  Did HD/ PD mix from 2001- 2008. 2nd transplant ([facility]/ [facility]) was in 2008 and is still working as of 12/2021. F/b Dr Gearline ([place]) and [facility] transplant team.   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   Nausea and vomiting 01/19/2019   Nausea and vomiting 01/19/2019   Nephrogenic diabetes insipidus    congenital   Obesity, Class I, BMI 30-34.9 12/21/2021   Other chronic sinusitis 10/29/2018   Other fluid overload 10/12/2022   Pain, unspecified 08/28/2022   Palpitations    Pancreatic duct dilated 02/15/2022   Pancreatitis    Perianal lesion 04/22/2018   Phlebitis and thrombophlebitis of other sites 08/02/2012   Plantar fasciitis 07/03/2018   Pre-transplant evaluation for kidney transplant 01/09/2022   Preop cardiovascular exam 05/09/2022   Pruritus ani 06/26/2018   Renal failure, chronic 11/03/2021   Dialysis in the past   Right lower quadrant abdominal pain 10/29/2018   [facility] spotted fever    S/p cadaver renal transplant 08/02/2012   Secondary hyperparathyroidism of renal origin    Secondary hypertension due to renal disease 10/29/2018   Sesamoiditis 07/03/2018   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    Transplanted organ removal status 10/29/2018   Unstable angina (HCC) 01/19/2019   Urinary tract infection    Vision loss 06/15/2022   Vulvar dystrophy    Yeast dermatitis 08/14/2018    Past Surgical History:  Procedure Laterality Date   A/V FISTULAGRAM Right 06/11/2024   Procedure: A/V Fistulagram;  Surgeon: Sheree Penne Bruckner, MD;  Location: [facility];  Service: Cardiovascular;  Laterality: Right;   AV FISTULA PLACEMENT Right    AV FISTULA PLACEMENT Right 05/17/2022   Procedure: RIGHT ARM BRACHIOCEPHALIC ARTERIOVENOUS (AV) FISTULA CREATION;  Surgeon: Lanis Fonda BRAVO, MD;  Location: [facility] OR;  Service: Vascular;  Laterality: Right;   COLONOSCOPY  11/28/2005   [facility]   COLONOSCOPY  04/13/2016   Dr Anette   CORONARY PRESSURE/FFR STUDY N/A 11/29/2022   Procedure: INTRAVASCULAR PRESSURE WIRE/FFR STUDY;  Surgeon: Wendel Lurena POUR, MD;  Location: [facility];  Service: Cardiovascular;  Laterality: N/A;   drain tube placed     Abdomen for abscess   ESOPHAGOGASTRODUODENOSCOPY  07/31/2017   Distal  esophageal ulcers (likely due to gastroeesphageal reflix-biopsied). Small hiatal hernia. Erosive gastritis.   FISTULA SUPERFICIALIZATION Right 07/17/2022   Procedure: SUPERFICIALIZATION AND REVISION RIGHT ARM FISTULA;  Surgeon: Lanis Fonda BRAVO, MD;  Location: [facility] OR;  Service: Vascular;  Laterality: Right;   HEMORRHOID SURGERY     2019, and 04/29/2020   INSERTION OF DIALYSIS CATHETER     KIDNEY TRANSPLANT  1995, 2008   KNEE SURGERY Left 2020   LEFT HEART CATH AND CORONARY ANGIOGRAPHY N/A 11/29/2022   Procedure: LEFT HEART CATH AND CORONARY ANGIOGRAPHY;  Surgeon: Wendel Lurena POUR, MD;  Location: [facility];  Service: Cardiovascular;  Laterality: N/A;   LIVER BIOPSY     May 2025   NECK SURGERY     bone spurs and surgery for pinched nerve March 2025   PARATHYROIDECTOMY  2004   Portion on the parathyroid  gland transplanted in R  forearm   PARTIAL KNEE ARTHROPLASTY Left 08/12/2024   Procedure: ARTHROPLASTY, KNEE, UNICOMPARTMENTAL;  Surgeon: Rubie Kemps, MD;  Location: [facility] OR;  Service: Orthopedics;  Laterality: Left;   REMOVAL OF A DIALYSIS CATHETER     SIMPLE VULVECTOMY  06/19/2011   Partial simple left   SKIN CANCER EXCISION  2016   Per pt, area removed on scalp showed squamous cell carcinoma   TUBAL LIGATION     UPPER GASTROINTESTINAL ENDOSCOPY     VENOUS ANGIOPLASTY  06/11/2024   Procedure: VENOUS ANGIOPLASTY;  Surgeon: Sheree Penne Bruckner, MD;  Location: [facility];  Service: Cardiovascular;;  Cephalic Arch; Innominate Vein   VENOUS STENT Right 06/11/2024   Procedure: VENOUS STENT;  Surgeon: Sheree Penne Bruckner, MD;  Location: [facility];  Service: Cardiovascular;  Laterality: Right;  9x5 Viabahn   WISDOM TOOTH EXTRACTION      Social History:   reports that she has never smoked. She has never been exposed to tobacco smoke. She has never used smokeless tobacco. She reports that she does not drink alcohol and does not use drugs.  Allergies[1]  Family History  Problem Relation Age of Onset   Hypertension Mother    Hyperlipidemia Mother    Hypercholesterolemia Mother    Diabetes Father    Thyroid  cancer Father    Lung cancer Father    Hypercholesterolemia Father    Hypertension Father    Rectal cancer Maternal Grandmother    Colon cancer Maternal Grandmother    Esophageal cancer Neg Hx    Stomach cancer Neg Hx      Prior to Admission medications  Medication Sig Start Date End Date Taking? Authorizing Provider  acetaminophen  (TYLENOL ) 500 MG tablet Take 500 mg by mouth every 6 (six) hours as needed for headache or moderate pain (pain score 4-6).    [provider]  allopurinol  (ZYLOPRIM ) 100 MG tablet Take 100 mg by mouth daily. 10/13/20   [provider]  ALPRAZolam  (XANAX ) 1 MG tablet Take 1 mg by mouth at bedtime. 05/31/15   [provider]  aspirin  EC 81 MG  tablet Take 1 tablet (81 mg total) by mouth 2 (two) times daily. 08/12/24 08/12/25  Henry Slater NOVAK, PA-C  carvedilol  (COREG ) 12.5 MG tablet Take 12.5 mg by mouth 2 (two) times daily. Holds if systolic BP is 110 07/08/24   [provider]  COZAAR  50 MG tablet Take 50 mg by mouth 2 (two) times daily. Hold for systolic BP of 110 09/10/23   [provider]  famotidine  (PEPCID ) 20 MG tablet Take 20 mg by  mouth daily.    [provider]  furosemide (LASIX) 40 MG tablet Take 40 mg by mouth daily as needed for fluid or edema.     [provider]  gabapentin  (NEURONTIN ) 300 MG capsule Take 300 mg by mouth at bedtime.    [provider]  hydrALAZINE  (APRESOLINE ) 25 MG tablet Take 1 tablet (25 mg total) by mouth 3 (three) times daily. 09/19/23 08/12/24  Carlin Delon BROCKS, NP  Magnesium  Oxide -Mg Supplement 250 MG TABS Take 400 mg by mouth 2 (two) times daily.    [provider]  meclizine  (ANTIVERT ) 25 MG tablet Take 25 mg by mouth 2 (two) times daily as needed for dizziness or nausea. 06/20/24   [provider]  medroxyPROGESTERone  (PROVERA ) 10 MG tablet Take 10 mg by mouth daily. 04/09/17   [provider]  nitroGLYCERIN  (NITROSTAT ) 0.4 MG SL tablet Place 1 tablet (0.4 mg total) under the tongue every 5 (five) minutes as needed for chest pain. 11/21/22   Revankar, Jennifer SAUNDERS, MD  ondansetron  (ZOFRAN -ODT) 4 MG disintegrating tablet Take 4 mg by mouth every 8 (eight) hours as needed. 06/24/24   [provider]  pantoprazole  (PROTONIX ) 40 MG tablet TAKE 1 TABLET(40 MG) BY MOUTH DAILY 03/08/23   Charlanne Groom, MD  promethazine  (PHENERGAN ) 12.5 MG tablet Take 12.5 mg by mouth every 6 (six) hours as needed for nausea or vomiting. Patient not taking: Reported on 08/06/2024    [provider]  Psyllium (METAMUCIL PO) Take 1 capsule by mouth daily.    [provider]  sertraline  (ZOLOFT ) 50 MG tablet Take 50 mg by mouth daily.     [provider]  [Paused] sevelamer  carbonate (RENVELA ) 800 MG tablet Take 800 mg by mouth 3 (three) times daily with meals. Wait to take this until your doctor or other care provider tells you to start again. 01/04/22   [provider]  simvastatin  (ZOCOR ) 5 MG tablet Take 5 mg by mouth at bedtime. 11/08/21   [provider]  traZODone  (DESYREL ) 100 MG tablet Take 100 mg by mouth at bedtime.    [provider]    Physical Exam: Vitals:   08/17/24 1321 08/17/24 1645 08/17/24 1935 08/17/24 2156  BP:  133/77 (!) 153/81   Pulse:  92 93   Resp:  19 16   Temp:  98.4 F (36.9 C)  98.7 F (37.1 C)  TempSrc:    Axillary  SpO2:  94% 99%   Weight: 77.3 kg        Constitutional: NAD, no pallor or diaphoresis   Eyes: PERTLA, lids and conjunctivae normal ENMT: Mucous membranes are moist. Posterior pharynx clear of any exudate or lesions.   Neck: supple, no masses  Respiratory: no wheezing, no crackles. No accessory muscle use.  Cardiovascular: S1 & S2 heard, regular rate and rhythm. No lower leg edema.  Abdomen: No tenderness, soft. Bowel sounds active.  Musculoskeletal: no clubbing / cyanosis. Left knee mildly edematous with clean and dry bandages, no significant erythema.   Skin: no significant rashes, lesions, ulcers. Warm, dry, well-perfused. Neurologic: CN 2-12 grossly intact. Moving all extremities. Alert and oriented to person and place.  Psychiatric: Labile mood. Intermittently agitated. Intermittently cooperative.    Labs and Imaging on Admission: I have personally reviewed following labs and imaging studies  CBC: Recent Labs  Lab 08/12/24 0727 08/17/24 1407  WBC  --  6.7  HGB 11.2* 9.2*  HCT 33.0* 29.6*  MCV  --  95.8  PLT  --  228   Basic Metabolic Panel: Recent Labs  Lab 08/12/24 0727 08/17/24 1407  NA 135 134*  K 5.0 4.9  CL 98 97*  CO2  --  22  GLUCOSE 89 73  BUN 26* 37*  CREATININE 7.30* 9.26*  CALCIUM  --  8.8*    GFR: Estimated Creatinine Clearance: 6.7 mL/min (A) (by C-G formula based on SCr of 9.26 mg/dL (H)). Liver Function Tests: Recent Labs  Lab 08/17/24 1407  AST 11*  ALT 9  ALKPHOS 56  BILITOT 1.4*  PROT 6.6  ALBUMIN 3.1*   No results for input(s): LIPASE, AMYLASE in the last 168 hours. No results for input(s): AMMONIA in the last 168 hours. Coagulation Profile: No results for input(s): INR, PROTIME in the last 168 hours. Cardiac Enzymes: No results for input(s): CKTOTAL, CKMB, CKMBINDEX, TROPONINI in the last 168 hours. BNP (last 3 results) No results for input(s): PROBNP in the last 8760 hours. HbA1C: No results for input(s): HGBA1C in the last 72 hours. CBG: Recent Labs  Lab 08/17/24 1933  GLUCAP 84   Lipid Profile: No results for input(s): CHOL, HDL, LDLCALC, TRIG, CHOLHDL, LDLDIRECT in the last 72 hours. Thyroid  Function Tests: No results for input(s): TSH, T4TOTAL, FREET4, T3FREE, THYROIDAB in the last 72 hours. Anemia Panel: No results for input(s): VITAMINB12, FOLATE, FERRITIN, TIBC, IRON, RETICCTPCT in the last 72 hours. Urine analysis:    Component Value Date/Time   COLORURINE YELLOW 08/17/2024 1521   APPEARANCEUR HAZY (A) 08/17/2024 1521   LABSPEC 1.009 08/17/2024 1521   PHURINE 8.0 08/17/2024 1521   GLUCOSEU 50 (A) 08/17/2024 1521   HGBUR SMALL (A) 08/17/2024 1521   BILIRUBINUR NEGATIVE 08/17/2024 1521   KETONESUR 5 (A) 08/17/2024 1521   PROTEINUR 100 (A) 08/17/2024 1521   UROBILINOGEN 0.2 08/08/2012 1420   NITRITE NEGATIVE 08/17/2024 1521   LEUKOCYTESUR TRACE (A) 08/17/2024 1521   Sepsis Labs: @LABRCNTIP (procalcitonin:4,lacticidven:4) ) Recent Results (from the past 240 hours)  Surgical pcr screen     Status: Abnormal   Collection Time: 08/10/24  3:06 PM   Specimen: Nasal Mucosa; Nasal Swab  Result Value Ref Range Status   MRSA, PCR POSITIVE (A) NEGATIVE Final    Comment: RESULT CALLED  TO, READ BACK BY AND VERIFIED WITH: emailed lisa hodgin on 08/10/24 @ 1728 by drt    Staphylococcus aureus POSITIVE (A) NEGATIVE Final    Comment: (NOTE) The Xpert SA Assay (FDA approved for NASAL specimens in patients 45 years of age and older), is one component of a comprehensive surveillance program. It is not intended to diagnose infection nor to guide or monitor treatment. Performed at [facility], [address], [place], [place] [address]      Radiological Exams on Admission: DG Chest Portable 1 View Result Date: 08/17/2024 EXAM: 1 VIEW(S) XRAY OF THE CHEST 08/17/2024 07:32:00 PM COMPARISON: None available. CLINICAL HISTORY: ams FINDINGS: LINES, TUBES AND DEVICES: Right subclavian vascular stent noted. LUNGS AND PLEURA: Left basilar airspace opacity. Small left pleural effusion. No pneumothorax. HEART AND MEDIASTINUM: Cardiomegaly. BONES AND SOFT TISSUES: Cervical fusion hardware noted. IMPRESSION: 1. Left basilar airspace opacity with a small left pleural effusion, which may reflect aspiration or pneumonia. 2. Cardiomegaly. Electronically signed by: Greig Pique MD 08/17/2024 07:39 PM EST RP Workstation: [id number]   CT HEAD WO CONTRAST Result Date: 08/17/2024 EXAM: CT HEAD WITHOUT 08/17/2024 02:46:34 PM TECHNIQUE: CT of the head was performed without the administration of intravenous contrast. Automated exposure  control, iterative reconstruction, and/or weight based adjustment of the mA/kV was utilized to reduce the radiation dose to as low as reasonably achievable. COMPARISON: MRI brain 01/09/2020. CLINICAL HISTORY: Memory loss; Mental status change, unknown cause. FINDINGS: BRAIN AND VENTRICLES: No acute intracranial hemorrhage. No mass effect or midline shift. No extra-axial fluid collection. No evidence of acute infarct. No hydrocephalus. ORBITS: No acute abnormality. SINUSES AND MASTOIDS: Left maxillary sinus opacification. Extension through medial left maxillary sinus wall,  suggestive of mucocele. SOFT TISSUES AND SKULL: No acute skull fracture. No acute soft tissue abnormality. IMPRESSION: 1. No acute intracranial abnormality. 2. Left maxillary sinus opacification with extension through the medial left maxillary sinus wall, suggestive of a mucocele. Electronically signed by: Ryan Chess MD 08/17/2024 02:57 PM EST RP Workstation: [id number]    EKG: Independently reviewed. Sinus rhythm.   Assessment/Plan   1. Acute encephalopathy  - No acute findings on head CT in ED    - Check ammonia, TSH, thiamine, B12, VBG, and RPR, hold Flexeril, gabapentin , oxycodone , Xanax , Zoloft , and trazodone  for now, treat pneumonia, use delirium precautions    2. Suicidal ideation  - Patient reported suicidal ideation to nursing in the ED but denies that now  - Suicide precautions for now   3. Pneumonia  - Family reports that she has been coughing and CXR concerning for possible pneumonia  - She was started on Unasyn  in ED, will add doxycycline      4. ESRD  - Did not complete HD on day of admission due to AMS  - Restrict fluids, renally-dose medications   5. Depression, anxiety  - Zoloft , trazodone , and Xanax  held on admission   6. Hypertension  - Coreg , losartan , hydralazine    DVT prophylaxis: sq heparin   Code Status: Full   Level of Care: Level of care: Telemetry Family Communication:  Updated from ED  Disposition Plan:  Patient is from: Home  Anticipated d/c is to: TBD Anticipated d/c date is: Possibly as early as 08/18/24  Patient currently: Pending improved mental status  Consults called: None  Admission status: Observation     Evalene GORMAN Sprinkles, MD [facility]  08/17/2024, 10:16 PM       [1]  Allergies Allergen Reactions   Bactrim [Sulfamethoxazole-Trimethoprim] Anaphylaxis   Sulfamethoxazole Anaphylaxis   Ambien [Zolpidem]     Sleep walking, hallucinations   Hydrogen Peroxide Rash   Labetalol  Hives and Rash   Wound Dressing Adhesive  Hives   Ace Inhibitors Other (See Comments)    Due to Kidney Transplant   Amlodipine Swelling   Hyoscyamine Other (See Comments)    Dizziness    Keflex [Cephalexin] Hives and Swelling    Per pt, her face and lips swell up and she develops hives   Orphenadrine Hives   Adhesive [Tape] Rash    Can not use for long periods of time (3 days or more)   Imuran [Azathioprine Sodium] Rash   Neosporin [Bacitracin-Polymyxin B] Rash and Other (See Comments)    Can use that for awhile, but will eventually develop a rash    Tramadol Rash   Warfarin Sodium  Rash

## 2024-08-18 ENCOUNTER — Observation Stay (HOSPITAL_COMMUNITY)

## 2024-08-18 DIAGNOSIS — N186 End stage renal disease: Secondary | ICD-10-CM | POA: Diagnosis present

## 2024-08-18 DIAGNOSIS — R45851 Suicidal ideations: Secondary | ICD-10-CM | POA: Diagnosis present

## 2024-08-18 DIAGNOSIS — R569 Unspecified convulsions: Secondary | ICD-10-CM | POA: Diagnosis not present

## 2024-08-18 DIAGNOSIS — E039 Hypothyroidism, unspecified: Secondary | ICD-10-CM | POA: Diagnosis present

## 2024-08-18 DIAGNOSIS — F32A Depression, unspecified: Secondary | ICD-10-CM | POA: Diagnosis present

## 2024-08-18 DIAGNOSIS — Z8249 Family history of ischemic heart disease and other diseases of the circulatory system: Secondary | ICD-10-CM | POA: Diagnosis not present

## 2024-08-18 DIAGNOSIS — E7800 Pure hypercholesterolemia, unspecified: Secondary | ICD-10-CM | POA: Diagnosis present

## 2024-08-18 DIAGNOSIS — Z79621 Long term (current) use of calcineurin inhibitor: Secondary | ICD-10-CM | POA: Diagnosis not present

## 2024-08-18 DIAGNOSIS — Z881 Allergy status to other antibiotic agents status: Secondary | ICD-10-CM | POA: Diagnosis not present

## 2024-08-18 DIAGNOSIS — G9341 Metabolic encephalopathy: Secondary | ICD-10-CM | POA: Diagnosis present

## 2024-08-18 DIAGNOSIS — G9340 Encephalopathy, unspecified: Secondary | ICD-10-CM | POA: Diagnosis not present

## 2024-08-18 DIAGNOSIS — I120 Hypertensive chronic kidney disease with stage 5 chronic kidney disease or end stage renal disease: Secondary | ICD-10-CM | POA: Diagnosis present

## 2024-08-18 DIAGNOSIS — K760 Fatty (change of) liver, not elsewhere classified: Secondary | ICD-10-CM | POA: Diagnosis present

## 2024-08-18 DIAGNOSIS — Z6834 Body mass index (BMI) 34.0-34.9, adult: Secondary | ICD-10-CM | POA: Diagnosis not present

## 2024-08-18 DIAGNOSIS — D62 Acute posthemorrhagic anemia: Secondary | ICD-10-CM | POA: Diagnosis not present

## 2024-08-18 DIAGNOSIS — Z992 Dependence on renal dialysis: Secondary | ICD-10-CM | POA: Diagnosis not present

## 2024-08-18 DIAGNOSIS — Z85828 Personal history of other malignant neoplasm of skin: Secondary | ICD-10-CM | POA: Diagnosis not present

## 2024-08-18 DIAGNOSIS — Z940 Kidney transplant status: Secondary | ICD-10-CM | POA: Diagnosis not present

## 2024-08-18 DIAGNOSIS — D631 Anemia in chronic kidney disease: Secondary | ICD-10-CM | POA: Diagnosis present

## 2024-08-18 DIAGNOSIS — Z833 Family history of diabetes mellitus: Secondary | ICD-10-CM | POA: Diagnosis not present

## 2024-08-18 DIAGNOSIS — J690 Pneumonitis due to inhalation of food and vomit: Secondary | ICD-10-CM | POA: Diagnosis present

## 2024-08-18 DIAGNOSIS — Z882 Allergy status to sulfonamides status: Secondary | ICD-10-CM | POA: Diagnosis not present

## 2024-08-18 DIAGNOSIS — E66811 Obesity, class 1: Secondary | ICD-10-CM | POA: Diagnosis present

## 2024-08-18 DIAGNOSIS — Z1152 Encounter for screening for COVID-19: Secondary | ICD-10-CM | POA: Diagnosis not present

## 2024-08-18 DIAGNOSIS — G928 Other toxic encephalopathy: Secondary | ICD-10-CM | POA: Diagnosis present

## 2024-08-18 DIAGNOSIS — N2581 Secondary hyperparathyroidism of renal origin: Secondary | ICD-10-CM | POA: Diagnosis present

## 2024-08-18 DIAGNOSIS — R4182 Altered mental status, unspecified: Secondary | ICD-10-CM | POA: Diagnosis not present

## 2024-08-18 DIAGNOSIS — R410 Disorientation, unspecified: Secondary | ICD-10-CM | POA: Diagnosis present

## 2024-08-18 LAB — RESP PANEL BY RT-PCR (RSV, FLU A&B, COVID)  RVPGX2
Influenza A by PCR: NEGATIVE
Influenza B by PCR: NEGATIVE
Resp Syncytial Virus by PCR: NEGATIVE
SARS Coronavirus 2 by RT PCR: NEGATIVE

## 2024-08-18 LAB — BASIC METABOLIC PANEL WITH GFR
Anion gap: 16 — ABNORMAL HIGH (ref 5–15)
BUN: 44 mg/dL — ABNORMAL HIGH (ref 6–20)
CO2: 21 mmol/L — ABNORMAL LOW (ref 22–32)
Calcium: 8.6 mg/dL — ABNORMAL LOW (ref 8.9–10.3)
Chloride: 99 mmol/L (ref 98–111)
Creatinine, Ser: 10.41 mg/dL — ABNORMAL HIGH (ref 0.44–1.00)
GFR, Estimated: 4 mL/min — ABNORMAL LOW (ref >60)
Glucose, Bld: 90 mg/dL (ref 70–99)
Potassium: 4.9 mmol/L (ref 3.5–5.1)
Sodium: 136 mmol/L (ref 135–145)

## 2024-08-18 LAB — AMMONIA: Ammonia: 19 umol/L (ref 9–35)

## 2024-08-18 LAB — CBC
HCT: 26.4 % — ABNORMAL LOW (ref 36.0–46.0)
Hemoglobin: 8.4 g/dL — ABNORMAL LOW (ref 12.0–15.0)
MCH: 30.4 pg (ref 26.0–34.0)
MCHC: 31.8 g/dL (ref 30.0–36.0)
MCV: 95.7 fL (ref 80.0–100.0)
Platelets: 223 K/uL (ref 150–400)
RBC: 2.76 MIL/uL — ABNORMAL LOW (ref 3.87–5.11)
RDW: 15.5 % (ref 11.5–15.5)
WBC: 5.5 K/uL (ref 4.0–10.5)
nRBC: 0 % (ref 0.0–0.2)

## 2024-08-18 LAB — PROCALCITONIN
Procalcitonin: 1.98 ng/mL
Procalcitonin: 1.74 ng/mL

## 2024-08-18 LAB — HEPATITIS B SURFACE ANTIGEN: Hepatitis B Surface Ag: NONREACTIVE

## 2024-08-18 LAB — HIV ANTIBODY (ROUTINE TESTING W REFLEX): HIV Screen 4th Generation wRfx: NONREACTIVE

## 2024-08-18 LAB — T4, FREE: Free T4: 0.77 ng/dL (ref 0.61–1.12)

## 2024-08-18 LAB — TSH: TSH: 4.044 u[IU]/mL (ref 0.350–4.500)

## 2024-08-18 LAB — VITAMIN B12: Vitamin B-12: >4000 pg/mL — ABNORMAL HIGH (ref 180–914)

## 2024-08-18 LAB — SYPHILIS: RPR W/REFLEX TO RPR TITER AND TREPONEMAL ANTIBODIES, TRADITIONAL SCREENING AND DIAGNOSIS ALGORITHM: RPR Ser Ql: NONREACTIVE

## 2024-08-18 MED ORDER — LORAZEPAM 0.5 MG PO TABS: 0.5000 mg | ORAL_TABLET | Freq: Once | ORAL | Status: DC | PRN

## 2024-08-18 MED ORDER — SERTRALINE HCL 25 MG PO TABS
50.0000 mg | ORAL_TABLET | Freq: Every day | ORAL | Status: DC
  Administered 2024-08-18 – 2024-08-19 (×2): 50 mg via ORAL
  Filled 2024-08-18: qty 2
  Filled 2024-08-18: qty 1

## 2024-08-18 MED ORDER — ALPRAZOLAM 0.5 MG PO TABS
0.5000 mg | ORAL_TABLET | Freq: Every evening | ORAL | Status: DC | PRN
  Administered 2024-08-18: 0.5 mg via ORAL
  Filled 2024-08-18: qty 1

## 2024-08-18 MED ORDER — CHLORHEXIDINE GLUCONATE CLOTH 2 % EX PADS
6.0000 | MEDICATED_PAD | Freq: Every day | CUTANEOUS | Status: DC
  Administered 2024-08-19: 06:00:00 6 via TOPICAL

## 2024-08-18 MED ORDER — POLYETHYLENE GLYCOL 3350 17 G PO PACK
17.0000 g | PACK | Freq: Every day | ORAL | Status: DC
  Administered 2024-08-18 – 2024-08-19 (×2): 17 g via ORAL
  Filled 2024-08-18 (×2): qty 1

## 2024-08-18 NOTE — Progress Notes (Signed)
°  Progress Note   Patient: Ariel [name] FMW:[id number] DOB: 12-14-1975 DOA: 08/17/2024     0 DOS: the patient was seen and examined on 08/18/2024 at 8:01AM      Brief hospital course: 48 y.o. 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     Assessment and Plan: Acute metabolic encephalopathy Admitted with confusion, no clear focal deficits.  Appears to be polypharmacy and pneumonia.    CT head normal.  Ammonia, TSH, RPR, B12 unremarkable.   - Hold Xanax , gabapentin , oxycodone , Robaxin, trazodone  -Delirium precautions  Pneumonia, left lower lobe CXR shows LLL infiltrates - Continue Unasyn  and doxycycline   Anemia Likely acute postoperative anemia superimposed on anemia of CKD.  No bleeding observed or reported - Trend Hgb  ESRD -Consult nephrology for routine HD  Hypertension BP normal -Continue carvedilol , losartan , hydralazine   Hyperlipidemia - Continue simvastatin   Hypothyroidism TSH normal - Continue levothyroxine  Class 1 obesity BMI 34, complicates care  MASLD Had FibroScan recently that showed severe steatosis, fibrosis, but follow-up liver biopsy showed grade 1 of 4 fibrosis only.  Ammonia here normal.  Recent knee replacement From my review of notes, she was prescribed oxycodone  at discharge, no DVT ppx.  Anal intraepithelial neoplasm History vulvar epithelial neoplasm s/p partial vulvectomy Follows with Dr. Ezzard and Dr. Debby, in annual monitoring   Hx SVT  Depression - Continue sertraline        Subjective: Patient remains confused.  She has had no fever overnight, no respiratory distress, no seizures.     Physical Exam: BP 125/71   Pulse 87   Temp 98.6 F (37 C) (Oral)   Resp 17   Wt 77.3 kg   LMP 09/13/2013   SpO2 95%   BMI 34.42 kg/m   Adult female, lying in  bed, responsive to questions, but disoriented RRR, long-standing systolic murmur, no peripheral edema Respiratory rate normal, lungs clear without rales or wheezes Abdomen soft, no tenderness palpation, no ascites or distention She has the TV on, but she is not watching, she has psychomotor slowing, face is symmetric, upper extremity strength normal, asterixis noted, oriented to self and hospital only.     Data Reviewed: Discussed with nephrology Basic metabolic panel shows elevated creatinine consistent with ESRD, mild acidosis, normal sodium and potassium CBC shows no leukocytosis, mild anemia, platelets normal CT head normal      Family Communication: mother by phone    Disposition: Remains confused and will require ongoing treatment with IV antibiotics         Author: Lonni SHAUNNA Dalton, MD 08/18/2024 12:35 PM  For on call review [email or web address].

## 2024-08-18 NOTE — Progress Notes (Signed)
 EEG complete - results pending

## 2024-08-18 NOTE — ED Notes (Signed)
 CCMD called.

## 2024-08-18 NOTE — ED Notes (Signed)
 Assisted patient to the bathroom. Pt able to stand and pivot. Utilized a wheelchair to transport patient to the bathroom.

## 2024-08-18 NOTE — Procedures (Signed)
 Patient Name: Ariel [name]  MRN: [id number]  Epilepsy Attending: Arlin MALVA Krebs  Referring Physician/Provider: Jonel Lonni SQUIBB, MD  Date: 08/18/2024 Duration: 23.12 mins  Patient history: 48yo F with ams. EEG to evaluate for seizure  Level of alertness: Awake  AEDs during EEG study: None  Technical aspects: This EEG study was done with scalp electrodes positioned according to the 10-20 International system of electrode placement. Electrical activity was reviewed with band pass filter of 1-70Hz , sensitivity of 7 uV/mm, display speed of 81mm/sec with a 60Hz  notched filter applied as appropriate. EEG data were recorded continuously and digitally stored.  Video monitoring was available and reviewed as appropriate.  Description: EEG showed continuous generalized 3 to 6 Hz theta-delta slowing. Physiologic photic driving was not seen during photic stimulation. Hyperventilation was not performed.     ABNORMALITY - Continuous slow, generalized  IMPRESSION: This study is suggestive of  generalized cerebral dysfunction ( encephalopathy). No seizures or epileptiform discharges were seen throughout the recording.   Denesha Brouse O Kaiyon Hynes

## 2024-08-18 NOTE — Consult Note (Signed)
 Renal Service Consult Note [facility] Lamar JONETTA Fret, MD  Patient: Ariel [name] Date: 08/18/2024 Requesting Physician: Dr. Jonel  Reason for Consult: ESRD pt w/ altered mental status HPI: The patient is a 48 y.o. year-old w/ PMH as below who presented to ED w/ AMS. Hx of L knee surgery on 08/12/24, staying w/ her mother since then. Family says the has been confused for the past 3-4 days. In ED afeb, SpO2 90s, normal HR, RR and stable BP. Na 134, K+ 4.9, BUN 37, creat 9.2, Ca 8.8, alb 3.1, WBC 6K, Hb 9.2. UA showed rare bact, 0-5 rbc/ wbc, 11-20 epis. CXR showed LLL infiltrates. Pt admitted for PNA and started on IV abx. Today MS is much better and thought to have been due to PNA and polypharmacy. We are asked to see for dialysis.    Pt seen in room. In good spirits, no SOB, CP, abd pain, no n/vd, no f/c/s.   She doesn't remember if she got HD on Monday , and if so, where.   ROS - denies CP, no joint pain, no HA, no blurry vision, no rash, no diarrhea, no nausea/ vomiting   Past Medical History  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   AKI (acute kidney injury) 08/18/2021   Allergy, unspecified, initial encounter 08/28/2022   Anaphylactic shock, unspecified, initial encounter 08/28/2022   Anemia associated with chronic renal failure    Anemia in chronic kidney disease 08/21/2022   Anorectal disorder 06/08/2020   Anorectal pain 06/08/2020   Anxiety disorder 10/29/2018   Back pain    Blind right eye    Can not see straight in front of her. R eye   Bloating 04/16/2018   Breakdown (mechanical) of unspecified cardiac and vascular devices and implants, subsequent encounter 10/12/2022   Cancer (HCC)    pre cervical   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    of the [place]   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 09/05/2022   Carcinoma in  situ of vulva 09/05/2022   pre cancerous   Chest pain of uncertain etiology 05/09/2022   Chronic kidney disease, stage V (HCC) 11/03/2021   Complications due to cardiac device, implant, and graft 09/02/2012   Condyloma of female genitalia    Dehydration 01/19/2019   Dependence on renal dialysis 08/21/2022   Depression    Dialysis patient    not currently as of 07/02/22   Drug-induced bleeding disorder 01/19/2019   DVT of axillary vein, acute left (HCC) 08/02/2012   Dysmenorrhea 10/19/2015   Dyspnea on exertion 01/19/2019   Dysuria    End stage renal disease (HCC) 09/02/2012   End stage renal disease on dialysis (HCC) 11/05/2022   ESRD (end stage renal disease) (HCC)    sp transplant x 2, did mix of HD and PD from 2001- 2008   Essential hypertension 11/03/2021   Essential hypertension with goal blood pressure less than 130/80    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   Fatigue    Gastritis, acute    GERD without esophagitis 12/20/2021   Gout    Gout due to renal impairment, unspecified site 08/21/2022   Headache    migraines   History of parathyroidectomy 10/29/2018   History of renal transplant    HTN (hypertension) 01/19/2019   Hypercholesterolemia    Hyperlipidemia    Hyperphosphatemia  12/20/2021   Hypertensive chronic kidney disease with stage 1 through stage 4 chronic kidney disease, or unspecified chronic kidney disease 08/21/2022   Hypokalemia    Hypomagnesemia    Hypothyroid 01/19/2019   IBS (irritable bowel syndrome)    Immunosuppressive management encounter following kidney transplant 10/29/2018   Internal hemorrhoid 04/22/2018   Iron deficiency anemia 10/19/2015   Irregular uterine bleeding 02/13/2016   Kidney disorder 06/08/2020   Kidney transplant recipient 10/29/2018   1st transplant [facility]) was 1995- 2001.  Did HD/ PD mix from 2001- 2008. 2nd transplant ([facility]/ [facility]) was in 2008 and is still working as of 12/2021. F/b Dr Gearline ([place]) and [facility]  transplant team.   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   Nausea and vomiting 01/19/2019   Nausea and vomiting 01/19/2019   Nephrogenic diabetes insipidus    congenital   Obesity, Class I, BMI 30-34.9 12/21/2021   Other chronic sinusitis 10/29/2018   Other fluid overload 10/12/2022   Pain, unspecified 08/28/2022   Palpitations    Pancreatic duct dilated 02/15/2022   Pancreatitis    Perianal lesion 04/22/2018   Phlebitis and thrombophlebitis of other sites 08/02/2012   Plantar fasciitis 07/03/2018   Pre-transplant evaluation for kidney transplant 01/09/2022   Preop cardiovascular exam 05/09/2022   Pruritus ani 06/26/2018   Renal failure, chronic 11/03/2021   Dialysis in the past   Right lower quadrant abdominal pain 10/29/2018   [facility] spotted fever    S/p cadaver renal transplant 08/02/2012   Secondary hyperparathyroidism of renal origin    Secondary hypertension due to renal disease 10/29/2018   Sesamoiditis 07/03/2018   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    Transplanted organ removal status 10/29/2018   Unstable angina (HCC) 01/19/2019   Urinary tract infection    Vision loss 06/15/2022   Vulvar dystrophy    Yeast dermatitis 08/14/2018   Past Surgical History  Past Surgical History:  Procedure Laterality Date   A/V FISTULAGRAM Right 06/11/2024   Procedure: A/V Fistulagram;  Surgeon: Sheree Penne Bruckner, MD;  Location: [facility];  Service: Cardiovascular;  Laterality: Right;   AV FISTULA PLACEMENT Right    AV FISTULA  PLACEMENT Right 05/17/2022   Procedure: RIGHT ARM BRACHIOCEPHALIC ARTERIOVENOUS (AV) FISTULA CREATION;  Surgeon: Lanis Fonda BRAVO, MD;  Location: [facility] OR;  Service: Vascular;  Laterality: Right;   COLONOSCOPY  11/28/2005   [facility]   COLONOSCOPY  04/13/2016   Dr Anette   CORONARY PRESSURE/FFR STUDY N/A 11/29/2022   Procedure: INTRAVASCULAR PRESSURE WIRE/FFR STUDY;  Surgeon: Wendel Lurena POUR, MD;  Location: [facility];  Service: Cardiovascular;  Laterality: N/A;   drain tube placed     Abdomen for abscess   ESOPHAGOGASTRODUODENOSCOPY  07/31/2017   Distal esophageal ulcers (likely due to gastroeesphageal reflix-biopsied). Small hiatal hernia. Erosive gastritis.   FISTULA SUPERFICIALIZATION Right 07/17/2022   Procedure: SUPERFICIALIZATION AND REVISION RIGHT ARM FISTULA;  Surgeon: Lanis Fonda BRAVO, MD;  Location: [facility] OR;  Service: Vascular;  Laterality: Right;   HEMORRHOID SURGERY  2019, and 04/29/2020   INSERTION OF DIALYSIS CATHETER     KIDNEY TRANSPLANT  1995, 2008   KNEE SURGERY Left 2020   LEFT HEART CATH AND CORONARY ANGIOGRAPHY N/A 11/29/2022   Procedure: LEFT HEART CATH AND CORONARY ANGIOGRAPHY;  Surgeon: Wendel Lurena POUR, MD;  Location: [facility];  Service: Cardiovascular;  Laterality: N/A;   LIVER BIOPSY     May 2025   NECK SURGERY     bone spurs and surgery for pinched nerve March 2025   PARATHYROIDECTOMY  2004   Portion on the parathyroid  gland transplanted in R forearm   PARTIAL KNEE ARTHROPLASTY Left 08/12/2024   Procedure: ARTHROPLASTY, KNEE, UNICOMPARTMENTAL;  Surgeon: Rubie Kemps, MD;  Location: [facility] OR;  Service: Orthopedics;  Laterality: Left;   REMOVAL OF A DIALYSIS CATHETER     SIMPLE VULVECTOMY  06/19/2011   Partial simple left   SKIN CANCER EXCISION  2016   Per pt, area removed on scalp showed squamous cell carcinoma   TUBAL LIGATION     UPPER GASTROINTESTINAL ENDOSCOPY     VENOUS ANGIOPLASTY  06/11/2024   Procedure: VENOUS ANGIOPLASTY;   Surgeon: Sheree Penne Bruckner, MD;  Location: [facility];  Service: Cardiovascular;;  Cephalic Arch; Innominate Vein   VENOUS STENT Right 06/11/2024   Procedure: VENOUS STENT;  Surgeon: Sheree Penne Bruckner, MD;  Location: [facility];  Service: Cardiovascular;  Laterality: Right;  9x5 Viabahn   WISDOM TOOTH EXTRACTION     Family History  Family History  Problem Relation Age of Onset   Hypertension Mother    Hyperlipidemia Mother    Hypercholesterolemia Mother    Diabetes Father    Thyroid  cancer Father    Lung cancer Father    Hypercholesterolemia Father    Hypertension Father    Rectal cancer Maternal Grandmother    Colon cancer Maternal Grandmother    Esophageal cancer Neg Hx    Stomach cancer Neg Hx    Social History  reports that she has never smoked. She has never been exposed to tobacco smoke. She has never used smokeless tobacco. She reports that she does not drink alcohol and does not use drugs. Allergies Allergies[1] Home medications Prior to Admission medications  Medication Sig Start Date End Date Taking? Authorizing Provider  allopurinol  (ZYLOPRIM ) 100 MG tablet Take 100 mg by mouth daily. 10/13/20  Yes [provider]  ALPRAZolam  (XANAX ) 1 MG tablet Take 1 mg by mouth at bedtime. 05/31/15  Yes [provider]  amLODipine (NORVASC) 10 MG tablet Take 10 mg by mouth daily.   Yes [provider]  carvedilol  (COREG ) 12.5 MG tablet Take 12.5 mg by mouth 2 (two) times daily. Holds if systolic BP is 110 07/08/24  Yes [provider]  COZAAR  50 MG tablet Take 50 mg by mouth 2 (two) times daily. Hold for systolic BP of 110 09/10/23  Yes [provider]  docusate sodium (COLACE) 100 MG capsule Take 100 mg by mouth 2 (two) times daily as needed for mild constipation or moderate constipation. 08/11/24  Yes [provider]  famotidine  (PEPCID ) 20 MG tablet Take 20 mg by mouth daily.   Yes [provider]  furosemide  (LASIX) 80 MG tablet Take 80 mg by mouth 3 (three) times a week. THS - after dialysis   Yes [provider]  gabapentin  (NEURONTIN ) 300 MG capsule Take 300 mg by mouth at bedtime.   Yes [provider]  hydrALAZINE  (APRESOLINE ) 25 MG tablet  Take 1 tablet (25 mg total) by mouth 3 (three) times daily. 09/19/23 08/18/24 Yes Carlin Delon BROCKS, NP  Magnesium  Oxide (HM MAGNESIUM  PO) Take 1 capsule by mouth daily at 12 noon.   Yes [provider]  meclizine  (ANTIVERT ) 25 MG tablet Take 25 mg by mouth 2 (two) times daily as needed for dizziness or nausea. 06/20/24  Yes [provider]  medroxyPROGESTERone  (PROVERA ) 10 MG tablet Take 10 mg by mouth daily. 04/09/17  Yes [provider]  methocarbamol (ROBAXIN) 500 MG tablet Take 500 mg by mouth every 6 (six) hours as needed. 08/11/24  Yes [provider]  mupirocin ointment (BACTROBAN) 2 % Apply 1 Application topically 2 (two) times daily. 08/12/24  Yes [provider]  nitroGLYCERIN  (NITROSTAT ) 0.4 MG SL tablet Place 1 tablet (0.4 mg total) under the tongue every 5 (five) minutes as needed for chest pain. 11/21/22  Yes Revankar, Jennifer SAUNDERS, MD  ondansetron  (ZOFRAN -ODT) 4 MG disintegrating tablet Take 4 mg by mouth every 8 (eight) hours as needed for nausea, refractory nausea / vomiting or vomiting. 06/24/24  Yes [provider]  oxyCODONE  (OXY IR/ROXICODONE ) 5 MG immediate release tablet Take 5-10 mg by mouth every 4 (four) hours as needed. 08/11/24  Yes [provider]  pantoprazole  (PROTONIX ) 40 MG tablet TAKE 1 TABLET(40 MG) BY MOUTH DAILY 03/08/23  Yes Charlanne Groom, MD  sertraline  (ZOLOFT ) 50 MG tablet Take 50 mg by mouth at bedtime.   Yes [provider]  simvastatin  (ZOCOR ) 5 MG tablet Take 5 mg by mouth at bedtime. 11/08/21  Yes [provider]  traZODone  (DESYREL ) 100 MG tablet Take 100 mg by mouth at bedtime.   Yes [provider]  acetaminophen  (TYLENOL )  500 MG tablet Take 500 mg by mouth every 6 (six) hours as needed for headache or moderate pain (pain score 4-6).    [provider]  aspirin  EC 81 MG tablet Take 1 tablet (81 mg total) by mouth 2 (two) times daily. 08/12/24 08/12/25  Henry Slater NOVAK, PA-C  Psyllium (METAMUCIL PO) Take 1 capsule by mouth daily.    [provider]  [Paused] sevelamer  carbonate (RENVELA ) 800 MG tablet Take 800 mg by mouth 3 (three) times daily with meals. Wait to take this until your doctor or other care provider tells you to start again. 01/04/22   [provider]     Vitals:   08/18/24 1121 08/18/24 1314 08/18/24 1317 08/18/24 1533  BP: 125/71  138/80 131/69  Pulse:   70 86  Resp:   17 18  Temp:  98.2 F (36.8 C) 98.2 F (36.8 C) 99.1 F (37.3 C)  TempSrc:  Oral Oral   SpO2:   100% 97%  Weight:       Exam Gen alert, no distress, pleasant  Sclera anicteric, throat clear  No jvd or bruits Chest clear bilat to bases RRR no MRG Abd soft ntnd no mass or ascites +bs Ext no LE or UE edema, no other edema Neuro is alert, Ox 3 , nf    R AVF+bruit   Home bp meds: Norvasc Coreg  Losartan   Hydralazine   Lasix    OP HD: MWF HD From June 2025 -> edw 72.5kg AVF 3.5h  B350  Hep 2000    Assessment/ Plan: Altered mental status: due to PNA and polypharmacy, much better today.  ESRD: on HD MWF. HD tomorrow.  HTN: bp's stable, 130/80.  Volume: euvolemic on exam, UF 2-2.5 L Anemia of esrd:  Hb 9-11 , follow.        Myer Fret  MD [facility] 08/18/2024, 7:00 PM  Recent Labs  Lab 08/17/24 1407 08/17/24 2349 08/18/24 0534  HGB 9.2* 11.2* 8.4*  ALBUMIN 3.1*  --   --   CALCIUM 8.8*  --  8.6*  CREATININE 9.26*  --  10.41*  K 4.9 4.8 4.9   Inpatient medications:  allopurinol   100 mg Oral Daily   aspirin  EC  81 mg Oral BID   carvedilol   12.5 mg Oral BID   heparin   5,000 Units Subcutaneous Q8H   hydrALAZINE   25 mg Oral TID   losartan   50 mg Oral BID   pantoprazole   40 mg  Oral Daily   sertraline   50 mg Oral Daily   simvastatin   5 mg Oral QHS   sodium chloride  flush  3 mL Intravenous Q12H    ampicillin -sulbactam (UNASYN ) IV     doxycycline  (VIBRAMYCIN ) IV Stopped (08/18/24 1339)   acetaminophen , ALPRAZolam , haloperidol  lactate, prochlorperazine , senna      [1]  Allergies Allergen Reactions   Sulfamethoxazole Anaphylaxis   Ace Inhibitors Other (See Comments)    Due to Kidney Transplant   Ambien [Zolpidem] Other (See Comments)    Sleep walking, hallucinations   Amlodipine Swelling   Hydrogen Peroxide Rash   Keflex [Cephalexin] Hives and Swelling    Per pt, her face and lips swell up and she develops hives   Labetalol  Hives and Rash   Orphenadrine Hives   Wound Dressing Adhesive Hives   Adhesive [Tape] Rash    Can not use for long periods of time (3 days or more)   Hyoscyamine Other (See Comments)    Dizziness    Imuran [Azathioprine Sodium] Rash   Neosporin [Bacitracin-Polymyxin B] Rash and Other (See Comments)    Can use that for awhile, but will eventually develop a rash    Tramadol Rash   Warfarin Sodium  Rash

## 2024-08-18 NOTE — Hospital Course (Signed)
 48 y.o. 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

## 2024-08-18 NOTE — TOC Initial Note (Signed)
 Transition of Care [facility]) - Initial/Assessment Note    Patient Details  Name: Ariel [name] MRN: [id number] Date of Birth: 09/16/1975  [facility]) CM/SW Contact:    Marval Gell, RN Phone Number: 08/18/2024, 4:31 PM  Clinical Narrative:                  Per chart review, patient with recent knee surgery and staying with mother. Brought in with AMS, likely polypharmacy and PNA. Anticipate return to home when mentation clears.  ICM will continue to follow    Expected Discharge Plan: Home/Self Care Barriers to Discharge: Continued Medical Work up   Patient Goals and CMS Choice            Expected Discharge Plan and Services                                              Prior Living Arrangements/Services                       Activities of Daily Living      Permission Sought/Granted                  Emotional Assessment              Admission diagnosis:  Delirium [R41.0] Acute encephalopathy [G93.40] Aspiration pneumonia, unspecified aspiration pneumonia type, unspecified laterality, unspecified part of lung (HCC) [J69.0] Patient Active Problem List   Diagnosis Date Noted   Acute encephalopathy 08/17/2024   Pneumonia 08/17/2024   Suicidal ideations 08/17/2024   Headache 05/09/2023   Unspecified severe protein-calorie malnutrition 11/27/2022   Angina pectoris 11/21/2022   End stage renal disease on dialysis (HCC) 11/05/2022   Breakdown (mechanical) of unspecified cardiac and vascular devices and implants, subsequent encounter 10/12/2022   Other fluid overload 10/12/2022   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 09/05/2022   Carcinoma in situ of vulva 09/05/2022   Allergy, unspecified, initial encounter 08/28/2022   Anaphylactic shock, unspecified, initial encounter 08/28/2022   Pain, unspecified 08/28/2022   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) 08/22/2022   Gout due  to renal impairment, unspecified site 08/21/2022   Long term (current) use of calcineurin inhibitor 08/21/2022   Long term (current) use of inhibitors of nucleotide synthesis 08/21/2022   Anemia in chronic kidney disease 08/21/2022   Hypertensive chronic kidney disease with stage 1 through stage 4 chronic kidney disease, or unspecified chronic kidney disease 08/21/2022   Dependence on renal dialysis 08/21/2022   NAION (non-arteritic anterior ischemic optic neuropathy), 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   Vision loss 06/15/2022   Chest pain of uncertain etiology 05/09/2022   Preop cardiovascular exam 05/09/2022   Hyperlipidemia 05/08/2022   Essential hypertension with goal blood pressure less than 130/80 05/08/2022   Pancreatic duct dilated 02/15/2022   Abnormal CT scan, gastrointestinal tract    Abdominal pain 02/14/2022   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   Pre-transplant evaluation for kidney transplant 01/09/2022   Obesity, Class I, BMI 30-34.9 12/21/2021   GERD without esophagitis 12/20/2021   Hyperphosphatemia 12/20/2021   Anemia associated with chronic renal failure 11/03/2021   Back pain 11/03/2021   Cancer (HCC) 11/03/2021   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 11/03/2021   Chronic kidney disease, stage V (HCC) 11/03/2021  Condyloma of female genitalia 11/03/2021   Depression 11/03/2021   Dialysis patient 11/03/2021   Dysuria 11/03/2021   ESRD (end stage renal disease) (HCC) 11/03/2021   Essential hypertension 11/03/2021   Fatigue 11/03/2021   Gastritis, acute 11/03/2021   Gout 11/03/2021   History of renal transplant 11/03/2021   Hypercholesterolemia 11/03/2021   Hypokalemia 11/03/2021   Hypomagnesemia 11/03/2021   IBS (irritable bowel syndrome) 11/03/2021   Leg edema 11/03/2021   Metabolic acidosis 11/03/2021   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   Palpitations 11/03/2021   Pancreatitis 11/03/2021   Renal failure, chronic 11/03/2021   [facility] spotted fever 11/03/2021   Secondary hyperparathyroidism of renal origin 11/03/2021   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 11/03/2021   Urinary tract infection 11/03/2021   Vulvar dystrophy 11/03/2021   AKI (acute kidney injury) 08/18/2021   Anorectal disorder 06/08/2020   Anorectal pain 06/08/2020   Kidney disorder 06/08/2020   AIN grade III 04/05/2020   Nausea and vomiting 01/19/2019   Anemia 01/19/2019   Drug-induced bleeding disorder 01/19/2019   Dyspnea on exertion 01/19/2019   HTN (hypertension) 01/19/2019   Hypothyroid 01/19/2019   Unstable angina (HCC) 01/19/2019   Anxiety disorder 10/29/2018   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   History of parathyroidectomy 10/29/2018   Immunosuppressive management encounter following kidney transplant 10/29/2018   Other chronic sinusitis 10/29/2018   Right lower quadrant abdominal pain 10/29/2018   Secondary hypertension due to renal disease 10/29/2018   Transplanted organ removal status 10/29/2018   Kidney transplant recipient 10/29/2018   Yeast dermatitis 08/14/2018   Plantar fasciitis 07/03/2018   Sesamoiditis 07/03/2018   AIN grade II 06/26/2018   Pruritus ani 06/26/2018   Perianal lesion 04/22/2018   Bloating 04/16/2018   Irregular uterine bleeding 02/13/2016   Iron deficiency anemia 10/19/2015   Mechanical complication of other vascular device, implant, and graft 09/02/2012   End stage renal disease (HCC) 09/02/2012   Complications due to cardiac device, implant, and graft 09/02/2012   DVT of axillary vein, acute left (HCC) 08/02/2012   S/p cadaver renal transplant 08/02/2012   Phlebitis and thrombophlebitis of other sites 08/02/2012   PCP:  [name], MD Pharmacy:   [facility] [id number] [place], [place] - [address] AT [facility] [address] [place] [address] Phone: [phone] Fax: [phone]     Social Drivers of Health  (SDOH) Social History: SDOH Screenings   Food Insecurity: Low Risk (02/05/2024)   Received from [facility]  Housing: Unknown (05/14/2024)   Received from [facility]  Transportation Needs: No Transportation Needs (02/05/2024)   Received from [facility]  Utilities: Low Risk (02/05/2024)   Received from [facility]  Depression (PHQ2-9): Low Risk (04/28/2024)  Financial Resource Strain: High Risk (10/16/2021)   Received from [facility]  Physical Activity: Inactive (10/16/2021)   Received from [facility]  Social Connections: Moderately Isolated (10/16/2021)   Received from [facility]  Stress: Stress Concern Present (10/16/2021)   Received from [facility]  Tobacco Use: Low Risk (08/17/2024)   SDOH Interventions:     Readmission Risk Interventions     No data to display

## 2024-08-18 NOTE — ED Notes (Signed)
 Pt found wondering in room by self attempting to rip stuff off of the wall. When staff assisted pt back to bed, pt began to be verbally and physically aggressive towards staff. EDP made aware.

## 2024-08-19 ENCOUNTER — Other Ambulatory Visit (HOSPITAL_COMMUNITY): Payer: Self-pay

## 2024-08-19 DIAGNOSIS — G9340 Encephalopathy, unspecified: Secondary | ICD-10-CM | POA: Diagnosis not present

## 2024-08-19 LAB — COMPREHENSIVE METABOLIC PANEL WITH GFR
ALT: <5 U/L (ref 0–44)
AST: 12 U/L — ABNORMAL LOW (ref 15–41)
Albumin: 3.3 g/dL — ABNORMAL LOW (ref 3.5–5.0)
Alkaline Phosphatase: 61 U/L (ref 38–126)
Anion gap: 18 — ABNORMAL HIGH (ref 5–15)
BUN: 52 mg/dL — ABNORMAL HIGH (ref 6–20)
CO2: 20 mmol/L — ABNORMAL LOW (ref 22–32)
Calcium: 8.9 mg/dL (ref 8.9–10.3)
Chloride: 97 mmol/L — ABNORMAL LOW (ref 98–111)
Creatinine, Ser: 11.4 mg/dL — ABNORMAL HIGH (ref 0.44–1.00)
GFR, Estimated: 4 mL/min — ABNORMAL LOW (ref >60)
Glucose, Bld: 67 mg/dL — ABNORMAL LOW (ref 70–99)
Potassium: 5.3 mmol/L — ABNORMAL HIGH (ref 3.5–5.1)
Sodium: 135 mmol/L (ref 135–145)
Total Bilirubin: 0.3 mg/dL (ref 0.0–1.2)
Total Protein: 6 g/dL — ABNORMAL LOW (ref 6.5–8.1)

## 2024-08-19 LAB — CBC
HCT: 25.8 % — ABNORMAL LOW (ref 36.0–46.0)
Hemoglobin: 8.2 g/dL — ABNORMAL LOW (ref 12.0–15.0)
MCH: 30.5 pg (ref 26.0–34.0)
MCHC: 31.8 g/dL (ref 30.0–36.0)
MCV: 95.9 fL (ref 80.0–100.0)
Platelets: 230 K/uL (ref 150–400)
RBC: 2.69 MIL/uL — ABNORMAL LOW (ref 3.87–5.11)
RDW: 15.3 % (ref 11.5–15.5)
WBC: 5.1 K/uL (ref 4.0–10.5)
nRBC: 0 % (ref 0.0–0.2)

## 2024-08-19 LAB — PROTIME-INR
INR: 1.1 (ref 0.8–1.2)
Prothrombin Time: 15.3 s — ABNORMAL HIGH (ref 11.4–15.2)

## 2024-08-19 MED ORDER — ANTICOAGULANT SODIUM CITRATE 4% (200MG/5ML) IV SOLN: 5.0000 mL | Status: DC | PRN

## 2024-08-19 MED ORDER — ALTEPLASE 2 MG IJ SOLR: 2.0000 mg | Freq: Once | INTRAMUSCULAR | Status: DC | PRN

## 2024-08-19 MED ORDER — HEPARIN SODIUM (PORCINE) 1000 UNIT/ML DIALYSIS
1000.0000 [IU] | INTRAMUSCULAR | Status: AC | PRN
  Administered 2024-08-19: 10:00:00 1000 [IU] via INTRAVENOUS_CENTRAL

## 2024-08-19 MED ORDER — DOXYCYCLINE HYCLATE 100 MG PO TABS
100.0000 mg | ORAL_TABLET | Freq: Two times a day (BID) | ORAL | 0 refills | Status: AC
  Filled 2024-08-19: qty 8, 4d supply, fill #0

## 2024-08-19 MED ORDER — LIDOCAINE HCL (PF) 1 % IJ SOLN: 5.0000 mL | INTRAMUSCULAR | Status: DC | PRN

## 2024-08-19 MED ORDER — LIDOCAINE-PRILOCAINE 2.5-2.5 % EX CREA: 1.0000 | TOPICAL_CREAM | CUTANEOUS | Status: DC | PRN

## 2024-08-19 MED ORDER — PENTAFLUOROPROP-TETRAFLUOROETH EX AERO: 1.0000 | INHALATION_SPRAY | CUTANEOUS | Status: DC | PRN

## 2024-08-19 MED ORDER — AMOXICILLIN-POT CLAVULANATE 500-125 MG PO TABS
1.0000 | ORAL_TABLET | Freq: Two times a day (BID) | ORAL | 0 refills | Status: AC
  Filled 2024-08-19: qty 8, 4d supply, fill #0

## 2024-08-19 MED ORDER — HEPARIN SODIUM (PORCINE) 1000 UNIT/ML DIALYSIS
2000.0000 [IU] | Freq: Once | INTRAMUSCULAR | Status: AC
  Administered 2024-08-19: 09:00:00 2000 [IU] via INTRAVENOUS_CENTRAL

## 2024-08-19 MED ORDER — DIPHENHYDRAMINE HCL 50 MG/ML IJ SOLN
25.0000 mg | Freq: Once | INTRAMUSCULAR | Status: AC
  Administered 2024-08-19: 12:00:00 25 mg via INTRAVENOUS

## 2024-08-19 MED ORDER — NEPRO/CARBSTEADY PO LIQD: 237.0000 mL | ORAL | Status: DC | PRN

## 2024-08-19 MED ORDER — HEPARIN SODIUM (PORCINE) 1000 UNIT/ML DIALYSIS: 1000.0000 [IU] | INTRAMUSCULAR | Status: DC | PRN

## 2024-08-19 MED FILL — Diphenhydramine HCl Inj 50 MG/ML: INTRAMUSCULAR | Qty: 1 | Status: AC

## 2024-08-19 MED FILL — Heparin Sodium (Porcine) Inj 1000 Unit/ML: INTRAMUSCULAR | Qty: 3 | Status: AC

## 2024-08-19 NOTE — Plan of Care (Signed)

## 2024-08-19 NOTE — Progress Notes (Signed)
 Pt receives out-pt HD at [facility], MWF, 0645am chair time. Will continue to assist as needed.    Lavanda Trypp Heckmann  Dialysis Navigator [phone]

## 2024-08-19 NOTE — Evaluation (Signed)
 Physical Therapy Brief Evaluation and Discharge Note Patient Details Name: Ariel [name] MRN: [id number] DOB: 04-09-1976 Today's Date: 08/19/2024   History of Present Illness  Pt is a 48 y.o. F who presents 08/17/2024 with acute metabolic encephalopathy. Appears to be polypharmacy and PNA. CT head normal. Recent L TKA. PMH includes ESRD, Anxiety, HTN, hypothyroidism, kidney transplant.  Clinical Impression  Pt admitted with above. Pt seems to be close to her functional baseline. Able to perform lower body ADL's in standing. Ambulating 200 ft with a RW with step through pattern and negotiated 1 step with a walker to simulate home entrance. Pt does display deficits in L terminal knee extension and gait abnormalities including circumduction during swing phase and malalignment in stance phase. Pt would benefit from follow up OPPT to address L knee ROM, strengthening and gait pattern and mechanics. She is scheduled for an appointment with [facility] on 12/23. Encouraged her to perform exercises from previously given packet 2-3 times a day. Pt verbalized understanding. No further acute PT needs.       PT Assessment Patient does not need any further PT services  Assistance Needed at Discharge  PRN    Equipment Recommendations None recommended by PT  Recommendations for Other Services       Precautions/Restrictions Precautions Precautions: None Restrictions Weight Bearing Restrictions Per Provider Order: No        Mobility  Bed Mobility   Supine/Sidelying to sit: Modified independent (Device/Increased time)      Transfers Overall transfer level: Modified independent Equipment used: Rolling walker (2 wheels)                    Ambulation/Gait Ambulation/Gait assistance: Modified independent (Device/Increase time) Gait Distance (Feet): 200 Feet Assistive device: Rolling walker (2 wheels) Gait Pattern/deviations: Step-through pattern, Decreased dorsiflexion -  left Gait Speed: Below normal General Gait Details: Verbal cues for neutral L foot positioning and heel strike at initial contact. Circumduction in swing phase  Home Activity Instructions    Stairs Stairs: Yes Stairs assistance: Supervision Stair Management: With walker Number of Stairs: 1 General stair comments: Ascended backwards with walker, descended forward. cues for sequencing/technique  Modified Rankin (Stroke Patients Only)        Balance Overall balance assessment: No apparent balance deficits (not formally assessed)                        Pertinent Vitals/Pain PT - Brief Vital Signs All Vital Signs Stable: Yes Pain Assessment Pain Assessment: No/denies pain     Home Living Family/patient expects to be discharged to:: Private residence Living Arrangements: Parent Available Help at Discharge: Family Home Environment: Stairs to enter  [place] of Steps: 2 Home Equipment: Agricultural Consultant (2 wheels);Wheelchair - manual;BSC/3in1        Prior Function Level of Independence: Independent with assistive device(s) Comments: Typically drives self to HD    UE/LE Assessment   UE ROM/Strength/Tone/Coordination: WFL    LE ROM/Strength/Tone/Coordination: Impaired LE ROM/Strength/Tone/Coordination Deficits: Recent L TKA. Appears lacking in terminal knee extension    Communication   Communication Communication: No apparent difficulties     Cognition Overall Cognitive Status: Appears within functional limits for tasks assessed/performed       General Comments      Exercises     Assessment/Plan    PT Problem List         PT Visit Diagnosis Other abnormalities of gait and mobility (R26.89)  No Skilled PT All education completed;Patient at baseline level of functioning;Patient is modified independent with all activity/mobility   Co-evaluation                AMPAC 6 Clicks Help needed turning from your back to your side while in  a flat bed without using bedrails?: None Help needed moving from lying on your back to sitting on the side of a flat bed without using bedrails?: None Help needed moving to and from a bed to a chair (including a wheelchair)?: None Help needed standing up from a chair using your arms (e.g., wheelchair or bedside chair)?: None Help needed to walk in hospital room?: A Little Help needed climbing 3-5 steps with a railing? : A Little 6 Click Score: 22      End of Session Equipment Utilized During Treatment: Gait belt Activity Tolerance: Patient tolerated treatment well Patient left: in bed;with call bell/phone within reach Nurse Communication: Mobility status PT Visit Diagnosis: Other abnormalities of gait and mobility (R26.89)     Time: 8456-8442 PT Time Calculation (min) (ACUTE ONLY): 14 min  Charges:   PT Evaluation $PT Eval Low Complexity: 1 Low      Aleck Daring, PT, DPT Acute Rehabilitation Services Office [phone]   Aleck ONEIDA Daring  08/19/2024, 4:09 PM

## 2024-08-19 NOTE — Progress Notes (Signed)
 Patient is being discharged home and mother is coming to pick her up. This nurse gave discharge instructions and she verbalized understanding and all of her questions were answered. TOC medications are ready and will be picked up on the way out of the facility. This nurse removed the IV with no issues.   Saria Haran BSN RN

## 2024-08-19 NOTE — Progress Notes (Signed)
°   08/19/24 1250  Vitals  Temp 98 F (36.7 C)  Pulse Rate 91  Resp 15  BP (!) 153/85  SpO2 99 %  O2 Device Room Air  Weight 74.3 kg  Post Treatment  Dialyzer Clearance Clear  Hemodialysis Intake (mL) 0 mL  Liters Processed 68.2  Fluid Removed (mL) 2500 mL  Tolerated HD Treatment Yes  AVG/AVF Arterial Site Held (minutes) 6 minutes  AVG/AVF Venous Site Held (minutes) 6 minutes

## 2024-08-19 NOTE — Discharge Summary (Signed)
 Physician Discharge Summary   Patient: Ariel [name] MRN: [id number] DOB: 1976-01-15  Admit date:     08/17/2024  Discharge date: 08/19/2024  Discharge Physician: Lonni SHAUNNA Dalton   PCP: [name], MD     Recommendations at discharge:  Follow up with PCP Dr. Jama within 1 week for pneumonia and encephalopathy Follow up with Dr. Rubie as directed for knee Dr. Jama: Resume gabapentin  if appropriate     Discharge Diagnoses: Principal Problem:   Acute encephalopathy Active Problems:   Pneumonia   Anxiety disorder   HTN (hypertension)   Depression   ESRD (end stage renal disease) [facility])        Hospital Course: 48 y.o. 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     Acute toxic and metabolic encephalopathy due to polypharmacy and pneumonia Presented with confusion, slowed responses, disorientation, memory loss.    Xanax , oxycodone , gabapentin , trazodone  held, mentation resolved.  EEG negative.  TSH, ammonia, RPR, B12 all normal.  CT head unremarkable.  Given resolution of symptoms off of Xanax  and oxycodone , lack of neurological symptoms, MRI brain deferred.  At the time of discharge, she had returned to her normal cognitive function.  No memory loss or confusion.   Recent knee replacement Follow-up with Dr. Rubie soon as able, continue home therapy plan.  Pneumonia left lower lobe Chest x-ray showed left lower lobe pneumonia, procalcitonin and white blood cell count elevated.  Started on Unasyn .    Patient now mentating at baseline, taking orals.  Temp < 100 F, heart rate < 100bpm, RR < 24, SpO2 at baseline.   Stable for discharge on Augmentin  500-125 BID and doxycycline  100 BID for 4 more days.        The [place]  Controlled Substances Registry was reviewed for this patient prior to  discharge.  Consultants: Nephrology for routine HD   Disposition: Home Diet recommendation:  Renal diet  DISCHARGE MEDICATION: Allergies as of 08/19/2024       Reactions   Sulfamethoxazole Anaphylaxis   Ace Inhibitors Other (See Comments)   Due to Kidney Transplant   Ambien [zolpidem] Other (See Comments)   Sleep walking, hallucinations   Amlodipine Swelling   Hydrogen Peroxide Rash   Keflex [cephalexin] Hives, Swelling   Per pt, her face and lips swell up and she develops hives   Labetalol  Hives, Rash   Orphenadrine Hives   Wound Dressing Adhesive Hives   Adhesive [tape] Rash   Can not use for long periods of time (3 days or more)   Hyoscyamine Other (See Comments)   Dizziness   Imuran [azathioprine Sodium] Rash   Neosporin [bacitracin-polymyxin B] Rash, Other (See Comments)   Can use that for awhile, but will eventually develop a rash    Tramadol Rash   Warfarin Sodium  Rash        Medication List     PAUSE taking these medications    gabapentin  300 MG capsule Wait to take this until your doctor or other care provider tells you to start again. Commonly known as: NEURONTIN  Take 300 mg by mouth at bedtime.   Renvela  800 MG tablet Wait to take this until your doctor or other care provider tells you to start again. Generic drug: sevelamer  carbonate Take 800 mg by mouth 3 (three) times daily with meals.  STOP taking these medications    methocarbamol 500 MG tablet Commonly known as: ROBAXIN       TAKE these medications    acetaminophen  500 MG tablet Commonly known as: TYLENOL  Take 1-2 tablets (500-1,000 mg total) by mouth every 6 (six) hours as needed for headache or moderate pain (pain score 4-6). What changed: how much to take   allopurinol  100 MG tablet Commonly known as: ZYLOPRIM  Take 100 mg by mouth daily.   ALPRAZolam  1 MG tablet Commonly known as: XANAX  Take 1 mg by mouth at bedtime.   amLODipine 10 MG tablet Commonly known as:  NORVASC Take 10 mg by mouth daily.   amoxicillin -clavulanate 500-125 MG tablet Commonly known as: Augmentin  Take 1 tablet by mouth in the morning and at bedtime.   aspirin  EC 81 MG tablet Take 1 tablet (81 mg total) by mouth 2 (two) times daily.   carvedilol  12.5 MG tablet Commonly known as: COREG  Take 12.5 mg by mouth 2 (two) times daily. Holds if systolic BP is 110   Cozaar  50 MG tablet Generic drug: losartan  Take 50 mg by mouth 2 (two) times daily. Hold for systolic BP of 110   docusate sodium 100 MG capsule Commonly known as: COLACE Take 100 mg by mouth 2 (two) times daily as needed for mild constipation or moderate constipation.   doxycycline  100 MG tablet Commonly known as: VIBRA -TABS Take 1 tablet (100 mg total) by mouth 2 (two) times daily.   famotidine  20 MG tablet Commonly known as: PEPCID  Take 20 mg by mouth daily.   furosemide 80 MG tablet Commonly known as: LASIX Take 80 mg by mouth 3 (three) times a week. THS - after dialysis   HM MAGNESIUM  PO Take 1 capsule by mouth daily at 12 noon.   hydrALAZINE  25 MG tablet Commonly known as: APRESOLINE  Take 1 tablet (25 mg total) by mouth 3 (three) times daily.   meclizine  25 MG tablet Commonly known as: ANTIVERT  Take 25 mg by mouth 2 (two) times daily as needed for dizziness or nausea.   medroxyPROGESTERone  10 MG tablet Commonly known as: PROVERA  Take 10 mg by mouth daily.   METAMUCIL PO Take 1 capsule by mouth daily.   mupirocin ointment 2 % Commonly known as: BACTROBAN Apply 1 Application topically 2 (two) times daily.   nitroGLYCERIN  0.4 MG SL tablet Commonly known as: NITROSTAT  Place 1 tablet (0.4 mg total) under the tongue every 5 (five) minutes as needed for chest pain.   ondansetron  4 MG disintegrating tablet Commonly known as: ZOFRAN -ODT Take 4 mg by mouth every 8 (eight) hours as needed for nausea, refractory nausea / vomiting or vomiting.   oxyCODONE  5 MG immediate release tablet Commonly  known as: Oxy IR/ROXICODONE  Take 5-10 mg by mouth every 4 (four) hours as needed.   pantoprazole  40 MG tablet Commonly known as: PROTONIX  TAKE 1 TABLET(40 MG) BY MOUTH DAILY   sertraline  50 MG tablet Commonly known as: ZOLOFT  Take 50 mg by mouth at bedtime.   simvastatin  5 MG tablet Commonly known as: ZOCOR  Take 5 mg by mouth at bedtime.   traZODone  100 MG tablet Commonly known as: DESYREL  Take 100 mg by mouth at bedtime.               Discharge Care Instructions  (From admission, onward)           Start     Ordered   08/19/24 0000  Discharge wound care:  Comments: Continue all wound care as directed by Dr. Rubie   08/19/24 1510            Follow-up Information     [name], MD. Schedule an appointment as soon as possible for a visit in 1 week(s).   Specialty: Internal Medicine Contact information: [address] [place] [address] [phone]         Rubie Kemps, MD Follow up.   Specialty: Orthopedic Surgery Contact information: [address] [place] [address] [phone]                 Discharge Instructions     Discharge instructions   Complete by: As directed    **IMPORTANT DISCHARGE INSTRUCTIONS**   From Dr. Jonel: You were admitted for confusion  Here, we found that this was from pneumonia and probably too many sedating medicines  For the next few days, take only HALF a Xanax  at night  (In other words, at night, take only alprazolam /Xanax  0.5 mg before bed)  Space Xanax  and oxycodone  apart  Use the least amount of oxycodone  you can (try half a tablet first, then increase if needed)  STOP methocarbamol/Robaxin (you don't need the muscle relaxer any more)  HOLD your gabapentin  for now, until you see your primary doctor in 1 week  For the pneumonia, take the antibiotics Augmentin  and doxycycline  for 4 more days starting tonight Take Augmentin  500-125 mg twice daily Take doxycycline   100 mg twice daily  Call your primary doctor to get checked out in 1 week   Discharge wound care:   Complete by: As directed    Continue all wound care as directed by Dr. Rubie   Increase activity slowly   Complete by: As directed        Discharge Exam: Filed Weights   08/18/24 2353 08/19/24 0856 08/19/24 1250  Weight: 82.1 kg 76.5 kg 74.3 kg    General: Pt is alert, awake, not in acute distress Cardiovascular: RRR, nl S1-S2, no murmurs appreciated.   No LE edema.   Respiratory: Normal respiratory rate and rhythm.  CTAB without rales or wheezes. Abdominal: Abdomen soft and non-tender.  No distension or HSM.   Neuro/Psych: Strength symmetric in upper and lower extremities.  Judgment and insight appear normal.   Condition at discharge: good  The results of significant diagnostics from this hospitalization (including imaging, microbiology, ancillary and laboratory) are listed below for reference.   Imaging Studies: EEG adult Result Date: 08/18/2024 Shelton Arlin KIDD, MD     08/18/2024  7:32 PM Patient Name: Sativa Gelles MRN: [id number] Epilepsy Attending: Arlin KIDD Shelton Referring Physician/Provider: Jonel Lonni SQUIBB, MD Date: 08/18/2024 Duration: 23.12 mins Patient history: 48yo F with ams. EEG to evaluate for seizure Level of alertness: Awake AEDs during EEG study: None Technical aspects: This EEG study was done with scalp electrodes positioned according to the 10-20 International system of electrode placement. Electrical activity was reviewed with band pass filter of 1-70Hz , sensitivity of 7 uV/mm, display speed of 55mm/sec with a 60Hz  notched filter applied as appropriate. EEG data were recorded continuously and digitally stored.  Video monitoring was available and reviewed as appropriate. Description: EEG showed continuous generalized 3 to 6 Hz theta-delta slowing. Physiologic photic driving was not seen during photic stimulation. Hyperventilation was not performed.    ABNORMALITY - Continuous slow, generalized IMPRESSION: This study is suggestive of  generalized cerebral dysfunction ( encephalopathy). No seizures or epileptiform discharges were seen throughout  the recording.  Arlin MALVA Krebs   DG Chest Portable 1 View Result Date: 08/17/2024 EXAM: 1 VIEW(S) XRAY OF THE CHEST 08/17/2024 07:32:00 PM COMPARISON: None available. CLINICAL HISTORY: ams FINDINGS: LINES, TUBES AND DEVICES: Right subclavian vascular stent noted. LUNGS AND PLEURA: Left basilar airspace opacity. Small left pleural effusion. No pneumothorax. HEART AND MEDIASTINUM: Cardiomegaly. BONES AND SOFT TISSUES: Cervical fusion hardware noted. IMPRESSION: 1. Left basilar airspace opacity with a small left pleural effusion, which may reflect aspiration or pneumonia. 2. Cardiomegaly. Electronically signed by: Greig Pique MD 08/17/2024 07:39 PM EST RP Workstation: [id number]   CT HEAD WO CONTRAST Result Date: 08/17/2024 EXAM: CT HEAD WITHOUT 08/17/2024 02:46:34 PM TECHNIQUE: CT of the head was performed without the administration of intravenous contrast. Automated exposure control, iterative reconstruction, and/or weight based adjustment of the mA/kV was utilized to reduce the radiation dose to as low as reasonably achievable. COMPARISON: MRI brain 01/09/2020. CLINICAL HISTORY: Memory loss; Mental status change, unknown cause. FINDINGS: BRAIN AND VENTRICLES: No acute intracranial hemorrhage. No mass effect or midline shift. No extra-axial fluid collection. No evidence of acute infarct. No hydrocephalus. ORBITS: No acute abnormality. SINUSES AND MASTOIDS: Left maxillary sinus opacification. Extension through medial left maxillary sinus wall, suggestive of mucocele. SOFT TISSUES AND SKULL: No acute skull fracture. No acute soft tissue abnormality. IMPRESSION: 1. No acute intracranial abnormality. 2. Left maxillary sinus opacification with extension through the medial left maxillary sinus wall, suggestive of a  mucocele. Electronically signed by: Ryan Chess MD 08/17/2024 02:57 PM EST RP Workstation: [id number]    Microbiology: Results for orders placed or performed during the hospital encounter of 08/17/24  Resp panel by RT-PCR (RSV, Flu A&B, Covid) Anterior Nasal Swab     Status: None   Collection Time: 08/17/24 11:45 PM   Specimen: Anterior Nasal Swab  Result Value Ref Range Status   SARS Coronavirus 2 by RT PCR NEGATIVE NEGATIVE Final   Influenza A by PCR NEGATIVE NEGATIVE Final   Influenza B by PCR NEGATIVE NEGATIVE Final    Comment: (NOTE) The Xpert Xpress SARS-CoV-2/FLU/RSV plus assay is intended as an aid in the diagnosis of influenza from Nasopharyngeal swab specimens and should not be used as a sole basis for treatment. Nasal washings and aspirates are unacceptable for Xpert Xpress SARS-CoV-2/FLU/RSV testing.  Fact Sheet for Patients: bloggercourse.com  Fact Sheet for Healthcare Providers: seriousbroker.it  This test is not yet approved or cleared by the [place]  FDA and has been authorized for detection and/or diagnosis of SARS-CoV-2 by FDA under an Emergency Use Authorization (EUA). This EUA will remain in effect (meaning this test can be used) for the duration of the COVID-19 declaration under Section 564(b)(1) of the Act, 21 U.S.C. section 360bbb-3(b)(1), unless the authorization is terminated or revoked.     Resp Syncytial Virus by PCR NEGATIVE NEGATIVE Final    Comment: (NOTE) Fact Sheet for Patients: bloggercourse.com  Fact Sheet for Healthcare Providers: seriousbroker.it  This test is not yet approved or cleared by the [place]  FDA and has been authorized for detection and/or diagnosis of SARS-CoV-2 by FDA under an Emergency Use Authorization (EUA). This EUA will remain in effect (meaning this test can be used) for the duration of the COVID-19  declaration under Section 564(b)(1) of the Act, 21 U.S.C. section 360bbb-3(b)(1), unless the authorization is terminated or revoked.  Performed at [facility], [address] [place], [place] [address]     Labs: CBC: Recent Labs  Lab 08/17/24 1407 08/17/24 2349  08/18/24 0534 08/19/24 0149  WBC 6.7  --  5.5 5.1  HGB 9.2* 11.2* 8.4* 8.2*  HCT 29.6* 33.0* 26.4* 25.8*  MCV 95.8  --  95.7 95.9  PLT 228  --  223 230   Basic Metabolic Panel: Recent Labs  Lab 08/17/24 1407 08/17/24 2349 08/18/24 0534 08/19/24 0149  NA 134* 131* 136 135  K 4.9 4.8 4.9 5.3*  CL 97*  --  99 97*  CO2 22  --  21* 20*  GLUCOSE 73  --  90 67*  BUN 37*  --  44* 52*  CREATININE 9.26*  --  10.41* 11.40*  CALCIUM 8.8*  --  8.6* 8.9   Liver Function Tests: Recent Labs  Lab 08/17/24 1407 08/19/24 0149  AST 11* 12*  ALT 9 <5  ALKPHOS 56 61  BILITOT 1.4* 0.3  PROT 6.6 6.0*  ALBUMIN 3.1* 3.3*   CBG: Recent Labs  Lab 08/17/24 1933  GLUCAP 84    Discharge time spent: approximately 45 minutes spent on discharge counseling, evaluation of patient on day of discharge, and coordination of discharge planning with nursing, social work, pharmacy and case management  Signed: Lonni SHAUNNA Dalton, MD [facility] 08/19/2024

## 2024-08-19 NOTE — Evaluation (Signed)
 Occupational Therapy Evaluation Patient Details Name: Ariel [name] MRN: [id number] DOB: 20-Feb-1976 Today's Date: 08/19/2024   History of Present Illness   Pt is a 48 y.o. F who presents 08/17/2024 with acute metabolic encephalopathy. Appears to be polypharmacy and PNA. CT head normal. Recent L TKA. PMH includes ESRD, Anxiety, HTN, hypothyroidism, kidney transplant.     Clinical Impressions Pt with problem above with deficits listed below. Grossly mod I for OOB ADL and mobility. Pt scoring an 8 on the short blessed test of cognition indicating questionable impairment. Pt with awareness of errors made during testing. Agreeable to have mother double check that she is not making errors with medication management or keeping up with appointments. Suspect cognition to continue to improve with time and transition back to home setting. No further needs identified with pt scheduled to discharge after OT eval.      If plan is discharge home, recommend the following:   Direct supervision/assist for financial management;Direct supervision/assist for medications management (encouraged pt to have mom double check her medication management and help her keep up with appts initially)     Functional Status Assessment   Patient has had a recent decline in their functional status and demonstrates the ability to make significant improvements in function in a reasonable and predictable amount of time.     Equipment Recommendations   None recommended by OT     Recommendations for Other Services         Precautions/Restrictions   Precautions Precautions: None Recall of Precautions/Restrictions: Intact Restrictions Weight Bearing Restrictions Per Provider Order: No     Mobility Bed Mobility                    Transfers Overall transfer level: Modified independent Equipment used: Rolling walker (2 wheels)                      Balance                                            ADL either performed or assessed with clinical judgement   ADL Overall ADL's : Modified independent                                             Vision         Perception         Praxis         Pertinent Vitals/Pain Pain Assessment Pain Assessment: No/denies pain     Extremity/Trunk Assessment Upper Extremity Assessment Upper Extremity Assessment: Defer to OT evaluation;Overall [facility] for tasks assessed   Lower Extremity Assessment Lower Extremity Assessment: Defer to PT evaluation   Cervical / Trunk Assessment Cervical / Trunk Assessment: Normal   Communication Communication Communication: No apparent difficulties   Cognition Arousal: Alert Behavior During Therapy: WFL for tasks assessed/performed Cognition: Cognition impaired       Memory impairment (select all impairments): Short-term memory, Working Biochemist, Clinical functioning impairment (select all impairments): Problem solving OT - Cognition Comments: scoring an 8 on the short blessed test indicating questionable cognitive impairment.                 Following commands: Intact  Cueing  General Comments   Cueing Techniques: Verbal cues      Exercises     Shoulder Instructions      Home Living Family/patient expects to be discharged to:: Private residence Living Arrangements: Parent Available Help at Discharge: Family;Available 24 hours/day Type of Home: House Home Access: Stairs to enter [employer] of Steps: 2 Entrance Stairs-Rails: None Home Layout: Two level;1/2 bath on main level     Bathroom Shower/Tub: Chief Strategy Officer: Standard     Home Equipment: Agricultural Consultant (2 wheels);Wheelchair - manual;BSC/3in1          Prior Functioning/Environment Prior Level of Function : Independent/Modified Independent                    OT Problem List: Decreased strength;Impaired balance  (sitting and/or standing);Decreased activity tolerance;Decreased cognition   OT Treatment/Interventions:        OT Goals(Current goals can be found in the care plan section)   Acute Rehab OT Goals Patient Stated Goal: get better OT Goal Formulation: With patient   OT Frequency:       Co-evaluation              AM-PAC OT 6 Clicks Daily Activity     Outcome Measure Help from another person eating meals?: None Help from another person taking care of personal grooming?: None Help from another person toileting, which includes using toliet, bedpan, or urinal?: None Help from another person bathing (including washing, rinsing, drying)?: None Help from another person to put on and taking off regular upper body clothing?: None Help from another person to put on and taking off regular lower body clothing?: None 6 Click Score: 24   End of Session Nurse Communication: Mobility status  Activity Tolerance: Patient tolerated treatment well Patient left: in bed;with call bell/phone within reach  OT Visit Diagnosis: Unsteadiness on feet (R26.81);Muscle weakness (generalized) (M62.81);Other symptoms and signs involving cognitive function                Time: 8392-8381 OT Time Calculation (min): 11 min Charges:  OT General Charges $OT Visit: 1 Visit OT Evaluation $OT Eval Low Complexity: 1 Low  Elma JONETTA Lebron FREDERICK, OTR/L [facility] Acute Rehabilitation Office: [phone]   Elma JONETTA Lebron 08/19/2024, 4:24 PM

## 2024-08-19 NOTE — Progress Notes (Signed)
 Pt. Came in awake and oriented and with no complaints. Consent signed and on file. Tx started with no complications.   UF removed: Tx Duration: 3.25 hours  Access used: Right AVF Access Issue: None  Complained of itchiness. See MAR  Tx completed and tolerated. Pressure Dressing applied Endorsed to floor nurse. Transported to room.  Suzzanne Brunkhorst Rubi Deshannon Hinchliffe, RN [facility]

## 2024-08-20 LAB — HEPATITIS B SURFACE ANTIBODY, QUANTITATIVE: Hep B S AB Quant (Post): 58.8 m[IU]/mL

## 2024-08-20 NOTE — Progress Notes (Addendum)
 Late note entry 12/18 0948am  Dc noted. Contacted out-pt HD clinic [facility] to inform of pt d/c and anticipated arrival Friday as scheduled. Noted that pt was day of discharge. No further support needed.  Lavanda Ayson Cherubini Dialysis Navigator [phone]

## 2024-08-20 NOTE — Discharge Planning (Signed)
 [facility] Patient Discharge Orders- [facility] CLINIC: [facility]  Patient's name: Ariel [name] Admit/DC Dates: 08/17/2024 - 08/19/2024  Discharge Diagnoses: Acute metabolic encephalopathy: 2nd PNA and polypharmacy. Xanax , oxycodone , gabapentin , trazodone  held.   Aranesp : Given: No    Last Hgb: 8.2 PRBC's Given: No  ESA dose for discharge: mircera 100 mcg IV q 2 weeks  IV Iron dose at discharge: Resume weekly Venofer   Heparin  change: No  EDW Change: No  Bath Change: Yes: change to 2K bath and continue 2.5Ca bath  Access intervention/Change: No  Calcitriol  change: No  Discharge Labs: Calcium 8.9  Albumin 3.3  K+ 5.3  IV Antibiotics: Yes, on PO Augmentin  for LLL PNA  On Coumadin ?: No   OTHER/APPTS/LAB ORDERS:  Check K+ level pre-HD at next treatment    D/C Meds to be reconciled by nurse after every discharge.  Completed By: Charmaine Piety, NP   Reviewed by: MD:______ RN_______

## 2024-08-21 LAB — VITAMIN B1: Vitamin B1 (Thiamine): 99.3 nmol/L (ref 66.5–200.0)

## 2025-06-10 ENCOUNTER — Encounter (INDEPENDENT_AMBULATORY_CARE_PROVIDER_SITE_OTHER): Admitting: Ophthalmology
